# Patient Record
Sex: Male | Born: 1935 | Race: White | Hispanic: No | Marital: Married | Smoking: Former smoker
Health system: Southern US, Community
[De-identification: ages and names within clinical notes are randomized; demographics above are authoritative.]

## PROBLEM LIST (undated history)

## (undated) DIAGNOSIS — Z952 Presence of prosthetic heart valve: Secondary | ICD-10-CM

## (undated) DIAGNOSIS — K219 Gastro-esophageal reflux disease without esophagitis: Secondary | ICD-10-CM

## (undated) DIAGNOSIS — J309 Allergic rhinitis, unspecified: Secondary | ICD-10-CM

## (undated) DIAGNOSIS — E785 Hyperlipidemia, unspecified: Secondary | ICD-10-CM

## (undated) DIAGNOSIS — IMO0001 Reserved for inherently not codable concepts without codable children: Secondary | ICD-10-CM

## (undated) DIAGNOSIS — M949 Disorder of cartilage, unspecified: Secondary | ICD-10-CM

## (undated) DIAGNOSIS — I739 Peripheral vascular disease, unspecified: Secondary | ICD-10-CM

## (undated) DIAGNOSIS — A0472 Enterocolitis due to Clostridium difficile, not specified as recurrent: Secondary | ICD-10-CM

## (undated) DIAGNOSIS — E538 Deficiency of other specified B group vitamins: Secondary | ICD-10-CM

## (undated) DIAGNOSIS — N3091 Cystitis, unspecified with hematuria: Secondary | ICD-10-CM

## (undated) DIAGNOSIS — I6529 Occlusion and stenosis of unspecified carotid artery: Secondary | ICD-10-CM

## (undated) DIAGNOSIS — K449 Diaphragmatic hernia without obstruction or gangrene: Secondary | ICD-10-CM

## (undated) DIAGNOSIS — D649 Anemia, unspecified: Secondary | ICD-10-CM

## (undated) DIAGNOSIS — G459 Transient cerebral ischemic attack, unspecified: Secondary | ICD-10-CM

## (undated) DIAGNOSIS — M899 Disorder of bone, unspecified: Secondary | ICD-10-CM

## (undated) DIAGNOSIS — I10 Essential (primary) hypertension: Secondary | ICD-10-CM

## (undated) DIAGNOSIS — K279 Peptic ulcer, site unspecified, unspecified as acute or chronic, without hemorrhage or perforation: Secondary | ICD-10-CM

## (undated) DIAGNOSIS — B029 Zoster without complications: Secondary | ICD-10-CM

## (undated) DIAGNOSIS — C61 Malignant neoplasm of prostate: Secondary | ICD-10-CM

## (undated) DIAGNOSIS — I639 Cerebral infarction, unspecified: Secondary | ICD-10-CM

## (undated) DIAGNOSIS — F1021 Alcohol dependence, in remission: Secondary | ICD-10-CM

## (undated) DIAGNOSIS — K5730 Diverticulosis of large intestine without perforation or abscess without bleeding: Secondary | ICD-10-CM

## (undated) DIAGNOSIS — R310 Gross hematuria: Secondary | ICD-10-CM

## (undated) DIAGNOSIS — R5084 Febrile nonhemolytic transfusion reaction: Secondary | ICD-10-CM

## (undated) DIAGNOSIS — K649 Unspecified hemorrhoids: Secondary | ICD-10-CM

## (undated) DIAGNOSIS — Z5189 Encounter for other specified aftercare: Secondary | ICD-10-CM

## (undated) DIAGNOSIS — T4145XA Adverse effect of unspecified anesthetic, initial encounter: Secondary | ICD-10-CM

## (undated) DIAGNOSIS — N400 Enlarged prostate without lower urinary tract symptoms: Secondary | ICD-10-CM

## (undated) DIAGNOSIS — I350 Nonrheumatic aortic (valve) stenosis: Secondary | ICD-10-CM

## (undated) DIAGNOSIS — I2510 Atherosclerotic heart disease of native coronary artery without angina pectoris: Secondary | ICD-10-CM

## (undated) HISTORY — DX: Enterocolitis due to Clostridium difficile, not specified as recurrent: A04.72

## (undated) HISTORY — DX: Unspecified hemorrhoids: K64.9

## (undated) HISTORY — DX: Essential (primary) hypertension: I10

## (undated) HISTORY — DX: Occlusion and stenosis of unspecified carotid artery: I65.29

## (undated) HISTORY — DX: Allergic rhinitis, unspecified: J30.9

## (undated) HISTORY — DX: Hyperlipidemia, unspecified: E78.5

## (undated) HISTORY — DX: Presence of prosthetic heart valve: Z95.2

## (undated) HISTORY — DX: Transient cerebral ischemic attack, unspecified: G45.9

## (undated) HISTORY — DX: Peptic ulcer, site unspecified, unspecified as acute or chronic, without hemorrhage or perforation: K27.9

## (undated) HISTORY — DX: Disorder of bone, unspecified: M89.9

## (undated) HISTORY — DX: Diverticulosis of large intestine without perforation or abscess without bleeding: K57.30

## (undated) HISTORY — DX: Peripheral vascular disease, unspecified: I73.9

## (undated) HISTORY — DX: Disorder of cartilage, unspecified: M94.9

## (undated) HISTORY — DX: Alcohol dependence, in remission: F10.21

## (undated) HISTORY — DX: Malignant neoplasm of prostate: C61

## (undated) HISTORY — DX: Gastro-esophageal reflux disease without esophagitis: K21.9

## (undated) HISTORY — DX: Deficiency of other specified B group vitamins: E53.8

## (undated) HISTORY — DX: Anemia, unspecified: D64.9

## (undated) HISTORY — DX: Benign prostatic hyperplasia without lower urinary tract symptoms: N40.0

## (undated) HISTORY — DX: Nonrheumatic aortic (valve) stenosis: I35.0

## (undated) HISTORY — DX: Atherosclerotic heart disease of native coronary artery without angina pectoris: I25.10

## (undated) HISTORY — DX: Zoster without complications: B02.9

## (undated) HISTORY — DX: Diaphragmatic hernia without obstruction or gangrene: K44.9

---

## 1996-01-19 HISTORY — PX: POPLITEAL SYNOVIAL CYST EXCISION: SUR555

## 1997-01-18 HISTORY — PX: CAROTID ENDARTERECTOMY: SUR193

## 1999-01-19 HISTORY — PX: PROSTATE SURGERY: SHX751

## 1999-06-04 ENCOUNTER — Other Ambulatory Visit: Admission: RE | Admit: 1999-06-04 | Discharge: 1999-06-04 | Payer: Self-pay | Admitting: Internal Medicine

## 1999-06-04 ENCOUNTER — Encounter (INDEPENDENT_AMBULATORY_CARE_PROVIDER_SITE_OTHER): Payer: Self-pay

## 1999-07-24 ENCOUNTER — Encounter (INDEPENDENT_AMBULATORY_CARE_PROVIDER_SITE_OTHER): Payer: Self-pay | Admitting: Specialist

## 1999-07-24 ENCOUNTER — Other Ambulatory Visit: Admission: RE | Admit: 1999-07-24 | Discharge: 1999-07-24 | Payer: Self-pay | Admitting: Urology

## 1999-07-31 ENCOUNTER — Encounter: Payer: Self-pay | Admitting: Urology

## 1999-07-31 ENCOUNTER — Encounter: Payer: Self-pay | Admitting: Internal Medicine

## 1999-07-31 ENCOUNTER — Encounter: Admission: RE | Admit: 1999-07-31 | Discharge: 1999-07-31 | Payer: Self-pay | Admitting: Urology

## 1999-08-17 ENCOUNTER — Encounter: Admission: RE | Admit: 1999-08-17 | Discharge: 1999-11-15 | Payer: Self-pay | Admitting: Radiation Oncology

## 1999-09-23 ENCOUNTER — Encounter: Payer: Self-pay | Admitting: Urology

## 1999-09-28 ENCOUNTER — Inpatient Hospital Stay (HOSPITAL_COMMUNITY): Admission: RE | Admit: 1999-09-28 | Discharge: 1999-10-01 | Payer: Self-pay | Admitting: Urology

## 1999-09-28 ENCOUNTER — Encounter (INDEPENDENT_AMBULATORY_CARE_PROVIDER_SITE_OTHER): Payer: Self-pay | Admitting: Specialist

## 2002-01-18 HISTORY — PX: CORONARY ANGIOPLASTY WITH STENT PLACEMENT: SHX49

## 2002-08-21 ENCOUNTER — Encounter: Payer: Self-pay | Admitting: *Deleted

## 2002-08-21 ENCOUNTER — Ambulatory Visit (HOSPITAL_COMMUNITY): Admission: RE | Admit: 2002-08-21 | Discharge: 2002-08-22 | Payer: Self-pay | Admitting: *Deleted

## 2002-11-16 ENCOUNTER — Inpatient Hospital Stay (HOSPITAL_COMMUNITY): Admission: AD | Admit: 2002-11-16 | Discharge: 2002-11-17 | Payer: Self-pay | Admitting: Cardiology

## 2003-03-20 ENCOUNTER — Encounter: Payer: Self-pay | Admitting: Internal Medicine

## 2003-03-27 ENCOUNTER — Encounter: Payer: Self-pay | Admitting: Internal Medicine

## 2003-03-27 ENCOUNTER — Ambulatory Visit (HOSPITAL_COMMUNITY): Admission: RE | Admit: 2003-03-27 | Discharge: 2003-03-27 | Payer: Self-pay | Admitting: Internal Medicine

## 2004-05-27 ENCOUNTER — Ambulatory Visit: Payer: Self-pay | Admitting: Internal Medicine

## 2004-06-08 ENCOUNTER — Ambulatory Visit: Payer: Self-pay

## 2004-11-11 ENCOUNTER — Ambulatory Visit: Admission: RE | Admit: 2004-11-11 | Discharge: 2004-11-23 | Payer: Self-pay | Admitting: Radiation Oncology

## 2004-12-18 ENCOUNTER — Encounter (HOSPITAL_COMMUNITY): Admission: RE | Admit: 2004-12-18 | Discharge: 2005-03-18 | Payer: Self-pay | Admitting: Radiation Oncology

## 2004-12-28 ENCOUNTER — Ambulatory Visit: Admission: RE | Admit: 2004-12-28 | Discharge: 2005-01-12 | Payer: Self-pay | Admitting: Radiation Oncology

## 2005-01-21 ENCOUNTER — Ambulatory Visit: Admission: RE | Admit: 2005-01-21 | Discharge: 2005-03-26 | Payer: Self-pay | Admitting: Radiation Oncology

## 2005-07-06 ENCOUNTER — Ambulatory Visit: Payer: Self-pay | Admitting: Internal Medicine

## 2005-07-20 ENCOUNTER — Ambulatory Visit: Payer: Self-pay

## 2005-07-23 ENCOUNTER — Encounter: Payer: Self-pay | Admitting: Cardiology

## 2005-07-23 ENCOUNTER — Ambulatory Visit: Payer: Self-pay

## 2005-07-24 ENCOUNTER — Ambulatory Visit: Payer: Self-pay | Admitting: Family Medicine

## 2005-07-26 ENCOUNTER — Ambulatory Visit: Payer: Self-pay

## 2005-11-25 ENCOUNTER — Ambulatory Visit: Payer: Self-pay | Admitting: Internal Medicine

## 2005-12-15 ENCOUNTER — Ambulatory Visit: Payer: Self-pay | Admitting: Internal Medicine

## 2006-06-14 ENCOUNTER — Ambulatory Visit: Payer: Self-pay

## 2006-07-08 ENCOUNTER — Ambulatory Visit: Payer: Self-pay | Admitting: Internal Medicine

## 2006-07-08 LAB — CONVERTED CEMR LAB
ALT: 19 units/L (ref 0–40)
AST: 24 units/L (ref 0–37)
Albumin: 3.7 g/dL (ref 3.5–5.2)
Alkaline Phosphatase: 85 units/L (ref 39–117)
BUN: 10 mg/dL (ref 6–23)
Basophils Absolute: 0 10*3/uL (ref 0.0–0.1)
Basophils Relative: 0.1 % (ref 0.0–1.0)
Bilirubin, Direct: 0.1 mg/dL (ref 0.0–0.3)
CO2: 30 meq/L (ref 19–32)
Calcium: 9.4 mg/dL (ref 8.4–10.5)
Chloride: 107 meq/L (ref 96–112)
Cholesterol: 169 mg/dL (ref 0–200)
Creatinine, Ser: 1.1 mg/dL (ref 0.4–1.5)
Eosinophils Absolute: 0.1 10*3/uL (ref 0.0–0.6)
Eosinophils Relative: 2.1 % (ref 0.0–5.0)
GFR calc Af Amer: 83 mL/min
GFR calc non Af Amer: 69 mL/min
Glucose, Bld: 79 mg/dL (ref 70–99)
HCT: 43.4 % (ref 39.0–52.0)
HDL: 50.7 mg/dL (ref >39.0)
Hemoglobin: 14.9 g/dL (ref 13.0–17.0)
LDL Cholesterol: 98 mg/dL (ref 0–99)
Lymphocytes Relative: 23.4 % (ref 12.0–46.0)
MCHC: 34.3 g/dL (ref 30.0–36.0)
MCV: 92.2 fL (ref 78.0–100.0)
Monocytes Absolute: 0.6 10*3/uL (ref 0.2–0.7)
Monocytes Relative: 17.5 % — ABNORMAL HIGH (ref 3.0–11.0)
Neutro Abs: 2 10*3/uL (ref 1.4–7.7)
Neutrophils Relative %: 56.9 % (ref 43.0–77.0)
Platelets: 156 10*3/uL (ref 150–400)
Potassium: 4.3 meq/L (ref 3.5–5.1)
RBC: 4.7 M/uL (ref 4.22–5.81)
RDW: 12.9 % (ref 11.5–14.6)
Sodium: 139 meq/L (ref 135–145)
TSH: 1.45 microintl units/mL (ref 0.35–5.50)
Total Bilirubin: 0.9 mg/dL (ref 0.3–1.2)
Total CHOL/HDL Ratio: 3.3
Total Protein: 7 g/dL (ref 6.0–8.3)
Triglycerides: 101 mg/dL (ref 0–149)
VLDL: 20 mg/dL (ref 0–40)
WBC: 3.5 10*3/uL — ABNORMAL LOW (ref 4.5–10.5)

## 2006-11-09 DIAGNOSIS — E785 Hyperlipidemia, unspecified: Secondary | ICD-10-CM

## 2006-11-09 DIAGNOSIS — I739 Peripheral vascular disease, unspecified: Secondary | ICD-10-CM

## 2006-11-09 DIAGNOSIS — I10 Essential (primary) hypertension: Secondary | ICD-10-CM | POA: Insufficient documentation

## 2006-11-09 DIAGNOSIS — Z8546 Personal history of malignant neoplasm of prostate: Secondary | ICD-10-CM

## 2006-11-09 DIAGNOSIS — I2510 Atherosclerotic heart disease of native coronary artery without angina pectoris: Secondary | ICD-10-CM | POA: Insufficient documentation

## 2007-04-24 ENCOUNTER — Telehealth: Payer: Self-pay | Admitting: Internal Medicine

## 2007-05-31 ENCOUNTER — Encounter: Payer: Self-pay | Admitting: Internal Medicine

## 2007-05-31 ENCOUNTER — Ambulatory Visit: Payer: Self-pay

## 2007-06-02 ENCOUNTER — Ambulatory Visit: Payer: Self-pay | Admitting: Internal Medicine

## 2007-06-02 DIAGNOSIS — R5381 Other malaise: Secondary | ICD-10-CM

## 2007-06-02 DIAGNOSIS — K279 Peptic ulcer, site unspecified, unspecified as acute or chronic, without hemorrhage or perforation: Secondary | ICD-10-CM | POA: Insufficient documentation

## 2007-06-02 DIAGNOSIS — R5383 Other fatigue: Secondary | ICD-10-CM

## 2007-06-02 DIAGNOSIS — M949 Disorder of cartilage, unspecified: Secondary | ICD-10-CM

## 2007-06-02 DIAGNOSIS — Z8679 Personal history of other diseases of the circulatory system: Secondary | ICD-10-CM | POA: Insufficient documentation

## 2007-06-02 DIAGNOSIS — K5730 Diverticulosis of large intestine without perforation or abscess without bleeding: Secondary | ICD-10-CM | POA: Insufficient documentation

## 2007-06-02 DIAGNOSIS — M899 Disorder of bone, unspecified: Secondary | ICD-10-CM | POA: Insufficient documentation

## 2007-06-02 DIAGNOSIS — J309 Allergic rhinitis, unspecified: Secondary | ICD-10-CM | POA: Insufficient documentation

## 2007-06-02 DIAGNOSIS — K219 Gastro-esophageal reflux disease without esophagitis: Secondary | ICD-10-CM

## 2007-06-02 DIAGNOSIS — N400 Enlarged prostate without lower urinary tract symptoms: Secondary | ICD-10-CM

## 2007-06-03 LAB — CONVERTED CEMR LAB
ALT: 16 units/L (ref 0–53)
AST: 22 units/L (ref 0–37)
Albumin: 4.1 g/dL (ref 3.5–5.2)
Alkaline Phosphatase: 89 units/L (ref 39–117)
BUN: 7 mg/dL (ref 6–23)
Basophils Absolute: 0 10*3/uL (ref 0.0–0.1)
Basophils Relative: 1 % (ref 0.0–1.0)
Bilirubin Urine: NEGATIVE
Bilirubin, Direct: 0.2 mg/dL (ref 0.0–0.3)
CO2: 27 meq/L (ref 19–32)
Calcium: 9.5 mg/dL (ref 8.4–10.5)
Chloride: 101 meq/L (ref 96–112)
Cholesterol: 148 mg/dL (ref 0–200)
Creatinine, Ser: 1 mg/dL (ref 0.4–1.5)
Eosinophils Absolute: 0.1 10*3/uL (ref 0.0–0.7)
Eosinophils Relative: 1.9 % (ref 0.0–5.0)
GFR calc Af Amer: 93 mL/min
GFR calc non Af Amer: 77 mL/min
Glucose, Bld: 85 mg/dL (ref 70–99)
HCT: 41.6 % (ref 39.0–52.0)
HDL: 48.6 mg/dL (ref >39.0)
Hemoglobin, Urine: NEGATIVE
Hemoglobin: 14 g/dL (ref 13.0–17.0)
Ketones, ur: NEGATIVE mg/dL
LDL Cholesterol: 78 mg/dL (ref 0–99)
Leukocytes, UA: NEGATIVE
Lymphocytes Relative: 22.2 % (ref 12.0–46.0)
MCHC: 33.7 g/dL (ref 30.0–36.0)
MCV: 93.3 fL (ref 78.0–100.0)
Monocytes Absolute: 0.5 10*3/uL (ref 0.1–1.0)
Monocytes Relative: 14.1 % — ABNORMAL HIGH (ref 3.0–12.0)
Neutro Abs: 1.9 10*3/uL (ref 1.4–7.7)
Neutrophils Relative %: 60.8 % (ref 43.0–77.0)
Nitrite: NEGATIVE
Platelets: 165 10*3/uL (ref 150–400)
Potassium: 4.2 meq/L (ref 3.5–5.1)
RBC: 4.46 M/uL (ref 4.22–5.81)
RDW: 12.8 % (ref 11.5–14.6)
Sodium: 135 meq/L (ref 135–145)
Specific Gravity, Urine: 1.01 (ref 1.000–1.03)
TSH: 0.89 microintl units/mL (ref 0.35–5.50)
Total Bilirubin: 1.1 mg/dL (ref 0.3–1.2)
Total CHOL/HDL Ratio: 3
Total Protein, Urine: NEGATIVE mg/dL
Total Protein: 7.2 g/dL (ref 6.0–8.3)
Triglycerides: 108 mg/dL (ref 0–149)
Urine Glucose: NEGATIVE mg/dL
Urobilinogen, UA: 0.2 (ref 0.0–1.0)
VLDL: 22 mg/dL (ref 0–40)
WBC: 3.2 10*3/uL — ABNORMAL LOW (ref 4.5–10.5)
pH: 7 (ref 5.0–8.0)

## 2007-12-21 ENCOUNTER — Ambulatory Visit: Payer: Self-pay | Admitting: Internal Medicine

## 2008-03-05 ENCOUNTER — Encounter (INDEPENDENT_AMBULATORY_CARE_PROVIDER_SITE_OTHER): Payer: Self-pay | Admitting: *Deleted

## 2008-06-04 ENCOUNTER — Ambulatory Visit: Payer: Self-pay | Admitting: Internal Medicine

## 2008-06-04 DIAGNOSIS — R079 Chest pain, unspecified: Secondary | ICD-10-CM

## 2008-06-05 ENCOUNTER — Ambulatory Visit: Payer: Self-pay

## 2008-06-05 ENCOUNTER — Encounter: Payer: Self-pay | Admitting: Internal Medicine

## 2008-06-05 DIAGNOSIS — E538 Deficiency of other specified B group vitamins: Secondary | ICD-10-CM | POA: Insufficient documentation

## 2008-06-11 ENCOUNTER — Ambulatory Visit: Payer: Self-pay | Admitting: Internal Medicine

## 2008-07-01 ENCOUNTER — Ambulatory Visit: Payer: Self-pay | Admitting: Cardiology

## 2008-07-01 DIAGNOSIS — I359 Nonrheumatic aortic valve disorder, unspecified: Secondary | ICD-10-CM | POA: Insufficient documentation

## 2008-07-09 ENCOUNTER — Ambulatory Visit: Payer: Self-pay

## 2008-07-09 ENCOUNTER — Encounter: Payer: Self-pay | Admitting: Cardiology

## 2008-07-12 ENCOUNTER — Ambulatory Visit: Payer: Self-pay | Admitting: Internal Medicine

## 2008-07-12 ENCOUNTER — Telehealth: Payer: Self-pay | Admitting: Internal Medicine

## 2008-07-19 ENCOUNTER — Telehealth: Payer: Self-pay | Admitting: Cardiology

## 2008-07-31 DIAGNOSIS — K649 Unspecified hemorrhoids: Secondary | ICD-10-CM | POA: Insufficient documentation

## 2008-07-31 DIAGNOSIS — F1021 Alcohol dependence, in remission: Secondary | ICD-10-CM

## 2008-07-31 DIAGNOSIS — Z8711 Personal history of peptic ulcer disease: Secondary | ICD-10-CM

## 2008-07-31 DIAGNOSIS — Z860100 Personal history of colon polyps, unspecified: Secondary | ICD-10-CM | POA: Insufficient documentation

## 2008-07-31 DIAGNOSIS — K449 Diaphragmatic hernia without obstruction or gangrene: Secondary | ICD-10-CM | POA: Insufficient documentation

## 2008-08-06 ENCOUNTER — Ambulatory Visit: Payer: Self-pay | Admitting: Internal Medicine

## 2008-08-06 DIAGNOSIS — C61 Malignant neoplasm of prostate: Secondary | ICD-10-CM

## 2008-08-09 ENCOUNTER — Ambulatory Visit: Payer: Self-pay | Admitting: Internal Medicine

## 2008-09-05 ENCOUNTER — Ambulatory Visit: Payer: Self-pay | Admitting: Cardiology

## 2008-12-31 ENCOUNTER — Telehealth: Payer: Self-pay | Admitting: Internal Medicine

## 2009-01-01 ENCOUNTER — Encounter: Payer: Self-pay | Admitting: Internal Medicine

## 2009-01-20 ENCOUNTER — Ambulatory Visit (HOSPITAL_COMMUNITY): Admission: RE | Admit: 2009-01-20 | Discharge: 2009-01-20 | Payer: Self-pay | Admitting: Cardiology

## 2009-01-20 ENCOUNTER — Encounter: Payer: Self-pay | Admitting: Cardiology

## 2009-01-20 ENCOUNTER — Ambulatory Visit: Payer: Self-pay | Admitting: Cardiology

## 2009-01-20 ENCOUNTER — Ambulatory Visit: Payer: Self-pay

## 2009-01-21 ENCOUNTER — Ambulatory Visit: Payer: Self-pay | Admitting: Cardiology

## 2009-04-16 ENCOUNTER — Ambulatory Visit: Payer: Self-pay | Admitting: Internal Medicine

## 2009-04-16 DIAGNOSIS — G459 Transient cerebral ischemic attack, unspecified: Secondary | ICD-10-CM | POA: Insufficient documentation

## 2009-04-21 ENCOUNTER — Ambulatory Visit (HOSPITAL_COMMUNITY): Admission: RE | Admit: 2009-04-21 | Discharge: 2009-04-21 | Payer: Self-pay | Admitting: Internal Medicine

## 2009-04-29 ENCOUNTER — Encounter: Payer: Self-pay | Admitting: Internal Medicine

## 2009-04-29 DIAGNOSIS — I6529 Occlusion and stenosis of unspecified carotid artery: Secondary | ICD-10-CM

## 2009-04-30 ENCOUNTER — Ambulatory Visit: Payer: Self-pay

## 2009-04-30 ENCOUNTER — Encounter: Payer: Self-pay | Admitting: Internal Medicine

## 2009-05-28 ENCOUNTER — Encounter (INDEPENDENT_AMBULATORY_CARE_PROVIDER_SITE_OTHER): Payer: Self-pay | Admitting: *Deleted

## 2009-06-30 ENCOUNTER — Encounter: Payer: Self-pay | Admitting: Internal Medicine

## 2009-07-22 ENCOUNTER — Encounter: Payer: Self-pay | Admitting: Cardiology

## 2009-07-24 ENCOUNTER — Ambulatory Visit: Payer: Self-pay | Admitting: Cardiology

## 2009-07-25 ENCOUNTER — Ambulatory Visit: Payer: Self-pay | Admitting: Internal Medicine

## 2009-07-25 LAB — CONVERTED CEMR LAB
ALT: 13 units/L (ref 0–53)
AST: 20 units/L (ref 0–37)
Albumin: 4.2 g/dL (ref 3.5–5.2)
Alkaline Phosphatase: 87 units/L (ref 39–117)
BUN: 11 mg/dL (ref 6–23)
Basophils Absolute: 0 10*3/uL (ref 0.0–0.1)
Basophils Relative: 0.5 % (ref 0.0–3.0)
Bilirubin Urine: NEGATIVE
Bilirubin, Direct: 0.2 mg/dL (ref 0.0–0.3)
CO2: 28 meq/L (ref 19–32)
Calcium: 9.6 mg/dL (ref 8.4–10.5)
Chloride: 104 meq/L (ref 96–112)
Cholesterol: 149 mg/dL (ref 0–200)
Creatinine, Ser: 1 mg/dL (ref 0.4–1.5)
Eosinophils Absolute: 0.1 10*3/uL (ref 0.0–0.7)
Eosinophils Relative: 2 % (ref 0.0–5.0)
Folate: 18.8 ng/mL
GFR calc non Af Amer: 75.28 mL/min (ref >60)
Glucose, Bld: 92 mg/dL (ref 70–99)
HCT: 40 % (ref 39.0–52.0)
HDL: 58.8 mg/dL (ref >39.00)
Hemoglobin, Urine: NEGATIVE
Hemoglobin: 13.8 g/dL (ref 13.0–17.0)
Iron: 113 ug/dL (ref 42–165)
Ketones, ur: NEGATIVE mg/dL
LDL Cholesterol: 75 mg/dL (ref 0–99)
Leukocytes, UA: NEGATIVE
Lymphocytes Relative: 16 % (ref 12.0–46.0)
Lymphs Abs: 0.7 10*3/uL (ref 0.7–4.0)
MCHC: 34.4 g/dL (ref 30.0–36.0)
MCV: 93.5 fL (ref 78.0–100.0)
Monocytes Absolute: 0.6 10*3/uL (ref 0.1–1.0)
Monocytes Relative: 14.2 % — ABNORMAL HIGH (ref 3.0–12.0)
Neutro Abs: 2.9 10*3/uL (ref 1.4–7.7)
Neutrophils Relative %: 67.3 % (ref 43.0–77.0)
Nitrite: NEGATIVE
PSA: 0 ng/mL — ABNORMAL LOW (ref 0.10–4.00)
Platelets: 167 10*3/uL (ref 150.0–400.0)
Potassium: 4.8 meq/L (ref 3.5–5.1)
RBC: 4.28 M/uL (ref 4.22–5.81)
RDW: 14.3 % (ref 11.5–14.6)
Saturation Ratios: 31.7 % (ref 20.0–50.0)
Sed Rate: 15 mm/hr (ref 0–22)
Sodium: 139 meq/L (ref 135–145)
Specific Gravity, Urine: 1.02 (ref 1.000–1.030)
TSH: 1.71 microintl units/mL (ref 0.35–5.50)
Total Bilirubin: 1 mg/dL (ref 0.3–1.2)
Total CHOL/HDL Ratio: 3
Total Protein, Urine: NEGATIVE mg/dL
Total Protein: 7.2 g/dL (ref 6.0–8.3)
Transferrin: 254.4 mg/dL (ref 212.0–360.0)
Triglycerides: 74 mg/dL (ref 0.0–149.0)
Urine Glucose: NEGATIVE mg/dL
Urobilinogen, UA: 0.2 (ref 0.0–1.0)
VLDL: 14.8 mg/dL (ref 0.0–40.0)
Vitamin B-12: 572 pg/mL (ref 211–911)
WBC: 4.3 10*3/uL — ABNORMAL LOW (ref 4.5–10.5)
pH: 6 (ref 5.0–8.0)

## 2009-07-26 LAB — CONVERTED CEMR LAB: Vit D, 25-Hydroxy: 35 ng/mL (ref 30–89)

## 2009-07-29 ENCOUNTER — Telehealth (INDEPENDENT_AMBULATORY_CARE_PROVIDER_SITE_OTHER): Payer: Self-pay | Admitting: *Deleted

## 2009-08-01 ENCOUNTER — Encounter: Payer: Self-pay | Admitting: Internal Medicine

## 2009-08-04 ENCOUNTER — Ambulatory Visit: Payer: Self-pay

## 2009-08-04 ENCOUNTER — Encounter: Payer: Self-pay | Admitting: Internal Medicine

## 2010-01-23 ENCOUNTER — Telehealth: Payer: Self-pay | Admitting: Internal Medicine

## 2010-01-23 ENCOUNTER — Ambulatory Visit
Admission: RE | Admit: 2010-01-23 | Discharge: 2010-01-23 | Payer: Self-pay | Source: Home / Self Care | Attending: Internal Medicine | Admitting: Internal Medicine

## 2010-01-27 ENCOUNTER — Ambulatory Visit: Admission: RE | Admit: 2010-01-27 | Discharge: 2010-01-27 | Payer: Self-pay | Source: Home / Self Care

## 2010-01-27 ENCOUNTER — Ambulatory Visit
Admission: RE | Admit: 2010-01-27 | Discharge: 2010-01-27 | Payer: Self-pay | Source: Home / Self Care | Attending: Cardiology | Admitting: Cardiology

## 2010-01-27 ENCOUNTER — Encounter: Payer: Self-pay | Admitting: Cardiology

## 2010-01-27 ENCOUNTER — Ambulatory Visit (HOSPITAL_COMMUNITY)
Admission: RE | Admit: 2010-01-27 | Discharge: 2010-01-27 | Payer: Self-pay | Source: Home / Self Care | Attending: Cardiology | Admitting: Cardiology

## 2010-02-15 LAB — CONVERTED CEMR LAB
ALT: 15 units/L (ref 0–53)
AST: 24 units/L (ref 0–37)
Albumin: 4 g/dL (ref 3.5–5.2)
Alkaline Phosphatase: 97 units/L (ref 39–117)
BUN: 10 mg/dL (ref 6–23)
Basophils Absolute: 0 10*3/uL (ref 0.0–0.1)
Basophils Relative: 0.9 % (ref 0.0–3.0)
Bilirubin, Direct: 0.1 mg/dL (ref 0.0–0.3)
CO2: 30 meq/L (ref 19–32)
Calcium: 9.4 mg/dL (ref 8.4–10.5)
Chloride: 101 meq/L (ref 96–112)
Cholesterol: 153 mg/dL (ref 0–200)
Creatinine, Ser: 0.9 mg/dL (ref 0.4–1.5)
Eosinophils Absolute: 0.1 10*3/uL (ref 0.0–0.7)
Eosinophils Relative: 3.2 % (ref 0.0–5.0)
Folate: >20 ng/mL
GFR calc non Af Amer: 86.24 mL/min (ref >60)
Glucose, Bld: 96 mg/dL (ref 70–99)
HCT: 41.3 % (ref 39.0–52.0)
HDL: 52.2 mg/dL (ref >39.00)
Hemoglobin: 14.5 g/dL (ref 13.0–17.0)
LDL Cholesterol: 79 mg/dL (ref 0–99)
Lymphocytes Relative: 21.6 % (ref 12.0–46.0)
Lymphs Abs: 0.7 10*3/uL (ref 0.7–4.0)
MCHC: 35 g/dL (ref 30.0–36.0)
MCV: 91.9 fL (ref 78.0–100.0)
Monocytes Absolute: 0.5 10*3/uL (ref 0.1–1.0)
Monocytes Relative: 15.8 % — ABNORMAL HIGH (ref 3.0–12.0)
Neutro Abs: 2.1 10*3/uL (ref 1.4–7.7)
Neutrophils Relative %: 58.5 % (ref 43.0–77.0)
Platelets: 153 10*3/uL (ref 150.0–400.0)
Potassium: 4.4 meq/L (ref 3.5–5.1)
RBC: 4.49 M/uL (ref 4.22–5.81)
RDW: 13.1 % (ref 11.5–14.6)
Sed Rate: 17 mm/hr (ref 0–22)
Sodium: 137 meq/L (ref 135–145)
TSH: 1.69 microintl units/mL (ref 0.35–5.50)
Total Bilirubin: 0.9 mg/dL (ref 0.3–1.2)
Total CHOL/HDL Ratio: 3
Total Protein: 7.6 g/dL (ref 6.0–8.3)
Triglycerides: 107 mg/dL (ref 0.0–149.0)
VLDL: 21.4 mg/dL (ref 0.0–40.0)
Vitamin B-12: 167 pg/mL — ABNORMAL LOW (ref 211–911)
WBC: 3.4 10*3/uL — ABNORMAL LOW (ref 4.5–10.5)

## 2010-02-19 NOTE — Miscellaneous (Signed)
  Clinical Lists Changes  Observations: Added new observation of US CAROTID: stable carotid artery disease 0-39% RICA stenosis s/p CEA with DPA Chronic LICA occlusion  f/u 1 year (07/22/2009 14:56) Added new observation of US CAROTID:   stable carotid artery disease 0-39% RICA stenosis s/p CEA with DPA Chronic LICA occlusion  f/u 1 year (04/30/2009 14:58)      Echocardiogram  Procedure date:  01/20/2009  Findings:      - Left ventricle: The cavity size was normal. There was moderate       concentric hypertrophy. Systolic function was normal. The       estimated ejection fraction was in the range of 55% to 60%. Wall       motion was normal; there were no regional wall motion       abnormalities.     - Aortic valve: Valve mobility was restricted. There was moderate       stenosis. Mild regurgitation.     - Mitral valve: Calcified annulus. Mild to moderate regurgitation.     - Left atrium: The atrium was mildly dilated.     - Pulmonary arteries: Systolic pressure was moderately to severely       increased. PA peak pressure: 60mm Hg (S).  Carotid Doppler  Procedure date:  04/30/2009  Findings:        stable carotid artery disease 0-39% RICA stenosis s/p CEA with DPA Chronic LICA occlusion  f/u 1 year

## 2010-02-19 NOTE — Miscellaneous (Signed)
Summary: Orders Update  Clinical Lists Changes  Orders: Added new Test order of Abdominal Aorta Duplex (Abd Aorta Duplex) - Signed 

## 2010-02-19 NOTE — Letter (Signed)
Summary: [facility] Consult Scheduled Letter   [facility]  [address] [place], [place] [address]   Phone: [phone]  Fax: [phone]      05/28/2009 MRN: [id number]  [facility] [address] [place], [place]  [address]    Dear Mr. Kolker,      We have scheduled an appointment for you.  At the recommendation of Dr.John  we have scheduled you a consult with [facility] (Dr Terrace Arabia) on June 1,2011 at 10:15AM.Their phone number is [phone].  If this appointment day and time is not convenient for you, please feel free to call the office of the doctor you are being referred to at the number listed above and reschedule the appointment.  [facility]   [address], [place], [place] [address] phone [phone]   pleas arrive 30 minutes prior your appointment time .  Thank you,  Patient Care Coordinator [facility]

## 2010-02-19 NOTE — Assessment & Plan Note (Signed)
Summary: 6 month rov and 2 d echo  AS/414.01   Visit Type:  Follow-up Primary Provider:  Oliver Barre, MD  CC:  Aortic Stenosis.  History of Present Illness: The patient presents for evaluation of stenosis. He has had moderately severe stenosis with his last echo in 2010. He continues she denies symptoms. In particular he says he can walk his dog quickly without developing any shortness of breath, PND or orthopnea. He has no chest pressure, neck discomfort.  He denies any palpitations, presyncope or syncope. He said no weight gain or edema.  Echodardiogram done today demonstrates significant progression of his valve stenosis. His mean gradient is now 56 mmHg with an area of 0.58.  Current Medications (verified): 1)  Alendronate Sodium 70 Mg Tabs (Alendronate Sodium) .Marland Kitchen.. 1po Q Wkp-- Hold 2)  Diovan 80 Mg  Tabs (Valsartan) .Marland Kitchen.. 1po Once Daily 3)  Lipitor 20 Mg  Tabs (Atorvastatin Calcium) .Marland Kitchen.. 1po Once Daily 4)  Omeprazole 40 Mg Cpdr (Omeprazole) .Marland Kitchen.. 1po Once Daily 5)  Metoprolol Succinate 50 Mg  Tb24 (Metoprolol Succinate) .Marland Kitchen.. 1 By Mouth Once Daily 6)  Folic Acid 400 Mcg Tabs (Folic Acid) .Marland Kitchen.. 1 By Mouth Once Daily 7)  Vitamin B12 Inj .... Every Month 8)  Fish Oil 1000 Mg Caps (Omega-3 Fatty Acids) .Marland Kitchen.. 1 Capsule By Mouth Once Daily 9)  Plavix 75 Mg Tabs (Clopidogrel Bisulfate) .Marland Kitchen.. 1 By Mouth Daily 10)  Cyanocobalamin 1000 Mcg/ml Soln (Cyanocobalamin) .... Inject 1ml Intramuscularly Once A Month 11)  Fexofenadine Hcl 180 Mg Tabs (Fexofenadine Hcl) .Marland Kitchen.. 1 By Mouth Once Daily 12)  Actonel 150 Mg Tabs (Risedronate Sodium) .Marland Kitchen.. 1 By Mouth Once A Month  Allergies (verified): 1)  ! Detrol (Tolterodine Tartrate)  Past History:  Past Medical History: Prostate cancer, hx of - s/p XRT Coronary artery disease (Successful PTCA and stent placement in the first obtuse marginal  branch as described.  A complex bifurcational 95% followed by an 80%  stenosis were both reduced to 0% residual with  TIMI 3 flow.  Patency of the  medial side branch to the obtuse marginal was preserved.  He had 30% stenosis of the proximal circumflex, 40% distal stenosis, 30% proximal LAD stenosis. 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 Baker cyst resected-1998 R Carotid endarterectomy-1999/Left Carotid total chronic occlusion Prostatectomy-2001 Cardiac cath with stent-2004 Colonoscopy-03/27/2003  Review of Systems       As stated in the HPI and negative for all other systems.   Vital Signs:  Patient profile:   77 year old male Height:      71 inches Weight:      190 pounds BMI:  26.60 Pulse rate:   56 / minute Resp:     16 per minute BP sitting:   160 / 98  (right arm)  Vitals Entered By: Marrion Coy, CNA (January 27, 2010 11:54 AM)  Physical Exam  General:  Well developed, well nourished, in no acute distress. Head:  normocephalic and atraumatic Eyes:  PERRLA/EOM intact; conjunctiva and lids normal. Mouth:  Edentulous, othewise unremarkable Neck:  Transmitted systolic murmur Chest Wall:  no deformities or breast masses noted Lungs:  Clear bilaterally to auscultation and percussion. Abdomen:  soft, non-tender, and normal bowel sounds.   Msk:  Back normal, normal gait. Muscle strength and tone normal. Extremities:  no edema, no erythema  Neurologic:  Alert and oriented x 3. Skin:  Intact  without lesions or rashes. Cervical Nodes:  no significant adenopathy Axillary Nodes:  no significant adenopathy Inguinal Nodes:  no significant adenopathy Psych:  Normal affect.   Detailed Cardiovascular Exam  Neck    Carotids: Carotids full and equal bilaterally without bruits.      Neck Veins: Normal, no JVD.    Heart    Inspection: no deformities or lifts noted.      Palpation: normal PMI with no thrills palpable.      Auscultation: S1 and S2 within normal limits, no S3, no S4, 3/6 apical systolic murmur radiating up the aortic outflow tract, no diastolic murmurs.  Vascular    Abdominal Aorta: no palpable masses, pulsations, or audible bruits.      Femoral Pulses: normal femoral pulses bilaterally.      Pedal Pulses: normal pedal pulses bilaterally.      Radial Pulses: normal radial pulses bilaterally.      Peripheral Circulation: no clubbing, cyanosis, or edema noted with normal capillary refill.     Impression & Recommendations:  Problem # 1:  AORTIC VALVE DISORDERS (ICD-424.1) The patient now has critical severe AS.  It is time for him to consider valve surgery.  We talked about this at great lenght. (Greater than 1/2 hour).  He will call me back to discuss.  Problem # 2:  HYPERTENSION (ICD-401.9) His blood pressure is well preserved.  Problem # 3:  CORONARY ARTERY DISEASE (ICD-414.00) He has no ongoing symptoms.  He would have a cardiac cath before valve surgery. Orders: EKG w/ Interpretation (93000)  Patient Instructions: 1)  Your physician recommends that you schedule a follow-up appointment after your heart carth 2)  Your physician recommends that you continue on your current medications as directed. Please refer to the Current Medication list given to you today.

## 2010-02-19 NOTE — Assessment & Plan Note (Signed)
Summary: 6 MTH FU---STC   Vital Signs:  Patient profile:   77 year old male Height:      71 inches Weight:      196 pounds BMI:     27.44 O2 Sat:      98 % on Room air Temp:     97.3 degrees F oral Pulse rate:   61 / minute BP sitting:   132 / 72  (left arm) Cuff size:   regular  Vitals Entered By: Zella Ball Ewing CMA (AAMA) (January 23, 2010 10:20 AM)  O2 Flow:  Room air CC: 6 month followup/RE   Primary Care Provider:  Oliver Barre, MD  CC:  6 month followup/RE.  History of Present Illness: pt here for f/u; here with wife who supplies most of hx, pt states having neck ant soreness/sour brash and uncontrolled reflux symtpoms, without n/v, dysphagia, abd pain, bowel change or blood.  Also with mild nasal allergy symptoms with clearish drainage for several wks, without fever, colored drainage , headache, ear symptoms, but has has slight nonprod cough.  Pt denies CP, worsening sob, doe, wheezing, orthopnea, pnd, worsening LE edema, palps, dizziness or syncope  Pt denies new neuro symptoms such as headache, facial or extremity weakness  Pt denies polydipsia, polyuria, or low sugar symptoms such as shakiness improved with eating.  Overall good compliance with meds, trying to follow low chol, DM diet, wt stable, little excercise however  No overt bleeding or bruising.  No fever, wt loss, night sweats, loss of appetite or other constitutional symptoms Overall good compliance with meds, and good tolerability. Has some general weakness but no falls.  Wants all meds generic if possilble as ins will not pay for brand name.    Problems Prior to Update: 1)  Preventive Health Care  (ICD-V70.0) 2)  Carotid Artery Disease  (ICD-433.10) 3)  Transient Ischemic Attack  (ICD-435.9) 4)  Adenocarcinoma, Prostate  (ICD-185) 5)  Fh of Colon Cancer  (ICD-153.9) 6)  Hx of Hiatal Hernia  (ICD-553.3) 7)  Hemorrhoids  (ICD-455.6) 8)  Pud, Hx of  (ICD-V12.71) 9)  Personal History of Alcoholism  (ICD-V11.3) 10)   Aortic Valve Disorders  (ICD-424.1) 11)  B12 Deficiency  (ICD-266.2) 12)  Chest Pain  (ICD-786.50) 13)  Fatigue  (ICD-780.79) 14)  Osteopenia  (ICD-733.90) 15)  Cerebrovascular Accident, Hx of  (ICD-V12.50) 16)  Peptic Ulcer Disease  (ICD-533.90) 17)  Benign Prostatic Hypertrophy  (ICD-600.00) 18)  Allergic Rhinitis  (ICD-477.9) 19)  Gerd  (ICD-530.81) 20)  Diverticulosis, Colon  (ICD-562.10) 21)  Colonic Polyps, Adenomatous, Hx of  (ICD-V12.72) 22)  Peripheral Vascular Disease  (ICD-443.9) 23)  Hypertension  (ICD-401.9) 24)  Hyperlipidemia  (ICD-272.4) 25)  Coronary Artery Disease  (ICD-414.00) 26)  Prostate Cancer, Hx of  (ICD-V10.46)  Medications Prior to Update: 1)  Actonel 150 Mg Tabs (Risedronate Sodium) .Marland Kitchen.. 1 By Mouth Once Per Month 2)  Diovan 80 Mg  Tabs (Valsartan) .Marland Kitchen.. 1po Once Daily 3)  Lipitor 20 Mg  Tabs (Atorvastatin Calcium) .Marland Kitchen.. 1po Once Daily 4)  Prilosec 20 Mg  Cpdr (Omeprazole) .... Take 1 Tablet By Mouth Once A Day 5)  Metoprolol Succinate 50 Mg  Tb24 (Metoprolol Succinate) .Marland Kitchen.. 1 By Mouth Once Daily 6)  Folic Acid 400 Mcg Tabs (Folic Acid) .Marland Kitchen.. 1 By Mouth Once Daily 7)  Vitamin B12 Inj .... Every Month 8)  Fish Oil 1000 Mg Caps (Omega-3 Fatty Acids) .Marland Kitchen.. 1 Capsule By Mouth Once Daily 9)  Plavix 75  Mg Tabs (Clopidogrel Bisulfate) .Marland Kitchen.. 1 By Mouth Daily 10)  Cyanocobalamin 1000 Mcg/ml Soln (Cyanocobalamin) .... Inject 1ml Intramuscularly Once A Month  Current Medications (verified): 1)  Alendronate Sodium 70 Mg Tabs (Alendronate Sodium) .Marland Kitchen.. 1po Q Wk 2)  Diovan 80 Mg  Tabs (Valsartan) .Marland Kitchen.. 1po Once Daily 3)  Lipitor 20 Mg  Tabs (Atorvastatin Calcium) .Marland Kitchen.. 1po Once Daily 4)  Omeprazole 40 Mg Cpdr (Omeprazole) .Marland Kitchen.. 1po Once Daily 5)  Metoprolol Succinate 50 Mg  Tb24 (Metoprolol Succinate) .Marland Kitchen.. 1 By Mouth Once Daily 6)  Folic Acid 400 Mcg Tabs (Folic Acid) .Marland Kitchen.. 1 By Mouth Once Daily 7)  Vitamin B12 Inj .... Every Month 8)  Fish Oil 1000 Mg Caps (Omega-3 Fatty  Acids) .Marland Kitchen.. 1 Capsule By Mouth Once Daily 9)  Plavix 75 Mg Tabs (Clopidogrel Bisulfate) .Marland Kitchen.. 1 By Mouth Daily 10)  Cyanocobalamin 1000 Mcg/ml Soln (Cyanocobalamin) .... Inject 1ml Intramuscularly Once A Month 11)  Fexofenadine Hcl 180 Mg Tabs (Fexofenadine Hcl) .Marland Kitchen.. 1 By Mouth Once Daily  Allergies (verified): 1)  ! Detrol (Tolterodine Tartrate)  Past History:  Past Surgical History: Last updated: 07/01/2008 Baker cyst resected-1998 R Carotid endarterectomy-1999/Left Carotid total chronic occlusion Prostatectomy-2001 Cardiac cath with stent-2004 Colonoscopy-03/27/2003  Social History: Last updated: 07/25/2009 Married No children Former Smoker Alcohol use-yes Retired Chiropodist Drug use-no  Risk Factors: Smoking Status: quit (06/02/2007)  Past Medical History: Prostate cancer, hx of - s/p XRT Coronary artery disease (Successful PTCA and stent placement in the first obtuse marginal  branch as described.  A complex bifurcational 95% followed by an 80%  stenosis were both reduced to 0% residual with TIMI 3 flow.  Patency of the  medial side branch to the obtuse marginal was preserved.  He had 30% stenosis of the proximal circumflex, 40% distal stenosis, 30% proximal LAD stenosis. 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   Head:   normocephalic and atraumatic.   Eyes:  vision grossly intact, pupils equal, and pupils round.   Ears:  R ear normal and L ear normal.   Nose:  no external deformity and no nasal discharge.   Mouth:  no gingival abnormalities and pharynx pink and moist.   Neck:  supple and no masses.  , nontender Lungs:  normal respiratory effort, R decreased breath sounds, and L decreased breath sounds.   Heart:  normal rate and regular rhythm.   Abdomen:  soft, non-tender, and normal bowel sounds.   Extremities:  no edema, no erythema    Impression & Recommendations:  Problem # 1:  GERD (ICD-530.81)  His updated medication list for this problem includes:    Omeprazole 40 Mg Cpdr (Omeprazole) .Marland Kitchen... 1po once daily uncotnrolled - treat as above, f/u any worsening signs or symptoms   Labs Reviewed: Hgb: 13.8 (07/25/2009)   Hct: 40.0 (07/25/2009)  Problem # 2:  OSTEOPENIA (ICD-733.90)  His updated medication list for this problem includes:    Alendronate Sodium 70 Mg Tabs (Alendronate sodium) .Marland Kitchen... 1po q wk  Orders: T-Bone Densitometry [phone]) dxa also reviewed with pt - to change to generic med from actonel, as well as calcium vit d, excercise  Problem # 3:  ALLERGIC RHINITIS (ICD-477.9)  His updated medication list for this problem includes:    Fexofenadine Hcl 180 Mg Tabs (Fexofenadine hcl) .Marland Kitchen... 1 by mouth once daily to re-start med - treat as above, f/u any worsening signs or symptoms   Problem # 4:  HYPERTENSION (ICD-401.9)  His updated medication list for this problem includes:    Diovan 80 Mg Tabs (Valsartan) .Marland Kitchen... 1po once daily    Metoprolol Succinate 50 Mg Tb24 (Metoprolol succinate) .Marland Kitchen... 1 by mouth once daily  BP today: 132/72 Prior BP: 142/76 (07/25/2009)  Labs Reviewed: K+: 4.8 (07/25/2009) Creat: : 1.0 (07/25/2009)   Chol: 149 (07/25/2009)   HDL: 58.80 (07/25/2009)   LDL: 75 (07/25/2009)   TG: 74.0 (07/25/2009) stable overall by hx and exam, ok to continue meds/tx as is     Complete Medication List: 1)  Alendronate Sodium 70 Mg Tabs (Alendronate sodium) .Marland Kitchen.. 1po q wk 2)  Diovan 80 Mg Tabs (Valsartan) .Marland Kitchen.. 1po once daily 3)  Lipitor 20 Mg Tabs (Atorvastatin calcium) .Marland Kitchen.. 1po once daily 4)  Omeprazole 40 Mg Cpdr (Omeprazole) .Marland Kitchen.. 1po once daily 5)  Metoprolol Succinate 50 Mg Tb24 (Metoprolol succinate) .Marland Kitchen.. 1 by mouth once daily 6)  Folic Acid 400 Mcg Tabs (Folic acid) .Marland Kitchen.. 1 by mouth once daily 7)  Vitamin B12 Inj  .... Every month 8)  Fish Oil 1000 Mg Caps (Omega-3 fatty acids) .Marland Kitchen.. 1 capsule by mouth once daily 9)  Plavix 75 Mg Tabs (Clopidogrel bisulfate) .Marland Kitchen.. 1 by mouth daily 10)  Cyanocobalamin 1000 Mcg/ml Soln (Cyanocobalamin) .... Inject 1ml intramuscularly once a month 11)  Fexofenadine Hcl 180 Mg Tabs (Fexofenadine hcl) .Marland Kitchen.. 1 by mouth once daily  Patient Instructions: 1)  increase the omeprazole to 40 mg 2)  Please take all new medications as prescribed - the generic allegra (fexofenadine) for allergies 3)  stop the actonel 4)  start the fosamax 70 mg per wk 5)  Continue all previous medications as before this visit  6)  Please schedule your bone density before leaving today 7)  Please schedule a follow-up appointment in 6 months - we will need more blood work then 8)  Please keep your appt with Dr Antoine Poche soon as planned Prescriptions: FEXOFENADINE HCL 180 MG TABS (FEXOFENADINE HCL) 1 by mouth once daily  #90 x 3   Entered and Authorized by:   Corwin Levins MD   Signed by:   Corwin Levins MD on 01/23/2010   Method used:   Print then Give to Patient   RxID:   [id number] PLAVIX 75 MG TABS (CLOPIDOGREL BISULFATE) 1 by mouth daily  #90 x 3   Entered and Authorized by:   Corwin Levins MD   Signed by:   Corwin Levins MD on 01/23/2010   Method used:   Print then Give to Patient   RxID:   [id number] FOLIC  ACID 400 MCG TABS (FOLIC ACID) 1 by mouth once daily  #90 x 3   Entered and Authorized by:   Corwin Levins MD   Signed by:   Corwin Levins MD on 01/23/2010   Method used:   Print then Give to Patient   RxID:   [id number] METOPROLOL SUCCINATE 50 MG  TB24 (METOPROLOL SUCCINATE) 1 by mouth once daily  #90 x 3   Entered and Authorized by:   Corwin Levins MD   Signed by:   Corwin Levins MD on 01/23/2010   Method used:   Print then Give to Patient   RxID:   [id number] OMEPRAZOLE 40 MG CPDR (OMEPRAZOLE) 1po once daily  #90 x 3   Entered and Authorized by:   Corwin Levins MD   Signed by:   Corwin Levins MD on 01/23/2010   Method used:   Print then Give to Patient   RxID:   [id number] LIPITOR 20 MG  TABS (ATORVASTATIN CALCIUM) 1po once daily  #90 x 3   Entered and Authorized by:   Corwin Levins MD   Signed by:   Corwin Levins MD on 01/23/2010   Method used:   Print then Give to Patient   RxID:   [id number] DIOVAN 80 MG  TABS (VALSARTAN) 1po once daily  #90 x 3   Entered and Authorized by:   Corwin Levins MD   Signed by:   Corwin Levins MD on 01/23/2010   Method used:   Print then Give to Patient   RxID:   [id number] ALENDRONATE SODIUM 70 MG TABS (ALENDRONATE SODIUM) 1po q wk  #12 x 3   Entered and Authorized by:   Corwin Levins MD   Signed by:   Corwin Levins MD on 01/23/2010   Method used:   Print then Give to Patient   RxID:   [id number]    Orders Added: 1)  T-Bone Densitometry [77080] 2)  Est. Patient Level IV [57322]

## 2010-02-19 NOTE — Miscellaneous (Signed)
Summary: Orders Update  Clinical Lists Changes  Problems: Added new problem of CAROTID ARTERY DISEASE (ICD-433.10) Orders: Added new Test order of Carotid Duplex (Carotid Duplex) - Signed 

## 2010-02-19 NOTE — Assessment & Plan Note (Signed)
Summary: FU / LABS? /NWS   Vital Signs:  Patient profile:   77 year old male Height:      71 inches Weight:      190 pounds BMI:     26.60 O2 Sat:      97 % on Room air Temp:     96.4 degrees F oral Pulse rate:   96 / minute BP sitting:   142 / 76  (left arm) Cuff size:   regular  Vitals Entered By: Bill Salinas CMA (July 25, 2009 10:39 AM)  O2 Flow:  Room air  Preventive Care Screening     declines colonoscopy, but is interested in the dxa  CC: follow-up visit/ ab   Primary Care Provider:  Oliver Barre, MD  CC:  follow-up visit/ ab.  History of Present Illness: here to f/u with wife; now on plavix per Dr Bradd Burner;  did not f/u with the EEG though;  overall doing well, no specific complaints;  Pt denies CP, sob, doe, wheezing, orthopnea, pnd, worsening LE edema, palps, dizziness or syncope  Pt denies new neuro symptoms such as headache, facial or extremity weakness    Here for wellness Diet: Heart Healthy or DM if diabetic Physical Activities: Sedentary Depression/mood screen: Negative Hearing: mod decreased bilat Visual Acuity: Grossly adequate, gets exam yearly ADL's: Capable  Fall Risk: Mild Home Safety: Good Cognitive Impairment:  Gen appearance, affect, speech, memory, attention & motor skills grossly intact End-of-Life Planning: Advance directive - Full code/I agree   Preventive Screening-Counseling & Management      Drug Use:  no.    Problems Prior to Update: 1)  Preventive Health Care  (ICD-V70.0) 2)  Carotid Artery Disease  (ICD-433.10) 3)  Transient Ischemic Attack  (ICD-435.9) 4)  Adenocarcinoma, Prostate  (ICD-185) 5)  Fh of Colon Cancer  (ICD-153.9) 6)  Hx of Hiatal Hernia  (ICD-553.3) 7)  Hemorrhoids  (ICD-455.6) 8)  Pud, Hx of  (ICD-V12.71) 9)  Personal History of Alcoholism  (ICD-V11.3) 10)  Aortic Valve Disorders  (ICD-424.1) 11)  B12 Deficiency  (ICD-266.2) 12)  Chest Pain  (ICD-786.50) 13)  Fatigue  (ICD-780.79) 14)  Osteopenia   (ICD-733.90) 15)  Cerebrovascular Accident, Hx of  (ICD-V12.50) 16)  Peptic Ulcer Disease  (ICD-533.90) 17)  Benign Prostatic Hypertrophy  (ICD-600.00) 18)  Allergic Rhinitis  (ICD-477.9) 19)  Gerd  (ICD-530.81) 20)  Diverticulosis, Colon  (ICD-562.10) 21)  Colonic Polyps, Adenomatous, Hx of  (ICD-V12.72) 22)  Peripheral Vascular Disease  (ICD-443.9) 23)  Hypertension  (ICD-401.9) 24)  Hyperlipidemia  (ICD-272.4) 25)  Coronary Artery Disease  (ICD-414.00) 26)  Prostate Cancer, Hx of  (ICD-V10.46)  Medications Prior to Update: 1)  Actonel 150 Mg Tabs (Risedronate Sodium) .Marland Kitchen.. 1 By Mouth Once Per Month 2)  Diovan 80 Mg  Tabs (Valsartan) .Marland Kitchen.. 1po Once Daily 3)  Lipitor 20 Mg  Tabs (Atorvastatin Calcium) .Marland Kitchen.. 1po Once Daily 4)  Prilosec 20 Mg  Cpdr (Omeprazole) .... Take 1 Tablet By Mouth Once A Day 5)  Metoprolol Succinate 50 Mg  Tb24 (Metoprolol Succinate) .Marland Kitchen.. 1 By Mouth Once Daily 6)  Folic Acid 400 Mcg Tabs (Folic Acid) .Marland Kitchen.. 1 By Mouth Once Daily 7)  Vitamin B12 Inj .... Every Month 8)  Fish Oil 1000 Mg Caps (Omega-3 Fatty Acids) .Marland Kitchen.. 1 Capsule By Mouth Once Daily 9)  Plavix 75 Mg Tabs (Clopidogrel Bisulfate) .Marland Kitchen.. 1 By Mouth Daily  Current Medications (verified): 1)  Actonel 150 Mg Tabs (Risedronate Sodium) .Marland Kitchen.. 1 By  Mouth Once Per Month 2)  Diovan 80 Mg  Tabs (Valsartan) .Marland Kitchen.. 1po Once Daily 3)  Lipitor 20 Mg  Tabs (Atorvastatin Calcium) .Marland Kitchen.. 1po Once Daily 4)  Prilosec 20 Mg  Cpdr (Omeprazole) .... Take 1 Tablet By Mouth Once A Day 5)  Metoprolol Succinate 50 Mg  Tb24 (Metoprolol Succinate) .Marland Kitchen.. 1 By Mouth Once Daily 6)  Folic Acid 400 Mcg Tabs (Folic Acid) .Marland Kitchen.. 1 By Mouth Once Daily 7)  Vitamin B12 Inj .... Every Month 8)  Fish Oil 1000 Mg Caps (Omega-3 Fatty Acids) .Marland Kitchen.. 1 Capsule By Mouth Once Daily 9)  Plavix 75 Mg Tabs (Clopidogrel Bisulfate) .Marland Kitchen.. 1 By Mouth Daily  Allergies (verified): 1)  ! 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  A complex bifurcational 95% followed by an 80%  stenosis were both reduced to 0% residual with TIMI 3 flow.  Patency of the  medial side branch to the obtuse marginal was preserved.  He had 30% stenosis of the proximal circumflex, 40% distal stenosis, 30% proximal LAD stenosis. He had a nondominant right coronary.) Hyperlipidemia Hypertension Peripheral vascular disease - carotid - 100% left occlusion Colonic polyps, hx of ETOH abuse Aortic Stenosis (Moderate - Severe) Diverticulosis, colon GERD Allergic rhinitis Benign prostatic hypertrophy Peptic ulcer disease Cerebrovascular accident, hx of Right eye amourosis fugax Osteopenia Shingles around age 76 yo MD roster:   Dr Bradd Burner                    Dr Hochrein/card                    Dr Davina Poke                    Dr Marlowe Shores                    Dr Tannenbaum/urology  Social History: Reviewed history from 07/01/2008 and no changes required. 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         all otherwise negative per pt -  ecxcept for fatigue s/p recent events, denies worsening depression or OSA symptoms  Physical Exam  General:  alert and well-developed.   Head:  normocephalic and atraumatic.   Eyes:  vision grossly intact, pupils equal, and pupils round.   Ears:  R ear normal and L ear normal.   Nose:  no external deformity and no nasal discharge.   Mouth:  no gingival abnormalities and pharynx pink and moist.   Neck:  supple and no  masses.   Lungs:  normal respiratory effort, R decreased breath sounds, and L decreased breath sounds.   Heart:  normal rate and regular rhythm.   Abdomen:  soft, non-tender, and normal bowel sounds.   Msk:  no joint tenderness and no joint swelling.   Extremities:  no edema, no erythema  Neurologic:  alert & oriented X3 and strength normal in all extremities.   Skin:  color normal and no rashes.   Psych:  not anxious appearing and not depressed appearing.     Impression & Recommendations:  Problem # 1:  Preventive Health Care (ICD-V70.0)  Overall doing well, age appropriate education and counseling updated and referral for appropriate preventive services done unless declined, immunizations up to date or declined, diet counseling done if overweight, urged to quit smoking if smokes , most recent labs reviewed and current ordered if appropriate, ecg reviewed or declined (interpretation per ECG scanned in the EMR if done); information regarding Medicare Prevention requirements given if appropriate; speciality referrals updated as appropriate   Orders: Radiology Referral (Radiology) First annual wellness visit with prevention plan  [phone])  Problem # 2:  FATIGUE (ICD-780.79) exam benign, to check labs  below; follow with expectant management  Orders: T-Vitamin D (25-Hydroxy) (56213-08657) TLB-BMP (Basic Metabolic Panel-BMET) (80048-METABOL) TLB-CBC Platelet - w/Differential (85025-CBCD) TLB-Hepatic/Liver Function Pnl (80076-HEPATIC) TLB-TSH (Thyroid Stimulating Hormone) (84443-TSH) TLB-Sedimentation Rate (ESR) (85652-ESR) TLB-IBC Pnl (Iron/FE;Transferrin) (83550-IBC) TLB-B12 + Folate Pnl (84696_29528-U13/KGM) TLB-Udip ONLY (81003-UDIP)  Problem # 3:  HYPERTENSION (ICD-401.9)  His updated medication list for this problem includes:    Diovan 80 Mg Tabs (Valsartan) .Marland Kitchen... 1po once daily    Metoprolol Succinate 50 Mg Tb24 (Metoprolol succinate) .Marland Kitchen... 1 by mouth once daily  BP today: 142/76 Prior BP: 122/66 (07/24/2009)  Labs Reviewed: K+: 4.4 (06/04/2008) Creat: : 0.9 (06/04/2008)   Chol: 153 (06/04/2008)   HDL: 52.20 (06/04/2008)   LDL: 79 (06/04/2008)   TG: 107.0 (06/04/2008) stable overall by hx and exam, ok to continue meds/tx as is   Problem # 4:  HYPERLIPIDEMIA (ICD-272.4)  His updated medication list for this problem includes:    Lipitor 20 Mg Tabs (Atorvastatin calcium) .Marland Kitchen... 1po once daily  Orders: TLB-Lipid Panel (80061-LIPID)  Labs Reviewed: SGOT: 24 (06/04/2008)   SGPT: 15 (06/04/2008)   HDL:52.20 (06/04/2008), 48.6 (06/02/2007)  LDL:79 (06/04/2008), 78 (06/02/2007)  Chol:153 (06/04/2008), 148 (06/02/2007)  Trig:107.0 (06/04/2008), 108 (06/02/2007) stable overall by hx and exam, ok to continue meds/tx as is , Pt to continue diet efforts, good med tolerance; to check labs - goal LDL less than 70   Problem # 5:  OSTEOPENIA (ICD-733.90)  His updated medication list for this problem includes:    Actonel 150 Mg Tabs (Risedronate sodium) .Marland Kitchen... 1 by mouth once per month  Orders: T-Bone Densitometry [id number]) due for dxa, good med compliance and tolerance;  Continue all previous medications as before this visit   Complete Medication List: 1)  Actonel 150 Mg Tabs  (Risedronate sodium) .Marland Kitchen.. 1 by mouth once per month 2)  Diovan 80 Mg Tabs (Valsartan) .Marland Kitchen.. 1po once daily 3)  Lipitor 20 Mg Tabs (Atorvastatin calcium) .Marland Kitchen.. 1po once daily 4)  Prilosec 20 Mg Cpdr (Omeprazole) .... Take 1 tablet by mouth once a day 5)  Metoprolol Succinate 50 Mg Tb24 (Metoprolol succinate) .Marland Kitchen.. 1 by mouth once daily 6)  Folic Acid 400 Mcg Tabs (Folic acid) .Marland Kitchen.. 1 by mouth once daily 7)  Vitamin B12 Inj  .... Every month 8)  Fish Oil 1000  Mg Caps (Omega-3 fatty acids) .Marland Kitchen.. 1 capsule by mouth once daily 9)  Plavix 75 Mg Tabs (Clopidogrel bisulfate) .Marland Kitchen.. 1 by mouth daily  Other Orders: TLB-PSA (Prostate Specific Antigen) (84153-PSA) Prescription Created Electronically [phone])  Patient Instructions: 1)  Please go to the Lab in the basement for your blood and/or urine tests today 2)  please continue your monthly B12 shots 3)  please schedule the bone density before leaving today 4)  You will be contacted about the referral(s) to: aortic ultrasound to make sure no anuerysm 5)  Continue all previous medications as before this visit  6)  Please schedule a follow-up appointment in 6 months. Prescriptions: PLAVIX 75 MG TABS (CLOPIDOGREL BISULFATE) 1 by mouth daily  #90 x 3   Entered and Authorized by:   Corwin Levins MD   Signed by:   Corwin Levins MD on 07/25/2009   Method used:   Print then Give to Patient   RxID:   [id number] PRILOSEC 20 MG  CPDR (OMEPRAZOLE) Take 1 tablet by mouth once a day  #90 x 3   Entered and Authorized by:   Corwin Levins MD   Signed by:   Corwin Levins MD on 07/25/2009   Method used:   Print then Give to Patient   RxID:   [id number]    Immunization History:  Influenza Immunization History:    Influenza:  historical (10/18/2008)

## 2010-02-19 NOTE — Assessment & Plan Note (Signed)
Summary: MARCH 28 PT COULDN'T SPEAK/ THINGS WERE MIXED UP/BLURRED VISI...   Vital Signs:  Patient profile:   77 year old male Height:      71 inches Weight:      185 pounds BMI:     25.90 O2 Sat:      98 % on Room air Temp:     97.8 degrees F oral Pulse rate:   53 / minute BP sitting:   112 / 62  (left arm) Cuff size:   regular  Vitals Entered ByZella Ball Ewing (April 16, 2009 3:19 PM)  O2 Flow:  Room air CC: left leg weakness, headache, labs/RE   Primary Care Provider:  Oliver Barre, MD  CC:  left leg weakness, headache, and labs/RE.  History of Present Illness: here wih 40 min episode 2 days ago (mon am, wife witnessed)  headache, blurred vision,  hard to find worse (although he could speak) and mild LLE weakness without pain or fall, all of which resolved about the same time.  Did have cataract surgury 3 wks ago and plans further cataract surgury to right eye soon.  Symptoms above somewhat similar to stroke symptoms 1992.  Wife called here and he/she was instructed to go to ER , but as the symptoms resolved, they did not.  Since then has not had any further symptoms and today was the first appt they could get.  No prior hx of TIA.  + hx of CVa 1992, has mult Risk factors relatively well controlled recently, has known hx of left carotid 100% stenosis and planned carotid f/u for may 2011;  has known mod AS but no afib or other arrythmia.  no prior other known CNS issue or siezure, although did have right amurosis fugax.  Had 2d echo done jan 2011.  Pt denies CP, sob, doe, wheezing, orthopnea, pnd, worsening LE edema, palps, dizziness or syncope   Problems Prior to Update: 1)  Transient Ischemic Attack  (ICD-435.9) 2)  Adenocarcinoma, Prostate  (ICD-185) 3)  Fh of Colon Cancer  (ICD-153.9) 4)  Hx of Hiatal Hernia  (ICD-553.3) 5)  Hemorrhoids  (ICD-455.6) 6)  Pud, Hx of  (ICD-V12.71) 7)  Personal History of Alcoholism  (ICD-V11.3) 8)  Aortic Valve Disorders  (ICD-424.1) 9)  B12  Deficiency  (ICD-266.2) 10)  Chest Pain  (ICD-786.50) 11)  Fatigue  (ICD-780.79) 12)  Osteopenia  (ICD-733.90) 13)  Cerebrovascular Accident, Hx of  (ICD-V12.50) 14)  Peptic Ulcer Disease  (ICD-533.90) 15)  Benign Prostatic Hypertrophy  (ICD-600.00) 16)  Allergic Rhinitis  (ICD-477.9) 17)  Gerd  (ICD-530.81) 18)  Diverticulosis, Colon  (ICD-562.10) 19)  Colonic Polyps, Adenomatous, Hx of  (ICD-V12.72) 20)  Peripheral Vascular Disease  (ICD-443.9) 21)  Hypertension  (ICD-401.9) 22)  Hyperlipidemia  (ICD-272.4) 23)  Coronary Artery Disease  (ICD-414.00) 24)  Prostate Cancer, Hx of  (ICD-V10.46)  Medications Prior to Update: 1)  Actonel 150 Mg Tabs (Risedronate Sodium) .Marland Kitchen.. 1 By Mouth Once Per Month 2)  Diovan 80 Mg  Tabs (Valsartan) .Marland Kitchen.. 1po Once Daily 3)  Lipitor 20 Mg  Tabs (Atorvastatin Calcium) .Marland Kitchen.. 1po Once Daily 4)  Prilosec 20 Mg  Cpdr (Omeprazole) .... Take 1 Tablet By Mouth Once A Day 5)  Metoprolol Succinate 50 Mg  Tb24 (Metoprolol Succinate) .Marland Kitchen.. 1 By Mouth Once Daily 6)  Ecotrin 325 Mg  Tbec (Aspirin) .Marland Kitchen.. 1po Qd 7)  Folic Acid 400 Mcg Tabs (Folic Acid) .Marland Kitchen.. 1 By Mouth Once Daily 8)  Vitamin B12 Inj .Marland KitchenMarland KitchenMarland Kitchen  Every Month 9)  Fish Oil 1000 Mg Caps (Omega-3 Fatty Acids) .Marland Kitchen.. 1 Capsule By Mouth Once Daily  Current Medications (verified): 1)  Actonel 150 Mg Tabs (Risedronate Sodium) .Marland Kitchen.. 1 By Mouth Once Per Month 2)  Diovan 80 Mg  Tabs (Valsartan) .Marland Kitchen.. 1po Once Daily 3)  Lipitor 20 Mg  Tabs (Atorvastatin Calcium) .Marland Kitchen.. 1po Once Daily 4)  Prilosec 20 Mg  Cpdr (Omeprazole) .... Take 1 Tablet By Mouth Once A Day 5)  Metoprolol Succinate 50 Mg  Tb24 (Metoprolol Succinate) .Marland Kitchen.. 1 By Mouth Once Daily 6)  Ecotrin 325 Mg  Tbec (Aspirin) .Marland Kitchen.. 1po Qd 7)  Folic Acid 400 Mcg Tabs (Folic Acid) .Marland Kitchen.. 1 By Mouth Once Daily 8)  Vitamin B12 Inj .... Every Month 9)  Fish Oil 1000 Mg Caps (Omega-3 Fatty Acids) .Marland Kitchen.. 1 Capsule By Mouth Once Daily 10)  Aggrenox 25-200 Mg Xr12h-Cap  (Aspirin-Dipyridamole) .Marland Kitchen.. 1 By Mouth Two Times A Day  Allergies (verified): 1)  ! Detrol (Tolterodine Tartrate)  Past History:  Past Medical History: Last updated: 01/21/2009 Prostate cancer, hx of - s/p XRT Coronary artery disease (Successful PTCA and stent placement in the first obtuse marginal  branch as described.  A complex bifurcational 95% followed by an 80%  stenosis were both reduced to 0% residual with TIMI 3 flow.  Patency of the  medial side branch to the obtuse marginal was preserved.  He had 30% stenosis of the proximal circumflex, 40% distal stenosis, 30% proximal LAD stenosis. He had a nondominant right coronary.) Hyperlipidemia Hypertension Peripheral vascular disease - carotid - 100% left occlusion Colonic polyps, hx of ETOH abuse Aortic Stenosis (Moderate - Severe) Diverticulosis, colon GERD Allergic rhinitis Benign prostatic hypertrophy Peptic ulcer disease Cerebrovascular accident, hx of Right eye amourosis fugax Osteopenia Shingles around age 70 yo  Past Surgical History: Last updated: 07/01/2008 Baker cyst resected-1998 R Carotid endarterectomy-1999/Left Carotid total chronic occlusion Prostatectomy-2001 Cardiac cath with stent-2004 Colonoscopy-03/27/2003  Social History: Last updated: 07/01/2008 Married No children Former Smoker 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   Head:  normocephalic and atraumatic.   Eyes:  vision grossly intact, pupils equal, and pupils round.   Ears:  R ear normal and L ear normal.   Nose:  no external deformity and no nasal discharge.   Mouth:  no gingival abnormalities and pharynx pink and moist.   Neck:  supple and no masses.   Lungs:  normal respiratory effort and normal breath sounds.   Heart:  normal rate and regular rhythm.     Abdomen:  soft, non-tender, and normal bowel sounds.   Msk:  no joint tenderness and no joint swelling.   Extremities:  no edema, no erythema  Neurologic:  cranial nerves II-XII intact, strength normal in all extremities, sensation intact to light touch, and DTRs symmetrical and normal.     Impression & Recommendations:  Problem # 1:  TRANSIENT ISCHEMIC ATTACK (ICD-435.9)  His updated medication list for this problem includes:    Ecotrin 325 Mg Tbec (Aspirin) .Marland Kitchen... 1po qd    Aggrenox 25-200 Mg Xr12h-cap (Aspirin-dipyridamole) .Marland Kitchen... 1 by mouth two times a day highly likely given the hx, now resolved and stable for approx 48 hrs;  will not refer to be hosp'd at this point;  to start aggrenox (with tylenol for the  first wk), stop asa, to order MRI head (no CM), carotid dopplers (due may 2011 anyway);  just had echo jan 2011 and reviewed with pt; ecg reviewed today as well ;  also refer to Neurology for evaluation as well  Orders: EKG w/ Interpretation (93000) Radiology Referral (Radiology) Radiology Referral (Radiology) Neurology Referral (Neuro)  Problem # 2:  HYPERTENSION (ICD-401.9)  His updated medication list for this problem includes:    Diovan 80 Mg Tabs (Valsartan) .Marland Kitchen... 1po once daily    Metoprolol Succinate 50 Mg Tb24 (Metoprolol succinate) .Marland Kitchen... 1 by mouth once daily  BP today: 112/62 Prior BP: 160/80 (01/21/2009)  Labs Reviewed: K+: 4.4 (06/04/2008) Creat: : 0.9 (06/04/2008)   Chol: 153 (06/04/2008)   HDL: 52.20 (06/04/2008)   LDL: 79 (06/04/2008)   TG: 107.0 (06/04/2008) stable overall by hx and exam, ok to continue meds/tx as is   Problem # 3:  HYPERLIPIDEMIA (ICD-272.4)  His updated medication list for this problem includes:    Lipitor 20 Mg Tabs (Atorvastatin calcium) .Marland Kitchen... 1po once daily  Labs Reviewed: SGOT: 24 (06/04/2008)   SGPT: 15 (06/04/2008)   HDL:52.20 (06/04/2008), 48.6 (06/02/2007)  LDL:79 (06/04/2008), 78 (06/02/2007)  Chol:153 (06/04/2008), 148  (06/02/2007)  Trig:107.0 (06/04/2008), 108 (06/02/2007) stable overall by hx and exam, ok to continue meds/tx as is   Complete Medication List: 1)  Actonel 150 Mg Tabs (Risedronate sodium) .Marland Kitchen.. 1 by mouth once per month 2)  Diovan 80 Mg Tabs (Valsartan) .Marland Kitchen.. 1po once daily 3)  Lipitor 20 Mg Tabs (Atorvastatin calcium) .Marland Kitchen.. 1po once daily 4)  Prilosec 20 Mg Cpdr (Omeprazole) .... Take 1 tablet by mouth once a day 5)  Metoprolol Succinate 50 Mg Tb24 (Metoprolol succinate) .Marland Kitchen.. 1 by mouth once daily 6)  Ecotrin 325 Mg Tbec (Aspirin) .Marland Kitchen.. 1po qd 7)  Folic Acid 400 Mcg Tabs (Folic acid) .Marland Kitchen.. 1 by mouth once daily 8)  Vitamin B12 Inj  .... Every month 9)  Fish Oil 1000 Mg Caps (Omega-3 fatty acids) .Marland Kitchen.. 1 capsule by mouth once daily 10)  Aggrenox 25-200 Mg Xr12h-cap (Aspirin-dipyridamole) .Marland Kitchen.. 1 by mouth two times a day  Patient Instructions: 1)  stop the aspirin 2)  start the aggrenox as prescribed twice per day 3)  please take tylenol 500 mg (or close to it) twice per day with the aggrenox for the first wk to avoid the headache that sometimes occurs when starting the aggrenox 4)  You will be contacted about the referral(s) to: MRI head, carotid dopplers, and Neurology 5)  Continue all previous medications as before this visit  6)  Please keep also your appt with Dr Antoine Poche as planned 7)  Your EKG was stable today Prescriptions: AGGRENOX 25-200 MG XR12H-CAP (ASPIRIN-DIPYRIDAMOLE) 1 by mouth two times a day  #60 x 11   Entered and Authorized by:   Corwin Levins MD   Signed by:   Corwin Levins MD on 04/16/2009   Method used:   Print then Give to Patient   RxID:   [id number]

## 2010-02-19 NOTE — Progress Notes (Signed)
  Phone Note Call from Patient Call back at [facility] Phone [phone]   Caller: Patient Summary of Call: Pt's spouse called requesting pt start on Actonel once month rather than Fosamax weekly. Spouse is requesting this so she will be on the same schedule as spouse and therefore be sure he gets his medicaine. Spouse states pt is difficult about taking his medicine and this will make it easier for her to help him keep up, please advise. Initial call taken by: Margaret Pyle, CMA,  January 23, 2010 4:18 PM  Follow-up for Phone Call        ok to change - to robin to handle, will need 90 day rx actonel 150 mg q mo Follow-up by: Corwin Levins MD,  January 23, 2010 7:56 PM    New/Updated Medications: ACTONEL 150 MG TABS (RISEDRONATE SODIUM) 1 by mouth once a month Prescriptions: ACTONEL 150 MG TABS (RISEDRONATE SODIUM) 1 by mouth once a month  #12 x 3   Entered by:   Scharlene Gloss CMA (AAMA)   Authorized by:   Corwin Levins MD   Signed by:   Scharlene Gloss CMA (AAMA) on 01/26/2010   Method used:   Faxed to ...       [facility]  [address]  [phone]* (retail)       [address]       [place], [place]  [address]       Ph: [phone] or [phone]       Fax: [phone]   RxID:   [id number]

## 2010-02-19 NOTE — Assessment & Plan Note (Signed)
Summary: 6 month rov AS/PVD  pfh,rn   Visit Type:  Follow-up Primary Provider:  Oliver Barre, MD  CC:  CAD/AS/PVD.  History of Present Illness: The patient presents for followup. Since I last saw him he did have an episode of difficulty talking and was referred to a neurologist. Workup thus far has been negative but is ongoing. He does not report any further neurologic symptoms other than a slowly decreasing memory. He had his echocardiogram last in January with moderately severe land elevated pulmonary artery pressures (severe).  However, he denies any symptoms. He may get dyspneic when walking the dogs in 90 weather. Otherwise he reports no shortness of breath, PND or orthopnea. He denies chest pressure, neck or arm discomfort. He does not report palpitations, presyncope or syncope. He has no weight gain or edema.  Current Medications (verified): 1)  Actonel 150 Mg Tabs (Risedronate Sodium) .Marland Kitchen.. 1 By Mouth Once Per Month 2)  Diovan 80 Mg  Tabs (Valsartan) .Marland Kitchen.. 1po Once Daily 3)  Lipitor 20 Mg  Tabs (Atorvastatin Calcium) .Marland Kitchen.. 1po Once Daily 4)  Prilosec 20 Mg  Cpdr (Omeprazole) .... Take 1 Tablet By Mouth Once A Day 5)  Metoprolol Succinate 50 Mg  Tb24 (Metoprolol Succinate) .Marland Kitchen.. 1 By Mouth Once Daily 6)  Folic Acid 400 Mcg Tabs (Folic Acid) .Marland Kitchen.. 1 By Mouth Once Daily 7)  Vitamin B12 Inj .... Every Month 8)  Fish Oil 1000 Mg Caps (Omega-3 Fatty Acids) .Marland Kitchen.. 1 Capsule By Mouth Once Daily 9)  Plavix 75 Mg Tabs (Clopidogrel Bisulfate) .Marland Kitchen.. 1 By Mouth Daily  Allergies (verified): 1)  ! Detrol (Tolterodine Tartrate)  Past History:  Past Medical History: Reviewed history from 01/21/2009 and no changes required. Prostate cancer, hx of - s/p XRT Coronary artery disease (Successful PTCA and stent placement in the first obtuse marginal  branch as described.  A complex bifurcational 95% followed by an 80%  stenosis were both reduced to 0% residual with TIMI 3 flow.  Patency of the  medial side  branch to the obtuse marginal was preserved.  He had 30% stenosis of the proximal circumflex, 40% distal stenosis, 30% proximal LAD stenosis. He had a nondominant right coronary.) Hyperlipidemia Hypertension Peripheral vascular disease - carotid - 100% left occlusion Colonic polyps, hx of ETOH abuse Aortic Stenosis (Moderate - Severe) Diverticulosis, colon GERD Allergic rhinitis Benign prostatic hypertrophy Peptic ulcer disease Cerebrovascular accident, hx of Right eye amourosis fugax Osteopenia Shingles around age 44 yo  Past Surgical History: Reviewed history from 07/01/2008 and no changes required. Baker cyst resected-1998 R Carotid endarterectomy-1999/Left Carotid total chronic occlusion Prostatectomy-2001 Cardiac cath with stent-2004 Colonoscopy-03/27/2003  Review of Systems       As stated in the HPI and negative for all other systems.   Vital Signs:  Patient profile:   77 year old male Height:      71 inches Weight:      189 pounds BMI:     26.46 Pulse rate:   64 / minute Resp:     16 per minute BP sitting:   122 / 66  (right arm)  Vitals Entered By: Marrion Coy, CNA (July 24, 2009 10:36 AM)  Physical Exam  General:  Well developed, well nourished, in no acute distress. Head:  normocephalic and atraumatic Eyes:  PERRLA/EOM intact; conjunctiva and lids normal. Mouth:  Edentulous. Otherwise unremarkable. Neck:  right carotid endarterectomy scar, bilateral carotid bruits, no thyromegaly Chest Wall:  no deformities or breast masses noted Lungs:  Clear bilaterally to auscultation and percussion.   Detailed Cardiovascular Exam  Neck    Neck Veins: Normal, no JVD.    Heart    Inspection: no deformities or lifts noted.      Palpation: normal PMI with no thrills palpable.      Auscultation: S1 and S2 within normal limits, no S3, no S4, 3/6 apical systolic murmur radiating up the aortic outflow tract, no diastolic murmurs.  Vascular    Abdominal Aorta: no  palpable masses, pulsations, or audible bruits.      Femoral Pulses: normal femoral pulses bilaterally.      Pedal Pulses: normal pedal pulses bilaterally.      Radial Pulses: normal radial pulses bilaterally.      Peripheral Circulation: no clubbing, cyanosis, or edema noted with normal capillary refill.     EKG  Procedure date:  07/24/2009  Findings:      Sinus rhythm, rate 64, axis within normal limits, intervals within normal limits, no acute ST-T wave changes  Impression & Recommendations:  Problem # 1:  AORTIC VALVE DISORDERS (ICD-424.1) Hno new symptoms. I will repeat an echocardiogram in January of next year. We have discussed the symptoms that would occur was this to worsen in the interim.  Orders: Echocardiogram (Echo)  Problem # 2:  CAROTID ARTERY DISEASE (ICD-433.10) . He has stable nonobstructive disease on the right. He will be followed in one year and continue with risk reduction.  Problem # 3:  CORONARY ARTERY DISEASE (ICD-414.00) He has no new symptoms and will continue with meds as listed and risk reduction. Orders: EKG w/ Interpretation (93000)  Problem # 4:  HYPERLIPIDEMIA (ICD-272.4) He is due to have his lipids checked tomorrow with a goal LDL less than 70 and HDL greater than 40.  Patient Instructions: 1)  Your physician recommends that you schedule a follow-up appointment in: 6 months witht Dr Antoine Poche and a 2DEcho 2)  Your physician recommends that you continue on your current medications as directed. Please refer to the Current Medication list given to you today. 3)  Your physician has requested that you have an echocardiogram.  Echocardiography is a painless test that uses sound waves to create images of your heart. It provides your doctor with information about the size and shape of your heart and how well your heart's chambers and valves are working.  This procedure takes approximately one hour. There are no restrictions for this procedure.  Echo due  01/2010

## 2010-02-19 NOTE — Assessment & Plan Note (Signed)
Summary: PER CHECK OUT/NEED ECHO SAME DAY/SAF   Visit Type:  Follow-up Primary Provider:  Oliver Barre, MD  CC:  AS and CAD.  History of Present Illness: The patient presents for followup of aortic stenosis. He has been reluctant to consider valve replacement but is not absolutely against the idea. Yesterday I repeated an echocardiogram to see if he had had progression of disease. His mean gradient was listed at 37 and the left ear described as moderate. He does have fairly high pulmonary pressures. However, this gradient was lower than previously recorded. He continues to report that he has no symptoms. He can walk and actually has a KUB that he hikes to and explores and has done this fairly recently without any dyspnea. He has no resting shortness of breath and denies any PND or orthopnea. He has no palpitations, presyncope or syncope. He has no chest pressure, neck or arm discomfort.  Current Medications (verified): 1)  Actonel 150 Mg Tabs (Risedronate Sodium) .Marland Kitchen.. 1 By Mouth Once Per Month 2)  Diovan 80 Mg  Tabs (Valsartan) .Marland Kitchen.. 1po Once Daily 3)  Lipitor 20 Mg  Tabs (Atorvastatin Calcium) .Marland Kitchen.. 1po Once Daily 4)  Prilosec 20 Mg  Cpdr (Omeprazole) .... Take 1 Tablet By Mouth Once A Day 5)  Metoprolol Succinate 50 Mg  Tb24 (Metoprolol Succinate) .Marland Kitchen.. 1 By Mouth Once Daily 6)  Ecotrin 325 Mg  Tbec (Aspirin) .Marland Kitchen.. 1po Qd 7)  Folic Acid 400 Mcg Tabs (Folic Acid) .Marland Kitchen.. 1 By Mouth Once Daily 8)  Vitamin B12 Inj .... Every Month 9)  Fish Oil 1000 Mg Caps (Omega-3 Fatty Acids) .Marland Kitchen.. 1 Capsule By Mouth Once Daily  Allergies: 1)  ! Detrol (Tolterodine Tartrate)  Past History:  Past Medical History: Prostate cancer, hx of - s/p XRT Coronary artery disease (Successful PTCA and stent placement in the first obtuse marginal  branch as described.  A complex bifurcational 95% followed by an 80%  stenosis were both reduced to 0% residual with TIMI 3 flow.  Patency of the  medial side branch to the obtuse  marginal was preserved.  He had 30% stenosis of the proximal circumflex, 40% distal stenosis, 30% proximal LAD stenosis. He had a nondominant right coronary.) Hyperlipidemia Hypertension Peripheral vascular disease - carotid - 100% left occlusion Colonic polyps, hx of ETOH abuse Aortic Stenosis (Moderate - Severe) Diverticulosis, colon GERD Allergic rhinitis Benign prostatic hypertrophy Peptic ulcer disease Cerebrovascular accident, hx of Right eye amourosis fugax Osteopenia Shingles around age 33 yo  Past Surgical History: Reviewed history from 07/01/2008 and no changes required. Baker cyst resected-1998 R Carotid endarterectomy-1999/Left Carotid total chronic occlusion Prostatectomy-2001 Cardiac cath with stent-2004 Colonoscopy-03/27/2003  Review of Systems       As stated in the HPI and negative for all other systems.   Vital Signs:  Patient profile:   77 year old male Height:      71 inches Weight:      186 pounds BMI:     26.04 Pulse rate:   64 / minute Resp:     14 per minute BP sitting:   160 / 80  (left arm)  Vitals Entered By: Kem Parkinson (January 21, 2009 3:18 PM)  Physical Exam  General:  Well developed, well nourished, in no acute distress. Head:  normocephalic and atraumatic Eyes:  PERRLA/EOM intact; conjunctiva and lids normal. Mouth:  Teeth, gums and palate normal. Oral mucosa normal. Neck:  Neck supple, no JVD. No masses, thyromegaly or abnormal cervical nodes,  transmitted systolic murmur Chest Wall:  no deformities or breast masses noted Lungs:  Clear bilaterally to auscultation and percussion. Abdomen:  Bowel sounds positive; abdomen soft and non-tender without masses, organomegaly, or hernias noted. No hepatosplenomegaly. Msk:  Back normal, normal gait. Muscle strength and tone normal. Skin:  Intact without lesions or rashes. Cervical Nodes:  no significant adenopathy Axillary Nodes:  no significant adenopathy Inguinal Nodes:  no  significant adenopathy Psych:  Normal affect.   Detailed Cardiovascular Exam  Neck    Carotids: Carotids full and equal bilaterally without bruits.      Neck Veins: Normal, no JVD.    Heart    Inspection: no deformities or lifts noted.      Palpation: normal PMI with no thrills palpable.      Auscultation: 3/6 systolic murmur radiating slightly at the aortic outflow tract, no diastolic murmurs.  Vascular    Abdominal Aorta: no palpable masses, pulsations, or audible bruits.      Femoral Pulses: normal femoral pulses bilaterally.      Pedal Pulses: pulses normal in all 4 extremities    Radial Pulses: normal radial pulses bilaterally.      Peripheral Circulation: no clubbing, cyanosis, or edema noted with normal capillary refill.     EKG  Procedure date:  01/21/2009  Findings:      sinus rhythm, rate 64, axis within normal limits, intervals within normal limits, no acute ST-T wave changes.  Impression & Recommendations:  Problem # 1:  AORTIC VALVE DISORDERS (ICD-424.1) He continues to deny any symptoms. His AS is not worse by echo or exam. Therefore, we will continue to follow this.  Problem # 2:  PERIPHERAL VASCULAR DISEASE (ICD-443.9) He is up-to-date with carotid followup and will have another Doppler in May.  Problem # 3:  HYPERTENSION (ICD-401.9) His blood pressure is slightly elevated. However, at home is not elevated. I reviewed previous readings and his primary care office in todays lobation is an aberration. Therefore, I will make no change in his medications.  Problem # 4:  HYPERLIPIDEMIA (ICD-272.4) I reviewed his lipids from May. His HDL was 52.2 with an LDL of 79. He'll continue on the meds as listed.  Problem # 5:  CORONARY ARTERY DISEASE (ICD-414.00) He  will continue with risk reduction. Orders: EKG w/ Interpretation (93000)  Patient Instructions: 1)  Your physician recommends that you schedule a follow-up appointment in: 6 months with Dr Antoine Poche 2)   Your physician recommends that you continue on your current medications as directed. Please refer to the Current Medication list given to you today.

## 2010-02-19 NOTE — Consult Note (Signed)
Summary: [facility]  [facility]   Imported By: Lester [place] 07/03/2009 09:18:55  _____________________________________________________________________  External Attachment:    Type:   Image     Comment:   External Document

## 2010-02-19 NOTE — Progress Notes (Signed)
----   Converted from flag ---- ---- 07/25/2009 2:13 PM, Edman Circle wrote: appt 7/18 @ 11:00  ---- 07/25/2009 1:57 PM, Dagoberto Reef wrote: Thanks  ---- 07/25/2009 11:40 AM, Corwin Levins MD wrote: The following orders have been entered for this patient and placed on Admin Hold:  Type:     Referral       Code:   Radiology Description:   Radiology Referral Order Date:   07/25/2009   Authorized By:   Corwin Levins MD Order #:   [id number] Clinical Notes:   Name of Test or Procedure: aortic u/s - male over 59 with hx of tobacco  Of What:  Special Instructions ------------------------------

## 2010-03-16 ENCOUNTER — Encounter: Payer: Self-pay | Admitting: Internal Medicine

## 2010-03-19 ENCOUNTER — Other Ambulatory Visit: Payer: Self-pay | Admitting: Internal Medicine

## 2010-03-19 ENCOUNTER — Encounter: Payer: Self-pay | Admitting: Internal Medicine

## 2010-03-19 ENCOUNTER — Other Ambulatory Visit: Payer: Self-pay

## 2010-03-19 ENCOUNTER — Ambulatory Visit (INDEPENDENT_AMBULATORY_CARE_PROVIDER_SITE_OTHER)
Admission: RE | Admit: 2010-03-19 | Discharge: 2010-03-19 | Disposition: A | Discharge status: Home / Self Care | Payer: Medicare Other | Source: Ambulatory Visit | Attending: Internal Medicine | Admitting: Internal Medicine

## 2010-03-19 ENCOUNTER — Ambulatory Visit
Admission: RE | Admit: 2010-03-19 | Discharge: 2010-03-19 | Disposition: A | Discharge status: Home / Self Care | Payer: Self-pay | Source: Ambulatory Visit | Attending: Internal Medicine | Admitting: Internal Medicine

## 2010-03-19 DIAGNOSIS — M899 Disorder of bone, unspecified: Secondary | ICD-10-CM

## 2010-03-19 DIAGNOSIS — M949 Disorder of cartilage, unspecified: Secondary | ICD-10-CM

## 2010-03-26 NOTE — Miscellaneous (Signed)
Summary: Orders Update   Clinical Lists Changes  Orders: Added new Test order of T-Lumbar Vertebral Assessment (77082) - Signed 

## 2010-03-31 ENCOUNTER — Telehealth: Payer: Self-pay | Admitting: Internal Medicine

## 2010-04-07 NOTE — Progress Notes (Signed)
Summary: Rx pharmacy change  Phone Note Refill Request Message from:  Patient on March 31, 2010 10:16 AM  Refills Requested: Medication #1:  ACTONEL 150 MG TABS 1 by mouth once a month.   Dosage confirmed as above?Dosage Confirmed   Supply Requested: 1 year Pt called requesting Rx for Actonel be sent to [facility] instead of [facility]   Method Requested: Electronic Initial call taken by: Margaret Pyle, CMA,  March 31, 2010 10:17 AM    Prescriptions: ACTONEL 150 MG TABS (RISEDRONATE SODIUM) 1 by mouth once a month  #12 x 3   Entered by:   Margaret Pyle, CMA   Authorized by:   Corwin Levins MD   Signed by:   Margaret Pyle, CMA on 03/31/2010   Method used:   Faxed to ...       [facility] (mail-order)             , [place]         Ph:        Fax: [phone]   RxID:   [id number]

## 2010-06-05 NOTE — Op Note (Signed)
[facility]  Patient:    [name], [name]                       MRN: [id number] Proc. Date: 09/28/99 Adm. Date:  [id number] Attending:  Laqueta Jean CC:         Corwin Levins, M.D. [facility]  Margaretmary Dys, M.D.   Operative Report  PREOPERATIVE DIAGNOSES:  Adenocarcinoma of the prostate.  POSTOPERATIVE DIAGNOSES:  Adenocarcinoma of the prostate.  OPERATION PERFORMED:  Radical retropubic prostatectomy.  SURGEON:  Dr. Patsi Sears.  ANESTHESIA:  General endotracheal.  FIRST ASSISTANT:  Dr. Barron Alvine.  PREPARATION:  After appropriate preanesthesia, the patient was brought to the operating room placed on the operating table in dorsal supine position where general endotracheal anesthesia was introduced. He was then replaced in the dorsal lithotomy position where the pubis was prepped with Betadine solution and draped in the usual fashion.  HISTORY OF PRESENT ILLNESS:  This 77 year old male has a history of a PSA of 11.2, while on Proscar over the last 10 years. His true PSA was felt to be 22.4, and prostate biopsy showed a Gleason of 3 + 4, adenocarcinoma of the prostate in the left central biopsies, left lateral biopsies, and apical biopsies. CT is negative. Bone scan was negative.  The patient elected radical retropubic prostatectomy as treatment of choice, and was covered Lupron injection in July awaiting second opinion and evaluation for treatment options.  DESCRIPTION OF PROCEDURE:  A 20 cm midline incision was made from the umbilicus to the pubic tubercle and subcutaneous tissue was dissected with the electrosurgical unit.  The right and left pelvic gutter is dissected, following this, the obturator lymph node package is dissected bilaterally from the external iliac vein, lateralward to the pelvic side wall, and inferiorly to the obturator lymph node, and distalwards to the femoral canal. The tissue was sent separately for permanent  evaluation.  The retropubic fascia was then dissected bilaterally, and the puboprostatic ligaments are cut bilaterally. Bleeding was noted from the dorsal vein complex, and this was controlled with 3 separate #1 Vicryl sutures horizontally placed with mattress sutures. Following this, the Hoenfelner was used to dissect across the top of the urethra and with Foley catheter in place, and #0 Vicryl ties were placed. The urethra was then incised, and following this, the posterior portion of the urethra was incised. The Foley catheter was then clamped and cut and brought into the abdomen, and used as a traction device. The posterior portion of the prostate was dissected, the vas was identified bilaterally and clipped with wet clips. The dissection was then accomplished of both right and left seminal vesicles and wet clips were used to place across the arteries of the seminal vesicles. The bladder neck was then dissected with the electrosurgical unit, as well as sharp and blunt dissection, with great care taken to avoid any injury to the bladder neck itself. The prostate was then dissected and the vascular pedicle was ligated with #0 silk suture. The prostate was removed, and wound irrigation was accomplished. Bleeding was well controlled.  The rectum appeared normal, and therefore, the bladder neck was opened with 4-0 Vicryl in the bladder neck, and the 5 suture anastomosis was then accomplished bringing the bladder neck down to the urethra. The anastomosis was water tight. A Blake drain was placed to the left pelvic fossa, with the suture in place with 3-0 nylon suture.  The abdominal fascia was then closed with  running #1 PDS suture. Following this, placement of 2 separate Marcaine alpha 200 infusion pumps were then placed at 4 cc per hour. The edges of the wound were injected with 0.5 plain Marcaine, and following this, the skin was closed with a skin stapler. A sterile dressing was  applied and the patient was awakened after given IV Toradol and taken to the recovery room in good condition. The patient had given himself 1 unit of blood, and this was given back to the patient. He was taken to the recovery room in good condition. DD:  09/28/99 TD:  09/28/99 Job: [id number]

## 2010-06-05 NOTE — Consult Note (Signed)
NAME:  [name], HEINKEL                          ACCOUNT NO.:  [id number]   MEDICAL RECORD NO.:  [id number]                   PATIENT TYPE:  AMB   LOCATION:  ENDO                                 FACILITY:  [facility]   PHYSICIAN:  [name], M.D. [facility]               DATE OF BIRTH:  Nov 27, 1926   DATE OF CONSULTATION:  DATE OF DISCHARGE:                                   CONSULTATION   CONSULTING PHYSICIAN:  [name], M.D. [facility]   PROCEDURE:  Colonoscopy.   INDICATIONS:  This 77 year old gentleman with a history of adenomatous  polyps of the colon which were found on colonoscopy in May 2001.  One was a  hyperplastic polyp and adenomatous.  He is asymptomatic.  He has also a  positive family history of colon cancer.  His Plavix and aspirin were  discontinued last month, after having a coronary stent placed by Dr.  Antoine Poche six months previously.  He is undergoing upper colonoscopy for  followup of colon polyps.   ENDOSCOPE:  Fujinon single-channel videoendoscope.   SEDATION:  Versed 3 mg IV, Fentanyl 25 mcg IV.   FINDINGS:  The Fujinon single-channel videoendoscope was passed __________  rectum to the sigmoid colon.  The patient was monitored by pulse oximetry.  His oxygen saturation was as low as 93% and as high as 98%.  His prep was  excellent.  Anal canal was normal.  Rectal ampulla showed prominent  hemorrhoidal veins but no definite hemorrhoids.  The sigmoid colon was well  cleaned.  There is normal-appearing mucosa.  Mucosa throughout the colon  appeared somewhat hyperemic but there were no focal lesions.  There was no  diverticula.  Colonoscope passed easily through the descending colon,  splenic flexure to the transverse colon, hepatic flexure, ascending colon,  and to the cecum.  Cecal pouch __________  were identified and appeared  normal.  Colonoscope was then slowly retracted from the right to the left  colon.  There were no polyps noted throughout the colon.  The patient  tolerated the procedure well.   IMPRESSION:  1. Normal colonoscopy of the cecum.  2. No evidence for recurrent polyps.   PLAN:  1. Yearly hemoccult screening.  2. High fiber diet.  3. Repeat colonoscopy in five years.                                               [name], M.D. [facility]    DB/MEDQ  D:  03/27/2003  T:  03/27/2003  Job:  [id number]

## 2010-06-05 NOTE — Cardiovascular Report (Signed)
NAME:  [name], [name]                          ACCOUNT NO.:  [id number]   MEDICAL RECORD NO.:  [id number]                   PATIENT TYPE:  OIB   LOCATION:  2860                                 FACILITY:  [facility]   PHYSICIAN:  Carole Binning, M.D. [facility]         DATE OF BIRTH:  07-Mar-1926   DATE OF PROCEDURE:  08/21/2002  DATE OF DISCHARGE:                              CARDIAC CATHETERIZATION   PROCEDURE:  1. Left heart catheterization with coronary angiography and left     ventriculography.  2. PTCA with stent placement utilizing a drug eluting stent in the first     obtuse marginal branch.   INDICATIONS:  [name] is a 77 year old male who presented with an  episode of chest pain with exertion.  His stress Cardiolite scan showed  inferior ischemia.   CATHETERIZATION PROCEDURAL NOTE:  A 6-French sheath was placed in the right  femoral artery.  Coronary angiography was performed with 6-French JL4 and  JR4 catheters.  Left ventriculography was performed with an angled pigtail  catheter.  Contrast was Omnipaque.  There were no complications.   RESULTS:   HEMODYNAMICS:  Left ventricular pressure 162/18.  Aortic pressure 164/90.  On pullback across the aortic valve there was an aortic valve gradient which  measured 29 mm peak to peak and 25 mmHg mean gradient.   LEFT VENTRICULOGRAM:  Wall motion is normal.  Ejection fraction calculated  at 69%.  There is no mitral regurgitation.   CORONARY ARTERIOGRAPHY:  (Left dominant)   Left main has less than 20% stenosis.   Left anterior descending has a 30% stenosis at its origin and a diffuse 30%  stenosis in the mid to distal vessel.  The LAD gives rise to a first  diagonal normal sized branch, second diagonal, and a small third diagonal.   Left circumflex is a large, dominant vessel.  There is a 30% stenosis in the  proximal to mid circumflex at the origin of the first obtuse marginal  branch.  In the distal circumflex just before  the posterior descending  artery there is a 40% stenosis.  The circumflex gives rise to a large  branching OM 1 which has a 40% stenosis at its ostium followed by a 95%  stenosis just proximal to its bifurcation.  In the more lateral larger of  the two branches of the obtuse marginal there is an 80% stenosis.  There is  a small second and third obtuse marginal branches arising from the mid  circumflex and the distal circumflex gives rise to small first and second  posterolateral branches and a small to normal sized posterior descending  artery.   Right coronary artery is a small, nondominant vessel.   IMPRESSION:  1. Normal left ventricular systolic function.  2. Mild aortic stenosis.  3. One vessel coronary artery disease characterized by complex bifurcational     disease in the first obtuse marginal branch.  PLAN:  Percutaneous intervention of the obtuse marginal.  See below.   PTA PROCEDURAL NOTE:  Following completion of diagnostic catheterization, we  proceeded with percutaneous coronary intervention.  We utilized the  preexisting 6-French sheath in the right femoral artery.  Heparin and  Integrilin were administered per protocol.  We used a 6-French CLS 3.5  guiding catheter.  A BMW wire was manipulated beyond the stenosis and  advanced into the more lateral branch of the first obtuse marginal which was  the larger of the two branches.  We then advanced a high torque floppy wire  beyond the stenosis in the mid marginal and steering this into the smaller  more medial branch of the obtuse marginal.  PTCA of the main branch of the  obtuse marginal was then performed with a 2.25 x 12 mm Quantum balloon  inflated to 10 atmospheres.  We then performed PTCA of the medial side  branch with the same 2.25 x 12 mm Quantum balloon inflating it to 5  atmospheres.  There was evidence of a linear dissection in the main branch  of the obtuse marginal extending into the larger more lateral  branch.  We  therefore carefully positioned a 2.5 x 16 mm Taxus drug eluting stent such  that the stent covered both the 95% stenosis and the main branch obtuse  marginal proximal to the bifurcation as well as the 80% stenosis in the  larger lateral branch of the obtuse marginal.  The stent did extend across  the origin of the side branch.  The floppy wire was removed from the side  branch and a stent was deployed at a deployment pressure of 11 atmospheres.  Following this we readvanced our floppy wire through the struts of the stent  into the medial side branch and performed PTCA to the side branch through  the struts with a 2.25 x 12 mm Quantum balloon inflating it to 6 atmospheres  and we then performed PTCA of the stent itself in the obtuse marginal  extending into a more lateral branch with a 2.5 x 12 mm Quantum balloon  inflating it to 14 atmospheres in the distal aspect of the stent, 20  atmospheres in the mid stent, and 18 atmospheres in the proximal aspect of  the stent.  Final angiographic images are obtained.  Three obtuse marginals  with 0% residual stenosis and TIMI 3 flow.  The patency of the side branch  was maintained.  At the conclusion of procedure a Perclose vascular closure  device was placed in the right femoral artery with good hemostasis.   COMPLICATIONS:  None.   RESULTS:  Successful PTCA and stent placement in the first obtuse marginal  branch as described.  A complex bifurcational 95% followed by an 80%  stenosis were both reduced to 0% residual with TIMI 3 flow.  Patency of the  medial side branch to the obtuse marginal was preserved.   PLAN:  Integrilin will be continued overnight.  It is recommended Plavix be  administered for six months along with lifelong aspirin therapy.                                               Carole Binning, M.D. [facility]    MWP/MEDQ  D:  08/21/2002  T:  08/22/2002  Job:  [id number]   cc:   Fayrene Fearing  Ellin Mayhew, M.D. [facility]  Rollene Rotunda, M.D.   [facility]

## 2010-06-05 NOTE — H&P (Signed)
NAME:  [name], [name]                          ACCOUNT NO.:  [id number]   MEDICAL RECORD NO.:  [id number]                   PATIENT TYPE:  INP   LOCATION:  3735                                 FACILITY:  [facility]   PHYSICIAN:  [name], M.D.                DATE OF BIRTH:  03-13-1926   DATE OF ADMISSION:  11/16/2002  DATE OF DISCHARGE:                                HISTORY & PHYSICAL   PRIMARY CARDIOLOGIST:  [name], M.D.   PRIMARY CARE PHYSICIAN:  [name], M.D.   CHIEF COMPLAINT:  Elevated blood pressure, palpitations, throat tightness,  and shortness of breath.   HISTORY OF PRESENT ILLNESS:  [name] is a very pleasant 77 year old  married white male patient of Dr. Rollene [name] with a history of coronary  artery disease status post stent placement to the first obtuse marginal on  August 21, 2002.  He was seen in our office today as an add-on.  He was  awakened this morning with throat tightness, shortness of breath, and  pounding heart beat.  He took his blood pressure, and it was elevated at  180/100.  This lasted for about an hour.  He took his Diovan.  His blood  pressure came down, and his symptoms soon resolved.  He comes into the  office as noted above as an add-on.  His symptoms in the office are totally  resolved.  He denies any chest discomfort.  He notes that he had some throat  tightness prior to his percutaneous coronary intervention.  However, he has  continued to have these symptoms.  He usually notices discomfort in his neck  when he moves his head.  He says it often feels like it is cutting off his  airway.  He still gets short of breath with minimal exertion.  This, too,  was present prior to his intervention and has not changed.   PAST MEDICAL HISTORY:  1. As noted above, he has coronary artery disease.  2. Hypertension.  3. Peripheral vascular disease.  4. Status post right carotid endarterectomy.  He also has an occluded     carotid  artery on the left.  5. History of treated hyperlipidemia.  6. Prostate cancer and status post prostatectomy.  7. He had a baker's cyst removed in 1998.   CURRENT MEDICATIONS:  1. Aspirin 325 mg daily.  2. Vitamin E.  3. Folic acid.  4. Prilosec 20 mg daily.  5. Diovan 80 mg daily.  6. Lipitor 10 mg q.h.s.  7. L-Lysine 500 mg a day.  8. Toprol XL 25 mg a day.  9. Plavix 75 mg daily.  10.      Nitroglycerin p.r.n. chest pain.   ALLERGIES:  No known drug allergies.  He does note having hallucinations  when he took some KIDNEY MEDICINES in the past, but he does not remember the  names.  SOCIAL HISTORY:  He lives in [place] with his wife.  He quit smoking 40  years ago.   FAMILY HISTORY:  Noncontributory.   REVIEW OF SYSTEMS:  Please see the HPI.  He denies melena, hematochezia,  hematuria, dysuria, fevers, chills, cough, sore throat, numbness, or  tingling.   PHYSICAL EXAMINATION:  GENERAL:  Well-nourished, well-developed male in no  acute distress.  VITAL SIGNS:  Blood pressure 127/87, pulse 68, respirations 12. Weight 192  pounds.  HEENT: Normocephalic and atraumatic.  NECK:  Supple without JVD.  LYMPHADENOPATHY:  None.  CARDIAC:  Normal S1 and S2.  Regular rate and rhythm.  A 2/6 systolic  ejection murmur.  LUNGS:  Clear to auscultation bilaterally.  SKIN:  Without rashes.  ABDOMEN:  Soft, nontender.  Normoactive bowel sounds.  EXTREMITIES:  Without clubbing, cyanosis, or edema.  MUSCULOSKELETAL:  Without joint deformity.  NEUROLOGIC:  Nonfocal.   LABORATORY DATA:  Electrocardiogram reveals normal sinus rhythm with heart  rate 68, nonspecific ST-T wave changes.   IMPRESSION:  1. Palpitations, throat tightness, shortness of breath.  Question anginal     equivalent.  2. Coronary artery disease.     a. Status post Taxus stent to the first obtuse marginal August 2004.     b. Mild nonobstructive residual coronary artery disease.     c. Normal left ventricular  ejection fraction.  3. Hypertension.  4. Peripheral vascular disease.     a. Status post right carotid endarterectomy     b. History of totally occluded left carotid artery.  5. Treated dyslipidemia.  6. Prostate cancer with history of prostatectomy.    PLAN:  The patient was also seen by Dr. Jens [name] today.  We plan to admit  him directly from the office to [facility].  We will continue all  of his current medications and check serial enzymes.  If his enzymes are  negative, then will set him up for a stress Cardiolite tomorrow morning.      [name], P.A.                        [name], M.D.    SW/MEDQ  D:  11/16/2002  T:  11/16/2002  Job:  [id number]   cc:   [name], M.D.   [name], M.D. [facility]

## 2010-06-05 NOTE — Discharge Summary (Signed)
NAME:  [name], [name]                          ACCOUNT NO.:  [id number]   MEDICAL RECORD NO.:  [id number]                  PATIENT TYPE:  INP   LOCATION:                                       FACILITY:  [facility]   PHYSICIAN:  Olga Millers, M.D.                DATE OF BIRTH:  12-17-1926   DATE OF ADMISSION:  11/16/2002  DATE OF DISCHARGE:  11/17/2002                                 DISCHARGE SUMMARY   DISCHARGE DIAGNOSES:  1. Palpitations, throat tightness, and shortness of breath--etiology     unclear.  2. Coronary artery disease.     a. Status post Taxus stent to the first obtuse marginal, August 2004.     b. Mild nonobstructive residual coronary artery disease.     c. Normal left ventricular function.  3. Hypertension.  4. Peripheral vascular disease status post right carotid endarterectomy.  5. Treated dyslipidemia.  6. History of prostate cancer status post prostatectomy.   HOSPITAL COURSE:  Please see the dictated admission history and physical by  Dr. Olga Millers for complete details.  Briefly, this 77 year old male  patient of Dr. Antoine Poche presented to our office with complaints of being  awoken by throat tightness, shortness of breath, and a pounding heartbeat.  It was decided to go ahead and admit the patient for further evaluation.  Cardiac enzymes were negative x2.  He went for adenosine Cardiolite on  November 17, 2002.  This was negative for ischemia or infarct and his  ejection fraction was 73%.  Therefore it was decided to go ahead and  discharge the patient home.  It was felt he was in stable condition.   LABORATORY DATA:  CK-MB and troponin I were negative x2.   DISCHARGE MEDICATIONS:  1. Aspirin 325 mg daily.  2. Vitamin E.  3. Folic acid.  4. Prilosec 20 mg daily.  5. Diovan 80 mg daily.  6. Lipitor 10 mg q.h.s.  7. L-lysine 500 mg daily.  8. Toprol-XL 25 mg daily.  9. Plavix 75 mg daily.  10.      Nitroglycerin as needed for chest pain.   PAIN  MANAGEMENT:  Nitroglycerin as needed for chest pain.  He is to call our  office or 911 with recurrent chest pain.   ACTIVITY:  As tolerated.   DIET:  Low fat, low sodium.   FOLLOWUP:  The patient should follow up with Dr. [name] in one to two weeks  and he should call for an appointment.  The patient will follow up with Dr.  Antoine Poche in three to four weeks and the office will contact him with an  appointment.      Tereso Newcomer, P.A.                        Olga Millers, M.D.    SW/MEDQ  D:  11/17/2002  T:  11/17/2002  Job:  [id number]   cc:   Corwin Levins, M.D. [facility]   Rollene Rotunda, M.D.

## 2010-06-05 NOTE — Discharge Summary (Signed)
[facility]  Patient:    [name], [name]                       MRN: [id number] Adm. Date:  [id number] Disc. Date: [id number] Attending:  Laqueta Jean                           Discharge Summary  FINAL DIAGNOSIS:   Adenocarcinoma of the prostate, stage T3.  ADDITIONAL DIAGNOSES: 1. Hypertension. 2. History ofalcohol abuse. 3. History of gastroesophageal reflux disease. 4. History of amaurosis fugax of the right eye with internal    carotid stenosiswith cerebrovascular accident in 1992.  OPERATIONS THIS ADMISSION:  September 28, 1999, radical retropubic prostatectomy.  HISTORY:  [name] is a 77 year old white married male with a history of PSA of 11.2 while on Proscar since the early 1990s.  His true PSA was 22.4. The patient had an 18 cc prostate by ultrasound, but biopsy showed Gleason 3+4 adenocarcinoma in the left central biopsies, left lateral biopsies, and apical biopsies.  His CT scan was negative and blood scan was negative.  The patient was evaluated for conservative external beam radiation therapy, but decided to have radical retropubic prostatectomy for extirpation ofhis tumor.  PAST MEDICAL HISTORY: 1. Hypertension. 2.History of tobacco use. 3. History of alcohol abuse. 4. GERD. 5. Amaurosis fugax of the right eye with internal carotid artery stenosis and stroke in 1992.  SOCIAL HISTORY:  Tobacco: One-half pack a day for 27 years.  No cigarettes since 1965.  Alcohol: Three to four beers per day.  FAMILY HISTORY:  Noncontributory.  MEDICATIONS: 1. Procardia XL 30 mg a day. 2. Proscar 5 mg a day.  PHYSICAL EXAMINATION:  GENERAL:  A well-developed, thin, white male in no acute distress.  VITAL SIGNS:  Blood pressure 170/90, pulse 60, respiratory rate 18, temperature 97.2.  Remaining physical examination is as noted on physical exam of September 28, 1999.  HOSPITAL LABORATORY DATA:  Admission hemoglobin 15.4,  hematocrit 42.9. Discharge hemoglobin 8.6, hematocrit 25.0 (stable).  The patient received a one-unit autologous transfusion in the hospital.  HOSPITAL COURSE:  On the day of admission, the patient underwent radical retropubic prostatectomy.  He was given 1 units of autologous transfusion, but because he remained stable, he received no more transfusions.  The patient had check of his blood pressure postoperatively with orthostatic pressures showing no change, and he was allowed to mobilize, bathe, and diet was progressed. The Jackson-Prattwas removed on the second postoperative day, and the patient was allowedto be discharged on October 01, 1999, in stable condition.  I have reviewed his pathology extensively with the patient and his wife and have advised him to reconsider having additional radiation therapy.  We will consider this as his postoperative course.  He will return in two weeks to have his hemoglobin checked and also to have Foley catheter removed.  CONDITION UPON DISCHARGE:  Stable. DD:  10/01/18 TD:  10/02/99 Job: [id number]

## 2010-06-05 NOTE — Discharge Summary (Signed)
NAME:  [name], [name]                          ACCOUNT NO.:  [id number]   MEDICAL RECORD NO.:  [id number]                   PATIENT TYPE:  OIB   LOCATION:  6532                                 FACILITY:  [facility]   PHYSICIAN:  Rollene Rotunda, M.D.                DATE OF BIRTH:  04-09-1926   DATE OF ADMISSION:  08/21/2002  DATE OF DISCHARGE:  08/22/2002                           DISCHARGE SUMMARY - REFERRING   PROCEDURE:  Coronary artery stenting, August 21, 2002.   REASON FOR ADMISSION:  Please refer to dictated admission note.   LABORATORY DATA:  Cardiac enzymes:  CPK, MB normal (x 2).  Troponin I 0.06  (post PCI).  CBC normal at discharge.  Sodium 131, potassium 4.5, glucose  117, BUN 8, creatinine 1.0 at discharge.   HOSPITAL COURSE:  The patient presented for elective coronary angiography,  performed by Dr. Daisey Must (see report for full details), revealing a  severe single vessel coronary artery disease with normal left ventricular  function and mild aortic stenosis.  Specifically, there was 95% first obtuse  marginal lesion which was successfully treated with placement of a TAXUS  stent with reduction of the lesion to 0% residual stenosis.  Residual  coronary anatomy notable for nonobstructive LAD, circumflex and right  coronary artery.  As noted LV function was normal (69%) with no evident wall  motion abnormality.  There was mild aortic stenosis (mean gradient 25 mmHg;  peak 29 mmHg).   The patient was treated with heparin and Integrelin and started on Plavix  and kept for overnight observation.   The following morning, the patient did develop a 20 beat run of accelerated  idioventricular rhythm, which was asymptomatic.  No further recurrences were  noted.  The patient was started on low dose beta-blocker which he was  tolerating at the time of discharge.  The patient also had a complaint of  left sided chest discomfort which was felt to be typical and which was  relieved with burping.  Repeat enzymes showed normal MB with marginal  elevation of the troponin I marker.  Patient continued to ambulate without  conflicting chest pain at the time of discharge.   Further recommendations are to place the patient on a 24-hour Holter  monitor, following discharge, for documentation of any recurrent  dysrhythmias.   MEDICATION ADJUSTMENTS:  This admission:  Addition of Toprol 25 every day,  Plavix 75 mg every day (x 6 months).   DISCHARGE MEDICATIONS:  1. Plavix 75 mg every day (x 6 months).  2. Toprol XL 25 mg every day.  3. Coated aspirin 325 mg every day.  4. Diovan 80 mg every day.  5. Lipitor 10 mg every day.  6. Prilosec 20 mg every day.  7. Nitroglycerin 0.4 mg p.r.n.   INSTRUCTIONS:  1. No strenuous activity, driving x 2 days.  2. Maintain low-fat, low-cholesterol diet.  3. Call the  office if there is any swelling, bleeding of the groin.   The patient will be placed on a 24-hour Holter monitor with arrangements  made through our office.  The patient is scheduled to followup with Dr.  Antoine Poche on Friday, September 07, 2002 at 9:45 a.m.   DISCHARGE DIAGNOSES:  1. Severe single vessel coronary artery disease.     a. Status post stent (TAXUS) 95% - 80% first obtuse marginal.     b. Normal left ventricular function.  2. Mild aortic stenosis.  3. Accelerated idioventricular rhythm.  4. Dyslipidemia.  5. Cerebrovascular disease.     a. Status post right carotid endarterectomy, known total occlusion left        internal carotid artery.  6. History of hypertension.      Gene Serpe, P.A. [facility]                      Rollene Rotunda, M.D.    GS/MEDQ  D:  08/22/2002  T:  08/22/2002  Job:  [id number]   cc:   Corwin Levins, M.D. [facility]

## 2010-06-05 NOTE — H&P (Signed)
[facility]  Patient:    [name], [name]                       MRN: [id number] Adm. Date:  [id number] Attending:  Laqueta Jean CC:         Corwin Levins, M.D. [facility]   History and Physical  HISTORY OF PRESENT ILLNESS:  Mr. Clagg is a 77 year old, married, white male with a history of a PSA of 11.2 while on Proscar since the early 1990s.  His ______ PSA was 22.4.  The patient has a prostate that measures only 18 cc by ultrasound.  A prostate biopsy showed that the patient had Gleason 3 + 4 adenocarcinoma in the left central biopsies, left lateral biopsies, and apical biopsies.  He had negative CT scan and negative bone scan.  The patient had extensive evaluation and consultation with regard to alternative therapies, including radiation therapy and seed therapy, but decided, along with his family, to have radical retropubic prostatectomy.  He is now admitted for that.  PAST MEDICAL HISTORY:  Significant for:  1. Hypertension.  2. History of tobacco use.  3. History of alcohol abuse.  4. History of GERD.  5. History of amaurosis fugax of the right eye with internal carotid artery stenosis and status post CVA in 1992.  SOCIAL HISTORY:  Tobacco:  One half pack a day for 27 years.  The patient has had no cigarettes since 1965.  The patient does drink three to four beers per day.  FAMILY HISTORY:  Noncontributory.  MEDICATIONS: 1. Procardia XL 30 mg a day. 2. Proscar 5 mg a day.  PHYSICAL EXAMINATION:  The admission physical examination shows a well-developed, well-nourished, thin, white male in no acute distress.  VITAL SIGNS:  His blood pressure is 170/90, the pulse is 60, respiratory rate 18, and temperature 97.2 degrees.  HEENT:  PERRLA.  EOM full.  NECK:  Supple and nontender.  CHEST:  Clear.  ABDOMEN:  Soft.  Positive bowel sounds.  No organomegaly or masses.  GENITALIA:  Normal male external genitalia.  RECTAL:  Small, 2+,  smooth prostate with no masses and no blood.  EXTREMITIES:  Without cyanosis or edema.  NEUROLOGIC:  Physiologic.  ADMITTING IMPRESSION: 1. Adenocarcinoma of the prostate. 2. History of hypertension. 3. History of stroke.  PLAN:  Admit for radical retropubic prostatectomy. DD:  09/28/99 TD:  09/28/99 Job: [id number]

## 2010-07-23 ENCOUNTER — Encounter: Payer: Medicare Other | Admitting: *Deleted

## 2010-07-23 ENCOUNTER — Encounter: Payer: Self-pay | Admitting: Internal Medicine

## 2010-07-23 ENCOUNTER — Ambulatory Visit: Payer: Medicare Other | Admitting: Cardiology

## 2010-07-26 ENCOUNTER — Encounter: Payer: Self-pay | Admitting: Internal Medicine

## 2010-07-29 ENCOUNTER — Other Ambulatory Visit: Payer: Self-pay | Admitting: Internal Medicine

## 2010-07-29 DIAGNOSIS — I6529 Occlusion and stenosis of unspecified carotid artery: Secondary | ICD-10-CM

## 2010-07-30 ENCOUNTER — Encounter: Payer: Self-pay | Admitting: Internal Medicine

## 2010-07-30 ENCOUNTER — Encounter: Payer: Self-pay | Admitting: Cardiology

## 2010-07-30 ENCOUNTER — Ambulatory Visit (INDEPENDENT_AMBULATORY_CARE_PROVIDER_SITE_OTHER): Payer: Medicare Other | Admitting: Internal Medicine

## 2010-07-30 ENCOUNTER — Other Ambulatory Visit (INDEPENDENT_AMBULATORY_CARE_PROVIDER_SITE_OTHER): Payer: Medicare Other

## 2010-07-30 VITALS — BP 112/68 | HR 46 | Temp 97.3°F | Ht 71.0 in | Wt 188.8 lb

## 2010-07-30 DIAGNOSIS — Z0000 Encounter for general adult medical examination without abnormal findings: Secondary | ICD-10-CM

## 2010-07-30 DIAGNOSIS — E785 Hyperlipidemia, unspecified: Secondary | ICD-10-CM

## 2010-07-30 DIAGNOSIS — I10 Essential (primary) hypertension: Secondary | ICD-10-CM

## 2010-07-30 DIAGNOSIS — Z125 Encounter for screening for malignant neoplasm of prostate: Secondary | ICD-10-CM

## 2010-07-30 DIAGNOSIS — Z79899 Other long term (current) drug therapy: Secondary | ICD-10-CM

## 2010-07-30 LAB — CBC WITH DIFFERENTIAL/PLATELET
Basophils Relative: 0.8 % (ref 0.0–3.0)
Eosinophils Relative: 6.3 % — ABNORMAL HIGH (ref 0.0–5.0)
HCT: 40.1 % (ref 39.0–52.0)
Hemoglobin: 13.8 g/dL (ref 13.0–17.0)
Lymphs Abs: 0.6 10*3/uL — ABNORMAL LOW (ref 0.7–4.0)
MCV: 94.1 fl (ref 78.0–100.0)
Monocytes Absolute: 0.7 10*3/uL (ref 0.1–1.0)
Monocytes Relative: 13.8 % — ABNORMAL HIGH (ref 3.0–12.0)
Neutro Abs: 3.4 10*3/uL (ref 1.4–7.7)
RBC: 4.26 Mil/uL (ref 4.22–5.81)
WBC: 5.1 10*3/uL (ref 4.5–10.5)

## 2010-07-30 LAB — URINALYSIS, ROUTINE W REFLEX MICROSCOPIC
Ketones, ur: NEGATIVE
Specific Gravity, Urine: 1.01 (ref 1.000–1.030)
Urine Glucose: NEGATIVE
pH: 7 (ref 5.0–8.0)

## 2010-07-30 LAB — LIPID PANEL
Cholesterol: 132 mg/dL (ref 0–200)
HDL: 52.3 mg/dL (ref >39.00)
VLDL: 10.4 mg/dL (ref 0.0–40.0)

## 2010-07-30 LAB — BASIC METABOLIC PANEL
Calcium: 9.1 mg/dL (ref 8.4–10.5)
GFR: 82.59 mL/min (ref >60.00)
Potassium: 4.8 mEq/L (ref 3.5–5.1)
Sodium: 130 mEq/L — ABNORMAL LOW (ref 135–145)

## 2010-07-30 LAB — HEPATIC FUNCTION PANEL
ALT: 12 U/L (ref 0–53)
AST: 20 U/L (ref 0–37)
Total Protein: 7.1 g/dL (ref 6.0–8.3)

## 2010-07-30 MED ORDER — PNEUMOCOCCAL VAC POLYVALENT 25 MCG/0.5ML IJ INJ: 0.5000 mL | INJECTION | Freq: Once | INTRAMUSCULAR | Status: DC

## 2010-07-30 MED ORDER — DESLORATADINE 5 MG PO TABS: 5.0000 mg | ORAL_TABLET | Freq: Every day | ORAL | Status: DC

## 2010-07-30 NOTE — Patient Instructions (Signed)
Continue all other medications as before Please go to LAB in the Basement for the blood and/or urine tests to be done today Please call the phone number [phone] (the PhoneTree System) for results of testing in 2-3 days;  When calling, simply dial the number, and when prompted enter the MRN number above (the Medical Record Number) and the # key, then the message should start. Please return in 1 year for your yearly visit, or sooner if needed, with Lab testing done 3-5 days before  

## 2010-07-30 NOTE — Progress Notes (Signed)
Subjective:    Patient ID: [name], male    DOB: [birth year], 77 y.o.   MRN: [id number]  HPI Here for wellness and f/u;  Overall doing ok;  Pt denies CP, worsening SOB, DOE, wheezing, orthopnea, PND, worsening LE edema, palpitations, dizziness or syncope.  Pt denies neurological change such as new Headache, facial or extremity weakness.  Pt denies polydipsia, polyuria, or low sugar symptoms. Pt states overall good compliance with treatment and medications, good tolerability, and trying to follow lower cholesterol diet.  Pt denies worsening depressive symptoms, suicidal ideation or panic. No fever, wt loss, night sweats, loss of appetite, or other constitutional symptoms.  Pt states fair ability only with ADL's, has mod fall risk, home safety reviewed and adequate, no significant changes in hearing or vision, and occasionally active with exercise, as he can only ambulate approx 50 ft before stopping due to fatigue.  Not smoking Past Medical History  Diagnosis Date  . ADENOCARCINOMA, PROSTATE 08/06/2008  . ALLERGIC RHINITIS 06/02/2007  . Aortic valve disorders 07/01/2008  . B12 DEFICIENCY 06/05/2008  . BENIGN PROSTATIC HYPERTROPHY 06/02/2007  . CAROTID ARTERY DISEASE 04/29/2009  . CEREBROVASCULAR ACCIDENT, HX OF 06/02/2007  . CHEST PAIN 06/04/2008  . CORONARY ARTERY DISEASE 11/09/2006  . DIVERTICULOSIS, COLON 06/02/2007  . FATIGUE 06/02/2007  . GERD 06/02/2007  . HEMORRHOIDS 07/31/2008  . HIATAL HERNIA 07/31/2008  . HYPERLIPIDEMIA 11/09/2006  . HYPERTENSION 11/09/2006  . OSTEOPENIA 06/02/2007  . PEPTIC ULCER DISEASE 06/02/2007  . PERIPHERAL VASCULAR DISEASE 11/09/2006  . Personal history of alcoholism 07/31/2008  . PROSTATE CANCER, HX OF 11/09/2006  . PUD, HX OF 07/31/2008  . TRANSIENT ISCHEMIC ATTACK 04/16/2009  . Shingles    Past Surgical History  Procedure Date  . Popliteal synovial cyst excision   . Right carotid endarterectomy 1999    Left carotid total chronic occlusion  . Prostate  surgery   . Cardic stent 2004    reports that he has quit smoking. He does not have any smokeless tobacco history on file. He reports that he drinks alcohol. He reports that he does not use illicit drugs. family history includes Colon cancer (age of onset:62) in his mother; Coronary artery disease in his sister; Diabetes in his sister; Hypertension in his sister; Multiple sclerosis in his sister; and Stroke in his brother. Allergies  Allergen Reactions  . Tolterodine Tartrate    Current Outpatient Prescriptions on File Prior to Visit  Medication Sig Dispense Refill  . atorvastatin (LIPITOR) 20 MG tablet Take 20 mg by mouth daily.        . clopidogrel (PLAVIX) 75 MG tablet Take 75 mg by mouth daily.        . cyanocobalamin (,VITAMIN B-12,) 1000 MCG/ML injection Inject 1,000 mcg into the muscle every 30 (thirty) days.        . fexofenadine (ALLEGRA) 180 MG tablet Take 180 mg by mouth daily.        . metoprolol (TOPROL-XL) 50 MG 24 hr tablet Take 50 mg by mouth daily.        Marland Kitchen omeprazole (PRILOSEC) 40 MG capsule Take 40 mg by mouth daily.        . valsartan (DIOVAN) 80 MG tablet Take 80 mg by mouth daily.         No current facility-administered medications on file prior to visit.   Review of Systems Review of Systems  Constitutional: Negative for diaphoresis, activity change, appetite change and unexpected weight change.  HENT: Negative for hearing  loss, ear pain, facial swelling, mouth sores and neck stiffness.   Eyes: Negative for pain, redness and visual disturbance.  Respiratory: Negative for shortness of breath and wheezing.   Cardiovascular: Negative for chest pain and palpitations.  Gastrointestinal: Negative for diarrhea, blood in stool, abdominal distention and rectal pain.  Genitourinary: Negative for hematuria, flank pain and decreased urine volume.  Musculoskeletal: Negative for myalgias and joint swelling.  Skin: Negative for color change and wound.  Neurological: Negative  for syncope and numbness.  Hematological: Negative for adenopathy.  Psychiatric/Behavioral: Negative for hallucinations, self-injury, decreased concentration and agitation.      Objective:   Physical Exam BP 112/68  Pulse 46  Temp(Src) 97.3 F (36.3 C) (Oral)  Ht 5\' 11"  (1.803 m)  Wt 188 lb 12.8 oz (85.639 kg)  BMI 26.33 kg/m2  SpO2 99% Physical Exam  VS noted, elderly, frail Constitutional: Pt is oriented to person, place, and time. Appears well-developed and well-nourished.  Head: Normocephalic and atraumatic.  Right Ear: External ear normal.  Left Ear: External ear normal.  Nose: Nose normal.  Mouth/Throat: Oropharynx is clear and moist.  Eyes: Conjunctivae and EOM are normal. Pupils are equal, round, and reactive to light.  Neck: Normal range of motion. Neck supple. No JVD present. No tracheal deviation present.  Cardiovascular: Normal rate, regular rhythm, normal heart sounds and intact distal pulses.   Pulmonary/Chest: Effort normal and breath sounds normal.  Abdominal: Soft. Bowel sounds are normal. There is no tenderness.  Musculoskeletal: Normal range of motion. Exhibits no edema.  Lymphadenopathy:  Has no cervical adenopathy.  Neurological: Pt is alert and oriented to person, place, and time. Pt has normal reflexes. No cranial nerve deficit.  Skin: Skin is warm and dry. No rash noted.  Psychiatric:  Has  deoressed mood and affect. Behavior is normal.         Assessment & Plan:

## 2010-07-31 ENCOUNTER — Encounter (INDEPENDENT_AMBULATORY_CARE_PROVIDER_SITE_OTHER): Payer: Medicare Other | Admitting: *Deleted

## 2010-07-31 ENCOUNTER — Other Ambulatory Visit: Payer: Self-pay | Admitting: Internal Medicine

## 2010-07-31 DIAGNOSIS — I6529 Occlusion and stenosis of unspecified carotid artery: Secondary | ICD-10-CM

## 2010-08-03 ENCOUNTER — Encounter: Payer: Self-pay | Admitting: Internal Medicine

## 2010-08-05 ENCOUNTER — Ambulatory Visit (INDEPENDENT_AMBULATORY_CARE_PROVIDER_SITE_OTHER): Payer: Medicare Other | Admitting: Cardiology

## 2010-08-05 ENCOUNTER — Encounter: Payer: Self-pay | Admitting: *Deleted

## 2010-08-05 ENCOUNTER — Encounter: Payer: Self-pay | Admitting: Cardiology

## 2010-08-05 DIAGNOSIS — E785 Hyperlipidemia, unspecified: Secondary | ICD-10-CM

## 2010-08-05 DIAGNOSIS — I2510 Atherosclerotic heart disease of native coronary artery without angina pectoris: Secondary | ICD-10-CM

## 2010-08-05 DIAGNOSIS — I10 Essential (primary) hypertension: Secondary | ICD-10-CM

## 2010-08-05 DIAGNOSIS — I359 Nonrheumatic aortic valve disorder, unspecified: Secondary | ICD-10-CM

## 2010-08-05 DIAGNOSIS — Z01810 Encounter for preprocedural cardiovascular examination: Secondary | ICD-10-CM

## 2010-08-05 DIAGNOSIS — R079 Chest pain, unspecified: Secondary | ICD-10-CM

## 2010-08-05 LAB — BASIC METABOLIC PANEL
BUN: 11 mg/dL (ref 6–23)
CO2: 26 mEq/L (ref 19–32)
Calcium: 9.7 mg/dL (ref 8.4–10.5)
GFR: 70.25 mL/min (ref >60.00)
Glucose, Bld: 95 mg/dL (ref 70–99)
Potassium: 4.8 mEq/L (ref 3.5–5.1)
Sodium: 136 mEq/L (ref 135–145)

## 2010-08-05 LAB — PROTIME-INR
INR: 1 ratio (ref 0.8–1.0)
Prothrombin Time: 11.2 s (ref 10.2–12.4)

## 2010-08-05 NOTE — Assessment & Plan Note (Signed)
His LDL and HDL done a couple of days ago were excellent. No change in therapy is indicated. I note that his sodium was slightly low and I will repeat a basic metabolic profile.

## 2010-08-05 NOTE — Progress Notes (Signed)
HPI The patient presents for followup of aortic stenosis. He has critical AS by repeated echocardiograms. Remarkably he remains pretty asymptomatic. He denies any shortness of breath, PND or orthopnea. He said of chest pressure, neck or arm discomfort. He denies any palpitations, presyncope or syncope. I'm not sure, however he is completely asymptomatic or whether he has reduced his lifestyle. I have discussed with him the indications for aortic valve replacement. He finally agrees to have this done.  Allergies  Allergen Reactions  . Tolterodine Tartrate     Current Outpatient Prescriptions  Medication Sig Dispense Refill  . alendronate (FOSAMAX) 70 MG tablet Take 70 mg by mouth every 7 (seven) days. Take with a full glass of water on an empty stomach.       Marland Kitchen atorvastatin (LIPITOR) 20 MG tablet Take 20 mg by mouth daily.        . clopidogrel (PLAVIX) 75 MG tablet Take 75 mg by mouth daily.        . cyanocobalamin (,VITAMIN B-12,) 1000 MCG/ML injection Inject 1,000 mcg into the muscle every 30 (thirty) days.        . fexofenadine (ALLEGRA) 180 MG tablet Take 180 mg by mouth daily.        . metoprolol (TOPROL-XL) 50 MG 24 hr tablet Take 50 mg by mouth daily.        Marland Kitchen omeprazole (PRILOSEC) 40 MG capsule Take 40 mg by mouth daily.        . valsartan (DIOVAN) 80 MG tablet Take 80 mg by mouth daily.         Current Facility-Administered Medications  Medication Dose Route Frequency Provider Last Rate Last Dose  . pneumococcal 23 valent vaccine (PNU-IMMUNE) injection 0.5 mL  0.5 mL Intramuscular Once Oliver Barre, MD        Past Medical History  Diagnosis Date  . ADENOCARCINOMA, PROSTATE 08/06/2008  . ALLERGIC RHINITIS 06/02/2007  . Aortic valve disorders 07/01/2008  . B12 DEFICIENCY 06/05/2008  . BENIGN PROSTATIC HYPERTROPHY 06/02/2007  . CAROTID ARTERY DISEASE 04/29/2009  . CEREBROVASCULAR ACCIDENT, HX OF 06/02/2007  . CHEST PAIN 06/04/2008  . CORONARY ARTERY DISEASE 11/09/2006  . DIVERTICULOSIS,  COLON 06/02/2007  . FATIGUE 06/02/2007  . GERD 06/02/2007  . HEMORRHOIDS 07/31/2008  . HIATAL HERNIA 07/31/2008  . HYPERLIPIDEMIA 11/09/2006  . HYPERTENSION 11/09/2006  . OSTEOPENIA 06/02/2007  . PEPTIC ULCER DISEASE 06/02/2007  . PERIPHERAL VASCULAR DISEASE 11/09/2006  . Personal history of alcoholism 07/31/2008  . PROSTATE CANCER, HX OF 11/09/2006  . PUD, HX OF 07/31/2008  . TRANSIENT ISCHEMIC ATTACK 04/16/2009  . Shingles     Past Surgical History  Procedure Date  . Popliteal synovial cyst excision   . Right carotid endarterectomy 1999    Left carotid total chronic occlusion  . Prostate surgery   . Cardic stent 2004    ROS:  As stated in the HPI and negative for all other systems.  PHYSICAL EXAM BP 118/72  Pulse 64  Resp 16  Ht 5\' 11"  (1.803 m)  Wt 184 lb (83.462 kg)  BMI 25.66 kg/m2 GENERAL:  Well appearing HEENT:  Pupils equal round and reactive, fundi not visualized, oral mucosa unremarkable NECK:  No jugular venous distention, waveform within normal limits, carotid upstroke brisk and symmetric, no bruits (transmitted murmur), no thyromegaly, right CEA scar LYMPHATICS:  No cervical, inguinal adenopathy LUNGS:  Clear to auscultation bilaterally BACK:  No CVA tenderness CHEST:  Unremarkable HEART:  PMI not displaced or sustained,S1 and S2 within  normal limits, no S3, no S4, no clicks, no rubs, late peaking apical systolic murmur, 4/6. ABD:  Flat, positive bowel sounds normal in frequency in pitch, no bruits, no rebound, no guarding, no midline pulsatile mass, no hepatomegaly, no splenomegaly EXT:  2 plus pulses throughout, no edema, no cyanosis no clubbing SKIN:  No rashes no nodules NEURO:  Cranial nerves II through XII grossly intact, motor grossly intact throughout PSYCH:  Cognitively intact, oriented to person place and time  EKG:  Sinus bradycardia, rate 53, leftward axis, minimal voltage criteria for LVH  ASSESSMENT AND PLAN

## 2010-08-05 NOTE — Patient Instructions (Signed)
Please continue your current medications as listed Your physician has requested that you have a cardiac catheterization. Cardiac catheterization is used to diagnose and/or treat various heart conditions. Doctors may recommend this procedure for a number of different reasons. The most common reason is to evaluate chest pain. Chest pain can be a symptom of coronary artery disease (CAD), and cardiac catheterization can show whether plaque is narrowing or blocking your heart's arteries. This procedure is also used to evaluate the valves, as well as measure the blood flow and oxygen levels in different parts of your heart. For further information please visit [email or web address] Please follow instruction sheet, as given.

## 2010-08-05 NOTE — Assessment & Plan Note (Signed)
This will be assessed with his catheterization.

## 2010-08-05 NOTE — Assessment & Plan Note (Signed)
He has critical aortic stenosis by echo measurements. This has progressed rapidly. He needs aortic valve replacement and he consents to this. He will need right and left heart catheterization first. He understands this procedure and all risks and benefits and agrees to proceed.

## 2010-08-05 NOTE — Assessment & Plan Note (Signed)
The blood pressure is at target. No change in medications is indicated. We will continue with therapeutic lifestyle changes (TLC).  

## 2010-08-07 ENCOUNTER — Encounter: Payer: Self-pay | Admitting: *Deleted

## 2010-08-07 ENCOUNTER — Inpatient Hospital Stay (HOSPITAL_BASED_OUTPATIENT_CLINIC_OR_DEPARTMENT_OTHER)
Admission: RE | Admit: 2010-08-07 | Discharge: 2010-08-07 | Disposition: A | Discharge status: Home / Self Care | Payer: Medicare Other | Source: Ambulatory Visit | Attending: Cardiology | Admitting: Cardiology

## 2010-08-07 DIAGNOSIS — I359 Nonrheumatic aortic valve disorder, unspecified: Secondary | ICD-10-CM

## 2010-08-07 DIAGNOSIS — I2510 Atherosclerotic heart disease of native coronary artery without angina pectoris: Secondary | ICD-10-CM | POA: Insufficient documentation

## 2010-08-07 LAB — POCT I-STAT 3, ART BLOOD GAS (G3+)
Bicarbonate: 22 mEq/L (ref 20.0–24.0)
O2 Saturation: 69 %
pCO2 arterial: 39.6 mmHg (ref 35.0–45.0)
pH, Arterial: 7.37 (ref 7.350–7.450)
pH, Arterial: 7.426 (ref 7.350–7.450)
pO2, Arterial: 78 mmHg — ABNORMAL LOW (ref 80.0–100.0)

## 2010-08-09 ENCOUNTER — Encounter: Payer: Self-pay | Admitting: Internal Medicine

## 2010-08-09 NOTE — Assessment & Plan Note (Signed)

## 2010-08-10 ENCOUNTER — Institutional Professional Consult (permissible substitution) (INDEPENDENT_AMBULATORY_CARE_PROVIDER_SITE_OTHER): Payer: Medicare Other | Admitting: Thoracic Surgery (Cardiothoracic Vascular Surgery)

## 2010-08-10 ENCOUNTER — Encounter: Payer: Self-pay | Admitting: Thoracic Surgery (Cardiothoracic Vascular Surgery)

## 2010-08-10 ENCOUNTER — Ambulatory Visit: Payer: Medicare Other | Admitting: Cardiology

## 2010-08-10 VITALS — BP 135/75 | HR 72 | Resp 16

## 2010-08-10 DIAGNOSIS — I350 Nonrheumatic aortic (valve) stenosis: Secondary | ICD-10-CM

## 2010-08-10 DIAGNOSIS — I359 Nonrheumatic aortic valve disorder, unspecified: Secondary | ICD-10-CM

## 2010-08-10 NOTE — Patient Instructions (Signed)
I have instructed the patient to avoid any strenuous physical activity and in particular to avoid getting overheated between now and the time of surgery. I do not think that he should be riding his motorcycle until after he has recovered from his surgery.

## 2010-08-10 NOTE — H&P (Signed)
Patient presents for evaluation of critical aortic stenosis. He was originally noted to have a heart murmur on routine physical exam. He has been followed by [name] with serial echocardiograms. The patient does admit to mild symptoms of exertional shortness of breath. This has gradually developed one to 2 years. He denies any history of chest discomfort either with activity or at rest. He denies any dizzy spells or syncope. He denies any palpitations. His exercise tolerance is still fairly good, although he does admit to exertional fatigue.  Most recent 2-D echocardiogram was performed 01/27/2010. By report there was critical aortic stenosis. The PK mean transvalvular gradients were 88 and 56 mm mercury respectively the peak velocity was 469 cm/s. The valve area was estimated 0.58 cm. There was normal left ventricular systolic function with ejection fraction estimated at 55-60%. There was mild left ventricular hypertrophy and mild mitral regurgitation and mild aortic regurgitation. There was calcification of the mitral annulus.  Left and right heart catheterization was performed by Dr. Antoine Poche this past week. The gradient across the aortic valve was not assessed. There was moderate coronary artery disease with 50-60% stenosis in the left anterior descending coronary artery, and osteoarthritic percent stenosis of the left circumflex coronary artery with a left dominant coronary circulation. Pulmonary artery pressures measured 58/22 with primary A wedge pressure of 19. Cardiac output measured 4.7 L per minute corresponding to a cardiac index of 2.3. Central venous pressure was 6.  Physical exam is notable for a well-appearing gentleman who appears somewhat younger than stated age. HEENT exam is unrevealing. He is edentulous there is a well-healed scar on the right neck from carotid endarterectomy. There are carotid bruits. Breath sounds are clear and symmetrical bilaterally. Cardiovascular exam is  notable for regular rate and rhythm with grade 3/6 crescendo decrescendo systolic murmur heard across the precordium, best at the left lower sternal border. The abdomen is soft non-distended and non-tender. There are no palpable masses and bowel sounds are present. The extremities are warm and well perfused. Femoral pulses are palpable bilaterally. Lower extremity pulses are palpable in the posterior tibial position. There is no sign of any significant venous insufficiency. The skin is clean and dry and healthy throughout. Neurologic examination is grossly nonfocal. The remainder of his physical exam is unrevealing.  Impression: Critical aortic stenosis with mild exertional shortness of breath and normal left ventricular function. I agree that the patient needs aortic valve replacement. He has moderate coronary artery disease and we will need to review his catheter films to discuss whether or not coronary artery bypass grafting would be beneficial. In some views the blockage at the ostium of the left circumflex appeared significant. The history with regard to her previous coronary artery stent is unclear. The patient will be at somewhat increased risk for stroke 2 to his history of significant cerebrovascular disease and the presence of significant calcification in the aortic root noted on catheterization. Overall I expect that he will likely do well with the surgery.  Plan: I discussed matters at length with the patient and his wife in the office today. We discussed matters for more than 45 minutes including the indications, risks, and potential benefits of surgery. Alternative treatment strategies have been discussed. He understands and accepts all potential associated with the surgery and desires to proceed in the near future. He understands that we will plan to replace his valve using a bioprosthetic tissue valve. He understands that there is a small risk of late structural valve  deterioration and failure,  although this would be unlikely given his age. All of his questions been addressed. We tentatively plan to proceed with surgery on September 5.

## 2010-08-14 NOTE — Cardiovascular Report (Signed)
  [name], [name] NO.:  [id number]  MEDICAL RECORD NO.:  [id number]  LOCATION:                                 FACILITY:  PHYSICIAN:  Rollene Rotunda, MD, [facility]    DATE OF BIRTH:  DATE OF PROCEDURE:  08/12/2010 DATE OF DISCHARGE:                           CARDIAC CATHETERIZATION   PRIMARY PHYSICIAN:  Dr. [name].  CARDIOLOGIST:  Rollene Rotunda, MD, [facility]  PROCEDURE:  Left and right heart catheterization with coronary arteriography.  PROCEDURE NOTE:  Left heart catheterization was performed via the left femoral artery, right heart catheterization was performed via the left femoral vein.  Both vessels were cannulated using the anterior wall puncture.  A #4 French arterial sheath was inserted via the modified Seldinger technique, #7 French venous sheath was inserted via the modified Seldinger technique.  Eventually, I did upgrade to a 5-French arterial system to try to adequately fill the coronaries.  The patienttolerated procedure well and left the lab in stable condition.  RESULTS:  Hemodynamics RA mean 6, RV 69/90, pulmonary capillary pressure mean 19, LV not injected, AO 172/76, cardiac output/cardiac index 4.7/2.3 (Fick cath) .  Coronaries heavily calcified, left main had diffuse luminal irregularities, the LAD had proximal mid calcification. There was long proximal 30-40% stenosis.  There was mid 50-60% stenosis. There were diffuse mid and distal luminal irregularities.  The mid diagonal was large with proximal 25% stenosis.  The circumflex was a dominant vessel.  There appeared to be ostial 50% stenosis.  The mid obtuse marginal was large with a previous stent.  There was ostial 30% stenosis.  The stent was patent.  However, there appeared to be about an 80% filling defect after the stent.  The right coronary artery was nondominant. Left ventricle.  The left ventricle was not injected.  Minimal attempts to pass a straight wire one successful.  Aortic  root and aortic root shot was obtained.  There was no significant dilatation.  There was 2 to 3+ aortic insufficiency.  There was a heavy valve calcification.  CONCLUSION:  Two-vessel coronary artery disease.  Severe aortic stenosis documented by echo with some aortic insufficiency.  Heavy calcification.  PLAN:  The patient will be referred to Cardiovascular Surgery for aortic valve replacement and CABG.     Rollene Rotunda, MD, [facility]     JH/MEDQ  D:  08/07/2010  T:  08/08/2010  Job:  [id number]  cc:   Dr. [name] Dr. Cornelius Moras  Electronically Signed by Rollene Rotunda MD [facility] on 08/14/2010 07:50:52 PM

## 2010-08-27 ENCOUNTER — Ambulatory Visit (INDEPENDENT_AMBULATORY_CARE_PROVIDER_SITE_OTHER): Payer: Medicare Other | Admitting: Thoracic Surgery (Cardiothoracic Vascular Surgery)

## 2010-08-27 ENCOUNTER — Encounter: Payer: Self-pay | Admitting: Thoracic Surgery (Cardiothoracic Vascular Surgery)

## 2010-08-27 VITALS — BP 134/70 | HR 58

## 2010-08-27 DIAGNOSIS — I350 Nonrheumatic aortic (valve) stenosis: Secondary | ICD-10-CM

## 2010-08-27 DIAGNOSIS — I2510 Atherosclerotic heart disease of native coronary artery without angina pectoris: Secondary | ICD-10-CM

## 2010-08-27 DIAGNOSIS — I359 Nonrheumatic aortic valve disorder, unspecified: Secondary | ICD-10-CM

## 2010-08-27 NOTE — Patient Instructions (Signed)
Discontinue taking Plavix 2 weeks prior to surgery.

## 2010-08-27 NOTE — Progress Notes (Signed)
Patient returns for followup of critical aortic stenosis and coronary artery disease. He tentatively plans to proceed with aortic valve replacement and coronary artery bypass grafting on Wednesday, September 5. He was originally seen in consultation on July 23. Since then he has remained completely stable. He reports no new problems or complaints. His physical examination is unchanged from previous. He does have some concerns regarding whether or not he may need to receive any type of blood products during or following his surgery. We reviewed the indications, risks, and potential benefits of surgery again at length. He understands that we will make every effort to avoid the need to transfuse him with any blood products. However, he does understand that there is a chance that he may require blood transfusion. He does not have any problem with this if it is absolutely necessary. We discussed the risk of transfusion related transmission of blood-borne pathogens at length. All of his questions have been addressed. We spent in excess of 30 minutes discussing matters. He will discontinue taking Plavix 2 weeks prior to surgery.

## 2010-09-18 ENCOUNTER — Encounter (HOSPITAL_COMMUNITY)
Admission: RE | Admit: 2010-09-18 | Discharge: 2010-09-18 | Disposition: A | Discharge status: Home / Self Care | Payer: Medicare Other | Source: Ambulatory Visit | Attending: Thoracic Surgery (Cardiothoracic Vascular Surgery) | Admitting: Thoracic Surgery (Cardiothoracic Vascular Surgery)

## 2010-09-18 ENCOUNTER — Ambulatory Visit (HOSPITAL_COMMUNITY)
Admission: RE | Admit: 2010-09-18 | Discharge: 2010-09-18 | Disposition: A | Discharge status: Home / Self Care | Payer: Medicare Other | Source: Ambulatory Visit | Attending: Thoracic Surgery (Cardiothoracic Vascular Surgery) | Admitting: Thoracic Surgery (Cardiothoracic Vascular Surgery)

## 2010-09-18 ENCOUNTER — Ambulatory Visit (HOSPITAL_COMMUNITY): Payer: Medicare Other

## 2010-09-18 ENCOUNTER — Ambulatory Visit: Payer: Medicare Other | Admitting: Thoracic Surgery (Cardiothoracic Vascular Surgery)

## 2010-09-18 ENCOUNTER — Ambulatory Visit (INDEPENDENT_AMBULATORY_CARE_PROVIDER_SITE_OTHER): Payer: Medicare Other | Admitting: Surgical

## 2010-09-18 ENCOUNTER — Other Ambulatory Visit: Payer: Self-pay | Admitting: Thoracic Surgery (Cardiothoracic Vascular Surgery)

## 2010-09-18 VITALS — BP 129/80 | HR 48 | Temp 96.9°F | Resp 14

## 2010-09-18 DIAGNOSIS — I2510 Atherosclerotic heart disease of native coronary artery without angina pectoris: Secondary | ICD-10-CM | POA: Insufficient documentation

## 2010-09-18 DIAGNOSIS — Z01818 Encounter for other preprocedural examination: Secondary | ICD-10-CM | POA: Insufficient documentation

## 2010-09-18 DIAGNOSIS — I359 Nonrheumatic aortic valve disorder, unspecified: Secondary | ICD-10-CM

## 2010-09-18 DIAGNOSIS — Z01812 Encounter for preprocedural laboratory examination: Secondary | ICD-10-CM | POA: Insufficient documentation

## 2010-09-18 DIAGNOSIS — Z01810 Encounter for preprocedural cardiovascular examination: Secondary | ICD-10-CM | POA: Insufficient documentation

## 2010-09-18 DIAGNOSIS — I350 Nonrheumatic aortic (valve) stenosis: Secondary | ICD-10-CM

## 2010-09-18 LAB — COMPREHENSIVE METABOLIC PANEL
ALT: 10 U/L (ref 0–53)
Alkaline Phosphatase: 111 U/L (ref 39–117)
BUN: 14 mg/dL (ref 6–23)
CO2: 29 mEq/L (ref 19–32)
Chloride: 104 mEq/L (ref 96–112)
GFR calc Af Amer: >60 mL/min (ref >60)
GFR calc non Af Amer: >60 mL/min (ref >60)
Glucose, Bld: 98 mg/dL (ref 70–99)
Potassium: 4.8 mEq/L (ref 3.5–5.1)
Total Bilirubin: 0.6 mg/dL (ref 0.3–1.2)
Total Protein: 7.7 g/dL (ref 6.0–8.3)

## 2010-09-18 LAB — URINALYSIS, ROUTINE W REFLEX MICROSCOPIC
Hgb urine dipstick: NEGATIVE
Ketones, ur: NEGATIVE mg/dL
Protein, ur: NEGATIVE mg/dL
Urobilinogen, UA: 1 mg/dL (ref 0.0–1.0)

## 2010-09-18 LAB — CBC
HCT: 37.2 % — ABNORMAL LOW (ref 39.0–52.0)
MCH: 30.8 pg (ref 26.0–34.0)
MCHC: 34.1 g/dL (ref 30.0–36.0)
RDW: 13.3 % (ref 11.5–15.5)

## 2010-09-18 LAB — BLOOD GAS, ARTERIAL
FIO2: 0.21 %
Patient temperature: 98.6
pH, Arterial: 7.401 (ref 7.350–7.450)

## 2010-09-18 LAB — PROTIME-INR: Prothrombin Time: 13.3 seconds (ref 11.6–15.2)

## 2010-09-18 NOTE — Progress Notes (Signed)
PCP is Oliver Barre, MD, MD Referring Provider is Oliver Barre, MD  Chief Complaint  Patient presents with  . Aortic Stenosis  . Coronary Artery Disease    HPI the patient is a 77 year old gentleman who has recently been seen and evaluated by Dr. Cornelius Moras and. Dr. Antoine Poche for aortic stenosis and severe coronary artery disease. Over the past approximately 2 years the patient has slowly developed worsening symptoms of shortness of breath. Most recently he has become worse and he has been seen in cardiology consultation by Dr.Hochrein.  He has undergone a series of testing to include cardiac catheterization. On catheterization he was found to have an aortic valve area of 0.58 m. There is a peak gradient across the valve of 80 mm of mercury. Ejection fraction is normal at 55-60%. Coronary angiography reveals significant lesions in the LAD of approximately 50-60% as well as an ostial left circumflex lesion of 50%. Capillary wedge pressure was measured at 19 pulmonary artery pressure was 58/22. Cardiac output was measured at 4.7 and central venous pressure is 6. He was seen in surgical consultation by Dr. ON and he feels that the patient is a candidate for bioprosthetic aortic valve replacement. Additionally he is being considered for coronary artery bypass grafting. The patient denies any recent chest pain symptoms. He also denies syncope or presyncope  symptoms. He does admit to exertional fatigue which has worsened over time. He denies orthopnea or paroxysmal nocturnal dyspnea. He does have an occasional cough which is nonproductive. He denies peripheral edema. He denies palpitations.    Past Medical History  Diagnosis Date  . ADENOCARCINOMA, PROSTATE 08/06/2008  . ALLERGIC RHINITIS 06/02/2007  . Aortic valve disorders 07/01/2008  . B12 DEFICIENCY 06/05/2008  . BENIGN PROSTATIC HYPERTROPHY 06/02/2007  . CAROTID ARTERY DISEASE 04/29/2009  . CEREBROVASCULAR ACCIDENT, HX OF 06/02/2007  . CHEST PAIN 06/04/2008  .  CORONARY ARTERY DISEASE 11/09/2006  . DIVERTICULOSIS, COLON 06/02/2007  . FATIGUE 06/02/2007  . GERD 06/02/2007  . HEMORRHOIDS 07/31/2008  . HIATAL HERNIA 07/31/2008  . HYPERLIPIDEMIA 11/09/2006  . HYPERTENSION 11/09/2006  . OSTEOPENIA 06/02/2007  . PEPTIC ULCER DISEASE 06/02/2007  . PERIPHERAL VASCULAR DISEASE 11/09/2006  . Personal history of alcoholism 07/31/2008  . PROSTATE CANCER, HX OF 11/09/2006  . PUD, HX OF 07/31/2008  . TRANSIENT ISCHEMIC ATTACK 04/16/2009  . Shingles     Past Surgical History  Procedure Date  . Popliteal synovial cyst excision   . Right carotid endarterectomy 1999    Left carotid total chronic occlusion  . Prostate surgery   . Cardic stent 2004    Family History  Problem Relation Age of Onset  . Colon cancer Mother 58  . Diabetes Sister   . Hypertension Sister   . Coronary artery disease Sister   . Multiple sclerosis Sister   . Stroke Brother     Social History History  Substance Use Topics  . Smoking status: Former Games developer  . Smokeless tobacco: Not on file  . Alcohol Use: 21.0 oz/week    7 Glasses of wine, 28 Cans of beer per week    Current Outpatient Prescriptions  Medication Sig Dispense Refill  . alendronate (FOSAMAX) 70 MG tablet Take 70 mg by mouth every 7 (seven) days. Take with a full glass of water on an empty stomach.       Marland Kitchen atorvastatin (LIPITOR) 20 MG tablet Take 20 mg by mouth daily.        . clopidogrel (PLAVIX) 75 MG tablet Take 75  mg by mouth daily. plavix stopped august 24      . cyanocobalamin (,VITAMIN B-12,) 1000 MCG/ML injection Inject 1,000 mcg into the muscle every 30 (thirty) days.        . fexofenadine (ALLEGRA) 180 MG tablet Take 180 mg by mouth daily.        . metoprolol (TOPROL-XL) 50 MG 24 hr tablet Take 50 mg by mouth daily.        Marland Kitchen omeprazole (PRILOSEC) 40 MG capsule Take 40 mg by mouth daily.        . valsartan (DIOVAN) 80 MG tablet Take 80 mg by mouth daily.         Current Facility-Administered Medications    Medication Dose Route Frequency Provider Last Rate Last Dose  . pneumococcal 23 valent vaccine (PNU-IMMUNE) injection 0.5 mL  0.5 mL Intramuscular Once Oliver Barre, MD        Allergies  Allergen Reactions  . Tolterodine Tartrate      Review of Systems  with the exception of the above-mentioned symptoms his review of symptoms is unremarkable with the exception of frequent urination and nocturia.  BP 129/80  Pulse 48  Temp 96.9 F (36.1 C)  Resp 14  SpO2 98% Physical Exam  general appearance: Well-developed elderly male in no acute distress. HEENT examination: Normocephalic atraumatic pupils equal and round reactive. He has full dentures. Pharynx is unremarkable. Neck examination: No carotid bruits. No lymphadenopathy. No jugular venous distention. Trachea is midline. Neck is supple. Pulmonary examination: Lungs are clear to auscultation. Cardiac examination: 3/6 systolic murmur regular rate and rhythm no gallops. Abdominal examination: soft nontender normal active bowel sounds no organomegaly. Extremities: No clubbing cyanosis or edema. Skin: No rashes. Neurological examination: Grossly nonfocal. Vascular examination: Palpable peripheral pulses. Genitourinary examination: Not examined. Rectal examination: deferred.   Diagnostic Tests there are no new diagnostic tests.  Impression severe aortic stenosis and two-vessel coronary artery disease.  Plan aortic valve replacement and coronary artery bypass grafting per Dr. Tressie Stalker.

## 2010-09-18 NOTE — Patient Instructions (Signed)
AS instructed regarding arrival time for surgery.

## 2010-09-18 NOTE — Progress Notes (Signed)
PCP is Oliver Barre, MD, MD Referring Provider is Oliver Barre, MD  No chief complaint on file.   HPI the patient is a 77 year old gentleman who has recently been seen and evaluated by Dr. Cornelius Moras and. Dr. Antoine Poche for aortic stenosis and severe coronary artery disease. Over the past approximately 2 years the patient has slowly developed worsening symptoms of shortness of breath. Most recently he has become worse and he has been seen in cardiology consultation by Dr.Hochrein.  He has undergone a series of testing to include cardiac catheterization. On catheterization he was found to have an aortic valve area of 0.58 m. There is a peak gradient across the valve of 80 mm of mercury. Ejection fraction is normal at 55-60%. Coronary angiography reveals significant lesions in the LAD of approximately 50-60% as well as an ostial left circumflex lesion of 50%. Capillary wedge pressure was measured at 19 pulmonary artery pressure was 58/22. Cardiac output was measured at 4.7 and central venous pressure is 6. He was seen in surgical consultation by Dr. ON and he feels that the patient is a candidate for bioprosthetic aortic valve replacement. Additionally he is being considered for coronary artery bypass grafting. The patient denies any recent chest pain symptoms. He also denies syncope or presyncope  symptoms. He does admit to exertional fatigue which has worsened over time. He denies orthopnea or paroxysmal nocturnal dyspnea. He does have an occasional cough which is nonproductive. He denies peripheral edema. He denies palpitations.    Past Medical History  Diagnosis Date  . ADENOCARCINOMA, PROSTATE 08/06/2008  . ALLERGIC RHINITIS 06/02/2007  . Aortic valve disorders 07/01/2008  . B12 DEFICIENCY 06/05/2008  . BENIGN PROSTATIC HYPERTROPHY 06/02/2007  . CAROTID ARTERY DISEASE 04/29/2009  . CEREBROVASCULAR ACCIDENT, HX OF 06/02/2007  . CHEST PAIN 06/04/2008  . CORONARY ARTERY DISEASE 11/09/2006  . DIVERTICULOSIS, COLON  06/02/2007  . FATIGUE 06/02/2007  . GERD 06/02/2007  . HEMORRHOIDS 07/31/2008  . HIATAL HERNIA 07/31/2008  . HYPERLIPIDEMIA 11/09/2006  . HYPERTENSION 11/09/2006  . OSTEOPENIA 06/02/2007  . PEPTIC ULCER DISEASE 06/02/2007  . PERIPHERAL VASCULAR DISEASE 11/09/2006  . Personal history of alcoholism 07/31/2008  . PROSTATE CANCER, HX OF 11/09/2006  . PUD, HX OF 07/31/2008  . TRANSIENT ISCHEMIC ATTACK 04/16/2009  . Shingles     Past Surgical History  Procedure Date  . Popliteal synovial cyst excision   . Right carotid endarterectomy 1999    Left carotid total chronic occlusion  . Prostate surgery   . Cardic stent 2004    Family History  Problem Relation Age of Onset  . Colon cancer Mother 79  . Diabetes Sister   . Hypertension Sister   . Coronary artery disease Sister   . Multiple sclerosis Sister   . Stroke Brother     Social History History  Substance Use Topics  . Smoking status: Former Games developer  . Smokeless tobacco: Not on file  . Alcohol Use: 21.0 oz/week    7 Glasses of wine, 28 Cans of beer per week    Current Outpatient Prescriptions  Medication Sig Dispense Refill  . alendronate (FOSAMAX) 70 MG tablet Take 70 mg by mouth every 7 (seven) days. Take with a full glass of water on an empty stomach.       Marland Kitchen atorvastatin (LIPITOR) 20 MG tablet Take 20 mg by mouth daily.        . clopidogrel (PLAVIX) 75 MG tablet Take 75 mg by mouth daily.        Marland Kitchen  cyanocobalamin (,VITAMIN B-12,) 1000 MCG/ML injection Inject 1,000 mcg into the muscle every 30 (thirty) days.        . fexofenadine (ALLEGRA) 180 MG tablet Take 180 mg by mouth daily.        . metoprolol (TOPROL-XL) 50 MG 24 hr tablet Take 50 mg by mouth daily.        Marland Kitchen omeprazole (PRILOSEC) 40 MG capsule Take 40 mg by mouth daily.        . valsartan (DIOVAN) 80 MG tablet Take 80 mg by mouth daily.         Current Facility-Administered Medications  Medication Dose Route Frequency Provider Last Rate Last Dose  . pneumococcal 23  valent vaccine (PNU-IMMUNE) injection 0.5 mL  0.5 mL Intramuscular Once Oliver Barre, MD        Allergies  Allergen Reactions  . Tolterodine Tartrate      Review of Systems with the exception of the above-mentioned symptoms his review of symptoms is unremarkable with the exception of frequent urination and nocturia.  There were no vitals taken for this visit. Physical Exam general appearance: Well-developed elderly male in no acute distress. HEENT examination: Normocephalic atraumatic pupils equal and round reactive. He has full dentures. Pharynx is unremarkable. Neck examination: No carotid bruits. No lymphadenopathy. No jugular venous distention. Trachea is midline. Neck is supple. Pulmonary examination: Lungs are clear to auscultation. Cardiac examination: 3/6 systolic murmur regular rate and rhythm no gallops. Abdominal examination: soft nontender normal active bowel sounds no organomegaly. Extremities: No clubbing cyanosis or edema. Skin: No rashes. Neurological examination: Grossly nonfocal. Vascular examination: Palpable peripheral pulses. Genitourinary examination: Not examined. Rectal examination: deferred.   Diagnostic Tests there are no new diagnostic tests.  Impression severe aortic stenosis and two-vessel coronary artery disease.   Plan aortic valve replacement and coronary artery bypass grafting per Dr. Tressie Stalker.

## 2010-09-19 LAB — HEMOGLOBIN A1C
Hgb A1c MFr Bld: 5.6 % (ref <5.7)
Mean Plasma Glucose: 114 mg/dL (ref <117)

## 2010-09-23 ENCOUNTER — Inpatient Hospital Stay (HOSPITAL_COMMUNITY): Payer: Medicare Other

## 2010-09-23 ENCOUNTER — Other Ambulatory Visit: Payer: Self-pay | Admitting: Thoracic Surgery (Cardiothoracic Vascular Surgery)

## 2010-09-23 ENCOUNTER — Inpatient Hospital Stay (HOSPITAL_COMMUNITY)
Admission: RE | Admit: 2010-09-23 | Discharge: 2010-10-09 | DRG: 219 | Disposition: A | Discharge status: Home Health | Payer: Medicare Other | Source: Ambulatory Visit | Attending: Thoracic Surgery (Cardiothoracic Vascular Surgery) | Admitting: Thoracic Surgery (Cardiothoracic Vascular Surgery)

## 2010-09-23 DIAGNOSIS — N400 Enlarged prostate without lower urinary tract symptoms: Secondary | ICD-10-CM | POA: Diagnosis present

## 2010-09-23 DIAGNOSIS — R404 Transient alteration of awareness: Secondary | ICD-10-CM | POA: Diagnosis present

## 2010-09-23 DIAGNOSIS — E876 Hypokalemia: Secondary | ICD-10-CM | POA: Diagnosis not present

## 2010-09-23 DIAGNOSIS — E785 Hyperlipidemia, unspecified: Secondary | ICD-10-CM | POA: Diagnosis present

## 2010-09-23 DIAGNOSIS — I2510 Atherosclerotic heart disease of native coronary artery without angina pectoris: Secondary | ICD-10-CM | POA: Diagnosis present

## 2010-09-23 DIAGNOSIS — I4891 Unspecified atrial fibrillation: Secondary | ICD-10-CM | POA: Diagnosis not present

## 2010-09-23 DIAGNOSIS — Z952 Presence of prosthetic heart valve: Secondary | ICD-10-CM

## 2010-09-23 DIAGNOSIS — Z951 Presence of aortocoronary bypass graft: Secondary | ICD-10-CM | POA: Insufficient documentation

## 2010-09-23 DIAGNOSIS — R5381 Other malaise: Secondary | ICD-10-CM | POA: Diagnosis present

## 2010-09-23 DIAGNOSIS — Z01812 Encounter for preprocedural laboratory examination: Secondary | ICD-10-CM

## 2010-09-23 DIAGNOSIS — F17200 Nicotine dependence, unspecified, uncomplicated: Secondary | ICD-10-CM | POA: Diagnosis present

## 2010-09-23 DIAGNOSIS — I359 Nonrheumatic aortic valve disorder, unspecified: Principal | ICD-10-CM | POA: Diagnosis present

## 2010-09-23 DIAGNOSIS — D62 Acute posthemorrhagic anemia: Secondary | ICD-10-CM | POA: Diagnosis not present

## 2010-09-23 DIAGNOSIS — Z7982 Long term (current) use of aspirin: Secondary | ICD-10-CM

## 2010-09-23 DIAGNOSIS — T8859XA Other complications of anesthesia, initial encounter: Secondary | ICD-10-CM

## 2010-09-23 DIAGNOSIS — J9819 Other pulmonary collapse: Secondary | ICD-10-CM | POA: Diagnosis not present

## 2010-09-23 DIAGNOSIS — G9341 Metabolic encephalopathy: Secondary | ICD-10-CM | POA: Diagnosis not present

## 2010-09-23 DIAGNOSIS — I10 Essential (primary) hypertension: Secondary | ICD-10-CM | POA: Diagnosis present

## 2010-09-23 DIAGNOSIS — F1020 Alcohol dependence, uncomplicated: Secondary | ICD-10-CM | POA: Diagnosis present

## 2010-09-23 HISTORY — PX: AORTIC VALVE REPLACEMENT: SHX41

## 2010-09-23 HISTORY — PX: CORONARY ARTERY BYPASS GRAFT: SHX141

## 2010-09-23 HISTORY — DX: Presence of prosthetic heart valve: Z95.2

## 2010-09-23 HISTORY — DX: Other complications of anesthesia, initial encounter: T88.59XA

## 2010-09-23 LAB — POCT I-STAT 3, ART BLOOD GAS (G3+)
Acid-base deficit: 2 mmol/L (ref 0.0–2.0)
Bicarbonate: 19.2 mEq/L — ABNORMAL LOW (ref 20.0–24.0)
O2 Saturation: 100 %
O2 Saturation: 98 %
Patient temperature: 34.9
Patient temperature: 37
TCO2: 23 mmol/L (ref 0–100)
pCO2 arterial: 42.1 mmHg (ref 35.0–45.0)
pH, Arterial: 7.41 (ref 7.350–7.450)

## 2010-09-23 LAB — POCT I-STAT 4, (NA,K, GLUC, HGB,HCT)
Glucose, Bld: 96 mg/dL (ref 70–99)
Glucose, Bld: 134 mg/dL — ABNORMAL HIGH (ref 70–99)
HCT: 28 % — ABNORMAL LOW (ref 39.0–52.0)
HCT: 25 % — ABNORMAL LOW (ref 39.0–52.0)
HCT: 29 % — ABNORMAL LOW (ref 39.0–52.0)
Hemoglobin: 10.2 g/dL — ABNORMAL LOW (ref 13.0–17.0)
Hemoglobin: 8.5 g/dL — ABNORMAL LOW (ref 13.0–17.0)
Hemoglobin: 9.9 g/dL — ABNORMAL LOW (ref 13.0–17.0)
Potassium: 3.6 mEq/L (ref 3.5–5.1)
Potassium: 4.3 mEq/L (ref 3.5–5.1)
Sodium: 139 mEq/L (ref 135–145)
Sodium: 136 mEq/L (ref 135–145)
Sodium: 137 mEq/L (ref 135–145)

## 2010-09-23 LAB — POCT I-STAT, CHEM 8
Creatinine, Ser: 0.9 mg/dL (ref 0.50–1.35)
HCT: 24 % — ABNORMAL LOW (ref 39.0–52.0)
Hemoglobin: 8.2 g/dL — ABNORMAL LOW (ref 13.0–17.0)
Potassium: 4.3 mEq/L (ref 3.5–5.1)
Sodium: 139 mEq/L (ref 135–145)

## 2010-09-23 LAB — HEMOGLOBIN AND HEMATOCRIT, BLOOD: Hemoglobin: 8.3 g/dL — ABNORMAL LOW (ref 13.0–17.0)

## 2010-09-23 LAB — APTT: aPTT: 42 seconds — ABNORMAL HIGH (ref 24–37)

## 2010-09-23 LAB — CBC
HCT: 27.3 % — ABNORMAL LOW (ref 39.0–52.0)
Hemoglobin: 9.4 g/dL — ABNORMAL LOW (ref 13.0–17.0)
MCH: 30.9 pg (ref 26.0–34.0)
MCHC: 35 g/dL (ref 30.0–36.0)
Platelets: 121 10*3/uL — ABNORMAL LOW (ref 150–400)
RDW: 13.1 % (ref 11.5–15.5)
RDW: 13.2 % (ref 11.5–15.5)
WBC: 9 10*3/uL (ref 4.0–10.5)

## 2010-09-23 LAB — CREATININE, SERUM: Creatinine, Ser: 0.85 mg/dL (ref 0.50–1.35)

## 2010-09-23 LAB — GLUCOSE, CAPILLARY
Glucose-Capillary: 123 mg/dL — ABNORMAL HIGH (ref 70–99)
Glucose-Capillary: 121 mg/dL — ABNORMAL HIGH (ref 70–99)
Glucose-Capillary: 116 mg/dL — ABNORMAL HIGH (ref 70–99)

## 2010-09-23 LAB — TYPE AND SCREEN: ABO/RH(D): O NEG

## 2010-09-24 ENCOUNTER — Inpatient Hospital Stay (HOSPITAL_COMMUNITY): Payer: Medicare Other

## 2010-09-24 LAB — BASIC METABOLIC PANEL
Chloride: 107 mEq/L (ref 96–112)
Creatinine, Ser: 0.88 mg/dL (ref 0.50–1.35)
GFR calc Af Amer: >60 mL/min (ref >60)
GFR calc non Af Amer: >60 mL/min (ref >60)
Potassium: 4.5 mEq/L (ref 3.5–5.1)

## 2010-09-24 LAB — CBC
Hemoglobin: 9.1 g/dL — ABNORMAL LOW (ref 13.0–17.0)
MCH: 30.8 pg (ref 26.0–34.0)
MCHC: 35.5 g/dL (ref 30.0–36.0)
Platelets: 108 10*3/uL — ABNORMAL LOW (ref 150–400)
Platelets: 110 10*3/uL — ABNORMAL LOW (ref 150–400)
RBC: 2.95 MIL/uL — ABNORMAL LOW (ref 4.22–5.81)
RDW: 13.3 % (ref 11.5–15.5)
WBC: 9.3 10*3/uL (ref 4.0–10.5)
WBC: 10.3 10*3/uL (ref 4.0–10.5)

## 2010-09-24 LAB — POCT I-STAT, CHEM 8
Creatinine, Ser: 1 mg/dL (ref 0.50–1.35)
Hemoglobin: 9.5 g/dL — ABNORMAL LOW (ref 13.0–17.0)
Potassium: 3.8 mEq/L (ref 3.5–5.1)
Sodium: 138 mEq/L (ref 135–145)

## 2010-09-24 LAB — GLUCOSE, CAPILLARY
Glucose-Capillary: 136 mg/dL — ABNORMAL HIGH (ref 70–99)
Glucose-Capillary: 149 mg/dL — ABNORMAL HIGH (ref 70–99)
Glucose-Capillary: 119 mg/dL — ABNORMAL HIGH (ref 70–99)
Glucose-Capillary: 118 mg/dL — ABNORMAL HIGH (ref 70–99)

## 2010-09-24 LAB — CREATININE, SERUM: Creatinine, Ser: 0.96 mg/dL (ref 0.50–1.35)

## 2010-09-24 LAB — MAGNESIUM: Magnesium: 2.6 mg/dL — ABNORMAL HIGH (ref 1.5–2.5)

## 2010-09-25 ENCOUNTER — Inpatient Hospital Stay (HOSPITAL_COMMUNITY): Payer: Medicare Other

## 2010-09-25 LAB — GLUCOSE, CAPILLARY
Glucose-Capillary: 144 mg/dL — ABNORMAL HIGH (ref 70–99)
Glucose-Capillary: 110 mg/dL — ABNORMAL HIGH (ref 70–99)

## 2010-09-25 LAB — BASIC METABOLIC PANEL
BUN: 13 mg/dL (ref 6–23)
Chloride: 101 mEq/L (ref 96–112)
GFR calc non Af Amer: >60 mL/min (ref >60)
Glucose, Bld: 143 mg/dL — ABNORMAL HIGH (ref 70–99)
Potassium: 3.6 mEq/L (ref 3.5–5.1)
Sodium: 135 mEq/L (ref 135–145)

## 2010-09-25 LAB — CBC
HCT: 25.3 % — ABNORMAL LOW (ref 39.0–52.0)
Hemoglobin: 8.8 g/dL — ABNORMAL LOW (ref 13.0–17.0)
MCHC: 34.8 g/dL (ref 30.0–36.0)
RBC: 2.85 MIL/uL — ABNORMAL LOW (ref 4.22–5.81)
WBC: 10.9 10*3/uL — ABNORMAL HIGH (ref 4.0–10.5)

## 2010-09-25 NOTE — Op Note (Signed)
[name], HEMANN NO.:  [id number]  MEDICAL RECORD NO.:  [id number]  LOCATION:  2301                         FACILITY:  [facility]  PHYSICIAN:  [name]. [name], M.D. DATE OF BIRTH:  [birth year]  DATE OF PROCEDURE:  09/23/2010 DATE OF DISCHARGE:                              OPERATIVE REPORT   PREOPERATIVE DIAGNOSES: 1. Severe aortic stenosis. 2. Coronary artery disease.  POSTOPERATIVE DIAGNOSES: 1. Severe aortic stenosis. 2. Coronary artery disease.  PROCEDURE:  Median sternotomy for aortic valve replacement (23-mm [facility] Ease pericardial tissue valve) and coronary artery bypass grafting x1 (left internal mammary artery to distal left anterior descending coronary artery)  SURGEON:  [name]. [name] Moras, MD  ASSISTANT:  Rowe Clack, PA-C  ANESTHESIA:  [name] Petit, MD.  BRIEF CLINICAL NOTE:  The patient is an 77 year old male who has been followed by [name] with known history of aortic stenosis. The patient has developed worsening symptoms of exertional shortness of breath and fatigue over the past 2 years.  Followup echocardiogram demonstrates progression of aortic stenosis now with severe aortic stenosis with peak and mean transvalvular gradients estimated 88 and 56 mmHg respectively.  The peak velocity across the valve was 469 cm/sec and the calculated aortic valve area was estimated 0.58 cm2.  There is normal left ventricular systolic function with ejection fraction estimated 55-60%.  Left and right heart catheterization was performed demonstrating moderate coronary artery disease with tubular 50-60% stenosis of the left anterior descending coronary artery.  There is moderate pulmonary hypertension.  A full consultation has been dictated previously.  The patient has been counseled regarding the indications, risks, and potential benefits of surgery.  Alternatives of treatment strategies have been discussed.  The patient provides  full informed consent for the procedure as described.  OPERATIVE FINDINGS: 1. Severe calcific aortic stenosis. 2. Normal left ventricular systolic function with moderate left     ventricular hypertrophy. 3. Good-quality left internal mammary artery conduit for grafting. 4. Good-quality target vessel for coronary artery bypass grafting with     visible and palpable plaque throughout the proximal left anterior     descending coronary artery.  OPERATIVE PROCEDURE IN DETAIL:  The patient was brought to the operating room on the above-mentioned date and central monitoring was established by the Anesthesia Team under the care and direction of Dr. Judie [name].  Specifically, a Swan-Ganz catheter is placed through the right internal jugular approach.  A radial arterial line was placed. Intravenous antibiotics were administered.  The patient was placed in the supine position on the operating table.  General endotracheal anesthesia was induced uneventfully.  A Foley catheter was placed.  The patient's chest, abdomen, both groins, and both lower extremities were prepared and draped in a sterile manner.  Baseline transesophageal echocardiogram was performed by Dr. Randa [name]. This confirmed the presence of severe calcific aortic stenosis.  There was moderate aortic insufficiency.  There was mild-to-moderate mitral regurgitation.  There was normal left ventricular systolic function with moderate left ventricular hypertrophy.  A median sternotomy incision was performed and the left internal mammary artery was dissected from the chest wall and prepared for bypass grafting.  The left  internal mammary artery was good-quality conduit. Following systemic heparinization, left internal mammary artery was transected distally and noted to have excellent flow.  The pericardium was opened.  The ascending aorta was moderately diseased with atherosclerosis.  The ascending aorta was cannulated  for cardiopulmonary bypass.  A retrograde cardioplegic cannula was placed through the right atrium into the coronary sinus.  A conventional two- stage venous cannula was placed through the right atrial appendage. Cardiopulmonary bypass was begun and left ventricular vent was placed through the right superior pulmonary vein.  A temperature probe was placed in the anterior interventricular septum.  A cardioplegic cannula was placed directly in the ascending aorta.  The patient was allowed to cool passively to 32 degrees systemic temperature.  The aortic crossclamp was applied and cold blood cardioplegia was delivered in an antegrade fashion through the aortic root.  Iced saline slush was applied for topical hypothermia.  The initial cardioplegic arrest was rapid with early diastolic arrest. Repeat doses of cardioplegia were administered intermittently throughout the crossclamp portion of the operation through the aortic root or retrograde through the coronary sinus catheter to maintain completely flat electrocardiogram and left ventricular septal myocardial temperature below 15 degrees centigrade.  Myocardial protection was felt to be excellent.  Left internal mammary artery was grafted to the distal left anterior descending coronary artery in end-to-side fashion.  The left anterior descending coronary artery measured 1.5 mm in diameter and was a good- quality target vessel at the site of distal grafting.  A 1.5-mm probe will not pass retrograde up the proximal vessel but easily passed into the apex.  An oblique aortotomy incision was performed.  The aortic valve was inspected.  The aortic valve was tricuspid and severely calcified and stenotic.  There was severe calcification within the wall of the aortic root.  The aortic valve was excised sharply.  The aortic annulus was decalcified.  This was technically straightforward.  After sufficient decalcification, the aortic annulus was  sized to accept a 23-mm stented bioprosthetic tissue valve.  Aortic valve replacement was performed using interrupted 2-0 Ethibond horizontal mattress pledgeted sutures with pledgets in the subannular position.  The aortic valve was replaced using an [facility] Ease pericardial tissue valve (size 23 mm, model number 3300 TFX, serial number [id number]).  The valve seated easily and without difficulty. Rewarming was begun.  The aortotomy was closed using a two-layer closure of running 4-0 Prolene suture with Teflon felt strips to buttress the suture line.  The lungs were ventilated and the heart allowed to fill.  The left ventricular septal temperature rose rapidly with reperfusion of left internal mammary artery graft.  One final dose of warm retrograde hot shot cardioplegia was administered.  The aortic crossclamp was removed after a total crossclamp time of 84 minutes.  The heart began to beat spontaneously without need for cardioversion. The retrograde cardioplegic cannula was removed.  Epicardial pacing wires were fixed to the right ventricular free wall into the right atrial appendage.  The patient was rewarmed to 37 degrees centigrade temperature.  The left ventricular vent and the aortic root vent were both removed uneventfully.  The patient was weaned from cardiopulmonary bypass without difficulty.  The patient's rhythm at separation from bypass was AV paced.  No inotropic support was required.  Total cardiopulmonary bypass time for the operation was 110 minutes.  Follow-up transesophageal echocardiogram performed by Dr. Randa [name] after separation from bypass demonstrated normal left ventricular function. There was a well seated bioprosthetic tissue valve in  the aortic position that was functioning normally.  There was no sign of any aortic insufficiency.  There remained mild mitral regurgitation.  The aortic and venous cannulae were both removed uneventfully. Protamine was  administered to reverse anticoagulation.  The mediastinum and the left pleural space were irrigated with saline solution. Meticulous surgical hemostasis was ascertained.  The mediastinum and the left pleural space were drained with three chest tubes placed through separate stab incisions inferiorly.  The pericardium and the soft tissues anterior to the aorta were reapproximated loosely.  The sternum was closed using double-strength sternal wire.  The soft tissues anterior to the sternum were closed in multiple layers and the skin was closed with a running subcuticular skin closure.  The patient tolerated the procedure well and was transported directly to surgical intensive care unit in stable condition.  There were no intraoperative complications.  All sponge, instrument and needle counts were verified correct at the completion of the operation.  No blood products were administered.     [name]. [name], M.D.     CHO/MEDQ  D:  09/23/2010  T:  09/23/2010  Job:  [id number]  cc:   Rollene Rotunda, MD, [facility]  Electronically Signed by Tressie Stalker M.D. on 09/25/2010 05:33:08 AM

## 2010-09-26 ENCOUNTER — Inpatient Hospital Stay (HOSPITAL_COMMUNITY): Payer: Medicare Other

## 2010-09-26 LAB — GLUCOSE, CAPILLARY
Glucose-Capillary: 124 mg/dL — ABNORMAL HIGH (ref 70–99)
Glucose-Capillary: 102 mg/dL — ABNORMAL HIGH (ref 70–99)

## 2010-09-26 LAB — BASIC METABOLIC PANEL
Chloride: 99 mEq/L (ref 96–112)
GFR calc Af Amer: >60 mL/min (ref >60)
GFR calc non Af Amer: >60 mL/min (ref >60)
Potassium: 3.6 mEq/L (ref 3.5–5.1)
Sodium: 135 mEq/L (ref 135–145)

## 2010-09-26 LAB — CBC
Hemoglobin: 8.2 g/dL — ABNORMAL LOW (ref 13.0–17.0)
MCHC: 35.2 g/dL (ref 30.0–36.0)
RDW: 12.9 % (ref 11.5–15.5)
WBC: 8.5 10*3/uL (ref 4.0–10.5)

## 2010-09-27 LAB — TYPE AND SCREEN
ABO/RH(D): O NEG
Antibody Screen: NEGATIVE
Unit division: 0
Unit division: 0
Unit division: 0

## 2010-09-27 LAB — BASIC METABOLIC PANEL
Calcium: 8.8 mg/dL (ref 8.4–10.5)
GFR calc Af Amer: >60 mL/min (ref >60)
GFR calc non Af Amer: >60 mL/min (ref >60)
Sodium: 134 mEq/L — ABNORMAL LOW (ref 135–145)

## 2010-09-27 LAB — CBC
MCH: 30.5 pg (ref 26.0–34.0)
MCHC: 35.3 g/dL (ref 30.0–36.0)
Platelets: 132 10*3/uL — ABNORMAL LOW (ref 150–400)
RDW: 12.5 % (ref 11.5–15.5)

## 2010-09-28 ENCOUNTER — Inpatient Hospital Stay (HOSPITAL_COMMUNITY): Payer: Medicare Other

## 2010-09-28 LAB — POCT I-STAT, CHEM 8
Creatinine, Ser: 0.8 mg/dL (ref 0.50–1.35)
HCT: 30 % — ABNORMAL LOW (ref 39.0–52.0)
Hemoglobin: 10.2 g/dL — ABNORMAL LOW (ref 13.0–17.0)
Potassium: 4.3 mEq/L (ref 3.5–5.1)
Sodium: 133 mEq/L — ABNORMAL LOW (ref 135–145)

## 2010-09-28 LAB — CBC
MCH: 30.8 pg (ref 26.0–34.0)
Platelets: 197 10*3/uL (ref 150–400)
RBC: 3.31 MIL/uL — ABNORMAL LOW (ref 4.22–5.81)
WBC: 7.7 10*3/uL (ref 4.0–10.5)

## 2010-09-28 LAB — BASIC METABOLIC PANEL
CO2: 27 mEq/L (ref 19–32)
Calcium: 8.8 mg/dL (ref 8.4–10.5)
Chloride: 97 mEq/L (ref 96–112)
Sodium: 132 mEq/L — ABNORMAL LOW (ref 135–145)

## 2010-09-29 ENCOUNTER — Inpatient Hospital Stay (HOSPITAL_COMMUNITY): Payer: Medicare Other

## 2010-09-29 LAB — CBC
HCT: 26.9 % — ABNORMAL LOW (ref 39.0–52.0)
Hemoglobin: 9.5 g/dL — ABNORMAL LOW (ref 13.0–17.0)
MCH: 30.4 pg (ref 26.0–34.0)
MCHC: 35.3 g/dL (ref 30.0–36.0)
MCV: 86.2 fL (ref 78.0–100.0)
RBC: 3.12 MIL/uL — ABNORMAL LOW (ref 4.22–5.81)

## 2010-09-29 LAB — COMPREHENSIVE METABOLIC PANEL
ALT: 11 U/L (ref 0–53)
BUN: 15 mg/dL (ref 6–23)
CO2: 25 mEq/L (ref 19–32)
Calcium: 8.9 mg/dL (ref 8.4–10.5)
GFR calc Af Amer: >60 mL/min (ref >60)
GFR calc non Af Amer: >60 mL/min (ref >60)
Glucose, Bld: 135 mg/dL — ABNORMAL HIGH (ref 70–99)
Sodium: 131 mEq/L — ABNORMAL LOW (ref 135–145)
Total Protein: 6.8 g/dL (ref 6.0–8.3)

## 2010-09-29 LAB — PREALBUMIN: Prealbumin: 6 mg/dL — ABNORMAL LOW (ref 17.0–34.0)

## 2010-09-30 ENCOUNTER — Inpatient Hospital Stay (HOSPITAL_COMMUNITY): Payer: Medicare Other

## 2010-09-30 LAB — GLUCOSE, CAPILLARY: Glucose-Capillary: 104 mg/dL — ABNORMAL HIGH (ref 70–99)

## 2010-09-30 NOTE — Procedures (Signed)
EEG NUMBER:  REFERRING PHYSICIAN:  Darnelle Maffucci, MD  HISTORY:  An 77 year old male with altered mental status.  MEDICATIONS:  Lovenox, vitamins, Seroquel, Lopressor, clonidine, NovoLog, Pepcid, Haldol.  CONDITIONS OF RECORDING:  This is a 16-channel EEG carried out with the patient in the awake state.  DESCRIPTION:  The background activity is marred by muscle and movement artifact.  Despite this muscle and movement artifact, the majority of the background rhythm consists of a polymorphic delta rhythm.  There are occasions during the tracing where this polymorphic delta rhythm has a superimposed theta activity.  This theta activity reaches a maximum of 7 Hz and is poorly organized.  No epileptiform activity is noted. Hypoventilation was not performed.  Intermittent photic stimulation was not performed.  The patient was not noted to drowse asleep.  IMPRESSION:  This is an abnormal EEG secondary to posterior background slowing.  This finding is consistent with a diffuse disturbance that is etiologically nonspecific, but may include metabolic encephalopathy among other possibilities.  COMMENTS:  Muscle and movement artifact were prominent during the tracing and make subtle abnormalities difficult to discern.          ______________________________ Thana Farr, MD    ZO:[id number] D:  09/30/2010 17:10:50  T:  09/30/2010 22:03:20  Job #:  [id number]

## 2010-10-01 LAB — GLUCOSE, CAPILLARY
Glucose-Capillary: 118 mg/dL — ABNORMAL HIGH (ref 70–99)
Glucose-Capillary: 111 mg/dL — ABNORMAL HIGH (ref 70–99)
Glucose-Capillary: 127 mg/dL — ABNORMAL HIGH (ref 70–99)
Glucose-Capillary: 113 mg/dL — ABNORMAL HIGH (ref 70–99)

## 2010-10-01 LAB — BASIC METABOLIC PANEL
Calcium: 8.7 mg/dL (ref 8.4–10.5)
Creatinine, Ser: 0.75 mg/dL (ref 0.50–1.35)
GFR calc Af Amer: >60 mL/min (ref >60)
Sodium: 136 mEq/L (ref 135–145)

## 2010-10-01 LAB — CBC
MCH: 30.2 pg (ref 26.0–34.0)
MCV: 85.6 fL (ref 78.0–100.0)
Platelets: 229 10*3/uL (ref 150–400)
RDW: 12.7 % (ref 11.5–15.5)
WBC: 6.1 10*3/uL (ref 4.0–10.5)

## 2010-10-02 ENCOUNTER — Inpatient Hospital Stay (HOSPITAL_COMMUNITY): Payer: Medicare Other

## 2010-10-02 LAB — BASIC METABOLIC PANEL
BUN: 17 mg/dL (ref 6–23)
CO2: 24 mEq/L (ref 19–32)
Chloride: 102 mEq/L (ref 96–112)
Creatinine, Ser: 0.82 mg/dL (ref 0.50–1.35)
Glucose, Bld: 112 mg/dL — ABNORMAL HIGH (ref 70–99)

## 2010-10-02 LAB — GLUCOSE, CAPILLARY
Glucose-Capillary: 114 mg/dL — ABNORMAL HIGH (ref 70–99)
Glucose-Capillary: 113 mg/dL — ABNORMAL HIGH (ref 70–99)
Glucose-Capillary: 112 mg/dL — ABNORMAL HIGH (ref 70–99)

## 2010-10-02 LAB — CBC
HCT: 28.1 % — ABNORMAL LOW (ref 39.0–52.0)
Hemoglobin: 9.7 g/dL — ABNORMAL LOW (ref 13.0–17.0)
MCV: 86.7 fL (ref 78.0–100.0)
RBC: 3.24 MIL/uL — ABNORMAL LOW (ref 4.22–5.81)
WBC: 5.9 10*3/uL (ref 4.0–10.5)

## 2010-10-03 ENCOUNTER — Inpatient Hospital Stay (HOSPITAL_COMMUNITY): Payer: Medicare Other

## 2010-10-03 LAB — CBC
HCT: 27.5 % — ABNORMAL LOW (ref 39.0–52.0)
Hemoglobin: 9.3 g/dL — ABNORMAL LOW (ref 13.0–17.0)
MCH: 29.9 pg (ref 26.0–34.0)
MCHC: 33.8 g/dL (ref 30.0–36.0)
MCV: 88.4 fL (ref 78.0–100.0)
Platelets: 314 10*3/uL (ref 150–400)
RBC: 3.11 MIL/uL — ABNORMAL LOW (ref 4.22–5.81)
RDW: 13.3 % (ref 11.5–15.5)
WBC: 6.4 10*3/uL (ref 4.0–10.5)

## 2010-10-03 LAB — GLUCOSE, CAPILLARY
Glucose-Capillary: 115 mg/dL — ABNORMAL HIGH (ref 70–99)
Glucose-Capillary: 127 mg/dL — ABNORMAL HIGH (ref 70–99)
Glucose-Capillary: 127 mg/dL — ABNORMAL HIGH (ref 70–99)
Glucose-Capillary: 154 mg/dL — ABNORMAL HIGH (ref 70–99)
Glucose-Capillary: 128 mg/dL — ABNORMAL HIGH (ref 70–99)

## 2010-10-03 LAB — BASIC METABOLIC PANEL
BUN: 21 mg/dL (ref 6–23)
CO2: 23 mEq/L (ref 19–32)
Calcium: 9.2 mg/dL (ref 8.4–10.5)
Chloride: 104 mEq/L (ref 96–112)
Creatinine, Ser: 0.87 mg/dL (ref 0.50–1.35)
GFR calc Af Amer: >60 mL/min (ref >60)
GFR calc non Af Amer: >60 mL/min (ref >60)
Glucose, Bld: 123 mg/dL — ABNORMAL HIGH (ref 70–99)
Potassium: 3.8 mEq/L (ref 3.5–5.1)
Sodium: 137 mEq/L (ref 135–145)

## 2010-10-04 LAB — CBC
HCT: 28.2 % — ABNORMAL LOW (ref 39.0–52.0)
Hemoglobin: 10.2 g/dL — ABNORMAL LOW (ref 13.0–17.0)
MCH: 31.5 pg (ref 26.0–34.0)
MCHC: 36.2 g/dL — ABNORMAL HIGH (ref 30.0–36.0)
MCV: 87 fL (ref 78.0–100.0)
Platelets: 303 10*3/uL (ref 150–400)
RBC: 3.24 MIL/uL — ABNORMAL LOW (ref 4.22–5.81)
RDW: 13 % (ref 11.5–15.5)
WBC: 6.9 10*3/uL (ref 4.0–10.5)

## 2010-10-04 LAB — GLUCOSE, CAPILLARY: Glucose-Capillary: 110 mg/dL — ABNORMAL HIGH (ref 70–99)

## 2010-10-04 LAB — BASIC METABOLIC PANEL
BUN: 18 mg/dL (ref 6–23)
CO2: 24 mEq/L (ref 19–32)
Calcium: 9.1 mg/dL (ref 8.4–10.5)
Chloride: 101 mEq/L (ref 96–112)
Creatinine, Ser: 0.75 mg/dL (ref 0.50–1.35)
GFR calc Af Amer: >60 mL/min (ref >60)
GFR calc non Af Amer: >60 mL/min (ref >60)
Glucose, Bld: 109 mg/dL — ABNORMAL HIGH (ref 70–99)
Potassium: 3.5 mEq/L (ref 3.5–5.1)
Sodium: 135 mEq/L (ref 135–145)

## 2010-10-04 NOTE — Consult Note (Signed)
[name], [name] NO.:  [id number]  MEDICAL RECORD NO.:  [id number]  LOCATION:  2301                         FACILITY:  [facility]  PHYSICIAN:  Thana Farr, MD    DATE OF BIRTH:  02-Jul-1926  DATE OF CONSULTATION:  09/29/2010 DATE OF DISCHARGE:  09/18/2010                                CONSULTATION   REASON FOR CONSULTATION:  Altered mental status.  CONSULTING PHYSICIAN:  [name] Decent. Cornelius Moras, MD  HISTORY OF PRESENT ILLNESS:  The patient is an 77 year old male with the past medical history of coronary artery disease, hypertension, hyperlipidemia, and aortic valve insufficiency who was experiencing worsening symptoms.  Therefore, he was admitted to [facility] on September 23, 2010, for planned aortic valve replacement.  Surgery was successful, however, postoperatively, the patient was confused and was experiencing altered mental status.  This has persisted now since September 23, 2010, and the patient is not improved.  The patient was tried on Haldol without any response and he was also tried on Ativan and had paradoxical reaction of agitation.  We will consult for further management.  The rest of the history was obtainable given the patient's altered mental status.  PAST MEDICAL HISTORY: 1. Hypertension. 2. Hyperlipidemia. 3. Coronary artery disease. 4. Aortic insufficiency, status post aortic valve replacement.  MEDICATIONS: 1. Clonidine. 2. Metoprolol. 3. Seroquel. 4. Lovenox. 5. Sliding scale insulin. 6. Pepcid p.r.n. 7. Haloperidol. 8. Ativan. 9. Morphine. 10.D5 half normal saline at 50 mL an hour.  ALLERGIES:  TOLTERODINE.  FAMILY HISTORY:  Hypertension, otherwise noncontributory.  SOCIAL HISTORY:  Smokes half pack of cigarettes a day.  Drinks alcohol several beers a day, however, has not drink for the past month.  No illicit drug use.  REVIEW OF HISTORY:  Negative except for HPI.  PHYSICAL EXAMINATION:  VITAL SIGNS:  Blood  pressure 164/83, pulse 60, respiratory rate 22, temperature 97.8. GENERAL:  The patient is agitated, confused, alert and oriented x0. NEURO:  Cranial nerves II through XII grossly intact. GAIT:  Not assessed. MOTOR:  A 5/5 strength in all extremities.  Reflexes 2+ in all extremities.  The patient was noncompliant with the rest of his neurological exam. PULMONARY:  Clear to auscultation. CARDIOVASCULAR:  Regular rate and rhythm with 2/6 systolic ejection murmur.  No bruits noted.  LABORATORY DATA:  Sodium 131, potassium 4, chloride 98, bicarb 25, BUN 15, creatinine 0.8, glucose 135.  White count 7.2, hemoglobin 9.5, hematocrit 26.9, platelet count 203.  IMAGING: 1. Head CT was negative for any acute intracranial abnormalities. 2. Chest x-ray was negative for any infiltrates or infection.  ASSESSMENT:  The patient is an 76 year old male with past medical history of coronary artery disease, aortic stenosis, status post aortic valve replacement with coronary artery bypass graft x1, has been altered postoperatively.  The patient's altered mental status at this point appears to be multifactorial, with contributing factors of recent surgery, alcohol withdrawal and underlying baseline dementia, which was likely undetected at the time of admission(premorbidly patient often awakened confused and disoriented and would take some time to orient again).    RECOMMENADTIONS: 1. May try Risperdal IM 12.5 mg for agitation if fails may repeat  an additional dose of 12.5 mg.   2. EEG.   3. Address hyponatremia.  Thank you very much for consult.          ______________________________ Thana Farr, MD     LR/MEDQ  D:  09/29/2010  T:  09/29/2010  Job:  [id number]  Electronically Signed by Thana Farr MD on 10/04/2010 09:28:52 AM

## 2010-10-05 LAB — BASIC METABOLIC PANEL
BUN: 15 mg/dL (ref 6–23)
CO2: 26 mEq/L (ref 19–32)
Calcium: 9.2 mg/dL (ref 8.4–10.5)
Chloride: 102 mEq/L (ref 96–112)
Creatinine, Ser: 0.82 mg/dL (ref 0.50–1.35)
GFR calc Af Amer: >60 mL/min (ref >60)
GFR calc non Af Amer: >60 mL/min (ref >60)
Glucose, Bld: 107 mg/dL — ABNORMAL HIGH (ref 70–99)
Potassium: 4 mEq/L (ref 3.5–5.1)
Sodium: 137 mEq/L (ref 135–145)

## 2010-10-05 LAB — CBC
HCT: 26.9 % — ABNORMAL LOW (ref 39.0–52.0)
Hemoglobin: 9.1 g/dL — ABNORMAL LOW (ref 13.0–17.0)
MCH: 29.6 pg (ref 26.0–34.0)
MCHC: 33.8 g/dL (ref 30.0–36.0)
MCV: 87.6 fL (ref 78.0–100.0)
Platelets: 318 10*3/uL (ref 150–400)
RBC: 3.07 MIL/uL — ABNORMAL LOW (ref 4.22–5.81)
RDW: 13.1 % (ref 11.5–15.5)
WBC: 6.9 10*3/uL (ref 4.0–10.5)

## 2010-10-05 LAB — GLUCOSE, CAPILLARY: Glucose-Capillary: 128 mg/dL — ABNORMAL HIGH (ref 70–99)

## 2010-10-06 ENCOUNTER — Inpatient Hospital Stay (HOSPITAL_COMMUNITY): Payer: Medicare Other

## 2010-10-09 LAB — GLUCOSE, CAPILLARY: Glucose-Capillary: 92 mg/dL (ref 70–99)

## 2010-10-12 NOTE — Discharge Summary (Signed)
[name], [name] NO.:  [id number]  MEDICAL RECORD NO.:  [id number]  LOCATION:  2017                         FACILITY:  [facility]  PHYSICIAN:  Salvatore Decent. [name], M.D. DATE OF BIRTH:  1926-06-24  DATE OF ADMISSION:  09/23/2010 DATE OF DISCHARGE:                              DISCHARGE SUMMARY   FINAL DIAGNOSES: 1. Severe aortic stenosis. 2. Coronary artery disease.  SECONDARY DIAGNOSES: 1. Adenocarcinoma of the prostate. 2. Allergic rhinitis. 3. B12 deficiency. 4. Benign prostatic hypertrophy. 5. Carotid artery disease. 6. History of cerebrovascular accident. 7. History of diverticulosis. 8. History of fatigue. 9. History of gastroesophageal reflux disease. 10.History of hemorrhoids. 11.History of hiatal hernia. 12.History of hyperlipidemia. 13.History of hypertension. 14.History of osteopenia. 15.History of peptic ulcer disease. 16.History of peripheral vascular disease. 17.History of alcoholism. 18.History of shingles. 19.Status post popliteal synovial cystic lesions. 20.Status post right carotid endarterectomy. 21.Status post prostate surgery. 22.History of coronary artery disease status post cardiac stents.  IN-HOSPITAL DIAGNOSES: 1. Postoperative delirium/encephalopathy. 2. Volume overload. 3. Acute blood loss anemia. 4. Decondition. 5. Postoperative atrial fibrillation. 6. Postoperative minimal oropharyngeal dysphagia.  IN-HOSPITAL OPERATIONS AND PROCEDURES:  Aortic valve replacement with 23- mm [facility] Ease pericardial tissue valve, coronary artery bypass grafting x1 using the left internal mammary artery to distal left anterior descending coronary artery.  HISTORY AND PHYSICAL AND HOSPITAL COURSE:  The patient is an 77 year old male who has been followed by Dr. Antoine [name] with known history of aortic stenosis.  The patient had developed worsening symptoms with exertional shortness of breath and fatigue over the past 2 years.   Followup echocardiogram demonstrated progression of aortic stenosis, now with severe aortic stenosis with peak and mean transvalvular gradients estimated at 80 and 56 mmHg respectively.  The peak velocities crosses almost 469 cm/sec.  The calculated aortic valve area was estimated at 0.58 cm2.  There is normal left ventricular systolic function with ejection fraction estimated at 55-60%.  Left and right heart catheterization performed demonstrates moderate coronary artery disease with tubular 50-60% stenosis of the left anterior descending artery. There is moderate pulmonary hypertension.  Following these studies, Dr. Cornelius [name] was consulted.  Dr. Cornelius [name] saw and evaluated the patient.  He discussed with the patient undergoing coronary artery bypass grafting with aortic valve replacement.  He discussed the risks and benefits with the patient.  The patient acknowledged to understanding agreed to proceed.  Surgery was scheduled for September 23, 2010.  For further details of the patient's past medical history and physical exam, please see dictated H and P.  The patient was taken to the operating room on September 23, 2010 where he underwent coronary artery bypass grafting x1 using a left internal mammary artery to the left anterior descending coronary artery, aortic valve replacement using a 23-mm [employer] Ease pericardial tissue valve.  This was done by Dr. Cornelius [name] on September 23, 2010.  The patient tolerated this procedure well and was transferred to the Surgical Intensive Care Unit.  The patient was noted to be hemodynamically stable postoperatively.  The patient was able to be extubated on the evening of surgery.  Postextubation, the patient was responsive but agitated.  He was started  on IV Ativan and morphine.  It was felt that the patient was developing postoperative delirium with preoperative heavy alcohol use. The patient continued to remain agitated and confused.  Due to his persistent  delirium postoperatively, he was continued on medications. Ultimately, a CT scan of the head was obtained on September 28, 2010 which showed no intracranial hemorrhage with no CT evidence of large acute infarct.  Neuro was consulted on September 30, 2010 and felt that the patient's altered mental status was multifactorial with contributing factors of recent surgery, alcohol withdrawal, and underlying baseline dementia.  They recommended taking Risperdal and checking an EEG.  EEG was obtained showing an abnormal EEG secondary to posterior background slowing.  Findings consistent with diffuse disturbance that is etiologically nonspecific but may include metabolic encephalopathy.  The patient's mental status started to improve slowly.  By October 02, 2010, it was felt that the patient was making significant progress in his mental status.  Prior to discharge home, the patient's mental status was back near baseline.  During this time, the patient's vital signs were followed closely.  He was initially noted to be in normal sinus rhythm postoperatively.  Blood pressure was stable.  All drips were able to be weaned and discontinued.  He did have a brief run of atrial fibrillation and he was started on IV amiodarone.  The patient converted to normal sinus rhythm.  He was continued on low-dose beta-blocker.  He remained in normal sinus rhythm and amiodarone was ultimately discontinued.  During this time, the patient's blood pressures were followed closely.  He was noted to be hypertensive postoperatively and he was continued on clonidine patch as well as the Lopressor and Diovan. Currently, the patient is in normal sinus rhythm and blood pressure is better controlled.  Postoperatively, chest x-ray obtained on day #1 noted to be stable with small left pleural effusion and atelectasis. The patient had minimal drainage from chest tubes and chest tubes were discontinued in routine fashion.  Followup  chest x-rays remained stable. He was mentally stable.  He was using his incentive spirometer.  He was eventually able to be weaned off oxygen with O2 saturations maintaining greater than 90% on room air.  Most recent chest x-ray on October 06, 2010 showed resolution of right pleural effusion with trace left pleural effusion noted with minimal atelectasis.  During the patient's postoperative course, he required starting on TNA secondary to delirium and ultimately Panda tube placed and tube feedings were started.  Once delirium resolved, Speech Pathology performed a bedside swallow and noted minimal oropharyngeal dysphagia and recommended heart-healthy diet with thin liquids.  The patient was tolerating this well.  Also recommended 1 can of Ensure 4 times daily.  Postoperatively, the patient did have some mild acute blood loss anemia.  Hemoglobin/hematocrit were followed closely and remained stable.  He did not require any blood transfusions postoperatively.  The patient also had mild volume overload postoperatively and was started on diuretics.  Daily weights were followed closely.  This was improving prior to discharge. Postoperatively, the patient was noted to be deconditioned.  Physical Therapy and Occupational Therapy were consulted.  They are working well with the patient.  The patient is progressing well.  They felt that the patient may benefit from placement in skilled nursing facility for a short-term period.  Social worker was consulted and currently making arrangements for placement at skilled nursing facility.  Wife is deciding whether to proceed with skilled nursing facility versus taking the patient home  with Home Health.  Currently awaiting decision.  The patient had a prolonged surgical intensive care unit stay secondary to delirium postoperatively.  He was ultimately transferred out of the SICU to 2000 on October 07, 2010.  The patient is ultimately progressing well.   He is tolerating diet well.  His incisions were all clean, dry, and intact and healing well.  On October 08, 2010, the patient was noted to be afebrile and in normal sinus rhythm.  Blood pressure was stable.  O2 sats greater than 90% on room air.  Most recent lab work shows sodium of 137, potassium 4.0, chloride of 102, bicarbonate 26, BUN of 15, creatinine of 0.82, and glucose 107.  White blood cell count 6.9, hemoglobin 9.1, hematocrit 26.9, and platelet count 318.  The patient is tentatively ready for discharge to a skilled nursing facility versus home in the next 24 hours pending decision made.  FOLLOWUP APPOINTMENTS:  The patient has a followup appointment to see Dr. Cornelius [name] on November 02, 2010 at 1:30 p.m.  The patient need to obtain PMI chest x-ray 1 hour prior to this appointment.  The patient needs to follow up with Dr. Antoine [name] in 2 weeks.  He will need to contact this office to make these arrangements.  ACTIVITY:  The patient was instructed of no driving until released to do so, no lifting over 10 pounds.  He is told to ambulate, also he is to continue working with physical therapy and occupational therapy daily. He is to continue using his incentive spirometer.  INCISIONAL CARE:  It is okay for the patient to start showering and washing his incisions using soap and water.  Please contact us if he develops any drainage or opening from any of his incision sites.  DIET:  The patient educated on diet to be low-fat, low-salt.  DISCHARGE MEDICATIONS: 1. Enteric-coated aspirin 325 mg daily. 2. To have one can of beer 12 ounces daily with supper. 3. Clonidine 0.3 mg/24 hours transdermally, change weekly. 4. One can of Ensure 4 times daily. 5. Multivitamin daily. 6. Seroquel 100 mg daily at night. 7. Thiamine 100 mg daily. 8. Alendronate 70 mg on Mondays. 9. Atorvastatin 20 mg daily. 10.Diovan 80 mg daily. 11.Metoprolol XL 50 mg daily. 12.Omeprazole 40 mg  daily.     Sol Blazing, PA   ______________________________ Salvatore Decent. [name], M.D.    KMD/MEDQ  D:  10/08/2010  T:  10/08/2010  Job:  [id number]  cc:   Rollene Rotunda, MD, [facility]  Electronically Signed by Cameron Proud PA on 10/09/2010 01:18:54 PM Electronically Signed by Tressie Stalker M.D. on 10/12/2010 08:19:30 AM

## 2010-10-14 ENCOUNTER — Telehealth: Payer: Self-pay

## 2010-10-14 NOTE — Telephone Encounter (Signed)
HHRN called stating pt has been experiencing urinary frequency with odor. RN is requesting verbal order form MD to collect urine sample so results can be available for pt's appt tomorrow. Per Dr [name] urinalysis okay, HHRN advised and stat order will be placed and results faxed to office.

## 2010-10-15 ENCOUNTER — Encounter: Payer: Self-pay | Admitting: Internal Medicine

## 2010-10-15 ENCOUNTER — Ambulatory Visit (INDEPENDENT_AMBULATORY_CARE_PROVIDER_SITE_OTHER): Payer: Medicare Other | Admitting: Internal Medicine

## 2010-10-15 VITALS — BP 98/58 | HR 52 | Temp 97.5°F | Ht 70.0 in | Wt 169.0 lb

## 2010-10-15 DIAGNOSIS — N476 Balanoposthitis: Secondary | ICD-10-CM

## 2010-10-15 DIAGNOSIS — R630 Anorexia: Secondary | ICD-10-CM | POA: Insufficient documentation

## 2010-10-15 DIAGNOSIS — R1310 Dysphagia, unspecified: Secondary | ICD-10-CM | POA: Insufficient documentation

## 2010-10-15 DIAGNOSIS — D649 Anemia, unspecified: Secondary | ICD-10-CM | POA: Insufficient documentation

## 2010-10-15 DIAGNOSIS — F329 Major depressive disorder, single episode, unspecified: Secondary | ICD-10-CM | POA: Insufficient documentation

## 2010-10-15 DIAGNOSIS — R634 Abnormal weight loss: Secondary | ICD-10-CM

## 2010-10-15 DIAGNOSIS — N481 Balanitis: Secondary | ICD-10-CM

## 2010-10-15 DIAGNOSIS — Z23 Encounter for immunization: Secondary | ICD-10-CM

## 2010-10-15 MED ORDER — CITALOPRAM HYDROBROMIDE 10 MG PO TABS: 10.0000 mg | ORAL_TABLET | Freq: Every day | ORAL | Status: DC

## 2010-10-15 MED ORDER — CLOTRIMAZOLE-BETAMETHASONE 1-0.05 % EX CREA: TOPICAL_CREAM | CUTANEOUS | Status: DC

## 2010-10-15 MED ORDER — MEGESTROL ACETATE 40 MG PO TABS: 40.0000 mg | ORAL_TABLET | Freq: Every day | ORAL | Status: AC

## 2010-10-15 NOTE — Assessment & Plan Note (Signed)
Mild to mod, for antibx course,  to f/u any worsening symptoms or concerns - for lotrisone

## 2010-10-15 NOTE — Progress Notes (Signed)
Subjective:    Patient ID: [name], male    DOB: 24-Jul-1926, 77 y.o.   MRN: [id number]  HPI  Here to f/u recent hospn; last hgb just over 9,  Pt denies chest pain, increased sob or doe, wheezing, orthopnea, PND, increased LE swelling, palpitations, dizziness or syncope.  Pt denies new neurological symptoms such as new headache, or facial or extremity weakness or numbness   Pt denies polydipsia, polyuria.  Has effectively been quite fatigued, low appetitie, FTT .  Seen per home health nurse who sends a note to suggest megace. Pt could not tolerate ensure while hospd - too sweet, did not try glucerna. Wife estimates wt loss at 20 lbs since sept 5.   Has had some difficutly eating with some diffictuly with manipulation of food in the mouth seeming to be the main issue; has had some denture issues and plans to get this looked at.  Has had worsening depressive symptoms, but no suicidal ideation, or panic, though has ongoing anxiety.  Also with tender, red, sore area near glans penis that is new since post d/c. Past Medical History  Diagnosis Date  . ADENOCARCINOMA, PROSTATE 08/06/2008  . ALLERGIC RHINITIS 06/02/2007  . Aortic valve disorders 07/01/2008  . B12 DEFICIENCY 06/05/2008  . BENIGN PROSTATIC HYPERTROPHY 06/02/2007  . CAROTID ARTERY DISEASE 04/29/2009  . CEREBROVASCULAR ACCIDENT, HX OF 06/02/2007  . CHEST PAIN 06/04/2008  . CORONARY ARTERY DISEASE 11/09/2006  . DIVERTICULOSIS, COLON 06/02/2007  . FATIGUE 06/02/2007  . GERD 06/02/2007  . HEMORRHOIDS 07/31/2008  . HIATAL HERNIA 07/31/2008  . HYPERLIPIDEMIA 11/09/2006  . HYPERTENSION 11/09/2006  . OSTEOPENIA 06/02/2007  . PEPTIC ULCER DISEASE 06/02/2007  . PERIPHERAL VASCULAR DISEASE 11/09/2006  . Personal history of alcoholism 07/31/2008  . PROSTATE CANCER, HX OF 11/09/2006  . PUD, HX OF 07/31/2008  . TRANSIENT ISCHEMIC ATTACK 04/16/2009  . Shingles   . Anemia 10/15/2010   Past Surgical History  Procedure Date  . Popliteal synovial cyst  excision   . Right carotid endarterectomy 1999    Left carotid total chronic occlusion  . Prostate surgery   . Cardic stent 2004    reports that he has quit smoking. He does not have any smokeless tobacco history on file. He reports that he drinks about 21 ounces of alcohol per week. He reports that he does not use illicit drugs. family history includes Colon cancer (age of onset:62) in his mother; Coronary artery disease in his sister; Diabetes in his sister; Hypertension in his sister; Multiple sclerosis in his sister; and Stroke in his brother. Allergies  Allergen Reactions  . Tolterodine Tartrate    Current Outpatient Prescriptions on File Prior to Visit  Medication Sig Dispense Refill  . alendronate (FOSAMAX) 70 MG tablet Take 70 mg by mouth every 7 (seven) days. Take with a full glass of water on an empty stomach.       Marland Kitchen atorvastatin (LIPITOR) 20 MG tablet Take 20 mg by mouth daily.        . clopidogrel (PLAVIX) 75 MG tablet Take 75 mg by mouth daily. plavix stopped august 24      . cyanocobalamin (,VITAMIN B-12,) 1000 MCG/ML injection Inject 1,000 mcg into the muscle every 30 (thirty) days.        . fexofenadine (ALLEGRA) 180 MG tablet Take 180 mg by mouth daily.        . metoprolol (TOPROL-XL) 50 MG 24 hr tablet Take 50 mg by mouth daily.        Marland Kitchen  omeprazole (PRILOSEC) 40 MG capsule Take 40 mg by mouth daily.        . valsartan (DIOVAN) 80 MG tablet Take 80 mg by mouth daily.         Current Facility-Administered Medications on File Prior to Visit  Medication Dose Route Frequency Provider Last Rate Last Dose  . pneumococcal 23 valent vaccine (PNU-IMMUNE) injection 0.5 mL  0.5 mL Intramuscular Once Oliver Barre, MD       Review of Systems Review of Systems  Constitutional: Negative for diaphoresis and unexpected weight change.  HENT: Negative for drooling and tinnitus.   Eyes: Negative for photophobia and visual disturbance.  Respiratory: Negative for choking and stridor.     Gastrointestinal: Negative for vomiting and blood in stool.  Genitourinary: Negative for hematuria and decreased urine volume.     Objective:   Physical Exam BP 98/58  Pulse 52  Temp(Src) 97.5 F (36.4 C) (Oral)  Ht 5\' 10"  (1.778 m)  Wt 169 lb (76.658 kg)  BMI 24.25 kg/m2  SpO2 99% Physical Exam  VS noted, pale appearing Constitutional: Pt appears well-developed and well-nourished.  HENT: Head: Normocephalic.  Right Ear: External ear normal.  Left Ear: External ear normal.  Eyes: Conjunctivae and EOM are normal. Pupils are equal, round, and reactive to light.  Neck: Normal range of motion. Neck supple.  Cardiovascular: Normal rate and regular rhythm.   Pulmonary/Chest: Effort normal and breath sounds normal.  Abd:  Soft, NT, non-distended, + BS Neurological: Pt is alert. No cranial nerve deficit.  Skin: Skin is warm. No erythema.  Psychiatric: Pt behavior is normal. Thought content normal. depressed affect, 1+ nervous Penis with < .5 cm area tender/red/swelling to area just before the glans penis at the right post aspect    Assessment & Plan:

## 2010-10-15 NOTE — Assessment & Plan Note (Signed)
For megace trial x 4 wks,  to f/u any worsening symptoms or concerns

## 2010-10-15 NOTE — Assessment & Plan Note (Signed)
For citalopram 10 qd,  to f/u any worsening symptoms or concerns, verified nonsuicidal

## 2010-10-15 NOTE — Assessment & Plan Note (Signed)
C/w aspect of FTT picture - for glucerna asd, may be able to tolerate better

## 2010-10-15 NOTE — Patient Instructions (Signed)
Take all new medications as prescribed (all generic) - the megace for the appetite, lotrisone for the rash, and citalopram for low mood Continue all other medications as before You should try to take in at least 2 glucerna cans per day between meals each day for the next month You may need to have your denture situation evaluated by your dentist You had the flu shot today Please return in 1 month, or sooner if needed

## 2010-10-15 NOTE — Assessment & Plan Note (Signed)
Pt and wife will look in to denture fitting better, decline MBS, or GI referral at this time

## 2010-10-29 ENCOUNTER — Other Ambulatory Visit: Payer: Self-pay | Admitting: Thoracic Surgery (Cardiothoracic Vascular Surgery)

## 2010-10-29 DIAGNOSIS — I2510 Atherosclerotic heart disease of native coronary artery without angina pectoris: Secondary | ICD-10-CM

## 2010-10-30 ENCOUNTER — Other Ambulatory Visit: Payer: Self-pay | Admitting: Thoracic Surgery (Cardiothoracic Vascular Surgery)

## 2010-10-30 DIAGNOSIS — I2510 Atherosclerotic heart disease of native coronary artery without angina pectoris: Secondary | ICD-10-CM

## 2010-10-30 DIAGNOSIS — I350 Nonrheumatic aortic (valve) stenosis: Secondary | ICD-10-CM | POA: Insufficient documentation

## 2010-11-02 ENCOUNTER — Ambulatory Visit (INDEPENDENT_AMBULATORY_CARE_PROVIDER_SITE_OTHER): Payer: Self-pay | Admitting: Thoracic Surgery (Cardiothoracic Vascular Surgery)

## 2010-11-02 ENCOUNTER — Encounter: Payer: Self-pay | Admitting: Thoracic Surgery (Cardiothoracic Vascular Surgery)

## 2010-11-02 ENCOUNTER — Ambulatory Visit
Admission: RE | Admit: 2010-11-02 | Discharge: 2010-11-02 | Disposition: A | Discharge status: Home / Self Care | Payer: Medicare Other | Source: Ambulatory Visit | Attending: Thoracic Surgery (Cardiothoracic Vascular Surgery) | Admitting: Thoracic Surgery (Cardiothoracic Vascular Surgery)

## 2010-11-02 VITALS — BP 116/64 | HR 54 | Resp 18 | Ht 71.0 in | Wt 170.0 lb

## 2010-11-02 DIAGNOSIS — I359 Nonrheumatic aortic valve disorder, unspecified: Secondary | ICD-10-CM

## 2010-11-02 DIAGNOSIS — Z951 Presence of aortocoronary bypass graft: Secondary | ICD-10-CM

## 2010-11-02 DIAGNOSIS — I2510 Atherosclerotic heart disease of native coronary artery without angina pectoris: Secondary | ICD-10-CM

## 2010-11-02 DIAGNOSIS — Z952 Presence of prosthetic heart valve: Secondary | ICD-10-CM

## 2010-11-02 DIAGNOSIS — Z954 Presence of other heart-valve replacement: Secondary | ICD-10-CM

## 2010-11-02 NOTE — Patient Instructions (Signed)
The patient has been reminded to continue to avoid any heavy lifting or strenuous use of arms or shoulders for at least a total of three months from the time of surgery. The patient has been strongly encouraged to enroll in the cardiac rehabilitation program. The patient has been reminded to call and schedule an appointment with his primary cardiologist for followup.

## 2010-11-02 NOTE — Progress Notes (Signed)
  HPI: Patient returns for routine postoperative follow-up having undergone aortic valve replacement and coronary artery bypass grafting x1 on September 23, 2010. The patient's early postoperative recovery while in the hospital was notable for severe postoperative delirium which ultimately resolved with conservative measures. Since hospital discharge the patient reports doing remarkably well. The patient has had essentially no pain at all and he has not taken any sort of pain relievers. He denies any shortness of breath. His activity level is quite good and he wanted to get back doing more strenuous physical activity. He reports to be sleeping well at night. He has no other complaints.   Current Outpatient Prescriptions  Medication Sig Dispense Refill  . alendronate (FOSAMAX) 70 MG tablet Take 70 mg by mouth every 7 (seven) days. Take with a full glass of water on an empty stomach.       Marland Kitchen aspirin 81 MG tablet Take 81 mg by mouth daily.        Marland Kitchen atorvastatin (LIPITOR) 20 MG tablet Take 20 mg by mouth daily.        . citalopram (CELEXA) 10 MG tablet Take 1 tablet (10 mg total) by mouth daily.  90 tablet  3  . cloNIDine (CATAPRES - DOSED IN MG/24 HR) 0.3 mg/24hr Place 1 patch onto the skin once a week.        . clopidogrel (PLAVIX) 75 MG tablet Take 75 mg by mouth daily. plavix stopped august 24      . clotrimazole-betamethasone (LOTRISONE) cream Use as directed twice per day as needed  45 g  1  . cyanocobalamin (,VITAMIN B-12,) 1000 MCG/ML injection Inject 1,000 mcg into the muscle every 30 (thirty) days.        . megestrol (MEGACE) 40 MG tablet Take 1 tablet (40 mg total) by mouth daily.  30 tablet  0  . metoprolol (TOPROL-XL) 50 MG 24 hr tablet Take 50 mg by mouth daily.        Marland Kitchen omeprazole (PRILOSEC) 40 MG capsule Take 40 mg by mouth daily.        . valsartan (DIOVAN) 80 MG tablet Take 80 mg by mouth daily.         Current Facility-Administered Medications  Medication Dose Route Frequency  Provider Last Rate Last Dose  . pneumococcal 23 valent vaccine (PNU-IMMUNE) injection 0.5 mL  0.5 mL Intramuscular Once Oliver Barre, MD        Physical Exam: On physical exam the patient looks very good. His median sternotomy incision is healing nicely and the sternum is stable on palpation. Breath sounds are clear to auscultation and symmetrical bilaterally. Cardiovascular exam is notable for regular rate and rhythm. The abdomen is soft and nontender. Extremities are warm and well-perfused. There is no lower extremity edema.  Diagnostic Tests: Chest x-ray performed today demonstrates clear lung fields bilaterally. All of the sternal wires appear intact. No other abnormalities are noted.  Impression: The patient is doing very well one month following aortic valve replacement and coronary artery bypass grafting.  Plan: I've encouraged patient to continue to gradually increase his physical activity but I have reminded him to refrain from any sort of heavy lifting or strenuous use of his arms or shoulders for at least another 2 months. I have reminded him to schedule a followup appointment with his primary cardiologist for long-term attention to his blood pressure medications and long-term medical followup. I've encouraged him to get and rolled in the cardiac rehabilitation program

## 2010-11-04 ENCOUNTER — Ambulatory Visit (INDEPENDENT_AMBULATORY_CARE_PROVIDER_SITE_OTHER): Payer: Medicare Other | Admitting: Cardiology

## 2010-11-04 ENCOUNTER — Encounter: Payer: Self-pay | Admitting: Cardiology

## 2010-11-04 VITALS — BP 108/54 | HR 64 | Ht 70.0 in | Wt 176.0 lb

## 2010-11-04 DIAGNOSIS — I2510 Atherosclerotic heart disease of native coronary artery without angina pectoris: Secondary | ICD-10-CM

## 2010-11-04 NOTE — Assessment & Plan Note (Signed)
I have told him that I want him to be in cardiac rehab and he agrees to do this.  We went over nutrition and I gave him specific suggestions for increased protein and healthy calories.

## 2010-11-04 NOTE — Assessment & Plan Note (Signed)
The patient has no new sypmtoms.  No further cardiovascular testing is indicated.  We will continue with aggressive risk reduction and meds as listed.  

## 2010-11-04 NOTE — Patient Instructions (Signed)
Please stop your Clonidine patch. Continue all other medications as listed  You are strongly urged to participate in Cardiac Rehab.  Follow up with Dr Antoine Poche in 2 months

## 2010-11-04 NOTE — Progress Notes (Signed)
HPI The patient presents for followup of after AVR.  He did relatively well with surgery. However, postoperatively did not been as active as I would like. He declined cardiac rehabilitation. He has not had any chest pressure, neck or arm discomfort. He has had no fevers or chills. He has lost weight as he didn't eat well in the hospital. He said no shortness of breath, PND or orthopnea. He has had no edema.  He has had fatigue.  Allergies  Allergen Reactions  . Tolterodine Tartrate     Current Outpatient Prescriptions  Medication Sig Dispense Refill  . alendronate (FOSAMAX) 70 MG tablet Take 70 mg by mouth every 7 (seven) days. Take with a full glass of water on an empty stomach.       Marland Kitchen aspirin 81 MG tablet Take 81 mg by mouth daily.        Marland Kitchen atorvastatin (LIPITOR) 20 MG tablet Take 20 mg by mouth daily.        . citalopram (CELEXA) 10 MG tablet Take 1 tablet (10 mg total) by mouth daily.  90 tablet  3  . cloNIDine (CATAPRES - DOSED IN MG/24 HR) 0.3 mg/24hr Place 1 patch onto the skin once a week.        . clopidogrel (PLAVIX) 75 MG tablet Take 75 mg by mouth daily. plavix stopped august 24      . clotrimazole-betamethasone (LOTRISONE) cream Use as directed twice per day as needed  45 g  1  . cyanocobalamin (,VITAMIN B-12,) 1000 MCG/ML injection Inject 1,000 mcg into the muscle every 30 (thirty) days.        . megestrol (MEGACE) 40 MG tablet Take 1 tablet (40 mg total) by mouth daily.  30 tablet  0  . metoprolol (TOPROL-XL) 50 MG 24 hr tablet Take 50 mg by mouth daily.        Marland Kitchen omeprazole (PRILOSEC) 40 MG capsule Take 40 mg by mouth daily.        . valsartan (DIOVAN) 80 MG tablet Take 80 mg by mouth daily.         Current Facility-Administered Medications  Medication Dose Route Frequency Provider Last Rate Last Dose  . pneumococcal 23 valent vaccine (PNU-IMMUNE) injection 0.5 mL  0.5 mL Intramuscular Once Oliver Barre, MD        Past Medical History  Diagnosis Date  . ADENOCARCINOMA,  PROSTATE 08/06/2008  . ALLERGIC RHINITIS 06/02/2007  . Aortic valve disorders 07/01/2008  . B12 DEFICIENCY 06/05/2008  . BENIGN PROSTATIC HYPERTROPHY 06/02/2007  . CAROTID ARTERY DISEASE 04/29/2009  . CEREBROVASCULAR ACCIDENT, HX OF 06/02/2007  . CHEST PAIN 06/04/2008  . CORONARY ARTERY DISEASE 11/09/2006  . DIVERTICULOSIS, COLON 06/02/2007  . FATIGUE 06/02/2007  . GERD 06/02/2007  . HEMORRHOIDS 07/31/2008  . HIATAL HERNIA 07/31/2008  . HYPERLIPIDEMIA 11/09/2006  . HYPERTENSION 11/09/2006  . OSTEOPENIA 06/02/2007  . PEPTIC ULCER DISEASE 06/02/2007  . PERIPHERAL VASCULAR DISEASE 11/09/2006  . Personal history of alcoholism 07/31/2008  . PROSTATE CANCER, HX OF 11/09/2006  . PUD, HX OF 07/31/2008  . TRANSIENT ISCHEMIC ATTACK 04/16/2009  . Shingles   . Anemia 10/15/2010  . AS (aortic stenosis)     Past Surgical History  Procedure Date  . Popliteal synovial cyst excision   . Right carotid endarterectomy 1999    Left carotid total chronic occlusion  . Prostate surgery   . Cardic stent 2004  . Aortic valve replacement 09/23/2010    #66mm [facility] Ease pericardial tissue valve  .  Coronary artery bypass graft 09/23/2010    CABG x1 using LIMA to LAD    ROS:  As stated in the HPI and negative for all other systems.  PHYSICAL EXAM BP 108/54  Pulse 64  Ht 5\' 10"  (1.778 m)  Wt 176 lb (79.833 kg)  BMI 25.25 kg/m2 GENERAL:  Well appearing HEENT:  Pupils equal round and reactive, fundi not visualized, oral mucosa unremarkable NECK:  No jugular venous distention, waveform within normal limits, carotid upstroke brisk and symmetric, soft right bruits, no thyromegaly, right CEA scar LYMPHATICS:  No cervical, inguinal adenopathy LUNGS:  Clear to auscultation bilaterally BACK:  No CVA tenderness CHEST:  Well healed sternotomy scar. HEART:  PMI not displaced or sustained,S1 and S2 within normal limits, no S3, no S4, no clicks, no rubs, early peaking apical systolic murmur only at the apex ABD:   Flat, positive bowel sounds normal in frequency in pitch, no bruits, no rebound, no guarding, no midline pulsatile mass, no hepatomegaly, no splenomegaly EXT:  2 plus pulses throughout, no edema, no cyanosis no clubbing SKIN:  No rashes no nodules NEURO:  Cranial nerves II through XII grossly intact, motor grossly intact throughout PSYCH:  Cognitively intact, oriented to person place and time  EKG:  Sinus bradycardia, rate 64, leftward axis, minimal voltage criteria for LVH, premature ectopic complexes in a bigeminal pattern  ASSESSMENT AND PLAN

## 2010-11-04 NOTE — Assessment & Plan Note (Signed)
His blood pressure is low and he is fatigued.  I will stop the clonidine patch.  They will keep a BP diary at home.

## 2010-11-06 ENCOUNTER — Telehealth: Payer: Self-pay | Admitting: *Deleted

## 2010-11-06 MED ORDER — CLOTRIMAZOLE-BETAMETHASONE 1-0.05 % EX CREA: TOPICAL_CREAM | CUTANEOUS | Status: DC

## 2010-11-06 NOTE — Telephone Encounter (Signed)
Saw md on 10/15/10 md sent cream into local pharmacy, but is want rx sent to his mail order service [facility]. Inform wife will send to [facility]...11/06/10@12 :14pm/LMB

## 2010-11-11 ENCOUNTER — Ambulatory Visit (INDEPENDENT_AMBULATORY_CARE_PROVIDER_SITE_OTHER): Payer: Medicare Other | Admitting: Internal Medicine

## 2010-11-11 ENCOUNTER — Encounter: Payer: Self-pay | Admitting: Internal Medicine

## 2010-11-11 VITALS — BP 130/62 | HR 64 | Temp 97.5°F | Ht 70.0 in | Wt 169.5 lb

## 2010-11-11 DIAGNOSIS — Z951 Presence of aortocoronary bypass graft: Secondary | ICD-10-CM

## 2010-11-11 DIAGNOSIS — R5381 Other malaise: Secondary | ICD-10-CM

## 2010-11-11 DIAGNOSIS — I10 Essential (primary) hypertension: Secondary | ICD-10-CM

## 2010-11-11 DIAGNOSIS — E538 Deficiency of other specified B group vitamins: Secondary | ICD-10-CM

## 2010-11-11 NOTE — Assessment & Plan Note (Signed)
Good improvement off the clonidine;  to f/u any worsening symptoms or concerns

## 2010-11-11 NOTE — Assessment & Plan Note (Signed)
Has been on IM replacement for many months; ok b12 IM holiday, plan re-check 6-12 mo

## 2010-11-11 NOTE — Assessment & Plan Note (Signed)
Pt now willing to start cardiac rehab - will refer as directed prior per cardiology

## 2010-11-11 NOTE — Patient Instructions (Addendum)
Continue all other medications as before, including the cream OK to stop the B12 shots for now You will be contacted regarding the referral for: cardiac rehab Please return in 6 mo with Lab testing done 3-5 days before

## 2010-11-15 ENCOUNTER — Encounter: Payer: Self-pay | Admitting: Internal Medicine

## 2010-11-15 NOTE — Assessment & Plan Note (Signed)
stable overall by hx and exam, most recent data reviewed with pt, and pt to continue medical treatment as before  BP Readings from Last 3 Encounters:  11/11/10 130/62  11/04/10 108/54  11/02/10 116/64

## 2010-11-15 NOTE — Progress Notes (Signed)
Subjective:    Patient ID: [name], male    DOB: 08/03/1926, 77 y.o.   MRN: [id number]  HPI  Here to f/u with wife, overall stamina and general weakness improved, now willing to do the cardiac rehab as per cardiology recommendation last visit.  Does have sense of ongoing fatigue, but denies signficant hypersomnolence.  Pt denies chest pain, increased sob or doe, wheezing, orthopnea, PND, increased LE swelling, palpitations, dizziness or syncope.  Pt denies new neurological symptoms such as new headache, or facial or extremity weakness or numbness   Pt denies polydipsia, polyuria.  Rash as per last visit much improved with the lotrisone as well, asks for refill as it tends to recur.  No other new complaints Past Medical History  Diagnosis Date  . ADENOCARCINOMA, PROSTATE 08/06/2008  . ALLERGIC RHINITIS 06/02/2007  . Aortic valve disorders 07/01/2008  . B12 DEFICIENCY 06/05/2008  . BENIGN PROSTATIC HYPERTROPHY 06/02/2007  . CAROTID ARTERY DISEASE 04/29/2009  . CEREBROVASCULAR ACCIDENT, HX OF 06/02/2007  . CHEST PAIN 06/04/2008  . CORONARY ARTERY DISEASE 11/09/2006  . DIVERTICULOSIS, COLON 06/02/2007  . FATIGUE 06/02/2007  . GERD 06/02/2007  . HEMORRHOIDS 07/31/2008  . HIATAL HERNIA 07/31/2008  . HYPERLIPIDEMIA 11/09/2006  . HYPERTENSION 11/09/2006  . OSTEOPENIA 06/02/2007  . PEPTIC ULCER DISEASE 06/02/2007  . PERIPHERAL VASCULAR DISEASE 11/09/2006  . Personal history of alcoholism 07/31/2008  . PROSTATE CANCER, HX OF 11/09/2006  . PUD, HX OF 07/31/2008  . TRANSIENT ISCHEMIC ATTACK 04/16/2009  . Shingles   . Anemia 10/15/2010  . AS (aortic stenosis)    Past Surgical History  Procedure Date  . Popliteal synovial cyst excision   . Right carotid endarterectomy 1999    Left carotid total chronic occlusion  . Prostate surgery   . Cardic stent 2004  . Aortic valve replacement 09/23/2010    #67mm [facility] Ease pericardial tissue valve  . Coronary artery bypass graft 09/23/2010    CABG x1  using LIMA to LAD    reports that he has quit smoking. He does not have any smokeless tobacco history on file. He reports that he drinks about 21 ounces of alcohol per week. He reports that he does not use illicit drugs. family history includes Colon cancer (age of onset:62) in his mother; Coronary artery disease in his sister; Diabetes in his sister; Hypertension in his sister; Multiple sclerosis in his sister; and Stroke in his brother. Allergies  Allergen Reactions  . Tolterodine Tartrate    Current Outpatient Prescriptions on File Prior to Visit  Medication Sig Dispense Refill  . alendronate (FOSAMAX) 70 MG tablet Take 70 mg by mouth every 7 (seven) days. Take with a full glass of water on an empty stomach.       Marland Kitchen aspirin 81 MG tablet Take 81 mg by mouth daily.        Marland Kitchen atorvastatin (LIPITOR) 20 MG tablet Take 20 mg by mouth daily.        . citalopram (CELEXA) 10 MG tablet Take 1 tablet (10 mg total) by mouth daily.  90 tablet  3  . clopidogrel (PLAVIX) 75 MG tablet Take 75 mg by mouth daily. plavix stopped august 24      . clotrimazole-betamethasone (LOTRISONE) cream Use as directed twice per day as needed  135 g  1  . megestrol (MEGACE) 40 MG tablet Take 1 tablet (40 mg total) by mouth daily.  30 tablet  0  . metoprolol (TOPROL-XL) 50 MG 24 hr tablet  Take 50 mg by mouth daily.        Marland Kitchen omeprazole (PRILOSEC) 40 MG capsule Take 40 mg by mouth daily.        . valsartan (DIOVAN) 80 MG tablet Take 80 mg by mouth daily.         Current Facility-Administered Medications on File Prior to Visit  Medication Dose Route Frequency Provider Last Rate Last Dose  . pneumococcal 23 valent vaccine (PNU-IMMUNE) injection 0.5 mL  0.5 mL Intramuscular Once Oliver Barre, MD       Review of Systems Review of Systems  Constitutional: Negative for diaphoresis and unexpected weight change.  HENT: Negative for drooling and tinnitus.   Eyes: Negative for photophobia and visual disturbance.  Respiratory:  Negative for choking and stridor.   Gastrointestinal: Negative for vomiting and blood in stool.  Genitourinary: Negative for hematuria and decreased urine volume.    Objective:   Physical Exam BP 130/62  Pulse 64  Temp(Src) 97.5 F (36.4 C) (Oral)  Ht 5\' 10"  (1.778 m)  Wt 169 lb 8 oz (76.885 kg)  BMI 24.32 kg/m2  SpO2 99% Physical Exam  VS noted, thin but stable wt, less slow to ascend exam table, appears more energy, brighter today Constitutional: Pt appears well-developed and well-nourished.  HENT: Head: Normocephalic.  Right Ear: External ear normal.  Left Ear: External ear normal.  Eyes: Conjunctivae and EOM are normal. Pupils are equal, round, and reactive to light.  Neck: Normal range of motion. Neck supple.  Cardiovascular: Normal rate and regular rhythm.   Pulmonary/Chest: Effort normal and breath sounds normal.  Abd:  Soft, NT, non-distended, + BS Neurological: Pt is alert. No cranial nerve deficit.  Skin: Skin is warm. No erythema.     Assessment & Plan:

## 2010-12-08 ENCOUNTER — Encounter: Payer: Self-pay | Admitting: Internal Medicine

## 2010-12-08 ENCOUNTER — Ambulatory Visit (INDEPENDENT_AMBULATORY_CARE_PROVIDER_SITE_OTHER): Payer: Medicare Other | Admitting: Internal Medicine

## 2010-12-08 VITALS — BP 132/58 | HR 69 | Temp 97.5°F

## 2010-12-08 DIAGNOSIS — J209 Acute bronchitis, unspecified: Secondary | ICD-10-CM

## 2010-12-08 MED ORDER — HYDROCODONE-HOMATROPINE 5-1.5 MG/5ML PO SYRP: 5.0000 mL | ORAL_SOLUTION | Freq: Four times a day (QID) | ORAL | Status: AC | PRN

## 2010-12-08 MED ORDER — LEVOFLOXACIN 250 MG PO TABS: 250.0000 mg | ORAL_TABLET | Freq: Every day | ORAL | Status: AC

## 2010-12-08 NOTE — Progress Notes (Signed)
  Subjective:    Patient ID: [name], male    DOB: [birth year], 77 y.o.   MRN: [id number]  HPI  complains of cough  Onset 2 weeks ago Initially dry - now productive yellow sputum No fever, no chest pain, no shortness of breath or LE edema Precipitated by sick contacts Symptoms improved with use of leftover hydromet syrup, but not resolved   Past Medical History  Diagnosis Date  . ADENOCARCINOMA, PROSTATE   . ALLERGIC RHINITIS   . B12 DEFICIENCY   . BENIGN PROSTATIC HYPERTROPHY   . CAROTID ARTERY DISEASE   . CEREBROVASCULAR ACCIDENT, HX OF   . CORONARY ARTERY DISEASE   . DIVERTICULOSIS, COLON   . GERD   . HEMORRHOIDS   . HIATAL HERNIA   . HYPERLIPIDEMIA   . HYPERTENSION   . OSTEOPENIA   . PEPTIC ULCER DISEASE   . PERIPHERAL VASCULAR DISEASE   . Personal history of alcoholism   . PROSTATE CANCER, HX OF   . PUD, HX OF   . TRANSIENT ISCHEMIC ATTACK   . Shingles   . Anemia   . AS (aortic stenosis)      Review of Systems Pulmonary: no hemoptysis or pleurisy Cardiovascular: No palpitations, no chest pain    Objective:   Physical Exam BP 132/58  Pulse 69  Temp(Src) 97.5 F (36.4 C) (Oral)  SpO2 99% Wt Readings from Last 3 Encounters:  11/11/10 169 lb 8 oz (76.885 kg)  11/04/10 176 lb (79.833 kg)  11/02/10 170 lb (77.111 kg)    GEN: NAD - wife at side Lungs: R side rhonchi - no wheeze or crackle - good air movement CV: RRR, no murmur, no edema   CXR 10/30/10> resolution of L base airspace disease and effusion     Assessment & Plan:  Bronchitis - Levaquin abx x 10d, refill hydromet Afeb and O2 sats ok

## 2010-12-08 NOTE — Patient Instructions (Signed)
It was good to see you today. Levaquin antibiotics once a day for the next 10 days and continue cough syrup Your prescription(s) have been submitted to your pharmacy. Please take as directed and contact our office if you believe you are having problem(s) with the medication(s). If symptoms unresolved after 2 weeks, call for reevaluation - call sooner if worse Okay to continue Tylenol as needed for aches if needed

## 2011-01-08 ENCOUNTER — Ambulatory Visit: Payer: Medicare Other | Admitting: Cardiology

## 2011-01-29 ENCOUNTER — Ambulatory Visit (INDEPENDENT_AMBULATORY_CARE_PROVIDER_SITE_OTHER): Payer: Medicare Other | Admitting: Cardiology

## 2011-01-29 ENCOUNTER — Encounter: Payer: Self-pay | Admitting: Cardiology

## 2011-01-29 ENCOUNTER — Encounter: Payer: Self-pay | Admitting: *Deleted

## 2011-01-29 DIAGNOSIS — I2510 Atherosclerotic heart disease of native coronary artery without angina pectoris: Secondary | ICD-10-CM

## 2011-01-29 DIAGNOSIS — Z79899 Other long term (current) drug therapy: Secondary | ICD-10-CM

## 2011-01-29 DIAGNOSIS — R410 Disorientation, unspecified: Secondary | ICD-10-CM | POA: Insufficient documentation

## 2011-01-29 DIAGNOSIS — Z951 Presence of aortocoronary bypass graft: Secondary | ICD-10-CM

## 2011-01-29 DIAGNOSIS — F29 Unspecified psychosis not due to a substance or known physiological condition: Secondary | ICD-10-CM

## 2011-01-29 MED ORDER — METOPROLOL SUCCINATE ER 25 MG PO TB24: 25.0000 mg | ORAL_TABLET | Freq: Every day | ORAL | Status: DC

## 2011-01-29 NOTE — Progress Notes (Signed)
. HPI The patient presents for followup of after AVR.  He did relatively well with surgery. However, he has been having some problems with increasing confusion. He got on his motorcycle and drove it around [place] and his wife thinks he was lost for several hours the other day. He admits to having some balance disturbances which are new. He's adamantly denying confusion but it's clear talking to him that he has some of this. He denies any chest pressure, neck or arm discomfort. He's had no new shortness of breath, PND or orthopnea. He's denying any palpitations, presyncope or syncope. He had a bronchitis in the fall it was treated. He's had no recent cough fevers or chills.  Allergies  Allergen Reactions  . Tolterodine Tartrate     Current Outpatient Prescriptions  Medication Sig Dispense Refill  . alendronate (FOSAMAX) 70 MG tablet Take 70 mg by mouth every 7 (seven) days. Take with a full glass of water on an empty stomach.       Marland Kitchen aspirin 81 MG tablet Take 81 mg by mouth daily.        Marland Kitchen atorvastatin (LIPITOR) 20 MG tablet Take 20 mg by mouth daily.        . clopidogrel (PLAVIX) 75 MG tablet Take 75 mg by mouth daily. plavix stopped august 24      . clotrimazole-betamethasone (LOTRISONE) cream Use as directed twice per day as needed  135 g  1  . cyanocobalamin 100 MCG tablet Take 100 mcg by mouth daily.      . metoprolol (TOPROL-XL) 50 MG 24 hr tablet Take 50 mg by mouth daily.        . Multiple Vitamin (MULTIVITAMIN) capsule Take 1 capsule by mouth daily.      Marland Kitchen omeprazole (PRILOSEC) 40 MG capsule Take 40 mg by mouth daily.        . valsartan (DIOVAN) 80 MG tablet Take 80 mg by mouth daily.         Current Facility-Administered Medications  Medication Dose Route Frequency Provider Last Rate Last Dose  . pneumococcal 23 valent vaccine (PNU-IMMUNE) injection 0.5 mL  0.5 mL Intramuscular Once Oliver Barre, MD        Past Medical History  Diagnosis Date  . ADENOCARCINOMA, PROSTATE   .  ALLERGIC RHINITIS   . B12 DEFICIENCY   . BENIGN PROSTATIC HYPERTROPHY   . CAROTID ARTERY DISEASE   . CEREBROVASCULAR ACCIDENT, HX OF   . CORONARY ARTERY DISEASE   . DIVERTICULOSIS, COLON   . GERD   . HEMORRHOIDS   . HIATAL HERNIA   . HYPERLIPIDEMIA   . HYPERTENSION   . OSTEOPENIA   . PEPTIC ULCER DISEASE   . PERIPHERAL VASCULAR DISEASE   . Personal history of alcoholism   . PROSTATE CANCER, HX OF   . PUD, HX OF   . TRANSIENT ISCHEMIC ATTACK   . Shingles   . Anemia   . AS (aortic stenosis)     Past Surgical History  Procedure Date  . Popliteal synovial cyst excision   . Right carotid endarterectomy 1999    Left carotid total chronic occlusion  . Prostate surgery   . Cardic stent 2004  . Aortic valve replacement 09/23/2010    #43mm [facility] Ease pericardial tissue valve  . Coronary artery bypass graft 09/23/2010    CABG x1 using LIMA to LAD    ROS:  As stated in the HPI and negative for all other systems.  PHYSICAL EXAM  BP 120/70  Pulse 49  Ht 5\' 9"  (1.753 m)  Wt 174 lb (78.926 kg)  BMI 25.70 kg/m2 GENERAL:  Well appearing HEENT:  Pupils equal round and reactive, fundi not visualized, oral mucosa unremarkable NECK:  No jugular venous distention, waveform within normal limits, carotid upstroke brisk and symmetric, soft right bruits, no thyromegaly, right CEA scar LYMPHATICS:  No cervical, inguinal adenopathy LUNGS:  Clear to auscultation bilaterally BACK:  No CVA tenderness CHEST:  Well healed sternotomy scar. HEART:  PMI not displaced or sustained,S1 and S2 within normal limits, no S3, no S4, no clicks, no rubs, early peaking apical systolic murmur only at the apex ABD:  Flat, positive bowel sounds normal in frequency in pitch, no bruits, no rebound, no guarding, no midline pulsatile mass, no hepatomegaly, no splenomegaly EXT:  2 plus pulses throughout, no edema, no cyanosis no clubbing SKIN:  No rashes no nodules NEURO:  Cranial nerves II through XII  grossly intact, motor grossly intact throughout PSYCH:  Cognitively intact, oriented to person place and time, slight confusion at times talking to him today.  EKG:  Sinus bradycardia, rate 49, axis within normal limits, intervals within normal limits, no acute ST-T wave changes, premature ectopic complex. 01/29/2011   ASSESSMENT AND PLAN

## 2011-01-29 NOTE — Assessment & Plan Note (Signed)
The blood pressure is at target. No change in medications is indicated. We will continue with therapeutic lifestyle changes (TLC).  

## 2011-01-29 NOTE — Assessment & Plan Note (Addendum)
He has no ongoing chest pain. No change in therapy is indicated.  I will reduce his metoprolol to 25 mg daily given his bradycardia.

## 2011-01-29 NOTE — Assessment & Plan Note (Signed)
I am most concerned about the confusion. This is clearly progressive since his surgery. I'm going to send him to a neurologist for further evaluation and check labs to include TSH. He is against this but I think he will comply. His wife is quite concerned. Of note she is healthcare power of attorney for an aging relatives. However, she needs this documentation to demonstrate that she cannot leave her husband for extended periods of time to personally attend to this relative.

## 2011-01-29 NOTE — Patient Instructions (Addendum)
Please have blood work today (TSH, CBC, BMP and Hepatic panel)  You have been referred to Dr Modesto Charon  Please decrease your Metoprolol to 25 mg a day. Continue all other medications as listed  Follow up in 6 months with Dr Antoine Poche.  You will receive a letter in the mail 2 months before you are due.  Please call us when you receive this letter to schedule your follow up appointment.

## 2011-01-29 NOTE — Assessment & Plan Note (Signed)
Seems to be stable from a valve replacement standpoint. He will continue on the meds as listed.

## 2011-01-30 LAB — HEPATIC FUNCTION PANEL
AST: 28 U/L (ref 0–37)
Alkaline Phosphatase: 96 U/L (ref 39–117)
Bilirubin, Direct: 0.1 mg/dL (ref 0.0–0.3)
Indirect Bilirubin: 0.5 mg/dL (ref 0.0–0.9)
Total Bilirubin: 0.6 mg/dL (ref 0.3–1.2)

## 2011-01-30 LAB — BASIC METABOLIC PANEL
Chloride: 100 mEq/L (ref 96–112)
Potassium: 5 mEq/L (ref 3.5–5.3)
Sodium: 138 mEq/L (ref 135–145)

## 2011-01-30 LAB — CBC WITH DIFFERENTIAL/PLATELET
Basophils Absolute: 0.1 10*3/uL (ref 0.0–0.1)
Basophils Relative: 1 % (ref 0–1)
Hemoglobin: 12.4 g/dL — ABNORMAL LOW (ref 13.0–17.0)
Lymphocytes Relative: 13 % (ref 12–46)
MCHC: 33 g/dL (ref 30.0–36.0)
Neutro Abs: 3.6 10*3/uL (ref 1.7–7.7)
Neutrophils Relative %: 65 % (ref 43–77)
RDW: 15.4 % (ref 11.5–15.5)
WBC: 5.6 10*3/uL (ref 4.0–10.5)

## 2011-01-30 LAB — TSH: TSH: 2.756 u[IU]/mL (ref 0.350–4.500)

## 2011-02-01 NOTE — Progress Notes (Signed)
Addended by: Judithe Modest D on: 02/01/2011 04:24 PM   Modules accepted: Orders

## 2011-02-08 ENCOUNTER — Ambulatory Visit: Payer: Medicare Other | Admitting: Thoracic Surgery (Cardiothoracic Vascular Surgery)

## 2011-02-10 ENCOUNTER — Ambulatory Visit: Payer: Medicare Other | Admitting: Thoracic Surgery (Cardiothoracic Vascular Surgery)

## 2011-02-12 ENCOUNTER — Ambulatory Visit (INDEPENDENT_AMBULATORY_CARE_PROVIDER_SITE_OTHER): Payer: Medicare Other | Admitting: Thoracic Surgery (Cardiothoracic Vascular Surgery)

## 2011-02-12 ENCOUNTER — Encounter: Payer: Self-pay | Admitting: Thoracic Surgery (Cardiothoracic Vascular Surgery)

## 2011-02-12 VITALS — BP 156/84 | HR 52 | Temp 97.1°F | Resp 14 | Ht 66.0 in | Wt 173.0 lb

## 2011-02-12 DIAGNOSIS — Z954 Presence of other heart-valve replacement: Secondary | ICD-10-CM

## 2011-02-12 DIAGNOSIS — Z952 Presence of prosthetic heart valve: Secondary | ICD-10-CM | POA: Insufficient documentation

## 2011-02-12 DIAGNOSIS — I359 Nonrheumatic aortic valve disorder, unspecified: Secondary | ICD-10-CM

## 2011-02-12 DIAGNOSIS — Z951 Presence of aortocoronary bypass graft: Secondary | ICD-10-CM

## 2011-02-12 DIAGNOSIS — I350 Nonrheumatic aortic (valve) stenosis: Secondary | ICD-10-CM

## 2011-02-12 NOTE — Progress Notes (Signed)
[address]            [place] [address]          [phone]     CARDIOTHORACIC SURGERY OFFICE NOTE  PCP is Oliver Barre, MD, MD Referring Provider is Rollene Rotunda, MD  Chief Complaint  Patient presents with  . Coronary Artery Disease    3 month ov f/u  . Aortic Stenosis    HPI:  Patient returns for followup status post aortic valve replacement and coronary artery bypass grafting x1 on 09/23/2010. He was last seen here in the office 11/02/2010. Since then he has done very well from a cardiovascular standpoint. He never completed the cardiac rehabilitation program. He is returned to essentially is normal physical activity. According to his wife he has had some at least intermittent problems with mild confusion. It should be noted that he continues to drink alcohol and a significant amount, and he clearly exhibited signs of acute delirium following his surgery last September. We discussed his current practice riding his motorcycle, and he stated quite proudly that he never drinks more than one beer when he is going to ride his motorcycle.  Past Medical History  Diagnosis Date  . ADENOCARCINOMA, PROSTATE   . ALLERGIC RHINITIS   . B12 DEFICIENCY   . BENIGN PROSTATIC HYPERTROPHY   . CAROTID ARTERY DISEASE   . CEREBROVASCULAR ACCIDENT, HX OF   . CORONARY ARTERY DISEASE   . DIVERTICULOSIS, COLON   . GERD   . HEMORRHOIDS   . HIATAL HERNIA   . HYPERLIPIDEMIA   . HYPERTENSION   . OSTEOPENIA   . PEPTIC ULCER DISEASE   . PERIPHERAL VASCULAR DISEASE   . Personal history of alcoholism   . PROSTATE CANCER, HX OF   . PUD, HX OF   . TRANSIENT ISCHEMIC ATTACK   . Shingles   . Anemia   . AS (aortic stenosis)     Past Surgical History  Procedure Date  . Popliteal synovial cyst excision   . Right carotid endarterectomy 1999    Left carotid total chronic occlusion  . Prostate surgery   . Cardic stent 2004  . Aortic valve replacement 09/23/2010      #26mm [facility] Ease pericardial tissue valve  . Coronary artery bypass graft 09/23/2010    CABG x1 using LIMA to LAD    Family History  Problem Relation Age of Onset  . Colon cancer Mother 68  . Diabetes Sister   . Hypertension Sister   . Coronary artery disease Sister   . Multiple sclerosis Sister   . Stroke Brother     Social History History  Substance Use Topics  . Smoking status: Former Games developer  . Smokeless tobacco: Not on file  . Alcohol Use: 21.0 oz/week    7 Glasses of wine, 28 Cans of beer per week    Current Outpatient Prescriptions  Medication Sig Dispense Refill  . alendronate (FOSAMAX) 70 MG tablet Take 70 mg by mouth every 7 (seven) days. Take with a full glass of water on an empty stomach.       Marland Kitchen aspirin 81 MG tablet Take 81 mg by mouth daily.        Marland Kitchen atorvastatin (LIPITOR) 20 MG tablet Take 20 mg by mouth daily.        . clopidogrel (PLAVIX) 75 MG tablet Take 75 mg by mouth  daily. plavix stopped august 24      . clotrimazole-betamethasone (LOTRISONE) cream Use as directed twice per day as needed  135 g  1  . cyanocobalamin 100 MCG tablet Take 100 mcg by mouth daily.      . metoprolol succinate (TOPROL-XL) 25 MG 24 hr tablet Take 1 tablet (25 mg total) by mouth daily.  30 tablet  6  . Multiple Vitamin (MULTIVITAMIN) capsule Take 1 capsule by mouth daily.      Marland Kitchen omeprazole (PRILOSEC) 40 MG capsule Take 40 mg by mouth daily.        . valsartan (DIOVAN) 80 MG tablet Take 80 mg by mouth daily.         Current Facility-Administered Medications  Medication Dose Route Frequency Provider Last Rate Last Dose  . pneumococcal 23 valent vaccine (PNU-IMMUNE) injection 0.5 mL  0.5 mL Intramuscular Once Oliver Barre, MD        Allergies  Allergen Reactions  . Tolterodine Tartrate     Review of Systems:  General:  normal appetite, normal energy   Respiratory:  no cough, no wheezing, no hemoptysis, no pain with inspiration or cough, no shortness of breath    Cardiac:   no chest pain or tightness, no exertional SOB, no resting SOB, no PND, no orthopnea, no LE edema, no palpitations, no syncope  GI:   no difficulty swallowing, no hematochezia, no hematemesis, no melena, no constipation, no diarrhea   GU:   no dysuria, no urgency, no frequency, + gross hematuria to see Dr Patsi Sears today  Musculoskeletal: no arthritis, no arthralgia   Vascular:  no pain suggestive of claudication   Neuro:   no symptoms suggestive of TIA's, no seizures, no headaches, no peripheral neuropathy   Endocrine:  Negative   HEENT:  no loose teeth or painful teeth,  no recent vision changes  Psych:   no anxiety, no depression    Physical Exam:   BP 156/84  Pulse 52  Temp 97.1 F (36.2 C)  Resp 14  Ht 5\' 6"  (1.676 m)  Wt 173 lb (78.472 kg)  BMI 27.92 kg/m2  SpO2 99%  General:    well-appearing  HEENT:  Unremarkable   Neck:   no JVD, no bruits, no adenopathy   Chest:   clear to auscultation, symmetrical breath sounds, no wheezes, no rhonchi, sternum well-healed  CV:   RRR, no murmur   Abdomen:  soft, non-tender, no masses   Extremities:  warm, well-perfused  Rectal/GU  Deferred  Neuro:   Grossly non-focal and symmetrical throughout  Skin:   Clean and dry, no rashes, no breakdown  Diagnostic Tests:  None   Impression:  Patient has done very well now more than 4 months status post aortic valve replacement and coronary artery bypass grafting. He has had some trouble with confusion and I suspect that he has underlying dementia which may be progressing. Furthermore, he continues to use alcohol regularly, likely in excess.  Plan:  I've cautioned the patient regarding the use of alcohol and am very concerned about him riding a motorcycle at all. From a cardiovascular standpoint he is doing fine. In the future he will call and return to see [place] as needed.    Salvatore Decent. Cornelius Moras, MD 02/12/2011 11:06 AM

## 2011-02-15 ENCOUNTER — Other Ambulatory Visit: Payer: Self-pay | Admitting: Urology

## 2011-02-15 ENCOUNTER — Ambulatory Visit (HOSPITAL_COMMUNITY): Payer: Medicare Other | Admitting: Anesthesiology

## 2011-02-15 ENCOUNTER — Encounter (HOSPITAL_COMMUNITY): Payer: Self-pay | Admitting: Anesthesiology

## 2011-02-15 ENCOUNTER — Encounter (HOSPITAL_COMMUNITY)
Admission: RE | Disposition: A | Discharge status: Home / Self Care | Payer: Self-pay | Source: Ambulatory Visit | Attending: Urology

## 2011-02-15 ENCOUNTER — Encounter (HOSPITAL_COMMUNITY): Payer: Self-pay | Admitting: *Deleted

## 2011-02-15 ENCOUNTER — Inpatient Hospital Stay (HOSPITAL_COMMUNITY)
Admission: RE | Admit: 2011-02-15 | Discharge: 2011-02-25 | DRG: 669 | Disposition: A | Discharge status: Home / Self Care | Payer: Medicare Other | Source: Ambulatory Visit | Attending: Internal Medicine | Admitting: Internal Medicine

## 2011-02-15 DIAGNOSIS — IMO0002 Reserved for concepts with insufficient information to code with codable children: Secondary | ICD-10-CM | POA: Diagnosis not present

## 2011-02-15 DIAGNOSIS — N179 Acute kidney failure, unspecified: Secondary | ICD-10-CM | POA: Diagnosis not present

## 2011-02-15 DIAGNOSIS — Y842 Radiological procedure and radiotherapy as the cause of abnormal reaction of the patient, or of later complication, without mention of misadventure at the time of the procedure: Secondary | ICD-10-CM | POA: Diagnosis present

## 2011-02-15 DIAGNOSIS — Z951 Presence of aortocoronary bypass graft: Secondary | ICD-10-CM

## 2011-02-15 DIAGNOSIS — C670 Malignant neoplasm of trigone of bladder: Principal | ICD-10-CM | POA: Diagnosis present

## 2011-02-15 DIAGNOSIS — Z8673 Personal history of transient ischemic attack (TIA), and cerebral infarction without residual deficits: Secondary | ICD-10-CM

## 2011-02-15 DIAGNOSIS — Z8711 Personal history of peptic ulcer disease: Secondary | ICD-10-CM

## 2011-02-15 DIAGNOSIS — N390 Urinary tract infection, site not specified: Secondary | ICD-10-CM | POA: Diagnosis present

## 2011-02-15 DIAGNOSIS — E860 Dehydration: Secondary | ICD-10-CM | POA: Diagnosis present

## 2011-02-15 DIAGNOSIS — K5900 Constipation, unspecified: Secondary | ICD-10-CM | POA: Diagnosis present

## 2011-02-15 DIAGNOSIS — F1020 Alcohol dependence, uncomplicated: Secondary | ICD-10-CM | POA: Diagnosis present

## 2011-02-15 DIAGNOSIS — D62 Acute posthemorrhagic anemia: Secondary | ICD-10-CM | POA: Diagnosis not present

## 2011-02-15 DIAGNOSIS — R0902 Hypoxemia: Secondary | ICD-10-CM | POA: Diagnosis not present

## 2011-02-15 DIAGNOSIS — Z8546 Personal history of malignant neoplasm of prostate: Secondary | ICD-10-CM

## 2011-02-15 DIAGNOSIS — R4182 Altered mental status, unspecified: Secondary | ICD-10-CM | POA: Diagnosis not present

## 2011-02-15 DIAGNOSIS — F10239 Alcohol dependence with withdrawal, unspecified: Secondary | ICD-10-CM | POA: Diagnosis not present

## 2011-02-15 DIAGNOSIS — I2510 Atherosclerotic heart disease of native coronary artery without angina pectoris: Secondary | ICD-10-CM | POA: Diagnosis present

## 2011-02-15 DIAGNOSIS — F29 Unspecified psychosis not due to a substance or known physiological condition: Secondary | ICD-10-CM | POA: Diagnosis not present

## 2011-02-15 DIAGNOSIS — E7800 Pure hypercholesterolemia, unspecified: Secondary | ICD-10-CM | POA: Diagnosis present

## 2011-02-15 DIAGNOSIS — I10 Essential (primary) hypertension: Secondary | ICD-10-CM | POA: Diagnosis present

## 2011-02-15 DIAGNOSIS — K219 Gastro-esophageal reflux disease without esophagitis: Secondary | ICD-10-CM | POA: Diagnosis present

## 2011-02-15 DIAGNOSIS — Z923 Personal history of irradiation: Secondary | ICD-10-CM

## 2011-02-15 DIAGNOSIS — F068 Other specified mental disorders due to known physiological condition: Secondary | ICD-10-CM | POA: Diagnosis present

## 2011-02-15 DIAGNOSIS — R32 Unspecified urinary incontinence: Secondary | ICD-10-CM | POA: Diagnosis present

## 2011-02-15 DIAGNOSIS — N3040 Irradiation cystitis without hematuria: Secondary | ICD-10-CM | POA: Diagnosis present

## 2011-02-15 DIAGNOSIS — Z87891 Personal history of nicotine dependence: Secondary | ICD-10-CM

## 2011-02-15 DIAGNOSIS — Z0000 Encounter for general adult medical examination without abnormal findings: Secondary | ICD-10-CM

## 2011-02-15 DIAGNOSIS — Z952 Presence of prosthetic heart valve: Secondary | ICD-10-CM

## 2011-02-15 DIAGNOSIS — N3289 Other specified disorders of bladder: Secondary | ICD-10-CM | POA: Diagnosis present

## 2011-02-15 DIAGNOSIS — R339 Retention of urine, unspecified: Secondary | ICD-10-CM | POA: Diagnosis present

## 2011-02-15 DIAGNOSIS — F10939 Alcohol use, unspecified with withdrawal, unspecified: Secondary | ICD-10-CM | POA: Diagnosis not present

## 2011-02-15 DIAGNOSIS — R319 Hematuria, unspecified: Secondary | ICD-10-CM | POA: Diagnosis present

## 2011-02-15 DIAGNOSIS — R310 Gross hematuria: Secondary | ICD-10-CM | POA: Diagnosis present

## 2011-02-15 DIAGNOSIS — I739 Peripheral vascular disease, unspecified: Secondary | ICD-10-CM | POA: Diagnosis present

## 2011-02-15 HISTORY — DX: Adverse effect of unspecified anesthetic, initial encounter: T41.45XA

## 2011-02-15 HISTORY — PX: CYSTOSCOPY WITH BIOPSY: SHX5122

## 2011-02-15 LAB — CBC
Hemoglobin: 10 g/dL — ABNORMAL LOW (ref 13.0–17.0)
MCH: 30.1 pg (ref 26.0–34.0)
MCHC: 33.2 g/dL (ref 30.0–36.0)
MCV: 90.7 fL (ref 78.0–100.0)

## 2011-02-15 LAB — SURGICAL PCR SCREEN: MRSA, PCR: NEGATIVE

## 2011-02-15 SURGERY — CYSTOSCOPY, WITH BIOPSY
Anesthesia: Monitor Anesthesia Care | Wound class: Clean Contaminated

## 2011-02-15 MED ORDER — DEXTROSE 5 % IV SOLN
120.0000 mg | INTRAVENOUS | Status: AC
  Administered 2011-02-15: 120 mg via INTRAVENOUS
  Filled 2011-02-15: qty 3

## 2011-02-15 MED ORDER — METOPROLOL SUCCINATE ER 25 MG PO TB24
25.0000 mg | ORAL_TABLET | Freq: Every day | ORAL | Status: DC
  Administered 2011-02-15 – 2011-02-22 (×8): 25 mg via ORAL
  Filled 2011-02-15 (×9): qty 1

## 2011-02-15 MED ORDER — KCL IN DEXTROSE-NACL 20-5-0.45 MEQ/L-%-% IV SOLN
INTRAVENOUS | Status: DC
  Administered 2011-02-15 – 2011-02-18 (×5): via INTRAVENOUS
  Filled 2011-02-15 (×10): qty 1000

## 2011-02-15 MED ORDER — EPHEDRINE SULFATE 50 MG/ML IJ SOLN
INTRAMUSCULAR | Status: DC | PRN
  Administered 2011-02-15: 10 mg via INTRAVENOUS

## 2011-02-15 MED ORDER — HYDROCODONE-ACETAMINOPHEN 5-325 MG PO TABS: 1.0000 | ORAL_TABLET | ORAL | Status: DC | PRN

## 2011-02-15 MED ORDER — PROPOFOL 10 MG/ML IV EMUL
INTRAVENOUS | Status: DC | PRN
  Administered 2011-02-15: 120 mg via INTRAVENOUS

## 2011-02-15 MED ORDER — FENTANYL CITRATE 0.05 MG/ML IJ SOLN
INTRAMUSCULAR | Status: DC | PRN
  Administered 2011-02-15: 50 ug via INTRAVENOUS

## 2011-02-15 MED ORDER — LACTATED RINGERS IV SOLN
INTRAVENOUS | Status: DC | PRN
  Administered 2011-02-15: 14:00:00 via INTRAVENOUS

## 2011-02-15 MED ORDER — SODIUM CHLORIDE 0.9 % IR SOLN
3000.0000 mL | Status: DC
  Administered 2011-02-15 – 2011-02-18 (×6): 3000 mL

## 2011-02-15 MED ORDER — OLMESARTAN 10 MG HALF TABLET
10.0000 mg | ORAL_TABLET | Freq: Every day | ORAL | Status: DC
  Administered 2011-02-15 – 2011-02-16 (×2): 10 mg via ORAL
  Filled 2011-02-15 (×3): qty 1

## 2011-02-15 MED ORDER — MORPHINE SULFATE 2 MG/ML IJ SOLN
2.0000 mg | INTRAMUSCULAR | Status: DC | PRN
  Administered 2011-02-16 – 2011-02-17 (×2): 4 mg via INTRAVENOUS
  Filled 2011-02-15 (×2): qty 2

## 2011-02-15 MED ORDER — SODIUM CHLORIDE 0.9 % IV SOLN
1.0000 g | Freq: Four times a day (QID) | INTRAVENOUS | Status: AC
  Administered 2011-02-15 – 2011-02-16 (×4): 1 g via INTRAVENOUS
  Filled 2011-02-15 (×4): qty 1000

## 2011-02-15 MED ORDER — GENTAMICIN SULFATE 40 MG/ML IJ SOLN
200.0000 mg | INTRAVENOUS | Status: DC
  Administered 2011-02-15 – 2011-02-16 (×2): 200 mg via INTRAVENOUS
  Filled 2011-02-15 (×3): qty 5

## 2011-02-15 MED ORDER — STERILE WATER FOR IRRIGATION IR SOLN
Status: DC | PRN
  Administered 2011-02-15: 3000 mL

## 2011-02-15 MED ORDER — SIMVASTATIN 40 MG PO TABS
40.0000 mg | ORAL_TABLET | Freq: Every day | ORAL | Status: DC
  Administered 2011-02-15: 40 mg via ORAL
  Filled 2011-02-15 (×2): qty 1

## 2011-02-15 MED ORDER — ACETAMINOPHEN 325 MG PO TABS
650.0000 mg | ORAL_TABLET | ORAL | Status: DC | PRN
  Filled 2011-02-15: qty 2

## 2011-02-15 MED ORDER — ONDANSETRON HCL 4 MG/2ML IJ SOLN: 4.0000 mg | INTRAMUSCULAR | Status: DC | PRN

## 2011-02-15 MED ORDER — FENTANYL CITRATE 0.05 MG/ML IJ SOLN: 25.0000 ug | INTRAMUSCULAR | Status: DC | PRN

## 2011-02-15 MED ORDER — PANTOPRAZOLE SODIUM 40 MG PO TBEC
40.0000 mg | DELAYED_RELEASE_TABLET | Freq: Every day | ORAL | Status: DC
  Administered 2011-02-15 – 2011-02-25 (×10): 40 mg via ORAL
  Filled 2011-02-15 (×11): qty 1

## 2011-02-15 MED ORDER — PHENYLEPHRINE HCL 10 MG/ML IJ SOLN
10.0000 mg | INTRAVENOUS | Status: DC | PRN
  Administered 2011-02-15: 10 ug/min via INTRAVENOUS

## 2011-02-15 MED ORDER — LACTATED RINGERS IV SOLN: INTRAVENOUS | Status: DC

## 2011-02-15 MED ORDER — SODIUM CHLORIDE 0.9 % IR SOLN
Status: DC | PRN
  Administered 2011-02-15: 3000 mL

## 2011-02-15 MED ORDER — ALENDRONATE SODIUM 70 MG PO TABS
70.0000 mg | ORAL_TABLET | ORAL | Status: DC
Start: 2011-02-15 — End: 2011-02-15

## 2011-02-15 MED ORDER — ZOLPIDEM TARTRATE 5 MG PO TABS
5.0000 mg | ORAL_TABLET | Freq: Every evening | ORAL | Status: DC | PRN
  Administered 2011-02-19 – 2011-02-20 (×2): 5 mg via ORAL
  Filled 2011-02-15 (×2): qty 1

## 2011-02-15 MED ORDER — PNEUMOCOCCAL VAC POLYVALENT 25 MCG/0.5ML IJ INJ: 0.5000 mL | INJECTION | Freq: Once | INTRAMUSCULAR | Status: DC

## 2011-02-15 MED ORDER — HYOSCYAMINE SULFATE 0.125 MG SL SUBL
0.1250 mg | SUBLINGUAL_TABLET | SUBLINGUAL | Status: DC | PRN
  Filled 2011-02-15: qty 1

## 2011-02-15 MED ORDER — PHENYLEPHRINE HCL 10 MG/ML IJ SOLN
INTRAMUSCULAR | Status: DC | PRN
  Administered 2011-02-15: 60 ug via INTRAVENOUS

## 2011-02-15 MED ORDER — SODIUM CHLORIDE 0.9 % IV SOLN
2.0000 g | INTRAVENOUS | Status: AC
  Administered 2011-02-15: 2 g via INTRAVENOUS
  Filled 2011-02-15: qty 2000

## 2011-02-15 MED ORDER — DOCUSATE SODIUM 100 MG PO CAPS
100.0000 mg | ORAL_CAPSULE | Freq: Two times a day (BID) | ORAL | Status: DC
  Administered 2011-02-15 – 2011-02-25 (×16): 100 mg via ORAL
  Filled 2011-02-15 (×23): qty 1

## 2011-02-15 MED ORDER — GENTAMICIN IN SALINE 1.6-0.9 MG/ML-% IV SOLN
INTRAVENOUS | Status: AC
  Filled 2011-02-15: qty 50

## 2011-02-15 SURGICAL SUPPLY — 12 items
BAG URINE LEG 500ML (DRAIN) ×1 IMPLANT
BAG URO CATCHER STRL LF (DRAPE) ×2 IMPLANT
CATH FOLEY 3WAY 30CC 22FR (CATHETERS) ×1 IMPLANT
CLOTH BEACON ORANGE TIMEOUT ST (SAFETY) ×2 IMPLANT
DRAPE CAMERA CLOSED 9X96 (DRAPES) ×2 IMPLANT
GLOVE SURG SS PI 8.0 STRL IVOR (GLOVE) ×2 IMPLANT
GOWN PREVENTION PLUS XLARGE (GOWN DISPOSABLE) ×2 IMPLANT
GOWN STRL REIN XL XLG (GOWN DISPOSABLE) ×2 IMPLANT
MANIFOLD NEPTUNE II (INSTRUMENTS) ×2 IMPLANT
PACK CYSTO (CUSTOM PROCEDURE TRAY) ×1 IMPLANT
SYRINGE IRR TOOMEY STRL 70CC (SYRINGE) ×1 IMPLANT
TUBING CONNECTING 10 (TUBING) ×2 IMPLANT

## 2011-02-15 NOTE — Addendum Note (Signed)
Addendum  created 02/15/11 1547 by Sherald Barge, CRNA   Modules edited:Anesthesia LDA

## 2011-02-15 NOTE — Preoperative (Signed)
Beta Blockers   Reason not to administer Beta Blockers:Not Applicable 

## 2011-02-15 NOTE — H&P (Signed)
Active Problems Problems  1. Acute Urinary Retention 788.20 2. Gross Hematuria 599.71 3. 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 Prostate Cancer 185 5. Urinary Incontinence 788.30  History of Present Illness        [name] is a 78 yo [facility] patient of Dr. Patsi Sears with a history of prostate cancer treated in 2001 with prostatectomy.  He has a history of incontinence.  He had the onset a week ago of gross hematuria.  The bleeding has been intermittant with clots.  He began to have difficult voiding over the weekend and now is in retention.  He feels constipated as well.   He is on Plavix for an Aortic Valve Replacement.  He had adjuvant radiation 5 years post RP.  His PSA has been undectible with the last in 11/12.   Past Medical History Problems  1. History of  Arthritis V13.4 2. History of  Cath Stent Placement 3. History of  Esophageal Reflux 530.81 4. History of  Hypercholesterolemia 272.0 5. History of  Hypertension 401.9 6. History of  Murmurs 785.2 7. History of  Prostate Cancer V10.46 8. History of  Transient Ischemic Attack 435.9  Surgical History Problems  1. History of  Aortic Valve Replacement 2. History of  CABG (CABG) V45.81 3. History of  Prostate Surgery  Current Meds 1. Alendronate Sodium 70 MG Oral Tablet; Therapy: 06Jan2012 to 2. Aspirin 81 MG Oral Tablet; Therapy: (Recorded:28Nov2012) to 3. Diovan 80 MG CAPS; Therapy: (Recorded:06May2008) to 4. Lipitor 20 MG Oral Tablet; Therapy: (Recorded:06May2008) to 5. Metoprolol Tartrate 50 MG Oral Tablet; Therapy: (Recorded:06May2008) to 6. Multi-Vitamin TABS; Therapy: (Recorded:28Jan2013) to 7. Plavix 75 MG Oral Tablet; Therapy: 18Nov2011 to 8. PriLOSEC 20 MG Oral Capsule Delayed Release; Therapy: (Recorded:28Jan2013) to 9. Vitamin B-1 TABS; Therapy: (Recorded:28Jan2013) to  Allergies Medication  1. Detrol TABS  Family History Problems  1. Family history of  No Significant Family  History  Social History Problems  1. Marital History - Currently Married 2. Occupation: retired 3. Tobacco Use V15.82 SMOKED x 30YRS. QUIT 48 YRS AGO.  Review of Systems Genitourinary, constitutional, skin, eye, otolaryngeal, hematologic/lymphatic, cardiovascular, pulmonary, endocrine, musculoskeletal, gastrointestinal, neurological and psychiatric system(s) were reviewed and pertinent findings if present are noted.  Gastrointestinal: no nausea.  Constitutional: no fever.  Cardiovascular: no chest pain.  Respiratory: no shortness of breath.  Neurological: (Some confusion since surgery in September).    Vitals Vital Signs [Data Includes: Last 1 Day]  28Jan2013 08:28AM  Blood Pressure: 154 / 91 Temperature: 97.5 F Heart Rate: 50  Physical Exam Constitutional: Well nourished and well developed . No acute distress. But in pain.  Pulmonary: No respiratory distress and normal respiratory rhythm and effort.  Cardiovascular:. No peripheral edema. The rhythm was irregularly irregular. A grade 2/6 systolic murmur was heard.  Abdomen: No masses are palpated. Moderate suprapubic tenderness is present. No CVA tenderness. No hernias are palpable. No hepatosplenomegaly noted.  Genitourinary: Examination of the penis demonstrates no discharge, no masses, no lesions and a normal meatus. The scrotum is without lesions. The right epididymis is palpably normal and non-tender. The left epididymis is palpably normal and non-tender. The right testis is non-tender and without masses. The left testis is non-tender and without masses.    Procedure  Procedure: Cystoscopy  Indication: Hematuria.  Informed Consent: Risks, benefits, and potential adverse events were discussed and informed consent was obtained from the patient.  Prep: The patient was prepped with betadine.  Procedure Note:  Urethral meatus:.  No abnormalities.  Anterior urethra: No abnormalities.  Prostatic urethra:. Absent.  Bladder:  Visulization was obscured due to blood. The ureteral orifices were in the normal anatomic position bilaterally and had clear efflux of urine. Examination of the bladder demonstrated moderate trabeculation. A solitary tumor was visualized in the bladder. A papillary tumor was seen in the bladder. This tumor was located near the trigone of the bladder. (with active bleeding from the lesion that measures about 1x2cm) Prior to cystoscopy a 57fr foley was placed with return of a large volume of clots, but the urine wouldn't clear with irrigation so cysto was performed. At the end of the case an attempt was made to place a 37fr [place] but I ended up a 32fr Coude. The patient tolerated the procedure well.  Complications: None.    Assessment Assessed  1. Gross Hematuria 599.71 2. 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 Acute Urinary Retention 788.20   He has clot retention from active bleeding from a lesion at the trigone that may be a tumor.  He is on ASA and Plavix for his valve replacement.   Plan Gross Hematuria (599.71)  1. Cath & Irrigation  Done: 28Jan2013 2. Cysto  Done: 28Jan2013 3. Follow-up Schedule Surgery Office  Follow-up  Requested for: 28Jan2013 Prostate Cancer (185)  4. Myrbetriq 50 MG Oral Tablet Extended Release 24 Hour; Take 1 tablet daily; Therapy: 28Nov2012  to 28Jan2013; Last Rx:28Nov2012; Status: DISCONTINUED   I will set him up for a cystoscopy with biopsy and fulguration today.   The risks of bleeding, infection, bladder injury, thrombotic events, valve infections and anesthetic risks reviewed.

## 2011-02-15 NOTE — Progress Notes (Signed)

## 2011-02-15 NOTE — Progress Notes (Signed)
Urology Progress Note  Day of Surgery   Subjective: post op cysto  fulgeration of radiation cystitis.    No acute urologic events overnight. Ambulation: bedrest tonight   negative Flatus: yes   positive Bowel movement no negative  Pain: Minimal tonight  Objective:  Blood pressure 132/71, pulse 83, temperature 97.6 F (36.4 C), temperature source Oral, resp. rate 18, SpO2 100.00%.  Physical Exam:  General:  No acute distress, awake  Genitourinary:  Foley cath with CBI in place. Foley:yes: 3 way.        Recent Labs  [facility] 02/15/11 1220   HGB 10.0*   WBC 12.2*   PLT 225    No results found for this basename: NA:2,K:2,CL:2,CO2:2,BUN:2,CREATININE:2,CALCIUM:2,MAGNESIUM:2,GFRNONAA:2,GFRAA:2 in the last 72 hours   No results found for this basename: PT:2,INR:2,APTT:2 in the last 72 hours   No components found with this basename: ABG:2  Assessment/Plan: Stable tonight. Mild hematuria with CBI. Anticipate d/c CBI in AM.  Catheter not removed.

## 2011-02-15 NOTE — Addendum Note (Signed)
Addendum  created 02/15/11 1547 by Danyele Smejkal, CRNA   Modules edited:Anesthesia LDA    

## 2011-02-15 NOTE — Anesthesia Preprocedure Evaluation (Addendum)
Anesthesia Evaluation  Patient identified by MRN, date of birth, ID band Patient awake    Reviewed: Allergy & Precautions, H&P , NPO status , Patient's Chart, lab work & pertinent test results  Airway Mallampati: II TM Distance: >3 FB Neck ROM: Full    Dental No notable dental hx.    Pulmonary neg pulmonary ROS,  clear to auscultation  Pulmonary exam normal       Cardiovascular hypertension, + CAD + Valvular Problems/Murmurs AS Regular Normal    Neuro/Psych Depression CVA Negative Psych ROS   GI/Hepatic Neg liver ROS, PUD, GERD-  ,  Endo/Other  Negative Endocrine ROS  Renal/GU negative Renal ROS  Genitourinary negative   Musculoskeletal negative musculoskeletal ROS (+)   Abdominal   Peds negative pediatric ROS (+)  Hematology negative hematology ROS (+)   Anesthesia Other Findings   Reproductive/Obstetrics negative OB ROS                           Anesthesia Physical Anesthesia Plan  ASA: III  Anesthesia Plan: MAC   Post-op Pain Management:    Induction: Intravenous  Airway Management Planned: Simple Face Mask  Additional Equipment:   Intra-op Plan:   Post-operative Plan:   Informed Consent: I have reviewed the patients History and Physical, chart, labs and discussed the procedure including the risks, benefits and alternatives for the proposed anesthesia with the patient or authorized representative who has indicated his/her understanding and acceptance.   Dental advisory given  Plan Discussed with: CRNA  Anesthesia Plan Comments:         Anesthesia Quick Evaluation

## 2011-02-15 NOTE — Brief Op Note (Signed)
02/15/2011  2:40 PM  PATIENT:  [name]  78 y.o. male  PRE-OPERATIVE DIAGNOSIS:  clot retention  POST-OPERATIVE DIAGNOSIS:  clot retention with radiation cystitis.  PROCEDURE:  Procedure(s): CYSTOSCOPY WITH BIOPSY AND FULGURATION >2CM  SURGEON:  Surgeon(s): Anner Crete, MD  PHYSICIAN ASSISTANT:   ASSISTANTS: none   ANESTHESIA:   general  EBL:  Total I/O In: -  Out: 325 [Urine:325]  BLOOD ADMINISTERED:none  DRAINS: Urinary Catheter (Foley)   LOCAL MEDICATIONS USED:  NONE  SPECIMEN:  No Specimen and Biopsy / Limited Resection  DISPOSITION OF SPECIMEN:  PATHOLOGY  COUNTS:  YES  TOURNIQUET:  * No tourniquets in log *  DICTATION: .Other Dictation: Dictation Number [id number]  PLAN OF CARE: Admit for overnight observation  PATIENT DISPOSITION:  PACU - hemodynamically stable.   Delay start of Pharmacological VTE agent (>24hrs) due to surgical blood loss or risk of bleeding:  {YES/NO/NOT APPLICABLE:20182

## 2011-02-15 NOTE — Progress Notes (Signed)
Dr. Okey Dupre made aware of patient's EKG readings

## 2011-02-15 NOTE — Progress Notes (Signed)
ANTIBIOTIC CONSULT NOTE - INITIAL  Pharmacy Consult for Gentamicin, renal dosing of antibiotics Indication: SBE prophylaxis  Allergies  Allergen Reactions  . Tolterodine Tartrate     Patient Measurements:   78.5kg and 168cm as of 02/12/11 IBW: 64.1kg, Adjusted Body Weight: 69.9kg  Vital Signs: Temp: 97.6 F (36.4 C) (01/28 1645) Temp src: Oral (01/28 1152) BP: 132/71 mmHg (01/28 1645) Pulse Rate: 83  (01/28 1645) Labs:  Basename 02/15/11 1220  WBC 12.2*  HGB 10.0*  PLT 225  LABCREA --  CREATININE --   Microbiology: Recent Results (from the past 720 hour(s))  SURGICAL PCR SCREEN     Status: Normal   Collection Time   02/15/11 12:18 PM      Component Value Range Status Comment   MRSA, PCR NEGATIVE  NEGATIVE  Final    Staphylococcus aureus NEGATIVE  NEGATIVE  Final     Medical History: Past Medical History  Diagnosis Date  . ADENOCARCINOMA, PROSTATE   . ALLERGIC RHINITIS   . B12 DEFICIENCY   . BENIGN PROSTATIC HYPERTROPHY   . CAROTID ARTERY DISEASE   . CEREBROVASCULAR ACCIDENT, HX OF   . CORONARY ARTERY DISEASE   . DIVERTICULOSIS, COLON   . GERD   . HEMORRHOIDS   . HIATAL HERNIA   . HYPERLIPIDEMIA   . HYPERTENSION   . OSTEOPENIA   . PEPTIC ULCER DISEASE   . PERIPHERAL VASCULAR DISEASE   . Personal history of alcoholism   . PROSTATE CANCER, HX OF   . PUD, HX OF   . TRANSIENT ISCHEMIC ATTACK   . Shingles   . Anemia   . AS (aortic stenosis)   . S/P AVR (aortic valve replacement) 09/23/2010  . Complication of anesthesia 09/23/2010    very confused after waking up. Stopped drinking 40 days before this surgery.    Medications:  Anti-infectives     Start     Dose/Rate Route Frequency Ordered Stop   02/15/11 1800   ampicillin (OMNIPEN) 1 g in sodium chloride 0.9 % 50 mL IVPB        1 g 150 mL/hr over 20 Minutes Intravenous Every 6 hours 02/15/11 1658 02/16/11 1759   02/15/11 1153   ampicillin (OMNIPEN) 2 g in sodium chloride 0.9 % 50 mL IVPB        2  g 150 mL/hr over 20 Minutes Intravenous 30 min pre-op 02/15/11 1153 02/15/11 1405   02/15/11 1153   gentamicin (GARAMYCIN) 120 mg in dextrose 5 % 50 mL IVPB        120 mg 106 mL/hr over 30 Minutes Intravenous 30 min pre-op 02/15/11 1153 02/15/11 1414         Assessment: 78 yo M admit for Cystoscopy, biopsy and fulguration of the bladder No history of evidence of infection. No recent cultures area available. Hx of aortic valve replacement and coronary artery bypass grafting x1 on 09/23/2010. Renal function is within normal limits with CrCl ~59 ml/min Prophylaxis gentamicin ordered and for synergy with ampicillin will dose as 3mg /kg of IBW Ampicillin 1g IV Q6h ordered  Plan:  Gentamicin 200mg  IV Q24 hrs Continue Ampicillin as ordered Follow up plan for continued abx dosing.  Lynann Beaver PharmD  Pager [phone] 02/15/2011 5:57 PM

## 2011-02-15 NOTE — Transfer of Care (Signed)
Immediate Anesthesia Transfer of Care Note  Patient: [name]  Procedure(s) Performed:  CYSTOSCOPY WITH BIOPSY - Cystoscopy, biopsy and fulguration of the bladder  Patient Location: PACU  Anesthesia Type: General  Level of Consciousness: awake, alert , oriented, patient cooperative and responds to stimulation  Airway & Oxygen Therapy: Patient Spontanous Breathing and Patient connected to face mask oxygen  Post-op Assessment: Report given to PACU RN, Post -op Vital signs reviewed and stable and Patient moving all extremities  Post vital signs: Reviewed and stable  Complications: No apparent anesthesia complications

## 2011-02-15 NOTE — Anesthesia Postprocedure Evaluation (Signed)
  Anesthesia Post-op Note  Patient: [name]  Procedure(s) Performed:  CYSTOSCOPY WITH BIOPSY - Cystoscopy, biopsy and fulguration of the bladder  Patient Location: PACU  Anesthesia Type: General  Level of Consciousness: awake and alert   Airway and Oxygen Therapy: Patient Spontanous Breathing  Post-op Pain: mild  Post-op Assessment: Post-op Vital signs reviewed, Patient's Cardiovascular Status Stable, Respiratory Function Stable, Patent Airway and No signs of Nausea or vomiting  Post-op Vital Signs: stable  Complications: No apparent anesthesia complications

## 2011-02-16 ENCOUNTER — Other Ambulatory Visit: Payer: Self-pay

## 2011-02-16 ENCOUNTER — Observation Stay (HOSPITAL_COMMUNITY): Payer: Medicare Other

## 2011-02-16 LAB — BASIC METABOLIC PANEL
CO2: 26 mEq/L (ref 19–32)
Chloride: 103 mEq/L (ref 96–112)
Glucose, Bld: 98 mg/dL (ref 70–99)
Sodium: 136 mEq/L (ref 135–145)

## 2011-02-16 LAB — CBC
Hemoglobin: 6.7 g/dL — CL (ref 13.0–17.0)
Platelets: 176 10*3/uL (ref 150–400)
RBC: 2.2 MIL/uL — ABNORMAL LOW (ref 4.22–5.81)
WBC: 7.5 10*3/uL (ref 4.0–10.5)

## 2011-02-16 LAB — HEMOGLOBIN AND HEMATOCRIT, BLOOD: Hemoglobin: 7 g/dL — ABNORMAL LOW (ref 13.0–17.0)

## 2011-02-16 LAB — PREPARE RBC (CROSSMATCH)

## 2011-02-16 MED ORDER — THIAMINE HCL 100 MG/ML IJ SOLN
100.0000 mg | Freq: Every day | INTRAMUSCULAR | Status: DC
  Administered 2011-02-23 – 2011-02-25 (×2): 100 mg via INTRAVENOUS
  Filled 2011-02-16 (×11): qty 1

## 2011-02-16 MED ORDER — ACETAMINOPHEN 325 MG PO TABS
650.0000 mg | ORAL_TABLET | Freq: Once | ORAL | Status: AC
  Administered 2011-02-16: 650 mg via ORAL
  Filled 2011-02-16: qty 2

## 2011-02-16 MED ORDER — LORAZEPAM 2 MG/ML IJ SOLN
1.0000 mg | Freq: Once | INTRAMUSCULAR | Status: AC
  Administered 2011-02-16: 1 mg via INTRAVENOUS
  Filled 2011-02-16: qty 1

## 2011-02-16 MED ORDER — DIPHENHYDRAMINE HCL 25 MG PO CAPS
25.0000 mg | ORAL_CAPSULE | Freq: Once | ORAL | Status: AC
Start: 2011-02-16 — End: 2011-02-16
  Administered 2011-02-16: 25 mg via ORAL
  Filled 2011-02-16: qty 1

## 2011-02-16 MED ORDER — VITAMIN B-1 100 MG PO TABS
100.0000 mg | ORAL_TABLET | Freq: Every day | ORAL | Status: DC
  Administered 2011-02-16 – 2011-02-24 (×8): 100 mg via ORAL
  Filled 2011-02-16 (×11): qty 1

## 2011-02-16 MED ORDER — HALOPERIDOL LACTATE 5 MG/ML IJ SOLN: 5.0000 mg | Freq: Once | INTRAMUSCULAR | Status: DC

## 2011-02-16 MED ORDER — LORAZEPAM 1 MG PO TABS: 1.0000 mg | ORAL_TABLET | Freq: Four times a day (QID) | ORAL | Status: AC | PRN

## 2011-02-16 MED ORDER — HALOPERIDOL LACTATE 5 MG/ML IJ SOLN
INTRAMUSCULAR | Status: AC
  Filled 2011-02-16: qty 1

## 2011-02-16 MED ORDER — LORAZEPAM 2 MG/ML IJ SOLN
1.0000 mg | Freq: Four times a day (QID) | INTRAMUSCULAR | Status: AC | PRN
  Administered 2011-02-16 – 2011-02-17 (×2): 1 mg via INTRAVENOUS
  Filled 2011-02-16 (×2): qty 1

## 2011-02-16 MED ORDER — ADULT MULTIVITAMIN W/MINERALS CH
1.0000 | ORAL_TABLET | Freq: Every day | ORAL | Status: DC
  Administered 2011-02-16 – 2011-02-17 (×2): 1 via ORAL
  Filled 2011-02-16 (×3): qty 1

## 2011-02-16 MED ORDER — HALOPERIDOL LACTATE 5 MG/ML IJ SOLN
2.0000 mg | Freq: Once | INTRAMUSCULAR | Status: AC
  Administered 2011-02-16: 2 mg via INTRAVENOUS
  Filled 2011-02-16: qty 1

## 2011-02-16 MED ORDER — FOLIC ACID 1 MG PO TABS
1.0000 mg | ORAL_TABLET | Freq: Every day | ORAL | Status: DC
  Administered 2011-02-16 – 2011-02-25 (×9): 1 mg via ORAL
  Filled 2011-02-16 (×11): qty 1

## 2011-02-16 NOTE — Progress Notes (Signed)
Pt continues to not allow care.  Restraints placed per order.  See documentation. Nino Parsley

## 2011-02-16 NOTE — Discharge Summary (Signed)
Physician Discharge Summary  Patient ID: [name] MRN: [id number] DOB/AGE: 1926/08/19 78 y.o.  Admit date: 02/15/2011 Discharge date: 02/16/2011  Admission Diagnoses: Radiation cystitis. CaP.   Discharge Diagnoses: 1. Prostate Cancer 2. Radiation cystitis Active Problems:  * No active hospital problems. *    Discharged Condition: Improved  Hospital Course:Cysto, fulgeration of arterial bladder mucosal bleed  Consults: none}  Significant Diagnostic Studies: labs: Hgb/Hct  Treatments: Cysto fulgeratin of arterial bladder mucosal bleed  Discharge Exam: Blood pressure 124/73, pulse 93, temperature 97.8 F (36.6 C), temperature source Oral, resp. rate 18, height 5\' 6"  (1.676 m), weight 78.472 kg (173 lb), SpO2 97.00%. General appearance: alert and cooperative  Disposition: Home-Health Care Svc  Discharge Orders    Future Appointments: Provider: Department: Dept Phone: Center:   08/03/2011 1:00 PM Oliver Barre, MD [facility] [phone] [facility]     Future Orders Please Complete By Expires   Diet - low sodium heart healthy      Increase activity slowly      Foley catheter - discontinue      Scheduling Instructions:   D/C IVnow, and D/C CBI now. D/C foley if urine clear at noon, and D/C post voiding trial.   Discontinue IV        Medication List  As of 02/16/2011  8:30 AM   TAKE these medications         alendronate 70 MG tablet   Commonly known as: FOSAMAX   Take 70 mg by mouth every 7 (seven) days. Take with a full glass of water on an empty stomach.      aspirin 81 MG tablet   Take 81 mg by mouth daily.      atorvastatin 20 MG tablet   Commonly known as: LIPITOR   Take 20 mg by mouth daily.      clopidogrel 75 MG tablet   Commonly known as: PLAVIX   Take 75 mg by mouth daily. plavix stopped august 24      clotrimazole-betamethasone cream   Commonly known as: LOTRISONE   Apply 1 application topically 2 (two) times daily as needed. For rash on/around buttocks.      cyanocobalamin 100 MCG tablet   Take 100 mcg by mouth daily.      metoprolol succinate 25 MG 24 hr tablet   Commonly known as: TOPROL-XL   Take 1 tablet (25 mg total) by mouth daily.      mulitivitamin with minerals Tabs   Take 1 tablet by mouth daily.      omeprazole 40 MG capsule   Commonly known as: PRILOSEC   Take 40 mg by mouth daily.      valsartan 80 MG tablet   Commonly known as: DIOVAN   Take 80 mg by mouth daily.             SignedJethro Bolus I 02/16/2011, 8:30 AM

## 2011-02-16 NOTE — Op Note (Signed)
[name], [name]                ACCOUNT NO.:  [id number]  MEDICAL RECORD NO.:  [id number]  LOCATION:  1418                         FACILITY:  [facility]  PHYSICIAN:  Excell Seltzer. Annabell Howells, M.D.    DATE OF BIRTH:  November 05, 1926  DATE OF PROCEDURE:  02/15/2011 DATE OF DISCHARGE:                              OPERATIVE REPORT   Patient of Dr. Bjorn Pippin.  PROCEDURE:  Cystoscopy, evacuation of clots with biopsy and fulguration of bleeder.  PREOPERATIVE DIAGNOSIS:  Clot retention.  POSTOPERATIVE DIAGNOSIS:  Clot retention with hemorrhagic radiation cystitis.  SURGEON:  Excell Seltzer. Annabell Howells, MD  ANESTHESIA:  General.  SPECIMEN:  None.  BLOOD LOSS:  Approximately 300 cc of clots.  DRAIN:  22-French 3-way Foley catheter.  SPECIMEN:  Bladder biopsy.  COMPLICATIONS:  None.  INDICATIONS:  Mr. [name] is an 78 year old white male with a history of prostate cancer, treated remotely with prostatectomy followed 5 years later with radiation therapy for PSA recurrence.  He had an aortic valve replaced back in the fall and is currently on aspirin and Plavix and over the past week, he has had intermittent gross hematuria and presented today in clot retention.  In the office, he underwent catheter placement with evacuation of clots followed by cystoscopy which revealed an arterial bleeder at the trigone.  At that time, I felt a possible neoplasm was responsible for the bleeding.  A Foley catheter was re-inserted and he was set up for cystoscopy with fulguration.  FINDINGS OF PROCEDURE:  He was given Cipro in the office, but because of his valve, he was given ampicillin and gentamicin prior to this procedure.  He was fitted with PAS hose and general anesthetic was induced.  He was placed in lithotomy position.  His perineum and genitalia were prepped with Betadine solution and he was draped in usual sterile fashion.  Cystoscopy was performed using the 22-French scope and 12 degree lens. Examination  revealed a normal urethra.  The external sphincter was intact.  The prostatic urethra was absent from his prior prostatectomy and there was no evidence of bladder neck contraction, although the bladder neck was fixed because of prior surgery and radiation.  Initial inspection of bladder revealed a very large clot in the bladder.  A Toomey syringe was used to evacuate the clot which was about 300 cc in volume.  Once the clot was evacuated, inspection of the bladder revealed an active arterial bleeder in the mid trigone with evidence of extensive hemorrhagic radiation cystitis, primarily in the trigone and posterior wall and lateral walls of the bladder.  The bladder had moderate trabeculation, no papillary tumors or stones were noted.  Ureteral orifices were not readily identified and the radiation changes.  After inspection, a Bugbee electrode was used to fulgurate the arterial bleeder as well as some additional venous oozing in the surrounding area.  Total area of approximately 2-3 cm diameter was fulgurated. After bleeding had been stopped, a single cup biopsy forceps was obtained from an area adjacent to the fulgurated area to confirm the diagnosis and additional fulguration was then performed at the biopsy site.  Upon removal of the scope, he was noted to have some  bleeding from the bladder neck.  This was also managed with Bugbee fulguration.  At this point, the scope was removed.  Then, a 22-French 3-way Foley catheter was inserted.  The balloon was filled with 30 cc of sterile fluid and catheter was placed on continuous irrigation and straight drainage.  The irrigant return was clear.  The patient was then taken down from lithotomy position.  His anesthetic was reversed.  He was moved to recovery room in stable condition.  There were no complications.     Excell Seltzer. Annabell Howells, M.D.     JJW/MEDQ  D:  02/15/2011  T:  02/16/2011  Job:  [id number]  cc:   Lynelle Smoke I. Patsi Sears,  M.D. Fax: [phone]  Dr. Oliver Barre

## 2011-02-16 NOTE — Progress Notes (Signed)
Pt combative, irrational, demanding we let him out of the hospital.  Called security to assist with safe care.  O2 sat 87%, pt refused to allow O2 to be placed on.  Verbally calling out and resisting care.  Notified MD. Nino Parsley

## 2011-02-16 NOTE — Consult Note (Signed)
Medical Consultation  [name] ZOX:[id number] DOB: 10-20-1926 DOA: 02/15/2011  Referring physician: Jethro Bolus, M.D.  PCP: Oliver Barre, MD, MD  Cardiologist: Rollene Rotunda, M.D. Cardiothoracic surgeon: Tressie Stalker, M.D.  Date of consultation: 02/16/2011  Reason for consultation: Confusion, hypoxia  Chief Complaint: No complaints.  HPI:  78 year old man with a history of prostate cancer and radiation in the past who presented to the hospital on January 28 with a history of gross hematuria. He was admitted by the urology service for her definitive treatment. He underwent cystoscopy with biopsy and fulguration the same day. Since that time he has had no bleeding per Dr. Patsi Sears. He was doing clinically well and discharge was anticipated to today. However the patient became agitated, confused and was noted to be hypoxic. I was asked to see the patient to further evaluate these findings.  The patient is quite pleasant currently sitting in a chair with soft restraints. He is confused and can not tell me why he is here in the hospital. He is unable to provide any history but he does not complaints at this point as detailed below and the review of systems. Review of records is summarized below.   02/12/2011: Cardiothoracic outpatient appointment: Followup status post aortic valve replacement and CABG 09/23/2010. According to wife patient with intermittent problems of confusion. Continues to drink significant amounts of alcohol. He reported never drinking "more than one beer" when he is riding his motorcycle. Underlying dementia was suspected.  01/29/2011: Cardiology outpatient appointment: Followup aortic valve replacement. Noted to have increasing confusion. Balance disturbance noted.  September 2012: Hospitalization: Aortic valve replacement and coronary artery bypass grafting x1. Postextubation the patient was agitated and confused. CT of the head obtained at that time was  unremarkable. Neurology was consulted and felt that the patient's altered mental status was multifactorial: Recent surgery, alcohol withdrawal and underlying baseline dementia. Respiratory was recommended. EEG was nonspecific. Mental status improved slowly.   Review of Systems:  Patient denies changes to his vision, sore throat, rash, muscle aches, chest pain, shortness of breath, dysuria, bleeding, nausea, vomiting, abdominal pain.  Past Medical History  Diagnosis Date  . ADENOCARCINOMA, PROSTATE   . ALLERGIC RHINITIS   . B12 DEFICIENCY   . BENIGN PROSTATIC HYPERTROPHY   . CAROTID ARTERY DISEASE   . CEREBROVASCULAR ACCIDENT, HX OF   . CORONARY ARTERY DISEASE   . DIVERTICULOSIS, COLON   . GERD   . HEMORRHOIDS   . HIATAL HERNIA   . HYPERLIPIDEMIA   . HYPERTENSION   . OSTEOPENIA   . PEPTIC ULCER DISEASE   . PERIPHERAL VASCULAR DISEASE   . Personal history of alcoholism   . PROSTATE CANCER, HX OF   . PUD, HX OF   . TRANSIENT ISCHEMIC ATTACK   . Shingles   . Anemia   . AS (aortic stenosis)   . S/P AVR (aortic valve replacement) 09/23/2010  . Complication of anesthesia 09/23/2010    very confused after waking up. Stopped drinking 40 days before this surgery.   Past Surgical History  Procedure Date  . Popliteal synovial cyst excision   . Right carotid endarterectomy 1999    Left carotid total chronic occlusion  . Prostate surgery   . Cardic stent 2004  . Aortic valve replacement 09/23/2010    #5mm [facility] Ease pericardial tissue valve  . Coronary artery bypass graft 09/23/2010    CABG x1 using LIMA to LAD   Social History:  reports that he quit smoking about  53 years ago. He does not have any smokeless tobacco history on file. He reports that he drinks about 21 ounces of alcohol per week. He reports that he does not use illicit drugs.  Allergies  Allergen Reactions  . Tolterodine Tartrate    Family History  Problem Relation Age of Onset  . Colon cancer Mother 67  .  Diabetes Sister   . Hypertension Sister   . Coronary artery disease Sister   . Multiple sclerosis Sister   . Stroke Brother     Prior to Admission medications   Medication Sig Start Date End Date Taking? Authorizing Provider  alendronate (FOSAMAX) 70 MG tablet Take 70 mg by mouth every 7 (seven) days. Take with a full glass of water on an empty stomach.    Yes Historical Provider, MD  aspirin 81 MG tablet Take 81 mg by mouth daily.     Yes Historical Provider, MD  atorvastatin (LIPITOR) 20 MG tablet Take 20 mg by mouth daily.     Yes Historical Provider, MD  clopidogrel (PLAVIX) 75 MG tablet Take 75 mg by mouth daily. plavix stopped august 24   Yes Historical Provider, MD  clotrimazole-betamethasone (LOTRISONE) cream Apply 1 application topically 2 (two) times daily as needed. For rash on/around buttocks.   Yes Historical Provider, MD  cyanocobalamin 100 MCG tablet Take 100 mcg by mouth daily.   Yes Historical Provider, MD  metoprolol succinate (TOPROL-XL) 25 MG 24 hr tablet Take 1 tablet (25 mg total) by mouth daily. 01/29/11  Yes Rollene Rotunda, MD  Multiple Vitamin (MULITIVITAMIN WITH MINERALS) TABS Take 1 tablet by mouth daily.   Yes Historical Provider, MD  omeprazole (PRILOSEC) 40 MG capsule Take 40 mg by mouth daily.     Yes Historical Provider, MD  valsartan (DIOVAN) 80 MG tablet Take 80 mg by mouth daily.     Yes Historical Provider, MD   Physical Exam: Blood pressure 124/73, pulse 93, temperature 97.8 F (36.6 C), temperature source Oral, resp. rate 18, height 5\' 6"  (1.676 m), weight 78.472 kg (173 lb), SpO2 100.00%. Filed Vitals:   02/16/11 0535 02/16/11 1315 02/16/11 1325 02/16/11 1336  BP: 124/73     Pulse: 93     Temp: 97.8 F (36.6 C)     TempSrc: Oral     Resp: 18     Height:      Weight:      SpO2: 97% 87% 98% 100%     General:  Initially quite agitated as I enter the room. Sitting in a chair in soft restraints. However he immediately became quite calm and  conversant when I sat down to speak with him. Calm and pleasant. Obviously confused.  Eyes: Pupils equal, round, reactive to light. Normal lids, irises, conjunctiva.  ENT: Normal lips and tongue. Grossly normal hearing.  Neck: No lymphadenopathy or masses. No thyromegaly.  Cardiovascular: Tachycardic, regular rhythm.Premature beats noted. No murmur rub or gallop. No lower extremity edema.  Respiratory: Clear to auscultation bilaterally. No wheezes, rales, rhonchi. Normal respiratory effort.  Abdomen: Soft, nontender, nondistended.  Skin: Appears grossly unremarkable.  Musculoskeletal: Grossly normal tone and strength in appearance.  Psychiatric: Initially agitated but then calmed down. Pleasant. Oriented to himself only. He does not know why he is in the hospital nor is he quite sure the month or year  Neurologic: Grossly nonfocal.  Labs:  CBC:  Lab 02/16/11 0400 02/15/11 1220  WBC -- 12.2*  NEUTROABS -- --  HGB 7.0* 10.0*  HCT  20.5* 30.1*  MCV -- 90.7  PLT -- 225   Repeat CBC and basic metabolic panel pending at this time. Patient would not allow blood draw earlier.   Impression/Recommendations 1. Acute encephalopathy/presumed alcohol withdrawal: Given the patient's history as I have detailed above I think we may be conservative in our evaluation of the patient and treat for alcohol withdrawal. I would not suggest other evaluation at this point. CIWA protocol. 2. Alcohol abuse/dependence: Suggest social work consult for counseling. 3. Transient hypoxia: Per my interview with the patient his respiratory effort appears normal. Oxygen saturation was 100% on room air. I suspect his transient hypoxia was secondary to agitation. No further evaluation is suggested at this time. 4. Acute blood loss anemia: I would suggest repeating a CBC at this time. He is no longer bleeding and his blood loss is expected from his hematuria. If his hemoglobin is less than 7 I would suggest  transfusion. However he does appear to be asymptomatic. 5. Gross hematuria: Status post cystoscopy and fulguration. Per urology. 6. History of CABG/aortic tissue valve replacement: Resume aspirin and Plavix when able. 7. History of prostate cancer.  Dr. Brien Few will followup again in the morning. Please contact me if I can be of assistance in the meanwhile today. Thank you for this consultation. I discussed my clinical impression with Dr. Patsi Sears by phone.  Brendia Sacks, MD  [facility] Pager [phone] 02/16/2011, 1:40 PM

## 2011-02-16 NOTE — Progress Notes (Signed)
CRITICAL VALUE ALERT  Critical value received:  Hgb 6.7  Date of notification:  02/16/2011  Time of notification:  1740  Critical value read back:yes  Nurse who received alert:  K. Vear Clock RN  MD notified (1st page):  Irene Limbo  Time of first page: 1745  MD notified (2nd page):  Time of second page:  Responding MD: Irene Limbo  Time MD responded:  1800

## 2011-02-17 ENCOUNTER — Inpatient Hospital Stay (HOSPITAL_COMMUNITY): Payer: Medicare Other

## 2011-02-17 ENCOUNTER — Encounter (HOSPITAL_COMMUNITY): Payer: Self-pay | Admitting: Urology

## 2011-02-17 LAB — CBC
Hemoglobin: 9 g/dL — ABNORMAL LOW (ref 13.0–17.0)
MCH: 29.9 pg (ref 26.0–34.0)
MCHC: 33.7 g/dL (ref 30.0–36.0)
MCV: 88.7 fL (ref 78.0–100.0)
Platelets: 181 10*3/uL (ref 150–400)
RBC: 3.01 MIL/uL — ABNORMAL LOW (ref 4.22–5.81)

## 2011-02-17 LAB — BASIC METABOLIC PANEL
CO2: 23 mEq/L (ref 19–32)
Calcium: 8.7 mg/dL (ref 8.4–10.5)
Creatinine, Ser: 2.3 mg/dL — ABNORMAL HIGH (ref 0.50–1.35)
GFR calc non Af Amer: 25 mL/min — ABNORMAL LOW (ref >90)
Glucose, Bld: 147 mg/dL — ABNORMAL HIGH (ref 70–99)

## 2011-02-17 LAB — ABO/RH: ABO/RH(D): O NEG

## 2011-02-17 MED ORDER — CLONIDINE HCL 0.2 MG/24HR TD PTWK
0.2000 mg | MEDICATED_PATCH | TRANSDERMAL | Status: DC
  Administered 2011-02-17 – 2011-02-24 (×2): 0.2 mg via TRANSDERMAL
  Filled 2011-02-17 (×2): qty 1

## 2011-02-17 MED ORDER — HYDRALAZINE HCL 20 MG/ML IJ SOLN
INTRAMUSCULAR | Status: AC
  Administered 2011-02-17: 10 mg via INTRAVENOUS
  Filled 2011-02-17: qty 1

## 2011-02-17 MED ORDER — HYDRALAZINE HCL 20 MG/ML IJ SOLN
10.0000 mg | Freq: Once | INTRAMUSCULAR | Status: AC
  Administered 2011-02-17: 10 mg via INTRAVENOUS

## 2011-02-17 MED ORDER — HYDRALAZINE HCL 20 MG/ML IJ SOLN
10.0000 mg | Freq: Once | INTRAMUSCULAR | Status: DC
  Filled 2011-02-17: qty 1

## 2011-02-17 MED FILL — Mupirocin Oint 2%: CUTANEOUS | Qty: 22 | Status: AC

## 2011-02-17 NOTE — Progress Notes (Signed)
Pt is exhibiting calm behavior. Will trial out of restraints. Safety sitter at bedside. Will continue to monitor.  [name] 02/17/2011

## 2011-02-17 NOTE — Progress Notes (Signed)
Pt has remained calm out of restraints. Safety sitter remains at bedside. Will continue to monitor.  Leonor Liv 02/17/2011

## 2011-02-17 NOTE — Progress Notes (Signed)
  Subjective: Patient reports Non-communicative, somulent, DT's  Objective: Vital signs in last 24 hours: Temp:  [97.4 F (36.3 C)-98.5 F (36.9 C)] 97.7 F (36.5 C) (01/30 0600) Pulse Rate:  [92-111] 92  (01/30 0600) Resp:  [16-18] 16  (01/30 0600) BP: (158-225)/(70-122) 189/84 mmHg (01/30 0600) SpO2:  [87 %-100 %] 100 % (01/30 0600)A  Intake/Output from previous day: 01/29 0701 - 01/30 0700 In: 822.5 [P.O.:560; Blood:262.5] Out: 1200 [Urine:1200] Intake/Output this shift:    Past Medical History  Diagnosis Date  . ADENOCARCINOMA, PROSTATE   . ALLERGIC RHINITIS   . B12 DEFICIENCY   . BENIGN PROSTATIC HYPERTROPHY   . CAROTID ARTERY DISEASE   . CEREBROVASCULAR ACCIDENT, HX OF   . CORONARY ARTERY DISEASE   . DIVERTICULOSIS, COLON   . GERD   . HEMORRHOIDS   . HIATAL HERNIA   . HYPERLIPIDEMIA   . HYPERTENSION   . OSTEOPENIA   . PEPTIC ULCER DISEASE   . PERIPHERAL VASCULAR DISEASE   . Personal history of alcoholism   . PROSTATE CANCER, HX OF   . PUD, HX OF   . TRANSIENT ISCHEMIC ATTACK   . Shingles   . Anemia   . AS (aortic stenosis)   . S/P AVR (aortic valve replacement) 09/23/2010  . Complication of anesthesia 09/23/2010    very confused after waking up. Stopped drinking 40 days before this surgery.    Physical Exam:  General:condon cath: pt had Cunningham clamp placed by wife ( unknown to staff). Now gross hematuria. Pt sleep[ing post Ativan. Lungs - Normal respiratory effort, chest expands symmetrically.  Abdomen - Soft, non-tender & non-distended.  Lab Results:  Basename 02/17/11 0453 02/16/11 1710 02/16/11 0400 02/15/11 1220  WBC 9.3 7.5 -- 12.2*  HGB 9.0* 6.7* 7.0* --  HCT 26.7* 19.9* 20.5* --   BMET  Basename 02/17/11 0453 02/16/11 1710  NA 135 136  K 3.6 3.6  CL 102 103  CO2 23 26  GLUCOSE 147* 98  BUN 20 16  CREATININE 2.30* 1.18  CALCIUM 8.7 8.5   No results found for this basename: LABURIN:1 in the last 72 hours Results for orders placed  during the hospital encounter of 02/15/11  SURGICAL PCR SCREEN     Status: Normal   Collection Time   02/15/11 12:18 PM      Component Value Range Status Comment   MRSA, PCR NEGATIVE  NEGATIVE  Final    Staphylococcus aureus NEGATIVE  NEGATIVE  Final     Studies/Results: @RISRSLT24 @  Assessment/Plan:  Pt has 70cc output, but now bloody again, and Cr. Elevated to 2.30. He has acute hypertensive episode, and received hydralazine. ( 189/84 post hydralazine). He is post transfusion ( 1 unit), with increase HCT. Will cancel discharge. Change to regular admission. Rx per [facility].   Kayleen Alig I 02/17/2011, 8:44 AM

## 2011-02-17 NOTE — Progress Notes (Signed)
Charted for Maceo Pro, RN:  Pt's BP elevated 210/104.  Notified Dr. Patsi Sears who instructed to call [facility].  Gwinda Passe, PA called and instructed to give Hydralazine 10mg .  Administered med to pt and follow-up BP was 192/98.  Notified PA of follow-up BP.  No new orders. Nino Parsley

## 2011-02-17 NOTE — Progress Notes (Signed)
Bladder scanned at 5:30am; > 200 cc found. In and Out cath performed by RN. 700 cc urine output from cath. Dark, bloody in color, no odor.

## 2011-02-17 NOTE — Progress Notes (Signed)
Subjective: Agitated overnight, requiring restraints.  Objective: Vital signs in last 24 hours: Temp:  [97.4 F (36.3 C)-98.5 F (36.9 C)] 97.7 F (36.5 C) (01/30 0600) Pulse Rate:  [92-111] 92  (01/30 0600) Resp:  [16-18] 16  (01/30 0600) BP: (158-225)/(70-122) 189/84 mmHg (01/30 0600) SpO2:  [87 %-100 %] 100 % (01/30 0600) Weight change:  Last BM Date:  (PTA (Pt does not recall))  Intake/Output from previous day: 01/29 0701 - 01/30 0700 In: 822.5 [P.O.:560; Blood:262.5] Out: 1200 [Urine:1200] Total I/O In: -  Out: 400 [Urine:400]   Physical Exam: General: Somnolent, but responsive to tactile and pain stimuli. Not short of breath at rest.  HEENT:  Mild clinical pallor, no jaundice, no conjunctival injection or discharge. Appears "Dry, clinically". NECK:  Supple, JVP not seen, no carotid bruits, no palpable lymphadenopathy, no palpable goiter. CHEST:  Clinically clear to auscultation, no wheezes, no crackles. HEART:  Sounds 1 and 2 heard, normal, regular, no murmurs. ABDOMEN:  Full, soft, non-tender, no palpable organomegaly, no palpable masses, normal bowel sounds. GENITALIA:  Normal external genitalia. LOWER EXTREMITIES:  No pitting edema, palpable peripheral pulses. MUSCULOSKELETAL SYSTEM:  Generalized osteoarthritic changes, otherwise, normal. CENTRAL NERVOUS SYSTEM:  Not formally examined, secondary to lack of cooperation.  Lab Results:  Basename 02/17/11 0453 02/16/11 1710  WBC 9.3 7.5  HGB 9.0* 6.7*  HCT 26.7* 19.9*  PLT 181 176    Basename 02/17/11 0453 02/16/11 1710  NA 135 136  K 3.6 3.6  CL 102 103  CO2 23 26  GLUCOSE 147* 98  BUN 20 16  CREATININE 2.30* 1.18  CALCIUM 8.7 8.5   Recent Results (from the past 240 hour(s))  SURGICAL PCR SCREEN     Status: Normal   Collection Time   02/15/11 12:18 PM      Component Value Range Status Comment   MRSA, PCR NEGATIVE  NEGATIVE  Final    Staphylococcus aureus NEGATIVE  NEGATIVE  Final       Studies/Results: No results found.  Medications: Scheduled Meds:   . acetaminophen  650 mg Oral Once  . ampicillin (OMNIPEN) IV  1 g Intravenous Q6H  . diphenhydrAMINE  25 mg Oral Once  . docusate sodium  100 mg Oral BID  . folic acid  1 mg Oral Daily  . gentamicin  200 mg Intravenous Q24H  . haloperidol lactate  2 mg Intravenous Once  . hydrALAZINE  10 mg Intravenous Once  . hydrALAZINE  10 mg Intravenous Once  . LORazepam  1 mg Intravenous Once  . metoprolol succinate  25 mg Oral Daily  . mulitivitamin with minerals  1 tablet Oral Daily  . olmesartan  10 mg Oral Daily  . pantoprazole  40 mg Oral Q1200  . thiamine  100 mg Oral Daily   Or  . thiamine  100 mg Intravenous Daily  . DISCONTD: haloperidol lactate  5 mg Intramuscular Once   Continuous Infusions:   . dextrose 5 % and 0.45 % NaCl with KCl 20 mEq/L 100 mL/hr at 02/16/11 0611  . sodium chloride irrigation 3,000 mL (02/15/11 1854)   PRN Meds:.acetaminophen, HYDROcodone-acetaminophen, hyoscyamine, LORazepam, LORazepam, morphine, ondansetron, zolpidem  Assessment/Plan:  Active Problems:   1. Acute encephalopathy/presumed alcohol withdrawal: This is based on patient's history of ingestion of about 21 ounces of alcohol per week. Patient is currently on CIWA protocol. 2. Alcohol abuse/dependence: Suggest social work consult for counseling. 3. Transient hypoxia: Oxygen saturation is currently > 94 % on O2, but reportedly,  periodically drops into the 80s. Will check CXR to rule out atelectasis or other pathology. 4. Acute blood loss anemia: Secondary to gross hematuria. Patient is now status post transfusion of PRBC and Hb appears reasonable. 5. Gross hematuria: Status post cystoscopy and fulguration. Per urology. 6. History of CABG/aortic tissue valve replacement: Resume aspirin and Plavix okayed by primary. 5. History of prostate cancer. Stable. 6. Dehydration/ARF: Secondary to volume depletion. Increase ivi fluids to  125 cc/hr, and hold ARB 7. HTN: This is uncontrolled. Add Clonidine patch to Rx.   LOS: 2 days   Keston Seever,CHRISTOPHER 02/17/2011, 9:17 AM

## 2011-02-18 ENCOUNTER — Other Ambulatory Visit: Payer: Self-pay

## 2011-02-18 LAB — COMPREHENSIVE METABOLIC PANEL
AST: 24 U/L (ref 0–37)
Albumin: 2.6 g/dL — ABNORMAL LOW (ref 3.5–5.2)
Calcium: 8.3 mg/dL — ABNORMAL LOW (ref 8.4–10.5)
Creatinine, Ser: 1.27 mg/dL (ref 0.50–1.35)
GFR calc non Af Amer: 51 mL/min — ABNORMAL LOW (ref >90)
Total Protein: 5.9 g/dL — ABNORMAL LOW (ref 6.0–8.3)

## 2011-02-18 LAB — CBC
MCH: 30.4 pg (ref 26.0–34.0)
MCHC: 33.6 g/dL (ref 30.0–36.0)
MCV: 90.4 fL (ref 78.0–100.0)
Platelets: 169 10*3/uL (ref 150–400)
RDW: 14.4 % (ref 11.5–15.5)

## 2011-02-18 MED ORDER — ELDERTONIC PO ELIX
15.0000 mL | ORAL_SOLUTION | Freq: Every day | ORAL | Status: DC
  Administered 2011-02-18 – 2011-02-25 (×7): 15 mL via ORAL
  Filled 2011-02-18 (×9): qty 15

## 2011-02-18 MED ORDER — METOPROLOL SUCCINATE ER 25 MG PO TB24: 25.0000 mg | ORAL_TABLET | Freq: Every day | ORAL | Status: DC

## 2011-02-18 MED ORDER — FUROSEMIDE 10 MG/ML IJ SOLN
40.0000 mg | Freq: Once | INTRAMUSCULAR | Status: AC
  Administered 2011-02-18: 40 mg via INTRAVENOUS
  Filled 2011-02-18: qty 4

## 2011-02-18 MED ORDER — KCL IN DEXTROSE-NACL 10-5-0.45 MEQ/L-%-% IV SOLN
INTRAVENOUS | Status: DC
  Administered 2011-02-18 – 2011-02-21 (×5): via INTRAVENOUS
  Filled 2011-02-18 (×10): qty 1000

## 2011-02-18 MED ORDER — VALSARTAN 80 MG PO TABS: 80.0000 mg | ORAL_TABLET | Freq: Every day | ORAL | Status: DC

## 2011-02-18 MED ORDER — CLOPIDOGREL BISULFATE 75 MG PO TABS: 75.0000 mg | ORAL_TABLET | Freq: Every day | ORAL | Status: DC

## 2011-02-18 MED ORDER — ATORVASTATIN CALCIUM 20 MG PO TABS: 20.0000 mg | ORAL_TABLET | Freq: Every day | ORAL | Status: DC

## 2011-02-18 MED ORDER — ALENDRONATE SODIUM 70 MG PO TABS: 70.0000 mg | ORAL_TABLET | ORAL | Status: DC

## 2011-02-18 MED ORDER — ELDERTONIC PO ELIX: 15.0000 mL | ORAL_SOLUTION | Freq: Every day | ORAL | Status: DC

## 2011-02-18 NOTE — Progress Notes (Signed)
3 Days Post-Op Subjective: Patient reports hematuria.  Objective: Vital signs in last 24 hours: Temp:  [98 F (36.7 C)-98.3 F (36.8 C)] 98.2 F (36.8 C) (01/31 0607) Pulse Rate:  [76-102] 99  (01/31 0607) Resp:  [16-18] 16  (01/31 0607) BP: (145-165)/(64-108) 145/83 mmHg (01/31 0607) SpO2:  [99 %-100 %] 99 % (01/31 0607)  Intake/Output from previous day: 01/30 0701 - 01/31 0700 In: 5614.2 [P.O.:390; I.V.:5119.2; IV Piggyback:105] Out: 600 [Urine:600] Intake/Output this shift:    Physical Exam:  General:alert, cooperative and no distress GI: soft and tenderness: suprapubic Male genitalia: Penis: normal, no lesions Resp: clear to auscultation bilaterally  Lab Results:  Basename 02/18/11 0447 02/17/11 0453 02/16/11 1710  HGB 7.6* 9.0* 6.7*  HCT 22.6* 26.7* 19.9*   BMET  Basename 02/18/11 0447 02/17/11 0453  NA 136 135  K 3.6 3.6  CL 105 102  CO2 24 23  GLUCOSE 117* 147*  BUN 13 20  CREATININE 1.27 2.30*  CALCIUM 8.3* 8.7   No results found for this basename: LABPT:3,INR:3 in the last 72 hours No results found for this basename: LABURIN:1 in the last 72 hours Results for orders placed during the hospital encounter of 02/15/11  SURGICAL PCR SCREEN     Status: Normal   Collection Time   02/15/11 12:18 PM      Component Value Range Status Comment   MRSA, PCR NEGATIVE  NEGATIVE  Final    Staphylococcus aureus NEGATIVE  NEGATIVE  Final     Studies/Results: Dg Chest Port 1 View  02/17/2011  *RADIOLOGY REPORT*  Clinical Data: Hypoxia.  Shortness of breath.  Aortic valve replacement.  Hypertension.  PORTABLE CHEST - 1 VIEW  Comparison: 11/02/2010  Findings: Aortic valve prosthesis noted with increased accentuation of the interstitium.  Low lung volumes are noted. There is confluent opacity in the left lower lobe in the retrocardiac position.  Atherosclerotic calcification of the aortic arch is present.  IMPRESSION:  1.  Abnormal increase in the interstitial  accentuation, query noncardiogenic edema or atypical pneumonia. This is somewhat confluent in the left lower lobe.  Follow-up to clearance is recommended.  Original Report Authenticated By: Dellia Cloud, M.D.    Assessment/Plan: Hematuria status post cystoscopy/fulgration bladder    LOS: 3 days   [facility] 02/18/2011, 7:19 AM

## 2011-02-18 NOTE — Progress Notes (Signed)
Subjective: Much better to day, no longer requiring restraints, alert, communicative, oriented.  Objective: Vital signs in last 24 hours: Temp:  [97.8 F (36.6 C)-98.2 F (36.8 C)] 97.8 F (36.6 C) (01/31 1421) Pulse Rate:  [74-99] 74  (01/31 1421) Resp:  [16-18] 18  (01/31 1421) BP: (139-164)/(64-83) 139/69 mmHg (01/31 1421) SpO2:  [99 %-100 %] 100 % (01/31 1421) Weight change:  Last BM Date:  (PTA)  Intake/Output from previous day: 01/30 0701 - 01/31 0700 In: 5614.2 [P.O.:390; I.V.:5119.2; IV Piggyback:105] Out: 600 [Urine:600] Total I/O In: 3560 [P.O.:560; Other:3000] Out: 5300 [Urine:5300]   Physical Exam: General: Sitting in chair, alert, communicative, fully oriented, not short of breath at rest.  HEENT:  Mild clinical pallor, no jaundice, no conjunctival injection or discharge. Hydration is fair. NECK:  Supple, JVP not seen, no carotid bruits, no palpable lymphadenopathy, no palpable goiter. CHEST:  Clinically clear to auscultation, no wheezes, no crackles. HEART:  Sounds 1 and 2 heard, normal, regular, no murmurs. ABDOMEN:  Full, soft, non-tender, no palpable organomegaly, no palpable masses, normal bowel sounds. GENITALIA:  Not examined. LOWER EXTREMITIES:  No pitting edema, palpable peripheral pulses. MUSCULOSKELETAL SYSTEM:  Generalized osteoarthritic changes, otherwise, normal. CENTRAL NERVOUS SYSTEM:  Not formally examined, secondary to lack of cooperation.  Lab Results:  Basename 02/18/11 0447 02/17/11 0453  WBC 6.0 9.3  HGB 7.6* 9.0*  HCT 22.6* 26.7*  PLT 169 181    Basename 02/18/11 0447 02/17/11 0453  NA 136 135  K 3.6 3.6  CL 105 102  CO2 24 23  GLUCOSE 117* 147*  BUN 13 20  CREATININE 1.27 2.30*  CALCIUM 8.3* 8.7   Recent Results (from the past 240 hour(s))  SURGICAL PCR SCREEN     Status: Normal   Collection Time   02/15/11 12:18 PM      Component Value Range Status Comment   MRSA, PCR NEGATIVE  NEGATIVE  Final    Staphylococcus aureus  NEGATIVE  NEGATIVE  Final      Studies/Results: Dg Chest Port 1 View  02/17/2011  *RADIOLOGY REPORT*  Clinical Data: Hypoxia.  Shortness of breath.  Aortic valve replacement.  Hypertension.  PORTABLE CHEST - 1 VIEW  Comparison: 11/02/2010  Findings: Aortic valve prosthesis noted with increased accentuation of the interstitium.  Low lung volumes are noted. There is confluent opacity in the left lower lobe in the retrocardiac position.  Atherosclerotic calcification of the aortic arch is present.  IMPRESSION:  1.  Abnormal increase in the interstitial accentuation, query noncardiogenic edema or atypical pneumonia. This is somewhat confluent in the left lower lobe.  Follow-up to clearance is recommended.  Original Report Authenticated By: Dellia Cloud, M.D.    Medications: Scheduled Meds:    . cloNIDine  0.2 mg Transdermal Weekly  . docusate sodium  100 mg Oral BID  . folic acid  1 mg Oral Daily  . geriatric multivitamins-minerals  15 mL Oral Daily  . hydrALAZINE  10 mg Intravenous Once  . metoprolol succinate  25 mg Oral Daily  . pantoprazole  40 mg Oral Q1200  . thiamine  100 mg Oral Daily   Or  . thiamine  100 mg Intravenous Daily  . DISCONTD: geriatric multivitamins-minerals  15 mL Oral Daily  . DISCONTD: mulitivitamin with minerals  1 tablet Oral Daily   Continuous Infusions:    . dextrose 5 % and 0.45 % NaCl with KCl 10 mEq/L 75 mL/hr at 02/18/11 1036  . sodium chloride irrigation 3,000 mL (  02/15/11 1854)  . DISCONTD: dextrose 5 % and 0.45 % NaCl with KCl 20 mEq/L 125 mL/hr at 02/18/11 0412   PRN Meds:.acetaminophen, HYDROcodone-acetaminophen, hyoscyamine, LORazepam, LORazepam, morphine, ondansetron, zolpidem  Assessment/Plan:  Active Problems:   1. Acute encephalopathy/presumed alcohol withdrawal: This is based on patient's history of ingestion of about 21 ounces of alcohol per week. Patient is currently on CIWA protocol, and has responded very well, with improvement  in mental status, back to baseline. 2. Alcohol abuse/dependence: Suggest social work consult for counseling, going forward. 3. Transient hypoxia: Resolved. CXR of 02/17/11, showed abnormal increase in the interstitial accentuation. Suspect this is due to iv fluids. I would be wise to watch fluid status closely. 4. Acute blood loss anemia: Secondary to gross hematuria. Patient is now status post transfusion of PRBC and hematuria is still on-going. Hb is now 7.6. Given history of CAD, a hemoglobin that low, is not really adviseable. Will defer to primary, but would recommend a hemoglobin of at least 8.0. 5. Gross hematuria: Status post cystoscopy and fulguration of bladder lesion. Mange per urology. 6. History of CABG/aortic tissue valve replacement: Resume aspirin and Plavix when okayed by primary. 5. History of prostate cancer. Stable. 6. Dehydration/ARF: Secondary to volume depletion. Now resolved. Agree with decrease in iv fluid rate. 7. HTN: Reasonably controlled. Clonidine patch was added to Rx on 02/17/11.  Comment: Medical team will sign off today. Please call with questions.   LOS: 3 days   Kesia Dalto,CHRISTOPHER 02/18/2011, 4:27 PM

## 2011-02-18 NOTE — Progress Notes (Signed)
Urology Progress Note  3 Days Post-Op   Subjective: Radiation cystitis, post op fulgeration and ETOH withdrawal    No acute urologic events overnight. Ambulation:   negative Flatus:    positive Bowel movement  negative  Pain: complete resolution  Objective:  Blood pressure 145/83, pulse 99, temperature 98.2 F (36.8 C), temperature source Oral, resp. rate 16, height 5\' 6"  (1.676 m), weight 78.472 kg (173 lb), SpO2 99.00%.  Physical Exam:  General:  No acute distress, awake Resp: clear to auscultation bilaterally Genitourinary:  Condom cath in place. Urine blood tinged. No clots. 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 He was clear Tuesday AM, but then developed DT's, and has had a difficult 3 days. He is now awake and alert, with gross hematuria without clots again. He is post 1 unit transfusion, and has a low HGb-but may not be low enough to warrant transfusion today. O2 sat ok this AM.  PLAN: Discussed with RN: Will pass 27 F 3-way cath and irrigate to clear his bladder. If his urine clears, then d/c foley and plan for d/c in AM. Re-ck his Hgb. If, however, he does not clear, then he will need CBI and will need consideration of prostaglandin irrigation.    Catheter not removed.

## 2011-02-18 NOTE — Progress Notes (Signed)
Pt urine output in am was very bloody. After speaking with Dr. Patsi Sears a triple lumen foley catheter was placed. CBI was started. Urine output has continued to be a very dark pink-red in color. No blood clots have been seen. Will continue irrigation, and monitoring.  [name] 02/18/2011 6:55 PM

## 2011-02-19 LAB — CBC
HCT: 25.3 % — ABNORMAL LOW (ref 39.0–52.0)
Hemoglobin: 8.7 g/dL — ABNORMAL LOW (ref 13.0–17.0)
MCV: 89.4 fL (ref 78.0–100.0)
Platelets: 175 10*3/uL (ref 150–400)
RBC: 2.83 MIL/uL — ABNORMAL LOW (ref 4.22–5.81)
WBC: 6.3 10*3/uL (ref 4.0–10.5)

## 2011-02-19 LAB — TYPE AND SCREEN
Antibody Screen: NEGATIVE
Unit division: 0

## 2011-02-19 LAB — BASIC METABOLIC PANEL
CO2: 24 mEq/L (ref 19–32)
Chloride: 101 mEq/L (ref 96–112)
Creatinine, Ser: 1.22 mg/dL (ref 0.50–1.35)

## 2011-02-19 MED ORDER — STERILE WATER FOR IRRIGATION IR SOLN
Status: DC
  Filled 2011-02-19: qty 30

## 2011-02-19 MED ORDER — SODIUM CHLORIDE 0.9 % IR SOLN
Freq: Every day | Status: DC
  Administered 2011-02-19: 11:00:00 via INTRAVESICAL
  Filled 2011-02-19 (×5): qty 40

## 2011-02-19 MED ORDER — SODIUM CHLORIDE 0.9 % IR SOLN
Status: DC
  Filled 2011-02-19 (×2): qty 40

## 2011-02-19 NOTE — Progress Notes (Signed)
4 Days Post-Op Subjective: Patient reports hematuria.  Objective: Vital signs in last 24 hours: Temp:  [97.6 F (36.4 C)-98.9 F (37.2 C)] 97.9 F (36.6 C) (02/01 0605) Pulse Rate:  [73-85] 73  (02/01 0605) Resp:  [16-20] 18  (02/01 0605) BP: (106-163)/(51-82) 106/51 mmHg (02/01 0605) SpO2:  [95 %-100 %] 96 % (02/01 0605)  Intake/Output from previous day: 01/31 0701 - 02/01 0700 In: 16109 [P.O.:1580; I.V.:1097.5; Blood:362.5; IV Piggyback:4] Out: 60454 [Urine:16850] Intake/Output this shift:    Physical Exam:  General:cooperative and no distress GI: not done, soft and tenderness: suprapubic Male genitalia: not done Resp: clear to auscultation bilaterally Cardio: regular rate and rhythm, S1, S2 normal, no murmur, click, rub or gallop  Lab Results:  Basename 02/19/11 0435 02/18/11 0447 02/17/11 0453  HGB 8.7* 7.6* 9.0*  HCT 25.3* 22.6* 26.7*   BMET  Basename 02/19/11 0435 02/18/11 0447  NA 133* 136  K 3.5 3.6  CL 101 105  CO2 24 24  GLUCOSE 100* 117*  BUN 12 13  CREATININE 1.22 1.27  CALCIUM 9.1 8.3*   No results found for this basename: LABPT:3,INR:3 in the last 72 hours No results found for this basename: LABURIN:1 in the last 72 hours Results for orders placed during the hospital encounter of 02/15/11  SURGICAL PCR SCREEN     Status: Normal   Collection Time   02/15/11 12:18 PM      Component Value Range Status Comment   MRSA, PCR NEGATIVE  NEGATIVE  Final    Staphylococcus aureus NEGATIVE  NEGATIVE  Final     Studies/Results: Dg Chest Port 1 View  02/17/2011  *RADIOLOGY REPORT*  Clinical Data: Hypoxia.  Shortness of breath.  Aortic valve replacement.  Hypertension.  PORTABLE CHEST - 1 VIEW  Comparison: 11/02/2010  Findings: Aortic valve prosthesis noted with increased accentuation of the interstitium.  Low lung volumes are noted. There is confluent opacity in the left lower lobe in the retrocardiac position.  Atherosclerotic calcification of the aortic  arch is present.  IMPRESSION:  1.  Abnormal increase in the interstitial accentuation, query noncardiogenic edema or atypical pneumonia. This is somewhat confluent in the left lower lobe.  Follow-up to clearance is recommended.  Original Report Authenticated By: Dellia Cloud, M.D.    Assessment/Plan: Hematuria Continue foley due to blood transfusion; will continue until blood transfusion complete Gross hematuria with CBI urine w/o clots.  Anemia improved post 1 Unit PRBC 02/18/11 Will recheck CBC in am Begin Alum bladder instillation   LOS: 4 days   [name] 02/19/2011, 7:19 AM

## 2011-02-20 LAB — CBC
HCT: 24.9 % — ABNORMAL LOW (ref 39.0–52.0)
Hemoglobin: 8.6 g/dL — ABNORMAL LOW (ref 13.0–17.0)
MCHC: 34.5 g/dL (ref 30.0–36.0)
RDW: 13.8 % (ref 11.5–15.5)
WBC: 6.1 10*3/uL (ref 4.0–10.5)

## 2011-02-20 MED ORDER — SPIRITUS FRUMENTI
1.0000 | Freq: Three times a day (TID) | ORAL | Status: DC | PRN
  Administered 2011-02-20 – 2011-02-22 (×2): 1 via ORAL
  Filled 2011-02-20 (×3): qty 1

## 2011-02-20 MED ORDER — LORAZEPAM 2 MG/ML IJ SOLN
1.0000 mg | Freq: Four times a day (QID) | INTRAMUSCULAR | Status: DC | PRN
  Administered 2011-02-20: 1 mg via INTRAVENOUS
  Filled 2011-02-20 (×2): qty 1

## 2011-02-20 MED ORDER — HALOPERIDOL LACTATE 5 MG/ML IJ SOLN
INTRAMUSCULAR | Status: AC
  Filled 2011-02-20: qty 1

## 2011-02-20 MED ORDER — LORAZEPAM 2 MG/ML IJ SOLN
2.0000 mg | INTRAMUSCULAR | Status: DC | PRN
  Administered 2011-02-21: 2 mg via INTRAMUSCULAR
  Filled 2011-02-20 (×2): qty 1

## 2011-02-20 MED ORDER — LORAZEPAM 2 MG/ML IJ SOLN
1.0000 mg | Freq: Four times a day (QID) | INTRAMUSCULAR | Status: DC | PRN
  Administered 2011-02-20 – 2011-02-21 (×3): 1 mg via INTRAVENOUS
  Filled 2011-02-20 (×3): qty 1

## 2011-02-20 MED ORDER — LORAZEPAM 1 MG PO TABS
1.0000 mg | ORAL_TABLET | Freq: Four times a day (QID) | ORAL | Status: DC | PRN
  Administered 2011-02-20: 1 mg via ORAL
  Filled 2011-02-20: qty 1

## 2011-02-20 MED ORDER — HALOPERIDOL LACTATE 5 MG/ML IJ SOLN
5.0000 mg | Freq: Once | INTRAMUSCULAR | Status: AC
  Administered 2011-02-20: 5 mg via INTRAMUSCULAR

## 2011-02-20 NOTE — Progress Notes (Signed)
Patient still agitated, angry and combative with the restraints, swinging and will not let any one touch him, unable to give IV ativan because he pulled his IV out and refusing restart. When one of the mitten was taken off for patient to take his drink, refused to get it back on then he ripped all the other restraints off and condom cath pulled as well . Dr Berneice Heinrich  Notified for IM ativan. Pt refused to stay in bed up ambulating in the hall with staff. Will continue to assess patient.

## 2011-02-20 NOTE — Progress Notes (Addendum)
5 Days Post-Op  Subjective: 1 - Hematuria / Radiation Cystitis - Pt s/p cysto-fulguration for hemorrhagic cystitis, likley XRT induced and then transition to CBI. Pt pulled catheter out last PM during episode of agitation. As urinre relatively clear before hand have elected to leave it out for trial of void. Urinated x1 so far today with light pink urine and no sensation of PVR.  2 - Agitation - Pt with h/o alcoholism and possible dementia contribuitng. Had episode of acute agitation last PM once his Ativan wore off. Was given IM haldol + IV ativan and retrained temporarily and now doing much better.   Pt still mildly confused this AM, but alert to person, place. Non -combative. Denies abdominal or pelvic pain.  Objective: Vital signs in last 24 hours: Temp:  [97.5 F (36.4 C)-98.5 F (36.9 C)] 97.5 F (36.4 C) (02/02 0600) Pulse Rate:  [62-71] 65  (02/01 2100) Resp:  [18-20] 20  (02/02 0600) BP: (111-146)/(64-82) 146/75 mmHg (02/02 0600) SpO2:  [93 %-95 %] 95 % (02/01 2100) Last BM Date:  (PTA)  Intake/Output from previous day: 02/01 0701 - 02/02 0700 In: 3092.5 [P.O.:1080; I.V.:1262.5] Out: 3175 [Urine:3175] Intake/Output this shift:   GEN: mild confusion, tries to get up at times. In soft restraints. HEENT: NCAT PULM: CTAB CARD: RRR GI: SNTND, No SP ttp GU: no blood at meatus, no palpable bladder, racked urine form void dark pink w/o clots MSK: no c/c/e   Lab Results:   Basename 02/19/11 0435 02/18/11 0447  WBC 6.3 6.0  HGB 8.7* 7.6*  HCT 25.3* 22.6*  PLT 175 169   BMET  Basename 02/19/11 0435 02/18/11 0447  NA 133* 136  K 3.5 3.6  CL 101 105  CO2 24 24  GLUCOSE 100* 117*  BUN 12 13  CREATININE 1.22 1.27  CALCIUM 9.1 8.3*   PT/INR No results found for this basename: LABPROT:2,INR:2 in the last 72 hours ABG No results found for this basename: PHART:2,PCO2:2,PO2:2,HCO3:2 in the last 72 hours  Studies/Results: No results  found.  Anti-infectives: Anti-infectives     Start     Dose/Rate Route Frequency Ordered Stop   02/15/11 2000   gentamicin (GARAMYCIN) 200 mg in dextrose 5 % 100 mL IVPB  Status:  Discontinued        200 mg 105 mL/hr over 60 Minutes Intravenous Every 24 hours 02/15/11 1806 02/17/11 1040   02/15/11 1800   ampicillin (OMNIPEN) 1 g in sodium chloride 0.9 % 50 mL IVPB        1 g 150 mL/hr over 20 Minutes Intravenous Every 6 hours 02/15/11 1658 02/16/11 1220   02/15/11 1153   ampicillin (OMNIPEN) 2 g in sodium chloride 0.9 % 50 mL IVPB        2 g 150 mL/hr over 20 Minutes Intravenous 30 min pre-op 02/15/11 1153 02/15/11 1405   02/15/11 1153   gentamicin (GARAMYCIN) 120 mg in dextrose 5 % 50 mL IVPB        120 mg 106 mL/hr over 30 Minutes Intravenous 30 min pre-op 02/15/11 1153 02/15/11 1414          Assessment/Plan: 1 - Hematuria / Radiation Cystitis - Now on trial of void. If fails, will likely take back for repeat endoscopic eval as pt not tollerating catheters well.  2 - Agitation - Re-initiated prn ativan. RX'd ethanol therapy Q8 hrs as is safest most effective means to prevent ehanol withdrawel.  Pt's wife to come today, will update on treatment  plan.  3 - Clears for now in case needs OR later.  LOS: 5 days    [facility], Koen Antilla 02/20/2011   Called Pt's wife at home number to discuss his current issues further. Wife relates he does have confusion at baseline, worse at night, and does drink daily, though not as heavy recently.  She states that during previous hospitalization he behaved in similar fashion to now and that ethanol treatment helped significantly.  We also discussed the use of restraints which she approves of completely as well as possible neurol or psych consults if we are unable to effectively manage. We lastly discussed possible repeat endoscopic eval which she also approves of if needed.

## 2011-02-20 NOTE — Progress Notes (Signed)
Patient a little calmer and follow some command, able to start IV to restart fluid and give IV ativan. Patient's wife @ the bedside. Condom cath in place drainage pinkish urine. Restraint has been off since patient pulled it off @ 8AM. F/U with plan of care.

## 2011-02-20 NOTE — Progress Notes (Signed)
Patient Calmer, still try to get out of bed at times but with encouragement patient stays back and wife has been at the bedside as well, . Urine output effective now draining clear yellow urine with condom cath intact.
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
## 2011-02-20 NOTE — Progress Notes (Signed)
Called to room by sitter, pt up out of bed, unsteady, very agitated, angry, combative.  Pt pulled out foley.  Would not allow nurses to assist him back to bed, continued to swing arms and get more agitated.  Security team called up to assist.  MD notifed.  Dr. Berneice Heinrich returned called and gave orders for Haldol 5mg  IM x1, Ativan 1mg  prn and restraint orders. Bilateral soft wrist and bilateral soft ankle restraints initiated for safety so that pt no longer pulls at lines or harm himself. Newman Nip [place]

## 2011-02-21 LAB — CBC
Hemoglobin: 7.4 g/dL — ABNORMAL LOW (ref 13.0–17.0)
MCH: 29.8 pg (ref 26.0–34.0)
MCV: 90.3 fL (ref 78.0–100.0)
MCV: 88 fL (ref 78.0–100.0)
Platelets: 199 10*3/uL (ref 150–400)
Platelets: 247 10*3/uL (ref 150–400)
RBC: 2.48 MIL/uL — ABNORMAL LOW (ref 4.22–5.81)
RBC: 3.84 MIL/uL — ABNORMAL LOW (ref 4.22–5.81)
RDW: 13.7 % (ref 11.5–15.5)
WBC: 4.5 10*3/uL (ref 4.0–10.5)
WBC: 9.6 10*3/uL (ref 4.0–10.5)

## 2011-02-21 LAB — PREPARE RBC (CROSSMATCH)

## 2011-02-21 MED ORDER — HALOPERIDOL LACTATE 5 MG/ML IJ SOLN
INTRAMUSCULAR | Status: AC
  Administered 2011-02-21: 5 mg
  Filled 2011-02-21: qty 1

## 2011-02-21 MED ORDER — HYDRALAZINE HCL 20 MG/ML IJ SOLN
10.0000 mg | INTRAMUSCULAR | Status: DC | PRN
  Administered 2011-02-21: 10 mg via INTRAVENOUS

## 2011-02-21 MED ORDER — HALOPERIDOL LACTATE 5 MG/ML IJ SOLN: 5.0000 mg | Freq: Three times a day (TID) | INTRAMUSCULAR | Status: DC | PRN

## 2011-02-21 NOTE — Progress Notes (Signed)
Pt attempted to climb OOB and became very agitated with staff when attempting to tell him to stay in the bed. Pt states that his "dog is outside that door." Attempted reorientation but was unsuccessful. Reminded pt of the last time he acted that way and the restraints. Pt began to settle but was very upset and angry with staff.

## 2011-02-21 NOTE — Progress Notes (Signed)
MD please note -- pt pulled out Foley catheter 02/20/11. Please d/c Hemabate. Thank you.

## 2011-02-21 NOTE — Progress Notes (Signed)
6 Days Post-Op  Subjective: 1 - Hematuria / Radiation Cystitis - Pt s/p cysto-fulguration for hemorrhagic cystitis, likley XRT induced and then transition to CBI. Pt pulled catheter out 2/3 and has been tollerating trial of void since then without clots or retention.  2 - Agitation - Pt with h/o alcoholism and possible dementia contribuitng. Doing much better now on prn ethanol and ativan along with sitter and family visits.  Today [name] is improving clinically. His agitation is improved and his urine is clear. His hgb had dropped some, but this is likely equilibration as no s/s active bleeding.   Objective: Vital signs in last 24 hours: Temp:  [97.4 F (36.3 C)-98.6 F (37 C)] 97.4 F (36.3 C) (02/03 0650) Pulse Rate:  [61-69] 64  (02/03 0650) Resp:  [16-18] 16  (02/03 0650) BP: (116-133)/(63-75) 133/75 mmHg (02/03 0650) SpO2:  [97 %-99 %] 99 % (02/03 0650) Last BM Date:  (PTA)  Intake/Output from previous day: 02/02 0701 - 02/03 0700 In: 1938.8 [P.O.:300; I.V.:1638.8] Out: 1500 [Urine:1500] Intake/Output this shift:    General appearance: alert and no distress Head: Normocephalic, without obvious abnormality, atraumatic Resp: clear to auscultation bilaterally Cardio: regular rate and rhythm, S1, S2 normal, no murmur, click, rub or gallop GI: soft, non-tender; bowel sounds normal; no masses,  no organomegaly Male genitalia: normal, penis: no lesions or discharge. testes: no masses or tenderness. no hernias, condom cath in place. Urine completely clear. No SP tenderness or fullness. Extremities: extremities normal, atraumatic, no cyanosis or edema and no edema, redness or tenderness in the calves or thighs Neurologic: Mental status: Alert, oriented, thought content appropriate, Still with some baseline dementia, but AOx2. Cooperatrive with sitter in room.  Lab Results:   Basename 02/21/11 0531 02/20/11 0745  WBC 4.5 6.1  HGB 7.4* 8.6*  HCT 22.4* 24.9*  PLT 199 223    BMET  Basename 02/19/11 0435  NA 133*  K 3.5  CL 101  CO2 24  GLUCOSE 100*  BUN 12  CREATININE 1.22  CALCIUM 9.1   PT/INR No results found for this basename: LABPROT:2,INR:2 in the last 72 hours ABG No results found for this basename: PHART:2,PCO2:2,PO2:2,HCO3:2 in the last 72 hours  Studies/Results: No results found.  Anti-infectives: Anti-infectives     Start     Dose/Rate Route Frequency Ordered Stop   02/15/11 2000   gentamicin (GARAMYCIN) 200 mg in dextrose 5 % 100 mL IVPB  Status:  Discontinued        200 mg 105 mL/hr over 60 Minutes Intravenous Every 24 hours 02/15/11 1806 02/17/11 1040   02/15/11 1800   ampicillin (OMNIPEN) 1 g in sodium chloride 0.9 % 50 mL IVPB        1 g 150 mL/hr over 20 Minutes Intravenous Every 6 hours 02/15/11 1658 02/16/11 1220   02/15/11 1153   ampicillin (OMNIPEN) 2 g in sodium chloride 0.9 % 50 mL IVPB        2 g 150 mL/hr over 20 Minutes Intravenous 30 min pre-op 02/15/11 1153 02/15/11 1405   02/15/11 1153   gentamicin (GARAMYCIN) 120 mg in dextrose 5 % 50 mL IVPB        120 mg 106 mL/hr over 30 Minutes Intravenous 30 min pre-op 02/15/11 1153 02/15/11 1414          Assessment/Plan: 1 - Hematuria / Radiation Cystitis - Resolving clinically post-op and now w/o catheter. Hgb down this am. Will transfuse 2 units and check h/h after as h/o  cerebrovascular disease. Again, this is likely equilibration as no active bleeding clinically.  2 - Agitation - improved, continue current regimen.  3 - Disposition - Possible DC this PM vs. Tomorrow if urine remains clear and H/H stable.  LOS: 6 days    [facility], Genifer Lazenby 02/21/2011
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
## 2011-02-22 ENCOUNTER — Other Ambulatory Visit: Payer: Self-pay

## 2011-02-22 LAB — TYPE AND SCREEN
Antibody Screen: NEGATIVE
Unit division: 0

## 2011-02-22 LAB — CBC
MCH: 30.8 pg (ref 26.0–34.0)
MCHC: 34.9 g/dL (ref 30.0–36.0)
Platelets: 280 10*3/uL (ref 150–400)
RDW: 13.8 % (ref 11.5–15.5)

## 2011-02-22 LAB — DIFFERENTIAL
Basophils Absolute: 0.1 10*3/uL (ref 0.0–0.1)
Eosinophils Relative: 3 % (ref 0–5)
Lymphocytes Relative: 10 % — ABNORMAL LOW (ref 12–46)
Lymphs Abs: 0.9 10*3/uL (ref 0.7–4.0)
Neutro Abs: 7 10*3/uL (ref 1.7–7.7)
Neutrophils Relative %: 77 % (ref 43–77)

## 2011-02-22 MED ORDER — LORAZEPAM 2 MG/ML IJ SOLN
1.0000 mg | Freq: Four times a day (QID) | INTRAMUSCULAR | Status: DC | PRN
  Administered 2011-02-25: 1 mg via INTRAVENOUS
  Filled 2011-02-22: qty 1

## 2011-02-22 MED ORDER — HALOPERIDOL LACTATE 5 MG/ML IJ SOLN
2.0000 mg | Freq: Four times a day (QID) | INTRAMUSCULAR | Status: DC | PRN
  Administered 2011-02-24 – 2011-02-25 (×3): 2 mg via INTRAVENOUS
  Filled 2011-02-22 (×3): qty 1

## 2011-02-22 MED ORDER — METOPROLOL SUCCINATE ER 50 MG PO TB24
50.0000 mg | ORAL_TABLET | Freq: Every day | ORAL | Status: DC
  Administered 2011-02-23 – 2011-02-25 (×3): 50 mg via ORAL
  Filled 2011-02-22 (×4): qty 1

## 2011-02-22 MED ORDER — SODIUM CHLORIDE 0.9 % IV SOLN
INTRAVENOUS | Status: DC
  Administered 2011-02-22 – 2011-02-24 (×3): via INTRAVENOUS
  Administered 2011-02-25: 50 mL/h via INTRAVENOUS

## 2011-02-22 MED ORDER — LORAZEPAM 1 MG PO TABS
1.0000 mg | ORAL_TABLET | Freq: Four times a day (QID) | ORAL | Status: DC | PRN
  Administered 2011-02-23 – 2011-02-24 (×2): 1 mg via ORAL
  Filled 2011-02-22 (×2): qty 1

## 2011-02-22 NOTE — Plan of Care (Signed)
Spoke with [facility] regarding Dr Patsi Sears request to see patient. Hospitalist stated that since it has been 4 days since they have signed off Dr Patsi Sears has to reconsult. Spoke with Selena Batten at Dr Patsi Sears office who said she would let him know he needed to call the consult in again. [name] A

## 2011-02-22 NOTE — Progress Notes (Signed)
Urology Progress Note  7 Days Post-Op cysto and cauterization of XRT cystitis arterial trigonal bleed, followed by ETOH DTs, and recurrent hematuria. Full med evaluation otherwise negative per [facility]. Pt had clear urine with condom cath overnight, buyt has become confused again and is again in restraints today. He is post transfusion last night. No itching, rash, or evidence of reaction.  Subjective:     No acute urologic events overnight. Ambulation:restraints in bed Flatus: yes   Bowel movement yes}  Pain: none  Objective:  Blood pressure 197/85, pulse 95, temperature 97.9 F (36.6 C), temperature source Oral, resp. rate 20, height 5\' 6"  (1.676 m), weight 78.472 kg (173 lb), SpO2 94.00%.  Physical Exam:  General:  Confused, in restraints, awake, but not alert  Genitourinary: condom cath in place. No hematuria. Foley:condom cath. Clear urine    I/O last 3 completed shifts: In: 3053.8 [P.O.:960; I.V.:1388.8; Blood:705] Out: 2475 [Urine:2475]  Recent Labs  [facility] 02/22/11 0413 02/21/11 2315   HGB 12.4* 11.9*   WBC 8.8 9.6   PLT 280 247    No results found for this basename: NA:2,K:2,CL:2,CO2:2,BUN:2,CREATININE:2,CALCIUM:2,MAGNESIUM:2,GFRNONAA:2,GFRAA:2 in the last 72 hours   No results found for this basename: PT:2,INR:2,APTT:2 in the last 72 hours   No components found with this basename: ABG:2  Assessment/Plan:  Continue any current medications. Needs O2 sat, and repeat [facility] evaluation. ? Neurology consult. ? Nursing Home placement. Wife not available to discuss.

## 2011-02-22 NOTE — Plan of Care (Signed)
Spoke with Dr. Patsi Sears regarding pt plan of care. Pt became very agitated, combative and confused last night requiring restraints. MD requests Medicine to come reevaluate patient. [facility] Team 3 (Dr. Brien Few) paged. Order placed for social work to come assist with potential d/c to SNF. Londyn Wotton A CN

## 2011-02-22 NOTE — Progress Notes (Signed)
CSW met with pt's spouse to assist with d/c planning. Pt presently confused, restless, in restraints. Spouse would like to take pt home once pt returns to baseline or becomes more manageble. Spouse is willing to consider SNF placement. CSW will initiate SNF search and provide bed offers as received. Pt will need to be out of restraints / no sitter for 24 hrs prior to d/c to SNF.

## 2011-02-22 NOTE — Progress Notes (Signed)
Message left last night to notify wife of restraints. No return call at this time.
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
## 2011-02-22 NOTE — Progress Notes (Signed)
Urology Progress Note  7 Days Post-Op  Pt is "wild" again today. Confused today.  Subjective:     No acute urologic events overnight. Ambulation: restrained   Flatus: yes    Bowel movement: last BM Feb 2   Pain: complete resolution  Objective:  Blood pressure 169/92, pulse 91, temperature 98 F (36.7 C), temperature source Axillary, resp. rate 20, height 5\' 6"  (1.676 m), weight 78.472 kg (173 lb), SpO2 97.00%.  Physical Exam:  General:  No acute distress, awake Resp: normal percussion bilaterally Genitourinary:  Neg.  Foley: Pt pulled condom cath out.     I/O last 3 completed shifts: In: 2345 [P.O.:1140; I.V.:500; Blood:705] Out: 2650 [Urine:2650]  Recent Labs  [facility] 02/22/11 0413 02/21/11 2315   HGB 12.4* 11.9*   WBC 8.8 9.6   PLT 280 247    No results found for this basename: NA:2,K:2,CL:2,CO2:2,BUN:2,CREATININE:2,CALCIUM:2,MAGNESIUM:2,GFRNONAA:2,GFRAA:2 in the last 72 hours   No results found for this basename: PT:2,INR:2,APTT:2 in the last 72 hours   No components found with this basename: ABG:2  Assessment/Plan: Pt had DTs last week, and now is confused again. Hct 35, 02 sat 100%. BP normal. Pt needs to be on medical service for further evaluation and possible SNF placement. I have discussed this the nurses, and he will need to be off restraints.   Wife tried to bring him beer tonight, because she notes that he drinks beer all the time, and that helps him..  Continue any current medications. Pt has no IV ( pulled pulled, and will not eat or drink anything except 1 beer given at 1 today).

## 2011-02-23 LAB — COMPREHENSIVE METABOLIC PANEL
Alkaline Phosphatase: 112 U/L (ref 39–117)
BUN: 14 mg/dL (ref 6–23)
CO2: 20 mEq/L (ref 19–32)
GFR calc Af Amer: 71 mL/min — ABNORMAL LOW (ref >90)
GFR calc non Af Amer: 61 mL/min — ABNORMAL LOW (ref >90)
Glucose, Bld: 95 mg/dL (ref 70–99)
Potassium: 3.4 mEq/L — ABNORMAL LOW (ref 3.5–5.1)
Total Protein: 7.7 g/dL (ref 6.0–8.3)

## 2011-02-23 LAB — URINALYSIS, ROUTINE W REFLEX MICROSCOPIC
Bilirubin Urine: NEGATIVE
Glucose, UA: NEGATIVE mg/dL
Hgb urine dipstick: NEGATIVE
Ketones, ur: 15 mg/dL — AB
Protein, ur: 100 mg/dL — AB
Urobilinogen, UA: 0.2 mg/dL (ref 0.0–1.0)

## 2011-02-23 LAB — CBC
HCT: 37.9 % — ABNORMAL LOW (ref 39.0–52.0)
Hemoglobin: 13 g/dL (ref 13.0–17.0)
MCH: 30.5 pg (ref 26.0–34.0)
MCHC: 34.3 g/dL (ref 30.0–36.0)
RBC: 4.26 MIL/uL (ref 4.22–5.81)

## 2011-02-23 LAB — URINE MICROSCOPIC-ADD ON

## 2011-02-23 LAB — AMMONIA: Ammonia: 25 umol/L (ref 11–60)

## 2011-02-23 NOTE — Progress Notes (Signed)
CSW spoke with patient's wife, [name] (home#: [phone] cell#: [phone] @ bedside) at bedside re: discharge planning. Noted patient is no longer in restraints, sitting up in chair eating lunch but currently has a Comptroller. Wife stated she plans to take patient home tomorrow afternoon if he continues to progress well & cognition stays the same as he is today. RNCM made aware.   Unice Bailey, LCSWA [phone]
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
## 2011-02-23 NOTE — Progress Notes (Signed)
Subjective: Very calm, can carry conversation without difficulty. Painting portraits.  Objective: Vital signs in last 24 hours: Temp:  [97.4 F (36.3 C)-98.2 F (36.8 C)] 98.2 F (36.8 C) (02/05 1429) Pulse Rate:  [67-85] 67  (02/05 1429) Resp:  [18-20] 18  (02/05 1429) BP: (111-210)/(66-115) 111/66 mmHg (02/05 1429) SpO2:  [95 %-97 %] 97 % (02/05 1429) Weight change:  Last BM Date: 02/20/11  Intake/Output from previous day: 02/04 0701 - 02/05 0700 In: 480 [P.O.:480] Out: 1400 [Urine:1400] Total I/O In: 720 [P.O.:720] Out: -    Physical Exam: General: Alert, awake, in no acute distress. HEENT: No bruits, no goiter. Heart: Regular rate and rhythm, without murmurs, rubs, gallops. Lungs: Clear to auscultation bilaterally. Abdomen: Soft, nontender, nondistended, positive bowel sounds. Extremities: No clubbing cyanosis or edema with positive pedal pulses.    Lab Results: Basic Metabolic Panel:  Basename 02/22/11 2324  NA 136  K 3.4*  CL 100  CO2 20  GLUCOSE 95  BUN 14  CREATININE 1.09  CALCIUM 9.2  MG --  PHOS --   Liver Function Tests:  Basename 02/22/11 2324  AST 31  ALT 11  ALKPHOS 112  BILITOT 0.8  PROT 7.7  ALBUMIN 3.2*    Basename 02/22/11 2324  AMMONIA 25   CBC:  Basename 02/23/11 0400 02/22/11 0413  WBC 7.7 8.8  NEUTROABS -- 7.0  HGB 13.0 12.4*  HCT 37.9* 35.5*  MCV 89.0 88.1  PLT 312 280   Anemia Panel:  Basename 02/22/11 2324  VITAMINB12 1119*  FOLATE --  FERRITIN --  TIBC --  IRON --  RETICCTPCT --   Urinalysis:  Basename 02/23/11 0040  COLORURINE YELLOW  LABSPEC 1.012  PHURINE 6.5  GLUCOSEU NEGATIVE  HGBUR NEGATIVE  BILIRUBINUR NEGATIVE  KETONESUR 15*  PROTEINUR 100*  UROBILINOGEN 0.2  NITRITE NEGATIVE  LEUKOCYTESUR NEGATIVE    Recent Results (from the past 240 hour(s))  SURGICAL PCR SCREEN     Status: Normal   Collection Time   02/15/11 12:18 PM      Component Value Range Status Comment   MRSA, PCR  NEGATIVE  NEGATIVE  Final    Staphylococcus aureus NEGATIVE  NEGATIVE  Final     Studies/Results: No results found.  Medications: Scheduled Meds:   . cloNIDine  0.2 mg Transdermal Weekly  . docusate sodium  100 mg Oral BID  . folic acid  1 mg Oral Daily  . geriatric multivitamins-minerals  15 mL Oral Daily  . hydrALAZINE  10 mg Intravenous Once  . metoprolol succinate  50 mg Oral Daily  . pantoprazole  40 mg Oral Q1200  . thiamine  100 mg Oral Daily   Or  . thiamine  100 mg Intravenous Daily  . DISCONTD: carboprost (HEMABATE) bladder irrigation - continuous   Bladder Irrigation Daily  . DISCONTD: metoprolol succinate  25 mg Oral Daily   Continuous Infusions:   . sodium chloride 50 mL/hr at 02/22/11 2316  . sodium chloride irrigation 3,000 mL (02/18/11 2039)  . DISCONTD: dextrose 5 % and 0.45 % NaCl with KCl 10 mEq/L Stopped (02/21/11 1900)   PRN Meds:.acetaminophen, haloperidol lactate, hydrALAZINE, hyoscyamine, LORazepam, LORazepam, ondansetron, zolpidem, DISCONTD: haloperidol lactate, DISCONTD: HYDROcodone-acetaminophen, DISCONTD: LORazepam, DISCONTD: LORazepam, DISCONTD: LORazepam, DISCONTD: morphine, DISCONTD: spiritus frumenti  Assessment/Plan:  1. Altered mental status/encephalopathy: Probably multifactorial secondary to underlying dementia which is complicated by hospitalization, acute medical illness, sundowning and alcohol withdrawal. No overt evidence of infections. No focal neurological signs. Patient has not required much  opioid pain medications. Will minimize factors that would aggravate his confusion such as opioid pain medications, avoid restraints as much as possible, keep the room darkened and quiet. Continue multivitamins and Ativan protocol. We'll use Haldol when necessary at low doses if Ativan is ineffective. We'll also send urine for analysis and culture. We'll check B12 levels. Is much improved today. 2. Mild dehydration: Encourage oral fluid intake and  consider brief IV fluids if patient allows. 3. Hypertension: We'll increase metoprolol and continue clonidine patch. Based on his renal panel, consider resuming his home dose of ARB. 4. Acute blood loss anemia: Status post transfusions and stable. Follow daily CBC. 5. History of coronary artery bypass graft/aortic tissue valve replacement: Patient's aspirin and Plavix have been held secondary to recent hematuria. Discuss with the urology service egarding resuming the same since his hematuria seems to have improved significantly or even resolved. 6. Alcohol dependence: Continue multivitamins and Ativan protocol. 7. Disposition:Wife wants to take patient home in the am. He has been very calm today.   UROLOGY: PLEASE STATE IF OK TO RESTART ASA AND PLAVIX PRIOR TO DC FROM YOUR STANDPOINT!      LOS: 8 days   HERNANDEZ ACOSTA,ESTELA [facility] Pager: [phone] 02/23/2011, 2:55 PM

## 2011-02-23 NOTE — Progress Notes (Signed)
Urology Progress Note  8 Days Post-Op   Subjective: Pt improved with medicine evaluation, and Rx including IV fluids. He is sleeping again this AM, but is less restless, with clear urine. Restraints are off.     No acute urologic events overnight. Ambulation: no   negative Flatus: yes   positive Bowel movement None today  negative  Pain: No pain  Objective:  Blood pressure 177/95, pulse 85, temperature 97.5 F (36.4 C), temperature source Oral, resp. rate 18, height 5\' 6"  (1.676 m), weight 78.472 kg (173 lb), SpO2 96.00%.  Physical Exam:  General:  No acute distress, awake Resp: normal percussion bilaterally Genitourinary:  urine clear Foley: condom cath    I/O last 3 completed shifts: In: 985 [P.O.:580; Blood:405] Out: 1500 [Urine:1500]  Recent Labs  Basename 02/23/11 0400 02/22/11 0413   HGB 13.0 12.4*   WBC 7.7 8.8   PLT 312 280    Recent Labs  Basename 02/22/11 2324   NA 136   K 3.4*   CL 100   CO2 20   BUN 14   CREATININE 1.09   CALCIUM 9.2   GFRNONAA 61*   GFRAA 71*     No results found for this basename: PT:2,INR:2,APTT:2 in the last 72 hours   No components found with this basename: ABG:2  Assessment/Plan: Pt urologically stable. He will be ready for SNF or D/c per medicine.

## 2011-02-23 NOTE — Evaluation (Addendum)
Physical Therapy Evaluation Patient Details Name: EULIS SALAZAR MRN: [id number] DOB: 18-Jun-1926 Today's Date: 02/23/2011  Problem List:  Patient Active Problem List  Diagnoses  . ADENOCARCINOMA, PROSTATE  . B12 DEFICIENCY  . HYPERLIPIDEMIA  . HYPERTENSION  . CORONARY ARTERY DISEASE  . Aortic valve disorders  . CAROTID ARTERY DISEASE  . TRANSIENT ISCHEMIC ATTACK  . PERIPHERAL VASCULAR DISEASE  . HEMORRHOIDS  . ALLERGIC RHINITIS  . GERD  . PEPTIC ULCER DISEASE  . HIATAL HERNIA  . DIVERTICULOSIS, COLON  . BENIGN PROSTATIC HYPERTROPHY  . OSTEOPENIA  . PROSTATE CANCER, HX OF  . Personal history of alcoholism  . CEREBROVASCULAR ACCIDENT, HX OF  . PUD, HX OF  . COLONIC POLYPS, ADENOMATOUS, HX OF  . Anemia  . Depression  . AS (aortic stenosis)  . S/P AVR (aortic valve replacement)  . S/P CABG x 1  . Confusion    Past Medical History:  Past Medical History  Diagnosis Date  . ADENOCARCINOMA, PROSTATE   . ALLERGIC RHINITIS   . B12 DEFICIENCY   . BENIGN PROSTATIC HYPERTROPHY   . CAROTID ARTERY DISEASE   . CEREBROVASCULAR ACCIDENT, HX OF   . CORONARY ARTERY DISEASE   . DIVERTICULOSIS, COLON   . GERD   . HEMORRHOIDS   . HIATAL HERNIA   . HYPERLIPIDEMIA   . HYPERTENSION   . OSTEOPENIA   . PEPTIC ULCER DISEASE   . PERIPHERAL VASCULAR DISEASE   . Personal history of alcoholism   . PROSTATE CANCER, HX OF   . PUD, HX OF   . TRANSIENT ISCHEMIC ATTACK   . Shingles   . Anemia   . AS (aortic stenosis)   . S/P AVR (aortic valve replacement) 09/23/2010  . Complication of anesthesia 09/23/2010    very confused after waking up. Stopped drinking 40 days before this surgery.   Past Surgical History:  Past Surgical History  Procedure Date  . Popliteal synovial cyst excision   . Right carotid endarterectomy 1999    Left carotid total chronic occlusion  . Prostate surgery   . Cardic stent 2004  . Aortic valve replacement 09/23/2010    #53mm [facility] Ease pericardial tissue  valve  . Coronary artery bypass graft 09/23/2010    CABG x1 using LIMA to LAD  . Cystoscopy with biopsy 02/15/2011    Procedure: CYSTOSCOPY WITH BIOPSY;  Surgeon: Anner Crete, MD;  Location: [facility];  Service: Urology;  Laterality: N/A;  Cystoscopy, biopsy and fulguration of the bladder    PT Assessment/Plan/Recommendation PT Assessment Clinical Impression Statement: Pt admitted for acute urinary retention and gross hematuria and appears pleasantly confused today.  Pt would benefit from acute PT services in order to improve safety with transfers and ambulation to prepare for d/c to next venue.  Pt demonstrating posterior LOB occasionally throughout eval , so will need 24/7 assist upon d/c.  Recommend SNF to improve balance, safety, and gait training. PT Recommendation/Assessment: Patient will need skilled PT in the acute care venue PT Problem List: Decreased balance;Decreased mobility;Decreased safety awareness;Decreased knowledge of use of DME;Decreased cognition PT Therapy Diagnosis : Difficulty walking PT Plan PT Frequency: Min 3X/week PT Treatment/Interventions: DME instruction;Gait training;Functional mobility training;Therapeutic activities;Therapeutic exercise;Balance training;Neuromuscular re-education;Patient/family education PT Recommendation Recommendations for Other Services:  (seen with OT) Follow Up Recommendations: Supervision/Assistance - 24 hour;Skilled nursing facility Equipment Recommended: Defer to next venue PT Goals  Acute Rehab PT Goals PT Goal Formulation: With patient Time For Goal Achievement: 7 days Pt will go  Sit to Stand: with supervision PT Goal: Sit to Stand - Progress: Goal set today Pt will go Stand to Sit: with supervision PT Goal: Stand to Sit - Progress: Goal set today Pt will Ambulate: >150 feet;with supervision;with least restrictive assistive device PT Goal: Ambulate - Progress: Goal set today Pt will Perform Home Exercise Program: with supervision,  verbal cues required/provided PT Goal: Perform Home Exercise Program - Progress: Goal set today  PT Evaluation Precautions/Restrictions  Precautions Precautions: Fall Prior Functioning  Home Living Lives With: Spouse Type of Home: House Home Layout: One level Home Access: Level entry Home Adaptive Equipment: None Prior Function Level of Independence: Independent with basic ADLs;Independent with gait Cognition Cognition Arousal/Alertness: Awake/alert Overall Cognitive Status: No family/caregiver present to determine baseline cognitive functioning Orientation Level: Disoriented to place;Disoriented to time;Disoriented to situation;Oriented to person Sensation/Coordination   Extremity Assessment RUE Assessment RUE Assessment: Within Functional Limits LUE Assessment LUE Assessment: Within Functional Limits RLE Assessment RLE Assessment: Within Functional Limits (per functional observation) LLE Assessment LLE Assessment: Within Functional Limits (per functional observation) Mobility (including Balance) Bed Mobility Bed Mobility: Yes Supine to Sit: 5: Supervision Supine to Sit Details (indicate cue type and reason): verbal cues for technique, increased time Sitting - Scoot to Edge of Bed: 5: Supervision Sitting - Scoot to Edge of Bed Details (indicate cue type and reason): verbal cues for bringing feet to floor Transfers Transfers: Yes Sit to Stand: 4: Min assist;From bed;With upper extremity assist Sit to Stand Details (indicate cue type and reason): min/guard, pushed up from RW prior to verbal cue for hand placement Stand to Sit: 4: Min assist;With armrests;To chair/3-in-1 Stand to Sit Details: min/guard, verbal cue for armrests Ambulation/Gait Ambulation/Gait: Yes Ambulation/Gait Assistance: 4: Min assist Ambulation/Gait Assistance Details (indicate cue type and reason): min A for occasional posterior LOB, verbal cues for using RW safely Ambulation Distance (Feet): 120  Feet Assistive device: Rolling walker Gait Pattern: Step-through pattern;Trunk flexed  Static Standing Balance Static Standing - Balance Support: During functional activity;No upper extremity supported (drying hands) Static Standing - Level of Assistance: 4: Min assist Static Standing - Comment/# of Minutes: pt using drying bilateral hands with paper towel and demonstrated posterior LOB with minA to correct. Exercise    End of Session PT - End of Session Activity Tolerance: Patient tolerated treatment well Patient left: in chair;with call bell in reach (with sitter) General Behavior During Session: [facility] for tasks performed Cognition: Impaired Cognitive Impairment: pleasantly confused  Blakely Maranan,KATHrine E 02/23/2011, 11:04 AM Pager: [phone]

## 2011-02-23 NOTE — Progress Notes (Signed)
02-23-11 Spoke with Mr and Mrs Vandevoort at bedside to discuss dc planning. Mrs Batdorf wants to take her husband home instead of going to snf. Offered home health agency list for at least [facility] PT, as wife was reluctant to even accept PT services at home. After reiterating concern for patient's safety and her ability to care for him at home, Mrs Perezperez pleasantly insists that the plan will be home. Mrs Reppert states that she think her husband will be less confused in his own environment. Chose [facility] for [facility] services, PT and SW in case she will need to place patient once he is at home.  Gerre Scull Hall,RN,BSN,CM [phone]

## 2011-02-23 NOTE — Progress Notes (Signed)
02-23-11 [facility] unable to service patient for hhc needs. [facility] intake states they will not be able to have PT come out to patient's home until 03-04-11. Therefore, [facility] was set up for PT/SW services. Made Norberta Keens aware of referral with [facility] and the likelihood of Mr [facility] discharge tomorrow. Left vm for Mrs Delbuono regarding hhc arrangements. Of note, Mrs Coble reports patient not needing rolling walker at home. States they have access to rolling walkers at home.  [place], [place] [phone]

## 2011-02-24 LAB — CBC
MCH: 30.7 pg (ref 26.0–34.0)
MCHC: 34.3 g/dL (ref 30.0–36.0)
MCV: 89.6 fL (ref 78.0–100.0)
Platelets: 274 10*3/uL (ref 150–400)

## 2011-02-24 NOTE — Progress Notes (Signed)
Subjective: Wants to go home.   Objective: Weight change:   Intake/Output Summary (Last 24 hours) at 02/24/11 1655 Last data filed at 02/24/11 1300  Gross per 24 hour  Intake   1640 ml  Output    400 ml  Net   1240 ml    Filed Vitals:   02/24/11 1600  BP: 139/76  Pulse: 61  Temp:   Resp: 16  Physical Exam:  General: Alert, awake, in no acute distress.  HEENT: No bruits, no goiter.  Heart: Regular rate and rhythm, without murmurs, rubs, gallops.  Lungs: Clear to auscultation bilaterally.  Abdomen: Soft, nontender, nondistended, positive bowel sounds.  Extremities: No clubbing cyanosis or edema with positive pedal pulses.   Lab Results: Results for orders placed during the hospital encounter of 02/15/11 (from the past 24 hour(s))  CBC     Status: Abnormal   Collection Time   02/24/11  4:15 AM      Component Value Range   WBC 6.3  4.0 - 10.5 (K/uL)   RBC 3.74 (*) 4.22 - 5.81 (MIL/uL)   Hemoglobin 11.5 (*) 13.0 - 17.0 (g/dL)   HCT 16.1 (*) 09.6 - 52.0 (%)   MCV 89.6  78.0 - 100.0 (fL)   MCH 30.7  26.0 - 34.0 (pg)   MCHC 34.3  30.0 - 36.0 (g/dL)   RDW 04.5  40.9 - 81.1 (%)   Platelets 274  150 - 400 (K/uL)     Micro Results: Recent Results (from the past 240 hour(s))  SURGICAL PCR SCREEN     Status: Normal   Collection Time   02/15/11 12:18 PM      Component Value Range Status Comment   MRSA, PCR NEGATIVE  NEGATIVE  Final    Staphylococcus aureus NEGATIVE  NEGATIVE  Final   URINE CULTURE     Status: Normal (Preliminary result)   Collection Time   02/23/11 12:40 AM      Component Value Range Status Comment   Specimen Description URINE, RANDOM   Final    Special Requests NONE   Final    Culture  Setup Time [id number]   Final    Colony Count PENDING   Incomplete    Culture Culture reincubated for better growth   Final    Report Status PENDING   Incomplete     Studies/Results: Dg Chest Port 1 View  02/17/2011  *RADIOLOGY REPORT*  Clinical Data: Hypoxia.   Shortness of breath.  Aortic valve replacement.  Hypertension.  PORTABLE CHEST - 1 VIEW  Comparison: 11/02/2010  Findings: Aortic valve prosthesis noted with increased accentuation of the interstitium.  Low lung volumes are noted. There is confluent opacity in the left lower lobe in the retrocardiac position.  Atherosclerotic calcification of the aortic arch is present.  IMPRESSION:  1.  Abnormal increase in the interstitial accentuation, query noncardiogenic edema or atypical pneumonia. This is somewhat confluent in the left lower lobe.  Follow-up to clearance is recommended.  Original Report Authenticated By: Dellia Cloud, M.D.   Medications: Scheduled Meds:   . cloNIDine  0.2 mg Transdermal Weekly  . docusate sodium  100 mg Oral BID  . folic acid  1 mg Oral Daily  . geriatric multivitamins-minerals  15 mL Oral Daily  . hydrALAZINE  10 mg Intravenous Once  . metoprolol succinate  50 mg Oral Daily  . pantoprazole  40 mg Oral Q1200  . thiamine  100 mg Oral Daily   Or  . thiamine  100 mg Intravenous Daily   Continuous Infusions:   . sodium chloride 50 mL/hr at 02/24/11 1615  . sodium chloride irrigation 3,000 mL (02/18/11 2039)   PRN Meds:.acetaminophen, haloperidol lactate, hydrALAZINE, hyoscyamine, LORazepam, LORazepam, ondansetron, zolpidem  Assessment/Plan: Patient Active Hospital Problem List: No active hospital problems. 1. Altered mental status/encephalopathy:  Resolved. Probably multifactorial secondary to underlying dementia which is complicated by hospitalization, acute medical illness, sundowning and alcohol withdrawal. Urine cultures are pending. . No focal neurological signs. Patient has not required much opioid pain medications. Will minimize factors that would aggravate his confusion such as opioid pain medications, avoid restraints as much as possible, keep the room darkened and quiet. Continue multivitamins and Ativan protocol. We'll use Haldol when necessary at low  doses if Ativan is ineffective. We'll also send urine for analysis and culture. We'll check B12 levels. Is much improved today. 2. Mild dehydration: Encourage oral fluid intake and consider brief IV fluids if patient allows. 3. Hypertension: We'll increase metoprolol and continue clonidine patch. Based on his renal panel, consider resuming his home dose of ARB. 4. Acute blood loss anemia: Status post transfusions and stable. Follow daily CBC. 5. History of coronary artery bypass graft/aortic tissue valve replacement: Patient's aspirin and Plavix have been held secondary to recent hematuria. Discuss with the urology service egarding resuming the same since his hematuria seems to have improved significantly or even resolved. 6. Alcohol dependence: Continue multivitamins and Ativan protocol. 7. Disposition: d/c in am.      LOS: 9 days   [name] 02/24/2011, 4:55 PM

## 2011-02-24 NOTE — Progress Notes (Signed)
Patient discussed at the Long Length of Stay [name] Weeks 02/24/2011  

## 2011-02-24 NOTE — Progress Notes (Signed)
Physical Therapy Treatment Patient Details Name: [name] MRN: [id number] DOB: 09/23/1926 Today's Date: 02/24/2011  PT Assessment/Plan  PT - Assessment/Plan Comments on Treatment Session: Pt did better with ambulation today with RW.  Pt plans for d/c home today with wife.  Pt agreeable to use RW upon return home.  Since pt d/cing home, would benefit from HHPT and 24/7 supervision. PT Plan: Discharge plan needs to be updated Follow Up Recommendations: Home health PT;Supervision/Assistance - 24 hour Equipment Recommended: Rolling walker with 5" wheels (pt unsure if he owns one) PT Goals  Acute Rehab PT Goals PT Goal: Sit to Stand - Progress: Progressing toward goal PT Goal: Stand to Sit - Progress: Progressing toward goal PT Goal: Ambulate - Progress: Progressing toward goal  PT Treatment Precautions/Restrictions  Precautions Precautions: Fall Restrictions Weight Bearing Restrictions: No Mobility (including Balance) Bed Mobility Bed Mobility: Yes Supine to Sit: 5: Supervision Supine to Sit Details (indicate cue type and reason): for safety (pt just awoke) Transfers Transfers: Yes Sit to Stand: 4: Min assist Sit to Stand Details (indicate cue type and reason): 2 attempts to rise, min/guard, verbal cue for hand placement Stand to Sit: 4: Min assist;With armrests;To chair/3-in-1 Stand to Sit Details: min/guard, verbal cues to use armrests Ambulation/Gait Ambulation/Gait: Yes Ambulation/Gait Assistance: 4: Min assist Ambulation/Gait Assistance Details (indicate cue type and reason): min/guard, no LOB observed today, verbal cues for avoiding objects on L side and safe RW distance Ambulation Distance (Feet): 280 Feet Assistive device: Rolling walker Gait Pattern: Step-through pattern;Trunk flexed    Exercise    End of Session PT - End of Session Equipment Utilized During Treatment: Gait belt Activity Tolerance: Patient tolerated treatment well Patient left: in chair;with  call bell in reach (with sitter) General Behavior During Session: [facility] for tasks performed Cognition: [facility] for tasks performed  Emanuel Dowson,KATHrine E 02/24/2011, 11:45 AM Pager: [phone]

## 2011-02-25 DIAGNOSIS — R319 Hematuria, unspecified: Secondary | ICD-10-CM | POA: Diagnosis present

## 2011-02-25 DIAGNOSIS — I10 Essential (primary) hypertension: Secondary | ICD-10-CM | POA: Diagnosis present

## 2011-02-25 DIAGNOSIS — N390 Urinary tract infection, site not specified: Secondary | ICD-10-CM | POA: Diagnosis present

## 2011-02-25 LAB — URINE CULTURE
Colony Count: >=100000
Culture  Setup Time: 201302050502

## 2011-02-25 MED ORDER — FOLIC ACID 1 MG PO TABS: 1.0000 mg | ORAL_TABLET | Freq: Every day | ORAL | Status: DC

## 2011-02-25 MED ORDER — CLONIDINE HCL 0.2 MG/24HR TD PTWK: 1.0000 | MEDICATED_PATCH | TRANSDERMAL | Status: DC

## 2011-02-25 MED ORDER — NITROFURANTOIN MONOHYD MACRO 100 MG PO CAPS: 100.0000 mg | ORAL_CAPSULE | Freq: Two times a day (BID) | ORAL | Status: AC

## 2011-02-25 NOTE — Discharge Summary (Signed)
DISCHARGE SUMMARY  [name]  MR#: [id number]  DOB:06-12-1926  Date of Admission: 02/15/2011 Date of Discharge: 02/25/2011  Attending Physician:[name]  Patient's WUJ:WJXBJ [name] Ruiz, MD, MD  Consults:Treatment Team:  [name] Scott, MD Urology.  Discharge Diagnoses: Present on Admission:  1. Hematuria 2. Uti 3. Hypertension 4. CAD 5. Anemia of blood loss.     Medication List  As of 02/25/2011  2:18 PM   TAKE these medications         alendronate 70 MG tablet   Commonly known as: FOSAMAX   Take 1 tablet (70 mg total) by mouth every 7 (seven) days. Take with a full glass of water on an empty stomach.               atorvastatin 20 MG tablet   Commonly known as: LIPITOR   Take 1 tablet (20 mg total) by mouth daily.      cloNIDine 0.2 mg/24hr patch   Commonly known as: CATAPRES - Dosed in mg/24 hr   Place 1 patch (0.2 mg total) onto the skin once a week.      clotrimazole-betamethasone cream   Commonly known as: LOTRISONE   Apply 1 application topically 2 (two) times daily as needed. For rash on/around buttocks.      cyanocobalamin 100 MCG tablet   Take 100 mcg by mouth daily.      folic acid 1 MG tablet   Commonly known as: FOLVITE   Take 1 tablet (1 mg total) by mouth daily.      metoprolol succinate 25 MG 24 hr tablet   Commonly known as: TOPROL-XL   Take 1 tablet (25 mg total) by mouth daily.      mulitivitamin with minerals Tabs   Take 1 tablet by mouth daily.      nitrofurantoin (macrocrystal-monohydrate) 100 MG capsule   Commonly known as: MACROBID   Take 1 capsule (100 mg total) by mouth 2 (two) times daily.      omeprazole 40 MG capsule   Commonly known as: PRILOSEC   Take 40 mg by mouth daily.      valsartan 80 MG tablet   Commonly known as: DIOVAN   Take 1 tablet (80 mg total) by mouth daily.             Hospital Course: 1. Present on Admission:  Altered mental status/encephalopathy: Resolved. Probably multifactorial secondary to  underlying dementia which is complicated by hospitalization, acute medical illness, sundowning and alcohol withdrawal. . No focal neurological signs. Patient has not required much opioid pain medications.  Urine cultures grew coagulase negative staphylococcus and Enterococcus, which are both sensitive to Macrobid. Pt is currently asymptomatic, but he is being discharged on a 7 day course of Macro bid and recommended to follow up with [name] in one week. .   2. Mild dehydration: Encourage oral fluid intake  3. Hypertension: We'll increase metoprolol and continue clonidine patch.  resuming his home dose of ARB. 4. Acute blood loss anemia: Status post transfusions and stable. Follow  CBC in one week.  5. History of coronary artery bypass graft/aortic tissue valve replacement: Patient's aspirin and Plavix have been held secondary to recent hematuria.Will continue to hold aspirin and plavix till the patient sees [name] or PCP in one week. Could not get in touch will [name] at his office or at this pager [phone]. Hence will continue holding the aspirin and plavix till he sees Urology.  6. Alcohol dependence: Continue multivitamins and  no episodes of withdrawal symptoms.   Day of Discharge BP 140/82  Pulse 64  Temp(Src) 98.4 F (36.9 C) (Oral)  Resp 18  Ht 5\' 6"  (1.676 m)  Wt 78.472 kg (173 lb)  BMI 27.92 kg/m2  SpO2 98%  Physical Exam:  Physical Exam:  General: Alert, awake, in no acute distress.  HEENT: No bruits, no goiter.  Heart: Regular rate and rhythm, without murmurs, rubs, gallops.  Lungs: Clear to auscultation bilaterally.  Abdomen: Soft, nontender, nondistended, positive bowel sounds.  Extremities: No clubbing cyanosis or edema with positive pedal pulses.     No results found for this or any previous visit (from the past 24 hour(s)).  Disposition: HOme   Follow-up Appts: Discharge Orders    Future Appointments: Provider: Department: Dept Phone: Center:    08/03/2011 1:00 PM [name] Barre, MD [facility] [phone] [facility]     Future Orders Please Complete By Expires   Diet - low sodium heart healthy      Increase activity slowly      Foley catheter - discontinue      Scheduling Instructions:   D/C IVnow, and D/C CBI now. D/C foley if urine clear at noon, and D/C post voiding trial.   Increase activity slowly      Discharge instructions      Comments:   Hold plavix till you see [name] in office in one week's time.   Discontinue IV         Follow-up Information    Follow up with TANNENBAUM, SIGMUND I, MD in 1 week. (call [phone] for appointment)    Contact information:   [address] [place], [address] [facility] [place] [address] [phone]          Tests Needing Follow-up: Cbc in one week.  Please check with PCP/ [name] whether to continue or hold aspirin and Plavix.   Time spent in discharge (includes decision making & examination of pt): 50 minutes  Signed: Kenna [name] 02/25/2011, 2:45 PM

## 2011-02-25 NOTE — Progress Notes (Signed)
Late entry: 02/22/11 @ approximately 10:30pm: Dr Waymon Amato to unit to see pt. He requested stat EKG, manual BP, a CIWA score, and new IV site placement, which was all completed while he was present on unit. He decided to remove restraints and not reorder for renewal. Restraints removed after new IV site started. Pt complained of a mild headache when he lifts his head while MD was at bedside. No other complaints voiced. Sitter present at bedside and bed alarm on. Will monitor.

## 2011-02-25 NOTE — Progress Notes (Signed)
Late entry: 02/22/11 @ approximately 8:30 pm: Dr. Patsi Sears to unit to see patient and spoke with myself and charge nurse in regards to pt care. He states that he is attempting to get Internal medicine involved in pt care. I paged on call NP to his extension. Dr. Patsi Sears states that he would leave restraint renewal up to the hospitalist doctors because he is not sure what the cause of his confusion is and he is currently urologically stable.

## 2011-04-05 DIAGNOSIS — N390 Urinary tract infection, site not specified: Secondary | ICD-10-CM

## 2011-04-05 DIAGNOSIS — N3040 Irradiation cystitis without hematuria: Secondary | ICD-10-CM

## 2011-04-05 DIAGNOSIS — R32 Unspecified urinary incontinence: Secondary | ICD-10-CM

## 2011-04-05 DIAGNOSIS — C61 Malignant neoplasm of prostate: Secondary | ICD-10-CM

## 2011-04-23 ENCOUNTER — Telehealth: Payer: Self-pay

## 2011-04-23 NOTE — Telephone Encounter (Signed)
Pt's wife called.  They are seeing Dr Patsi Sears this afternoon.  He is having a lot of bleeding with clots.  Having another catherter put in today, clots can't get through the one he has.  Dr Imelda Pillow office was to send a note.  Told the wife that if necessary, have Dr. Patsi Sears to contact Dr. [name].

## 2011-04-23 NOTE — Telephone Encounter (Signed)
I spoke on phone with Dr Patsi Sears  Pt with ongoing recurrent bleeding on Asa/plavix, required hospn with UTI several wks ago  I asked Dr T to let pt and wife know, ok to take the ASA 81 only at this time, but may consider re-start the plavix at later date given his heart disease  Robin to let wife know as well, and suggest OV soon

## 2011-04-23 NOTE — Telephone Encounter (Signed)
Patient has been informed to stop plavix and asa due to bleeding from radiation. Also was given folic acid in the hospital, but no refills. Please advise on medications, call back number is [phone]

## 2011-04-23 NOTE — Telephone Encounter (Signed)
Pt last d/c summary from jan 28 reviewed  Actually, the problem was UTI and hematuria (no mention of GI blood loss), in the setting of probable alcohol withdrawal  Ok to re-start the ASA, plavix if there has been no further bleeding   Needs OV to f/u anemia as well

## 2011-04-25 ENCOUNTER — Inpatient Hospital Stay (HOSPITAL_COMMUNITY)
Admission: EM | Admit: 2011-04-25 | Discharge: 2011-05-11 | DRG: 698 | Disposition: A | Discharge status: Skilled Nursing Facility | Payer: Medicare Other | Attending: Internal Medicine | Admitting: Internal Medicine

## 2011-04-25 ENCOUNTER — Encounter (HOSPITAL_COMMUNITY): Payer: Self-pay

## 2011-04-25 ENCOUNTER — Other Ambulatory Visit: Payer: Self-pay

## 2011-04-25 DIAGNOSIS — I350 Nonrheumatic aortic (valve) stenosis: Secondary | ICD-10-CM

## 2011-04-25 DIAGNOSIS — N189 Chronic kidney disease, unspecified: Secondary | ICD-10-CM | POA: Diagnosis present

## 2011-04-25 DIAGNOSIS — J309 Allergic rhinitis, unspecified: Secondary | ICD-10-CM

## 2011-04-25 DIAGNOSIS — F32A Depression, unspecified: Secondary | ICD-10-CM

## 2011-04-25 DIAGNOSIS — Z8546 Personal history of malignant neoplasm of prostate: Secondary | ICD-10-CM

## 2011-04-25 DIAGNOSIS — F29 Unspecified psychosis not due to a substance or known physiological condition: Secondary | ICD-10-CM | POA: Diagnosis present

## 2011-04-25 DIAGNOSIS — R339 Retention of urine, unspecified: Secondary | ICD-10-CM | POA: Diagnosis present

## 2011-04-25 DIAGNOSIS — Z79899 Other long term (current) drug therapy: Secondary | ICD-10-CM

## 2011-04-25 DIAGNOSIS — N289 Disorder of kidney and ureter, unspecified: Secondary | ICD-10-CM

## 2011-04-25 DIAGNOSIS — T458X5A Adverse effect of other primarily systemic and hematological agents, initial encounter: Secondary | ICD-10-CM | POA: Diagnosis present

## 2011-04-25 DIAGNOSIS — T66XXXS Radiation sickness, unspecified, sequela: Secondary | ICD-10-CM

## 2011-04-25 DIAGNOSIS — R319 Hematuria, unspecified: Secondary | ICD-10-CM | POA: Diagnosis present

## 2011-04-25 DIAGNOSIS — F1021 Alcohol dependence, in remission: Secondary | ICD-10-CM | POA: Diagnosis present

## 2011-04-25 DIAGNOSIS — I129 Hypertensive chronic kidney disease with stage 1 through stage 4 chronic kidney disease, or unspecified chronic kidney disease: Secondary | ICD-10-CM | POA: Diagnosis present

## 2011-04-25 DIAGNOSIS — K319 Disease of stomach and duodenum, unspecified: Secondary | ICD-10-CM | POA: Diagnosis present

## 2011-04-25 DIAGNOSIS — N390 Urinary tract infection, site not specified: Secondary | ICD-10-CM

## 2011-04-25 DIAGNOSIS — R9401 Abnormal electroencephalogram [EEG]: Secondary | ICD-10-CM | POA: Diagnosis not present

## 2011-04-25 DIAGNOSIS — Z8673 Personal history of transient ischemic attack (TIA), and cerebral infarction without residual deficits: Secondary | ICD-10-CM

## 2011-04-25 DIAGNOSIS — I5033 Acute on chronic diastolic (congestive) heart failure: Secondary | ICD-10-CM | POA: Diagnosis not present

## 2011-04-25 DIAGNOSIS — F1027 Alcohol dependence with alcohol-induced persisting dementia: Secondary | ICD-10-CM | POA: Diagnosis not present

## 2011-04-25 DIAGNOSIS — Y95 Nosocomial condition: Secondary | ICD-10-CM

## 2011-04-25 DIAGNOSIS — N179 Acute kidney failure, unspecified: Secondary | ICD-10-CM

## 2011-04-25 DIAGNOSIS — Y842 Radiological procedure and radiotherapy as the cause of abnormal reaction of the patient, or of later complication, without mention of misadventure at the time of the procedure: Secondary | ICD-10-CM | POA: Diagnosis present

## 2011-04-25 DIAGNOSIS — I472 Ventricular tachycardia, unspecified: Secondary | ICD-10-CM | POA: Diagnosis not present

## 2011-04-25 DIAGNOSIS — D62 Acute posthemorrhagic anemia: Secondary | ICD-10-CM

## 2011-04-25 DIAGNOSIS — I2510 Atherosclerotic heart disease of native coronary artery without angina pectoris: Secondary | ICD-10-CM

## 2011-04-25 DIAGNOSIS — Y849 Medical procedure, unspecified as the cause of abnormal reaction of the patient, or of later complication, without mention of misadventure at the time of the procedure: Secondary | ICD-10-CM

## 2011-04-25 DIAGNOSIS — R310 Gross hematuria: Secondary | ICD-10-CM | POA: Diagnosis present

## 2011-04-25 DIAGNOSIS — Z954 Presence of other heart-valve replacement: Secondary | ICD-10-CM

## 2011-04-25 DIAGNOSIS — Z8679 Personal history of other diseases of the circulatory system: Secondary | ICD-10-CM

## 2011-04-25 DIAGNOSIS — I739 Peripheral vascular disease, unspecified: Secondary | ICD-10-CM | POA: Diagnosis present

## 2011-04-25 DIAGNOSIS — E785 Hyperlipidemia, unspecified: Secondary | ICD-10-CM | POA: Diagnosis present

## 2011-04-25 DIAGNOSIS — K219 Gastro-esophageal reflux disease without esophagitis: Secondary | ICD-10-CM

## 2011-04-25 DIAGNOSIS — R64 Cachexia: Secondary | ICD-10-CM | POA: Diagnosis present

## 2011-04-25 DIAGNOSIS — J9600 Acute respiratory failure, unspecified whether with hypoxia or hypercapnia: Secondary | ICD-10-CM

## 2011-04-25 DIAGNOSIS — I509 Heart failure, unspecified: Secondary | ICD-10-CM | POA: Diagnosis not present

## 2011-04-25 DIAGNOSIS — A498 Other bacterial infections of unspecified site: Secondary | ICD-10-CM | POA: Diagnosis present

## 2011-04-25 DIAGNOSIS — Z7982 Long term (current) use of aspirin: Secondary | ICD-10-CM

## 2011-04-25 DIAGNOSIS — B9620 Unspecified Escherichia coli [E. coli] as the cause of diseases classified elsewhere: Secondary | ICD-10-CM | POA: Diagnosis not present

## 2011-04-25 DIAGNOSIS — J189 Pneumonia, unspecified organism: Secondary | ICD-10-CM | POA: Diagnosis not present

## 2011-04-25 DIAGNOSIS — F10239 Alcohol dependence with withdrawal, unspecified: Secondary | ICD-10-CM | POA: Diagnosis present

## 2011-04-25 DIAGNOSIS — F329 Major depressive disorder, single episode, unspecified: Secondary | ICD-10-CM

## 2011-04-25 DIAGNOSIS — K5730 Diverticulosis of large intestine without perforation or abscess without bleeding: Secondary | ICD-10-CM | POA: Diagnosis present

## 2011-04-25 DIAGNOSIS — J9601 Acute respiratory failure with hypoxia: Secondary | ICD-10-CM

## 2011-04-25 DIAGNOSIS — Z66 Do not resuscitate: Secondary | ICD-10-CM | POA: Diagnosis not present

## 2011-04-25 DIAGNOSIS — T8092XA Unspecified transfusion reaction, initial encounter: Secondary | ICD-10-CM

## 2011-04-25 DIAGNOSIS — K449 Diaphragmatic hernia without obstruction or gangrene: Secondary | ICD-10-CM | POA: Diagnosis present

## 2011-04-25 DIAGNOSIS — K560 Paralytic ileus: Secondary | ICD-10-CM

## 2011-04-25 DIAGNOSIS — Z952 Presence of prosthetic heart valve: Secondary | ICD-10-CM

## 2011-04-25 DIAGNOSIS — B029 Zoster without complications: Secondary | ICD-10-CM | POA: Diagnosis present

## 2011-04-25 DIAGNOSIS — I4891 Unspecified atrial fibrillation: Secondary | ICD-10-CM

## 2011-04-25 DIAGNOSIS — I10 Essential (primary) hypertension: Secondary | ICD-10-CM

## 2011-04-25 DIAGNOSIS — R5381 Other malaise: Secondary | ICD-10-CM | POA: Diagnosis not present

## 2011-04-25 DIAGNOSIS — D649 Anemia, unspecified: Secondary | ICD-10-CM

## 2011-04-25 DIAGNOSIS — F1020 Alcohol dependence, uncomplicated: Secondary | ICD-10-CM | POA: Diagnosis present

## 2011-04-25 DIAGNOSIS — C61 Malignant neoplasm of prostate: Secondary | ICD-10-CM

## 2011-04-25 DIAGNOSIS — N400 Enlarged prostate without lower urinary tract symptoms: Secondary | ICD-10-CM | POA: Diagnosis present

## 2011-04-25 DIAGNOSIS — Z781 Physical restraint status: Secondary | ICD-10-CM | POA: Diagnosis not present

## 2011-04-25 DIAGNOSIS — E43 Unspecified severe protein-calorie malnutrition: Secondary | ICD-10-CM

## 2011-04-25 DIAGNOSIS — J811 Chronic pulmonary edema: Secondary | ICD-10-CM

## 2011-04-25 DIAGNOSIS — I4729 Other ventricular tachycardia: Secondary | ICD-10-CM | POA: Diagnosis not present

## 2011-04-25 DIAGNOSIS — N3040 Irradiation cystitis without hematuria: Principal | ICD-10-CM | POA: Diagnosis present

## 2011-04-25 DIAGNOSIS — E870 Hyperosmolality and hypernatremia: Secondary | ICD-10-CM

## 2011-04-25 DIAGNOSIS — Z9861 Coronary angioplasty status: Secondary | ICD-10-CM

## 2011-04-25 DIAGNOSIS — Z951 Presence of aortocoronary bypass graft: Secondary | ICD-10-CM

## 2011-04-25 DIAGNOSIS — IMO0002 Reserved for concepts with insufficient information to code with codable children: Secondary | ICD-10-CM | POA: Diagnosis not present

## 2011-04-25 DIAGNOSIS — E876 Hypokalemia: Secondary | ICD-10-CM | POA: Diagnosis not present

## 2011-04-25 DIAGNOSIS — N3091 Cystitis, unspecified with hematuria: Secondary | ICD-10-CM

## 2011-04-25 DIAGNOSIS — E873 Alkalosis: Secondary | ICD-10-CM | POA: Diagnosis not present

## 2011-04-25 DIAGNOSIS — D72829 Elevated white blood cell count, unspecified: Secondary | ICD-10-CM | POA: Diagnosis present

## 2011-04-25 DIAGNOSIS — F10939 Alcohol use, unspecified with withdrawal, unspecified: Secondary | ICD-10-CM | POA: Diagnosis present

## 2011-04-25 DIAGNOSIS — I359 Nonrheumatic aortic valve disorder, unspecified: Secondary | ICD-10-CM

## 2011-04-25 DIAGNOSIS — R410 Disorientation, unspecified: Secondary | ICD-10-CM

## 2011-04-25 DIAGNOSIS — R5084 Febrile nonhemolytic transfusion reaction: Secondary | ICD-10-CM

## 2011-04-25 HISTORY — DX: Cystitis, unspecified with hematuria: N30.91

## 2011-04-25 HISTORY — DX: Febrile nonhemolytic transfusion reaction: R50.84

## 2011-04-25 LAB — URINALYSIS, ROUTINE W REFLEX MICROSCOPIC
Glucose, UA: NEGATIVE mg/dL
Ketones, ur: NEGATIVE mg/dL
pH: 7 (ref 5.0–8.0)

## 2011-04-25 LAB — BLOOD GAS, ARTERIAL
Drawn by: 310571
O2 Content: 2 L/min
pCO2 arterial: 29.5 mmHg — ABNORMAL LOW (ref 35.0–45.0)
pH, Arterial: 7.358 (ref 7.350–7.450)

## 2011-04-25 LAB — BASIC METABOLIC PANEL
Calcium: 8.2 mg/dL — ABNORMAL LOW (ref 8.4–10.5)
GFR calc non Af Amer: 46 mL/min — ABNORMAL LOW (ref >90)
GFR calc non Af Amer: 49 mL/min — ABNORMAL LOW (ref >90)
Glucose, Bld: 125 mg/dL — ABNORMAL HIGH (ref 70–99)
Glucose, Bld: 156 mg/dL — ABNORMAL HIGH (ref 70–99)
Potassium: 4.3 mEq/L (ref 3.5–5.1)
Potassium: 4.3 mEq/L (ref 3.5–5.1)
Sodium: 131 mEq/L — ABNORMAL LOW (ref 135–145)
Sodium: 134 mEq/L — ABNORMAL LOW (ref 135–145)

## 2011-04-25 LAB — APTT: aPTT: 30 seconds (ref 24–37)

## 2011-04-25 LAB — D-DIMER, QUANTITATIVE: D-Dimer, Quant: 1.94 ug/mL-FEU — ABNORMAL HIGH (ref 0.00–0.48)

## 2011-04-25 LAB — DIFFERENTIAL
Basophils Absolute: 0 10*3/uL (ref 0.0–0.1)
Eosinophils Absolute: 0.1 10*3/uL (ref 0.0–0.7)
Lymphocytes Relative: 6 % — ABNORMAL LOW (ref 12–46)
Lymphs Abs: 0.5 10*3/uL — ABNORMAL LOW (ref 0.7–4.0)
Neutrophils Relative %: 87 % — ABNORMAL HIGH (ref 43–77)

## 2011-04-25 LAB — POCT I-STAT, CHEM 8
BUN: 26 mg/dL — ABNORMAL HIGH (ref 6–23)
Creatinine, Ser: 1.5 mg/dL — ABNORMAL HIGH (ref 0.50–1.35)
Glucose, Bld: 127 mg/dL — ABNORMAL HIGH (ref 70–99)
Potassium: 4.3 mEq/L (ref 3.5–5.1)
Sodium: 136 mEq/L (ref 135–145)

## 2011-04-25 LAB — CBC
Hemoglobin: 7.2 g/dL — ABNORMAL LOW (ref 13.0–17.0)
MCH: 30.3 pg (ref 26.0–34.0)
MCHC: 33.6 g/dL (ref 30.0–36.0)
Platelets: 230 10*3/uL (ref 150–400)
RBC: 2.38 MIL/uL — ABNORMAL LOW (ref 4.22–5.81)
RBC: 2.77 MIL/uL — ABNORMAL LOW (ref 4.22–5.81)
RDW: 13.8 % (ref 11.5–15.5)
WBC: 10.6 10*3/uL — ABNORMAL HIGH (ref 4.0–10.5)
WBC: 8.2 10*3/uL (ref 4.0–10.5)

## 2011-04-25 LAB — URINE MICROSCOPIC-ADD ON

## 2011-04-25 LAB — MRSA PCR SCREENING: MRSA by PCR: NEGATIVE

## 2011-04-25 LAB — PROTIME-INR: Prothrombin Time: 14.1 seconds (ref 11.6–15.2)

## 2011-04-25 MED ORDER — MORPHINE SULFATE 2 MG/ML IJ SOLN
2.0000 mg | INTRAMUSCULAR | Status: DC | PRN
  Administered 2011-04-25 – 2011-04-29 (×18): 2 mg via INTRAVENOUS
  Filled 2011-04-25 (×18): qty 1

## 2011-04-25 MED ORDER — DIPHENHYDRAMINE HCL 50 MG/ML IJ SOLN
INTRAMUSCULAR | Status: AC
  Administered 2011-04-25: 50 mg via INTRAVENOUS
  Filled 2011-04-25: qty 1

## 2011-04-25 MED ORDER — ACETAMINOPHEN 325 MG PO TABS: 650.0000 mg | ORAL_TABLET | Freq: Four times a day (QID) | ORAL | Status: DC | PRN

## 2011-04-25 MED ORDER — PANTOPRAZOLE SODIUM 40 MG PO TBEC
80.0000 mg | DELAYED_RELEASE_TABLET | Freq: Every day | ORAL | Status: DC
  Filled 2011-04-25 (×2): qty 2

## 2011-04-25 MED ORDER — OXYCODONE HCL 5 MG PO TABS
5.0000 mg | ORAL_TABLET | ORAL | Status: DC | PRN
  Administered 2011-04-25: 5 mg via ORAL
  Filled 2011-04-25: qty 1

## 2011-04-25 MED ORDER — FOLIC ACID 1 MG PO TABS
1.0000 mg | ORAL_TABLET | Freq: Every day | ORAL | Status: DC
  Filled 2011-04-25 (×5): qty 1

## 2011-04-25 MED ORDER — LORAZEPAM 2 MG/ML IJ SOLN
1.0000 mg | Freq: Four times a day (QID) | INTRAMUSCULAR | Status: AC | PRN
  Administered 2011-04-25 – 2011-04-28 (×3): 1 mg via INTRAVENOUS
  Filled 2011-04-25 (×9): qty 1

## 2011-04-25 MED ORDER — SODIUM CHLORIDE 0.9 % IV BOLUS (SEPSIS)
500.0000 mL | Freq: Once | INTRAVENOUS | Status: AC
  Administered 2011-04-25: 500 mL via INTRAVENOUS

## 2011-04-25 MED ORDER — SODIUM CHLORIDE 0.9 % IV SOLN
INTRAVENOUS | Status: DC
  Administered 2011-04-25: via INTRAVENOUS
  Administered 2011-04-29: 20 mL/h via INTRAVENOUS

## 2011-04-25 MED ORDER — CLOTRIMAZOLE 1 % EX CREA
TOPICAL_CREAM | Freq: Two times a day (BID) | CUTANEOUS | Status: DC
  Administered 2011-04-28 – 2011-04-30 (×5): via TOPICAL
  Administered 2011-05-01: 1 via TOPICAL
  Administered 2011-05-02 – 2011-05-05 (×6): via TOPICAL
  Administered 2011-05-06: 1 via TOPICAL
  Administered 2011-05-06 – 2011-05-11 (×9): via TOPICAL
  Filled 2011-04-25 (×3): qty 15

## 2011-04-25 MED ORDER — EPINEPHRINE HCL 1 MG/ML IJ SOLN
INTRAMUSCULAR | Status: AC
  Filled 2011-04-25: qty 1

## 2011-04-25 MED ORDER — ADULT MULTIVITAMIN W/MINERALS CH: 1.0000 | ORAL_TABLET | Freq: Every day | ORAL | Status: DC

## 2011-04-25 MED ORDER — IRBESARTAN 75 MG PO TABS
75.0000 mg | ORAL_TABLET | Freq: Every day | ORAL | Status: DC
  Administered 2011-04-25 – 2011-04-26 (×2): 75 mg via ORAL
  Filled 2011-04-25 (×3): qty 1

## 2011-04-25 MED ORDER — DIPHENHYDRAMINE HCL 50 MG/ML IJ SOLN
50.0000 mg | Freq: Once | INTRAMUSCULAR | Status: AC
  Administered 2011-04-25: 50 mg via INTRAVENOUS

## 2011-04-25 MED ORDER — METHYLPREDNISOLONE SODIUM SUCC 40 MG IJ SOLR
INTRAMUSCULAR | Status: AC
  Filled 2011-04-25: qty 2

## 2011-04-25 MED ORDER — METOPROLOL SUCCINATE ER 25 MG PO TB24
25.0000 mg | ORAL_TABLET | ORAL | Status: AC
  Administered 2011-04-25: 25 mg via ORAL
  Filled 2011-04-25 (×2): qty 1

## 2011-04-25 MED ORDER — EPINEPHRINE HCL 0.1 MG/ML IJ SOLN
INTRAMUSCULAR | Status: AC
  Filled 2011-04-25: qty 10

## 2011-04-25 MED ORDER — SODIUM CHLORIDE 0.9 % IJ SOLN
3.0000 mL | Freq: Two times a day (BID) | INTRAMUSCULAR | Status: DC
  Administered 2011-04-26 – 2011-04-27 (×3): 3 mL via INTRAVENOUS
  Administered 2011-04-28: 22:00:00 via INTRAVENOUS
  Administered 2011-04-28 – 2011-05-03 (×8): 3 mL via INTRAVENOUS
  Administered 2011-05-03: 10 mL via INTRAVENOUS
  Administered 2011-05-04 – 2011-05-11 (×12): 3 mL via INTRAVENOUS

## 2011-04-25 MED ORDER — HYDROCORTISONE SOD SUCCINATE 100 MG IJ SOLR
100.0000 mg | INTRAMUSCULAR | Status: AC
  Administered 2011-04-25: 100 mg via INTRAVENOUS
  Filled 2011-04-25: qty 2

## 2011-04-25 MED ORDER — SIMVASTATIN 40 MG PO TABS
40.0000 mg | ORAL_TABLET | Freq: Every day | ORAL | Status: DC
  Administered 2011-04-25: 40 mg via ORAL
  Filled 2011-04-25 (×3): qty 1

## 2011-04-25 MED ORDER — ADULT MULTIVITAMIN W/MINERALS CH
1.0000 | ORAL_TABLET | Freq: Every day | ORAL | Status: DC
  Administered 2011-04-25: 1 via ORAL
  Filled 2011-04-25 (×5): qty 1

## 2011-04-25 MED ORDER — LORAZEPAM 1 MG PO TABS: 1.0000 mg | ORAL_TABLET | Freq: Four times a day (QID) | ORAL | Status: AC | PRN

## 2011-04-25 MED ORDER — THIAMINE HCL 100 MG/ML IJ SOLN
100.0000 mg | Freq: Every day | INTRAMUSCULAR | Status: DC
  Administered 2011-04-25 – 2011-05-10 (×15): 100 mg via INTRAVENOUS
  Filled 2011-04-25 (×16): qty 1

## 2011-04-25 MED ORDER — ACETAMINOPHEN 650 MG RE SUPP
650.0000 mg | Freq: Four times a day (QID) | RECTAL | Status: DC | PRN
  Administered 2011-04-25 – 2011-04-27 (×3): 650 mg via RECTAL
  Filled 2011-04-25 (×2): qty 1

## 2011-04-25 MED ORDER — VITAMIN B-1 100 MG PO TABS
100.0000 mg | ORAL_TABLET | Freq: Every day | ORAL | Status: DC
  Filled 2011-04-25 (×3): qty 1

## 2011-04-25 MED ORDER — METOPROLOL SUCCINATE ER 25 MG PO TB24
25.0000 mg | ORAL_TABLET | Freq: Every day | ORAL | Status: DC
  Administered 2011-04-25 – 2011-04-27 (×3): 25 mg via ORAL
  Filled 2011-04-25 (×3): qty 1

## 2011-04-25 MED ORDER — VITAMIN B-12 100 MCG PO TABS
100.0000 ug | ORAL_TABLET | Freq: Every day | ORAL | Status: DC
  Administered 2011-04-25: 100 ug via ORAL
  Filled 2011-04-25 (×3): qty 1

## 2011-04-25 NOTE — Significant Event (Signed)
Rapid Response Event Note  Overview: Time Called: 1645 Arrival Time: 1648 Event Type:  (? blood reaction)  Initial Focused Assessment:Pt having chills, very pale, mottling noted on both legs, Disoriented at times, speech rambling, c/o pain in lower abd, not wanting to be touched.  Lungs sound essent clear, distant HS. Wife at bedside.   Interventions: EKG done, blood was discontinued by bedside RN, NS started KVO started. CBI continues.   Event Summary: pt transferred to rm 1223. Cont to have chills, Dr Ashley Royalty present.  Elink MD consulted. Name of Physician Notified: Dr Ashley Royalty at 1700  Name of Consulting Physician Notified: Dr Brunilda Payor at 1700  Outcome: Transferred (Comment)  Event End Time: 1725  Natasha Bence

## 2011-04-25 NOTE — Progress Notes (Signed)
04/25/11 2045- Pt has been trying to get OOB since change of shift- attempted to turn lights and decrease stimulation. Pt reoriented and bed alarm turned on. Pt becoming more agitated and violent with staff. Pt's wife reports pt is a daily drinker of atleast 2-3 beers/day for many years and his last drink was 4/6 afternoon. [facility] NP notified- orders received for CIWA protocol. Will continue to monitor.

## 2011-04-25 NOTE — ED Notes (Signed)
Patient placed on heart monitor and 2L of oxygen.

## 2011-04-25 NOTE — ED Provider Notes (Signed)
History     CSN: [id number]  Arrival date & time 04/25/11  0200   First MD Initiated Contact with Patient 04/25/11 0408      Chief Complaint  Patient presents with  . Urinary Retention    (Consider location/radiation/quality/duration/timing/severity/associated sxs/prior treatment) HPI Pt presents with c/o difficulty urinating with lower abdominal pain.  He has seen blood and blood clots in his urine over the past 5 days.  He has a hx of radiation cystitis and has had similar problems in the past.  He was seen at urology office 2 days ago- a larger foley was placed, but patient states it was too uncomfortable so he removed it himself.  Since then he has had increasingly difficult time urinating.  Pain is aching, constant/  No vomiting, no fever/chills. He has also felt lightheaded upon standing, has had increased fatigue.  Pt is currently taking baby aspirin, not taking plavix.   Past Medical History  Diagnosis Date  . ADENOCARCINOMA, PROSTATE   . ALLERGIC RHINITIS   . B12 DEFICIENCY   . BENIGN PROSTATIC HYPERTROPHY   . CAROTID ARTERY DISEASE   . CEREBROVASCULAR ACCIDENT, HX OF   . CORONARY ARTERY DISEASE   . DIVERTICULOSIS, COLON   . GERD   . HEMORRHOIDS   . HIATAL HERNIA   . HYPERLIPIDEMIA   . HYPERTENSION   . OSTEOPENIA   . PEPTIC ULCER DISEASE   . PERIPHERAL VASCULAR DISEASE   . Personal history of alcoholism   . PROSTATE CANCER, HX OF   . PUD, HX OF   . TRANSIENT ISCHEMIC ATTACK   . Shingles   . Anemia   . AS (aortic stenosis)   . S/P AVR (aortic valve replacement) 09/23/2010  . Complication of anesthesia 09/23/2010    very confused after waking up. Stopped drinking 40 days before this surgery.    Past Surgical History  Procedure Date  . Popliteal synovial cyst excision   . Right carotid endarterectomy 1999    Left carotid total chronic occlusion  . Prostate surgery   . Cardic stent 2004  . Aortic valve replacement 09/23/2010    #55mm [facility] Ease  pericardial tissue valve  . Coronary artery bypass graft 09/23/2010    CABG x1 using LIMA to LAD  . Cystoscopy with biopsy 02/15/2011    Procedure: CYSTOSCOPY WITH BIOPSY;  Surgeon: Anner Crete, MD;  Location: [facility];  Service: Urology;  Laterality: N/A;  Cystoscopy, biopsy and fulguration of the bladder    Family History  Problem Relation Age of Onset  . Colon cancer Mother 83  . Diabetes Sister   . Hypertension Sister   . Coronary artery disease Sister   . Multiple sclerosis Sister   . Stroke Brother     History  Substance Use Topics  . Smoking status: Former Smoker    Quit date: 02/14/1958  . Smokeless tobacco: Not on file  . Alcohol Use: 21.0 oz/week    7 Glasses of wine, 28 Cans of beer per week      Review of Systems ROS reviewed and all otherwise negative except for mentioned in HPI  Allergies  Tolterodine tartrate  Home Medications   Current Outpatient Rx  Name Route Sig Dispense Refill  . ATORVASTATIN CALCIUM 20 MG PO TABS Oral Take 1 tablet (20 mg total) by mouth daily. 90 tablet 2  . CLOTRIMAZOLE-BETAMETHASONE 1-0.05 % EX CREA Topical Apply 1 application topically 2 (two) times daily as needed. For rash on/around buttocks.    Marland Kitchen  CYANOCOBALAMIN 100 MCG PO TABS Oral Take 100 mcg by mouth daily.    Marland Kitchen FOLIC ACID 1 MG PO TABS Oral Take 1 tablet (1 mg total) by mouth daily. 30 tablet 0  . METOPROLOL SUCCINATE ER 25 MG PO TB24 Oral Take 1 tablet (25 mg total) by mouth daily. 90 tablet 2  . ADULT MULTIVITAMIN W/MINERALS CH Oral Take 1 tablet by mouth daily.    Marland Kitchen OMEPRAZOLE 40 MG PO CPDR Oral Take 40 mg by mouth daily.      Marland Kitchen VALSARTAN 80 MG PO TABS Oral Take 1 tablet (80 mg total) by mouth daily. 90 tablet 2    BP 142/77  Pulse 94  Temp(Src) 98 F (36.7 C) (Oral)  Resp 18  Ht 5\' 11"  (1.803 m)  Wt 173 lb (78.472 kg)  BMI 24.13 kg/m2  SpO2 99% Vitals reviewed Physical Exam Physical Examination: General appearance - alert, chronically ill appearing, and in no  distress Mental status - alert, oriented to person, place, and time Eyes - pupils equal and reactive, + conjunctival pallor Mouth - mucous membranes moist, pharynx normal without lesions Chest - clear to auscultation, no wheezes, rales or rhonchi, symmetric air entry Heart - normal rate, regular rhythm, normal S1, S2, no murmurs, rubs, clicks or gallops Abdomen - soft, nontender, nondistended, no masses or organomegaly, nabs GU-foley catheter in place draining dark red urine, no clots currently Musculoskeletal - no joint tenderness, deformity or swelling Extremities - peripheral pulses normal, no pedal edema, no clubbing or cyanosis Skin - generalized pallor and normal turgor, no rashes, no bruising  ED Course  Procedures (including critical care time)  Labs Reviewed  URINALYSIS, ROUTINE W REFLEX MICROSCOPIC - Abnormal; Notable for the following:    Color, Urine RED (*) BIOCHEMICALS MAY BE AFFECTED BY COLOR   APPearance TURBID (*)    Specific Gravity, Urine 1.033 (*)    Hgb urine dipstick LARGE (*)    Protein, ur >300 (*)    Leukocytes, UA SMALL (*)    All other components within normal limits  POCT I-STAT, CHEM 8 - Abnormal; Notable for the following:    BUN 26 (*)    Creatinine, Ser 1.50 (*)    Glucose, Bld 127 (*)    Hemoglobin 7.5 (*)    HCT 22.0 (*)    All other components within normal limits  URINE MICROSCOPIC-ADD ON - Abnormal; Notable for the following:    Bacteria, UA FEW (*)    All other components within normal limits  CBC - Abnormal; Notable for the following:    WBC 10.6 (*)    RBC 2.38 (*)    Hemoglobin 7.2 (*)    HCT 21.4 (*)    All other components within normal limits  BASIC METABOLIC PANEL - Abnormal; Notable for the following:    Sodium 131 (*)    Glucose, Bld 125 (*)    BUN 26 (*)    Creatinine, Ser 1.38 (*)    GFR calc non Af Amer 46 (*)    GFR calc Af Amer 53 (*)    All other components within normal limits  PROTIME-INR  APTT  TYPE AND SCREEN    URINE CULTURE   7:24 AM d/w Dr. Brunilda Payor, on call for urology- he will see patient this morning and place 3 way foley for irrigation.    8:38 AM d/w Dr. Jerolyn Center, [facility] hospitalist, pt to be admitted to med/surg bed, [facility] Team 2, temporary admission orders written.  No  results found.   1. Hematuria   2. Anemia   3. Renal insufficiency       MDM  Pt with hx of radiation cystitis presents with gross hematuria and urinary retention.  Retention relieved with placement of foley catheter in ED- draining grossly bloody urine- but catheter draining well without clots at this time. Workup reveal hgb 7.2- pt lightheadaed and pale.  Type and screen ordered.  I have d/w Dr. Brunilda Payor, urology who will see patient this morning.  Pt expresses desire not to have blood transfusion if possible.  D/w [facility] for admission and he will be admitted to their service with urology consult.          Ethelda Chick, MD 04/25/11 [phone]

## 2011-04-25 NOTE — Progress Notes (Signed)
04/25/11 2300- Pt calm and resting. Will continue to monitor.

## 2011-04-25 NOTE — ED Notes (Signed)
Dr Karma Ganja notified of Hgb . No new orders

## 2011-04-25 NOTE — H&P (Signed)
Hospital Admission Note Date: 04/25/2011  Patient name: [name] Medical record number: [id number] Date of birth: 1926-06-09 Age: 78 y.o. Gender: male PCP: Oliver Barre, MD, MD  Attending physician: Altha Harm, MD  Chief Complaint:Hematuria x 4 days  History of Present Illness:Pt is an 78 y/o with a h/o prostate cancer and hematuria approximately 2 months ago. Pt started having hematuria 5 days ago and this has persisted. He was seen by his Urologist 4 days ago and a foley catheter was place and then changed 1 day later. Bleeding has persisted and pat has begun to feel weak and thus came to the ER for further evaluation. Pt has continued to have gross hematuria while in the ER. He states that he was having suprapubic pain which has been relieved with the placement of a 3 way foley. The patient is alert and conversant and denies any N/V/D. Any F/C and denies any chest pain, dizziness, LOC, syncope or near syncope.  Pt wanted me to note in particular that he has an extreme response to general anesthesia.  Scheduled Meds:   . clotrimazole   Topical BID  . diphenhydrAMINE  50 mg Intravenous Once  . EPINEPHrine      . EPINEPHrine      . hydrocortisone sod succinate (SOLU-CORTEF) injection  100 mg Intravenous NOW  . irbesartan  75 mg Oral Daily  . metoprolol succinate  25 mg Oral Daily  . metoprolol succinate  25 mg Oral NOW  . mulitivitamin with minerals  1 tablet Oral Daily  . pantoprazole  80 mg Oral Q1200  . simvastatin  40 mg Oral QPC supper  . sodium chloride  500 mL Intravenous Once  . sodium chloride  3 mL Intravenous Q12H  . cyanocobalamin  100 mcg Oral Daily  . DISCONTD: methylPREDNISolone sodium succinate       Continuous Infusions:  PRN Meds:.acetaminophen, acetaminophen, morphine, oxyCODONE Allergies: Tolterodine tartrate Past Medical History  Diagnosis Date  . ADENOCARCINOMA, PROSTATE   . ALLERGIC RHINITIS   . B12 DEFICIENCY   . BENIGN PROSTATIC HYPERTROPHY     . CAROTID ARTERY DISEASE   . CEREBROVASCULAR ACCIDENT, HX OF   . CORONARY ARTERY DISEASE   . DIVERTICULOSIS, COLON   . GERD   . HEMORRHOIDS   . HIATAL HERNIA   . HYPERLIPIDEMIA   . HYPERTENSION   . OSTEOPENIA   . PEPTIC ULCER DISEASE   . PERIPHERAL VASCULAR DISEASE   . Personal history of alcoholism   . PROSTATE CANCER, HX OF   . PUD, HX OF   . TRANSIENT ISCHEMIC ATTACK   . Shingles   . Anemia   . AS (aortic stenosis)   . S/P AVR (aortic valve replacement) 09/23/2010  . Complication of anesthesia 09/23/2010    very confused after waking up. Stopped drinking 40 days before this surgery.   Past Surgical History  Procedure Date  . Popliteal synovial cyst excision   . Right carotid endarterectomy 1999    Left carotid total chronic occlusion  . Prostate surgery   . Cardic stent 2004  . Aortic valve replacement 09/23/2010    #17mm [facility] Ease pericardial tissue valve  . Coronary artery bypass graft 09/23/2010    CABG x1 using LIMA to LAD  . Cystoscopy with biopsy 02/15/2011    Procedure: CYSTOSCOPY WITH BIOPSY;  Surgeon: Anner Crete, MD;  Location: [facility];  Service: Urology;  Laterality: N/A;  Cystoscopy, biopsy and fulguration of the bladder  Family History  Problem Relation Age of Onset  . Colon cancer Mother 91  . Diabetes Sister   . Hypertension Sister   . Coronary artery disease Sister   . Multiple sclerosis Sister   . Stroke Brother    History   Social History  . Marital Status: Married    Spouse Name: N/A    Number of Children: N/A  . Years of Education: N/A   Occupational History  . Not on file.   Social History Main Topics  . Smoking status: Former Smoker    Quit date: 02/14/1958  . Smokeless tobacco: Not on file  . Alcohol Use: 21.0 oz/week    7 Glasses of wine, 28 Cans of beer per week  . Drug Use: No  . Sexually Active:    Other Topics Concern  . Not on file   Social History Narrative  . No narrative on file   Review of Systems: A  comprehensive review of systems was negative. Physical Exam:  Intake/Output Summary (Last 24 hours) at 04/25/11 1819 Last data filed at 04/25/11 1545  Gross per 24 hour  Intake  262.5 ml  Output   1800 ml  Net -1537.5 ml   General: Alert, awake, oriented x3, in no acute distress.  HEENT: [place]/AT PEERL, EOMI Neck: Trachea midline,  no masses, no thyromegal,y no JVD, no carotid bruit OROPHARYNX:  Moist, No exudate/ erythema/lesions.  Heart: Regular rate and rhythm, without murmurs, rubs, gallops, PMI non-displaced, no heaves or thrills on palpation.  Lungs: Clear to auscultation, no wheezing or rhonchi noted. No increased vocal fremitus resonant to percussion  Abdomen: Soft, nontender, nondistended, positive bowel sounds, no masses no hepatosplenomegaly noted..  Neuro: No focal neurological deficits noted cranial nerves II through XII grossly intact. DTRs 2+ bilaterally upper and lower extremities. Strength functional in bilateral upper and lower extremities. Musculoskeletal: No warm swelling or erythema around joints, no spinal tenderness noted. Psychiatric: Patient alert and oriented x3, good insight and cognition, good recent to remote recall. Skin: Warm and dry. No rashes. Capillary refill < 3 seconds..  Lab results:  Basename 04/25/11 0515 04/25/11 0514  NA 131* 136  K 4.3 4.3  CL 98 104  CO2 24 --  GLUCOSE 125* 127*  BUN 26* 26*  CREATININE 1.38* 1.50*  CALCIUM 9.0 --  MG -- --  PHOS -- --   No results found for this basename: AST:2,ALT:2,ALKPHOS:2,BILITOT:2,PROT:2,ALBUMIN:2 in the last 72 hours No results found for this basename: LIPASE:2,AMYLASE:2 in the last 72 hours  Basename 04/25/11 0515 04/25/11 0514  WBC 10.6* --  NEUTROABS -- --  HGB 7.2* 7.5*  HCT 21.4* 22.0*  MCV 89.9 --  PLT 181 --    Basename 04/25/11 1725  CKTOTAL 67  CKMB 2.5  CKMBINDEX --  TROPONINI <0.30   No components found with this basename: POCBNP:3 No results found for this basename:  DDIMER:2 in the last 72 hours No results found for this basename: HGBA1C:2 in the last 72 hours No results found for this basename: CHOL:2,HDL:2,LDLCALC:2,TRIG:2,CHOLHDL:2,LDLDIRECT:2 in the last 72 hours No results found for this basename: TSH,T4TOTAL,FREET3,T3FREE,THYROIDAB in the last 72 hours No results found for this basename: VITAMINB12:2,FOLATE:2,FERRITIN:2,TIBC:2,IRON:2,RETICCTPCT:2 in the last 72 hours Imaging results:  No results found. Other results:  Patient Active Hospital Problem List: 1. Gross hematuria: I have spoken with Dr. Brunilda Payor from Urology who plans to see patient and likely initiate continuous bladder irrigation.   2. Acute blood anemia: HB presently 7.5. Will transfuse at least 1  unit of blood and monitor Hb  And transfuse as necessary dependent upon blood loss from hematuria.  3. H/O Radiation Cystitis: See above.  4. H/O CAD with stent placement: No symptoms of  Decompensated cardiac disease.  5. H/O AVR: pt was on Plavix however this was discontinued by his PMD and is presently not any antiplatelet agents.   6. HTN: Will resume antihypertensive medications and monitor blood pressures.  Tienna Bienkowski A. 04/25/2011, 6:19 PM

## 2011-04-25 NOTE — ED Notes (Signed)
RN request to obtain labs with the start of IV 

## 2011-04-25 NOTE — Progress Notes (Signed)
Patient ID: [name], male   DOB: 27-Jul-1926, 78 y.o.   MRN: [id number] Total care time 1 hour 25 minutes. Called by the nurse on the floor for patient having change in clinical status. The patient less responsive having mottling of the lower extremities and confusion. This onset occurred while the patient received a transfusion of blood. Based upon the clinical description given to the patient appears that the patient's transfer to the step down unit prior to my arrival. I prepped the right to the step down unit by the patient as described by the nurse incoherent, pale in appearance and with mottling of the bilateral lower extremities which were warm. The patient apparently has been losing multiple clots via the Foley catheter however at the time of my evaluation the patient had blood color irrigation water the Foley catheter and no clots present.  Objective  Focused examination: In general the patient was pale and ill-appearing. The patient appears to be in a state of stress and is tense in the bilateral upper and lower extremities. The patient appears to be having visible shivers and is shaking uncontrollably Vital signs: Temperature rectally was 101, heart rate 131, blood pressure 162/80 HEENT: Pupils are constricted bilaterally but equal. Extraocular movements are intact. Lungs: Clear to auscultation no wheezing or rhonchi noted Cardiovascular: the patient is tachycardic. On telemetry he appears to be in sinus tachycardia. A 12-lead EKG showed atrial fibrillation however I do believe that this is all secondary to the patient shaking. Skin: The patient's skin is warm and dry however the patient has a pattern of mottling on the bilateral lower extremities and along the flank area. Abdomen: Abdomen appears to be soft with mild suprapubic tenderness.  Review of laboratory studies: ABG shows a pH of 7.3 PCO2 of 29.5 PO2 of 109, Hb from blood gas 8.3.  A/P: Pt with RBC transfusion reaction. Will  also get CBC, BMET, D-Dimer, PT/ PTT. Dr. Brunilda Payor with me at the bedside and addressing urologic issues. Will ask Critical Care to assess and give recommendations.

## 2011-04-25 NOTE — Progress Notes (Signed)
CBI set up as ordered drainage was coming from around the catheter with minimal amount in catheter bag. Hand irrigated for about 40 min with copious amount of clots out. Will continue to monitor closely. Marisa Sprinkles RN

## 2011-04-25 NOTE — Progress Notes (Signed)
Urology Consult     Chief Complaint: Gross hematuria  History of Present Illness: Mr. [name] is an 78 years old male who had radical prostatectomy in 2001. He had salvage radiation in 2006. He was seen in the office 4 days ago with gross hematuria. A Foley catheter was then inserted in the bladder. The patient went home, felt uncomfortable and removed the Foley himself. He returned to the office the next day with clot urinary retention. A #24 [place] catheter was inserted in the bladder. The bladder was irrigated and the patient was sent home with the catheter. He removed it yesterday. And this morning he was complaining of difficulty voiding, suprapubic pain. He then came to the emergency room. A #14 French Foley catheter was initially inserted in the bladder but drained only small amount of urine and he was still complaining of pain. I was asked to see him. Catheter was replaced with a #22 [place] three-way Foley. The catheter is draining bloody urine without any blood clots. He does not have anymore suprapubic pain. He stopped taking Plavix 4 days ago. He still takes aspirin 81 mg a day. Patient had gross hematuria in January and had done cystoscopy, bladder biopsy and fulguration of bleeders. His urine cleared off with continuous bladder irrigation. He was doing well until about  4 days ago when he started having gross hematuria again. His hemoglobin on 4/5 was 10 and his hematocrit was 30.3. His hemoglobin today is 7.2 and his hematocrit is 21.4. His BUN is 26 and creatinine 1.38. Patient is admitted for blood transfusion and continuous bladder irrigation.  Past Medical History  Diagnosis Date  . ADENOCARCINOMA, PROSTATE   . ALLERGIC RHINITIS   . B12 DEFICIENCY   . BENIGN PROSTATIC HYPERTROPHY   . CAROTID ARTERY DISEASE   . CEREBROVASCULAR ACCIDENT, HX OF   . CORONARY ARTERY DISEASE   . DIVERTICULOSIS, COLON   . GERD   . HEMORRHOIDS   . HIATAL HERNIA   . HYPERLIPIDEMIA   . HYPERTENSION     . OSTEOPENIA   . PEPTIC ULCER DISEASE   . PERIPHERAL VASCULAR DISEASE   . Personal history of alcoholism   . PROSTATE CANCER, HX OF   . PUD, HX OF   . TRANSIENT ISCHEMIC ATTACK   . Shingles   . Anemia   . AS (aortic stenosis)   . S/P AVR (aortic valve replacement) 09/23/2010  . Complication of anesthesia 09/23/2010    very confused after waking up. Stopped drinking 40 days before this surgery.   Past Surgical History  Procedure Date  . Popliteal synovial cyst excision   . Right carotid endarterectomy 1999    Left carotid total chronic occlusion  . Prostate surgery   . Cardic stent 2004  . Aortic valve replacement 09/23/2010    #74mm [facility] Ease pericardial tissue valve  . Coronary artery bypass graft 09/23/2010    CABG x1 using LIMA to LAD  . Cystoscopy with biopsy 02/15/2011    Procedure: CYSTOSCOPY WITH BIOPSY;  Surgeon: Anner Crete, MD;  Location: [facility];  Service: Urology;  Laterality: N/A;  Cystoscopy, biopsy and fulguration of the bladder    Medications: Aspirin 81 mgm, Diovan 80 mgm, Lipitor 20 mgm, Alendronate 70 mgm, Metoprolol 50 mgm, Prilosec 20 mgm, Vit B1. Allergies:  Allergies  Allergen Reactions  . Tolterodine Tartrate     Family History  Problem Relation Age of Onset  . Colon cancer Mother 35  . Diabetes Sister   .  Hypertension Sister   . Coronary artery disease Sister   . Multiple sclerosis Sister   . Stroke Brother    Social History:  reports that he quit smoking about 53 years ago. He does not have any smokeless tobacco history on file. He reports that he drinks about 21 ounces of alcohol per week. He reports that he does not use illicit drugs.  ROS: All systems are reviewed and negative except as noted.   Physical Exam:  Vital signs in last 24 hours: Temp:  [97.4 F (36.3 C)-98 F (36.7 C)] 98 F (36.7 C) (04/07 0349) Pulse Rate:  [75-115] 94  (04/07 0744) Resp:  [18-22] 18  (04/07 0744) BP: (123-162)/(64-88) 142/77 mmHg (04/07  0744) SpO2:  [97 %-100 %] 99 % (04/07 0744) Weight:  [173 lb (78.472 kg)] 173 lb (78.472 kg) (04/07 0206)  Cardiovascular: Skin warm; not flushed Respiratory: Breaths quiet; no shortness of breath Abdomen: No masses Neurological: Normal sensation to touch Musculoskeletal: Normal motor function arms and legs Lymphatics: No inguinal adenopathy Skin: No rashes Genitourinary: Penis: circumcised.  Scrotum is unremarkable.                          Catheter is now draining bloody urine without blood clots.  Laboratory Data:   Creatinine:  Basename 04/25/11 0515 04/25/11 0514  CREATININE 1.38* 1.50*   Impression/Assessment:  Gross hematuria.  Radiation cystitis.  Adenocarcinoma of prostate.  Anemia.  CAD.  Hypertension.   Plan:  Insert # 22 Fr Foley catheter.  CBI. Transfusion as per Dr Sherril Croon 04/25/2011, 10:55 AM

## 2011-04-25 NOTE — ED Notes (Signed)
Pt has HX of blood clots- recent treatment for this complaint 2 months ago.  Removal foley by pt yesterday because it was hurting. Last time you urinated 4pm blood clots

## 2011-04-25 NOTE — Consult Note (Signed)
Patient name: [name] Medical record number: [id number] Date of birth: December 28, 1926 Age: 78 y.o. Gender: male PCP: Oliver Barre, MD, MD  PCCM ADMISSION NOTE  Date: 04/25/2011 Reason for Consult: Acute transfusion reaction Referring Physician: Altha Harm, MD  History of Present Illness: 78 year old male with extensive PMH relevant for prostate cancer, CVA, CAD, HTN, PVD, AS post valve replacement. 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 Critical care consult called by Dr. Ashley Royalty for evaluation of an acute transfusion reaction. Per the notes and the nurse. He was receiving one unit of PRBC's and suddenly developed confusion, mottling of the skin, shaking and shivers, tachycardia, temperature of 101 and hypertension. Benadryl and hydrocortisone were administered with good response. There is no documentation of stridor, wheezing, upper airway swelling or hypoxemia. At the time of my examination the patient is asleep, breathing comfortably with mild tachycardia of 106/min, normotensive and saturating 100% on RA.  The patient just received Ativan per CIWA scale for alcohol withdrawal. He also received 2 mg of morphine IV.  Lines/tubes : Urethral Catheter Triple-lumen 22 Fr. (Active)  Site Assessment Intact;Other (Comment) 04/25/2011  8:00 PM  Collection Container Standard drainage bag 04/25/2011  8:00 PM  Securement Method Leg strap 04/25/2011  8:00 PM  Urinary Catheter Interventions CBI 04/25/2011  8:00 PM  Output (mL) 1300 mL 04/25/2011  1:00 PM    Microbiology/Sepsis markers: Results for orders placed during the hospital encounter of 04/25/11  MRSA PCR SCREENING     Status: Normal   Collection Time   04/25/11  6:54 PM      Component Value Range Status Comment   MRSA by PCR NEGATIVE  NEGATIVE  Final     Anti-infectives:  Anti-infectives    None      Events:   Past Medical History  Diagnosis Date  . ADENOCARCINOMA, PROSTATE   . ALLERGIC RHINITIS     . B12 DEFICIENCY   . BENIGN PROSTATIC HYPERTROPHY   . CAROTID ARTERY DISEASE   . CEREBROVASCULAR ACCIDENT, HX OF   . CORONARY ARTERY DISEASE   . DIVERTICULOSIS, COLON   . GERD   . HEMORRHOIDS   . HIATAL HERNIA   . HYPERLIPIDEMIA   . HYPERTENSION   . OSTEOPENIA   . PEPTIC ULCER DISEASE   . PERIPHERAL VASCULAR DISEASE   . Personal history of alcoholism   . PROSTATE CANCER, HX OF   . PUD, HX OF   . TRANSIENT ISCHEMIC ATTACK   . Shingles   . Anemia   . AS (aortic stenosis)   . S/P AVR (aortic valve replacement) 09/23/2010  . Complication of anesthesia 09/23/2010    very confused after waking up. Stopped drinking 40 days before this surgery.    Past Surgical History  Procedure Date  . Popliteal synovial cyst excision   . Right carotid endarterectomy 1999    Left carotid total chronic occlusion  . Prostate surgery   . Cardic stent 2004  . Aortic valve replacement 09/23/2010    #6mm [facility] Ease pericardial tissue valve  . Coronary artery bypass graft 09/23/2010    CABG x1 using LIMA to LAD  . Cystoscopy with biopsy 02/15/2011    Procedure: CYSTOSCOPY WITH BIOPSY;  Surgeon: Anner Crete, MD;  Location: [facility];  Service: Urology;  Laterality: N/A;  Cystoscopy, biopsy and fulguration of the bladder    Family History  Problem Relation Age of Onset  . Colon cancer Mother 59  . Diabetes  Sister   . Hypertension Sister   . Coronary artery disease Sister   . Multiple sclerosis Sister   . Stroke Brother     Social History:  reports that he quit smoking about 53 years ago. He does not have any smokeless tobacco history on file. He reports that he drinks about 21 ounces of alcohol per week. He reports that he does not use illicit drugs.  Allergies:  Allergies  Allergen Reactions  . Tolterodine Tartrate     Medications:  Prior to Admission medications   Medication Sig Start Date End Date Taking? Authorizing Provider  atorvastatin (LIPITOR) 20 MG tablet Take 1 tablet (20  mg total) by mouth daily. 02/18/11  Yes Corwin Levins, MD  clotrimazole-betamethasone (LOTRISONE) cream Apply 1 application topically 2 (two) times daily as needed. For rash on/around buttocks.   Yes Historical Provider, MD  cyanocobalamin 100 MCG tablet Take 100 mcg by mouth daily.   Yes Historical Provider, MD  metoprolol succinate (TOPROL-XL) 25 MG 24 hr tablet Take 1 tablet (25 mg total) by mouth daily. 02/18/11  Yes Corwin Levins, MD  Multiple Vitamin (MULITIVITAMIN WITH MINERALS) TABS Take 1 tablet by mouth daily.   Yes Historical Provider, MD  omeprazole (PRILOSEC) 40 MG capsule Take 40 mg by mouth daily.     Yes Historical Provider, MD  valsartan (DIOVAN) 80 MG tablet Take 1 tablet (80 mg total) by mouth daily. 02/18/11  Yes Corwin Levins, MD    ROS: Unable to obtain due to patient somnolence.  Vital signs for last 24 hours: Temp:  [97 F (36.1 C)-100.1 F (37.8 C)] 97 F (36.1 C) (04/07 2000) Pulse Rate:  [75-133] 109  (04/07 2100) Resp:  [13-28] 24  (04/07 2100) BP: (103-196)/(56-126) 116/66 mmHg (04/07 2100) SpO2:  [81 %-100 %] 97 % (04/07 2100) Weight:  [173 lb (78.472 kg)] 173 lb (78.472 kg) (04/07 0206)  Intake/Output this shift: Total I/O In: 150 [I.V.:150] Out: -25   Vent settings for last 24 hours:    Physical Exam:  General: Sleeping comfortably in no apparent distress. Eyes: Anicteric sclerae. ENT: Oropharynx clear. Moist mucous membranes. No thrush Lymph: No cervical, supraclavicular, or axillary lymphadenopathy. Heart: Normal S1, S2. No murmurs, rubs, or gallops appreciated. No bruits, equal pulses. Lungs: Normal excursion, no dullness to percussion. Good air movement bilaterally, without wheezes or crackles. Normal upper airway sounds without evidence of stridor. Abdomen: Abdomen soft, non-tender and not distended, normoactive bowel sounds. No hepatosplenomegaly or masses. Musculoskeletal: No clubbing or synovitis. Skin: No rashes or lesions Neuro: Difficult to  arouse but wakes up with stimulation. Confused. No focal neurologic deficits. (the patient just got Ativan and morphine). Patient able to protect his airway.  LAB RESULT Results for orders placed during the hospital encounter of 04/25/11 (from the past 24 hour(s))  URINALYSIS, ROUTINE W REFLEX MICROSCOPIC     Status: Abnormal   Collection Time   04/25/11  4:24 AM      Component Value Range   Color, Urine RED (*) YELLOW    APPearance TURBID (*) CLEAR    Specific Gravity, Urine 1.033 (*) 1.005 - 1.030    pH 7.0  5.0 - 8.0    Glucose, UA NEGATIVE  NEGATIVE (mg/dL)   Hgb urine dipstick LARGE (*) NEGATIVE    Bilirubin Urine NEGATIVE  NEGATIVE    Ketones, ur NEGATIVE  NEGATIVE (mg/dL)   Protein, ur >161 (*) NEGATIVE (mg/dL)   Urobilinogen, UA 0.2  0.0 - 1.0 (  mg/dL)   Nitrite NEGATIVE  NEGATIVE    Leukocytes, UA SMALL (*) NEGATIVE   URINE MICROSCOPIC-ADD ON     Status: Abnormal   Collection Time   04/25/11  4:24 AM      Component Value Range   WBC, UA FIELD OBSCURED BY RBC'S  <3 (WBC/hpf)   RBC / HPF TOO NUMEROUS TO COUNT  <3 (RBC/hpf)   Bacteria, UA FEW (*) RARE    Urine-Other URINALYSIS PERFORMED ON SUPERNATANT    PROTIME-INR     Status: Normal   Collection Time   04/25/11  5:02 AM      Component Value Range   Prothrombin Time 13.4  11.6 - 15.2 (seconds)   INR 1.00  0.00 - 1.49   APTT     Status: Normal   Collection Time   04/25/11  5:02 AM      Component Value Range   aPTT 36  24 - 37 (seconds)  POCT I-STAT, CHEM 8     Status: Abnormal   Collection Time   04/25/11  5:14 AM      Component Value Range   Sodium 136  135 - 145 (mEq/L)   Potassium 4.3  3.5 - 5.1 (mEq/L)   Chloride 104  96 - 112 (mEq/L)   BUN 26 (*) 6 - 23 (mg/dL)   Creatinine, Ser 5.62 (*) 0.50 - 1.35 (mg/dL)   Glucose, Bld 130 (*) 70 - 99 (mg/dL)   Calcium, Ion 8.65  7.84 - 1.32 (mmol/L)   TCO2 23  0 - 100 (mmol/L)   Hemoglobin 7.5 (*) 13.0 - 17.0 (g/dL)   HCT 69.6 (*) 29.5 - 52.0 (%)  CBC     Status: Abnormal    Collection Time   04/25/11  5:15 AM      Component Value Range   WBC 10.6 (*) 4.0 - 10.5 (K/uL)   RBC 2.38 (*) 4.22 - 5.81 (MIL/uL)   Hemoglobin 7.2 (*) 13.0 - 17.0 (g/dL)   HCT 28.4 (*) 13.2 - 52.0 (%)   MCV 89.9  78.0 - 100.0 (fL)   MCH 30.3  26.0 - 34.0 (pg)   MCHC 33.6  30.0 - 36.0 (g/dL)   RDW 44.0  10.2 - 72.5 (%)   Platelets 181  150 - 400 (K/uL)  BASIC METABOLIC PANEL     Status: Abnormal   Collection Time   04/25/11  5:15 AM      Component Value Range   Sodium 131 (*) 135 - 145 (mEq/L)   Potassium 4.3  3.5 - 5.1 (mEq/L)   Chloride 98  96 - 112 (mEq/L)   CO2 24  19 - 32 (mEq/L)   Glucose, Bld 125 (*) 70 - 99 (mg/dL)   BUN 26 (*) 6 - 23 (mg/dL)   Creatinine, Ser 3.66 (*) 0.50 - 1.35 (mg/dL)   Calcium 9.0  8.4 - 44.0 (mg/dL)   GFR calc non Af Amer 46 (*) >90 (mL/min)   GFR calc Af Amer 53 (*) >90 (mL/min)  TYPE AND SCREEN     Status: Normal (Preliminary result)   Collection Time   04/25/11  5:40 AM      Component Value Range   ABO/RH(D) O NEG     Antibody Screen NEG     Sample Expiration 04/28/2011     Unit Number [id number]     Blood Component Type RED CELLS,LR     Unit division 00     Status of Unit ISSUED  Transfusion Status OK TO TRANSFUSE     Crossmatch Result Compatible    PREPARE RBC (CROSSMATCH)     Status: Normal   Collection Time   04/25/11 10:30 AM      Component Value Range   Order Confirmation ORDER PROCESSED BY BLOOD BANK    CARDIAC PANEL(CRET KIN+CKTOT+MB+TROPI)     Status: Normal   Collection Time   04/25/11  5:25 PM      Component Value Range   Total CK 67  7 - 232 (U/L)   CK, MB 2.5  0.3 - 4.0 (ng/mL)   Troponin I <0.30  <0.30 (ng/mL)   Relative Index RELATIVE INDEX IS INVALID  0.0 - 2.5   TRANSFUSION REACTION     Status: Normal (Preliminary result)   Collection Time   04/25/11  6:08 PM      Component Value Range   Post RXN DAT IgG NEG     DAT C3 NEG     Path interp tx rxn PENDING    CBC     Status: Abnormal   Collection Time   04/25/11  6:08  PM      Component Value Range   WBC 8.2  4.0 - 10.5 (K/uL)   RBC 2.77 (*) 4.22 - 5.81 (MIL/uL)   Hemoglobin 8.4 (*) 13.0 - 17.0 (g/dL)   HCT 16.1 (*) 09.6 - 52.0 (%)   MCV 91.7  78.0 - 100.0 (fL)   MCH 30.3  26.0 - 34.0 (pg)   MCHC 33.1  30.0 - 36.0 (g/dL)   RDW 04.5  40.9 - 81.1 (%)   Platelets 230  150 - 400 (K/uL)  DIFFERENTIAL     Status: Abnormal   Collection Time   04/25/11  6:08 PM      Component Value Range   Neutrophils Relative 87 (*) 43 - 77 (%)   Neutro Abs 7.1  1.7 - 7.7 (K/uL)   Lymphocytes Relative 6 (*) 12 - 46 (%)   Lymphs Abs 0.5 (*) 0.7 - 4.0 (K/uL)   Monocytes Relative 6  3 - 12 (%)   Monocytes Absolute 0.5  0.1 - 1.0 (K/uL)   Eosinophils Relative 1  0 - 5 (%)   Eosinophils Absolute 0.1  0.0 - 0.7 (K/uL)   Basophils Relative 0  0 - 1 (%)   Basophils Absolute 0.0  0.0 - 0.1 (K/uL)  BASIC METABOLIC PANEL     Status: Abnormal   Collection Time   04/25/11  6:08 PM      Component Value Range   Sodium 134 (*) 135 - 145 (mEq/L)   Potassium 4.3  3.5 - 5.1 (mEq/L)   Chloride 103  96 - 112 (mEq/L)   CO2 18 (*) 19 - 32 (mEq/L)   Glucose, Bld 156 (*) 70 - 99 (mg/dL)   BUN 24 (*) 6 - 23 (mg/dL)   Creatinine, Ser 9.14  0.50 - 1.35 (mg/dL)   Calcium 8.2 (*) 8.4 - 10.5 (mg/dL)   GFR calc non Af Amer 49 (*) >90 (mL/min)   GFR calc Af Amer 56 (*) >90 (mL/min)  D-DIMER, QUANTITATIVE     Status: Abnormal   Collection Time   04/25/11  6:08 PM      Component Value Range   D-Dimer, Quant 1.94 (*) 0.00 - 0.48 (ug/mL-FEU)  APTT     Status: Normal   Collection Time   04/25/11  6:08 PM      Component Value Range   aPTT 30  24 - 37 (seconds)  PROTIME-INR     Status: Normal   Collection Time   04/25/11  6:08 PM      Component Value Range   Prothrombin Time 14.1  11.6 - 15.2 (seconds)   INR 1.07  0.00 - 1.49   BLOOD GAS, ARTERIAL     Status: Abnormal   Collection Time   04/25/11  6:30 PM      Component Value Range   O2 Content 2.0     Delivery systems NASAL CANNULA     pH,  Arterial 7.358  7.350 - 7.450    pCO2 arterial 29.5 (*) 35.0 - 45.0 (mmHg)   pO2, Arterial 109.0 (*) 80.0 - 100.0 (mmHg)   Bicarbonate 16.2 (*) 20.0 - 24.0 (mEq/L)   TCO2 15.4  0 - 100 (mmol/L)   Acid-base deficit 7.9 (*) 0.0 - 2.0 (mmol/L)   O2 Saturation 98.8     Patient temperature 98.6     Collection site BRACHIAL ARTERY     Drawn by [id number]     Sample type ARTERIAL DRAW     Allens test (pass/fail) PASS  PASS   MRSA PCR SCREENING     Status: Normal   Collection Time   04/25/11  6:54 PM      Component Value Range   MRSA by PCR NEGATIVE  NEGATIVE      Assessment:  78 year old male with extensive PMH. 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 Developed fever, chills, shivering and confusional state associated to a PRBC transfusion. His repeat Hgb showed improvement from 7.2 to 8.4. His ABG showed pH 7.35, CO2 29, PO2 109, bicarb 16.2, O2 sat 98%. There was no evidence of airway compromise. At the time of my examination the patient is resting comfortably, extremities are warm, hemodynamically stable, saturating 100% on RA. On exam no evidence of upper airway swelling or airway compromise. His symptoms certainly are compatible with an acute febrile non hemolytic reaction. He had good response to Benadryl and hydrocortisone. Of note, the patient has history of heavy alcohol consumption and alcohol withdrawal ma be playing a role on this patient symptoms. Currently calm, comfortable but confuse when I woke him up. Confusion likely secondary to recent doses of Ativan and morphine.  Recommendations: Acute febrile, non hemolytic reaction after PRBC transfusion - Good response to antihistaminics and steroids - Now hemodynamically stable and resting comfortably - Please follow transfusion reaction protocol - Follow CBC in am - Consider blood and urine cultures if new fever spike.  Alcohol abuse / withdrawal - Ativan per CIWA scale   Gross hematuria secondary to radiation  cystitis - Management per urology    Overton Mam, M.D. Pulmonary and Critical Care Medicine Call E-link with questions [phone]  04/25/2011

## 2011-04-25 NOTE — Progress Notes (Signed)
04/25/11 2100- @2030  pt's foley catheter stopped flowing. Myself and charge RN attempted to irrigate manually to open flow- unsuccessful. Dr. Vernie Ammons paged (urology) and notified of current status- orders received to d/c foley and replace with 24fr coudet catheter; and to irrigate after placing to return flow of urine. Completed orders at that time and urine flow returned with clots present. Will continue to monitor.

## 2011-04-26 ENCOUNTER — Inpatient Hospital Stay (HOSPITAL_COMMUNITY): Payer: Medicare Other

## 2011-04-26 DIAGNOSIS — F29 Unspecified psychosis not due to a substance or known physiological condition: Secondary | ICD-10-CM

## 2011-04-26 DIAGNOSIS — T8089XA Other complications following infusion, transfusion and therapeutic injection, initial encounter: Secondary | ICD-10-CM

## 2011-04-26 DIAGNOSIS — D62 Acute posthemorrhagic anemia: Secondary | ICD-10-CM | POA: Diagnosis present

## 2011-04-26 DIAGNOSIS — R5084 Febrile nonhemolytic transfusion reaction: Secondary | ICD-10-CM | POA: Insufficient documentation

## 2011-04-26 LAB — PREPARE RBC (CROSSMATCH)

## 2011-04-26 LAB — BASIC METABOLIC PANEL
BUN: 25 mg/dL — ABNORMAL HIGH (ref 6–23)
Calcium: 7.6 mg/dL — ABNORMAL LOW (ref 8.4–10.5)
GFR calc Af Amer: 61 mL/min — ABNORMAL LOW (ref >90)
GFR calc non Af Amer: 52 mL/min — ABNORMAL LOW (ref >90)
Potassium: 4.2 mEq/L (ref 3.5–5.1)
Sodium: 137 mEq/L (ref 135–145)

## 2011-04-26 LAB — CBC
MCHC: 33.5 g/dL (ref 30.0–36.0)
RDW: 14 % (ref 11.5–15.5)

## 2011-04-26 LAB — DIFFERENTIAL
Basophils Absolute: 0 10*3/uL (ref 0.0–0.1)
Eosinophils Absolute: 0 10*3/uL (ref 0.0–0.7)
Lymphocytes Relative: 3 % — ABNORMAL LOW (ref 12–46)
Monocytes Absolute: 0.8 10*3/uL (ref 0.1–1.0)
Neutro Abs: 15.4 10*3/uL — ABNORMAL HIGH (ref 1.7–7.7)
Neutrophils Relative %: 92 % — ABNORMAL HIGH (ref 43–77)

## 2011-04-26 LAB — TRANSFUSION REACTION

## 2011-04-26 MED ORDER — FUROSEMIDE 10 MG/ML IJ SOLN
40.0000 mg | Freq: Two times a day (BID) | INTRAMUSCULAR | Status: AC
  Administered 2011-04-26 – 2011-04-28 (×4): 40 mg via INTRAVENOUS
  Filled 2011-04-26 (×4): qty 4

## 2011-04-26 MED ORDER — MAGNESIUM SULFATE 40 MG/ML IJ SOLN
2.0000 g | Freq: Once | INTRAMUSCULAR | Status: AC
  Administered 2011-04-26: 2 g via INTRAVENOUS
  Filled 2011-04-26: qty 50

## 2011-04-26 MED ORDER — LORAZEPAM 2 MG/ML IJ SOLN
1.0000 mg | INTRAMUSCULAR | Status: DC | PRN
  Administered 2011-04-26 – 2011-04-29 (×14): 2 mg via INTRAVENOUS
  Filled 2011-04-26 (×8): qty 1

## 2011-04-26 MED ORDER — SODIUM CHLORIDE 0.9 % IR SOLN
Status: AC
  Administered 2011-04-26 – 2011-04-28 (×3): via INTRAVESICAL
  Filled 2011-04-26 (×8): qty 40

## 2011-04-26 MED ORDER — FUROSEMIDE 10 MG/ML IJ SOLN
40.0000 mg | Freq: Once | INTRAMUSCULAR | Status: AC
  Administered 2011-04-26: 40 mg via INTRAVENOUS
  Filled 2011-04-26: qty 4

## 2011-04-26 NOTE — Progress Notes (Signed)
CTSP for altered mental status, RR elevated as getting one unit of PRBC. 1/2 of unit infused. Hgb only 7.1 this am.  Will plan to hold further transfusion for now. Obtain CXR  Shan Levans Beeper  [phone]  Cell  [phone]  If no response or cell goes to voicemail, call beeper [phone]

## 2011-04-26 NOTE — Progress Notes (Signed)
04/26/11 0030- Rn sitting at bedside for close monitoring. Pt trying to get OOB frequently and unaware of place, time, or situation. Will report to charge nurse and see about having a Recruitment consultant for pt.

## 2011-04-26 NOTE — Telephone Encounter (Signed)
Patients wife informed of information. She stated he was in the hospital now and would follow-up with Dr. [name] once back home.

## 2011-04-26 NOTE — Progress Notes (Signed)
eLink Physician-Brief Progress Note Patient Name: JAVARIAN JAKUBIAK DOB: [birth year] MRN: [id number]  Date of Service  04/26/2011   HPI/Events of Note   pcxr - edema, now on O2  eICU Interventions  lasix   Intervention Category Major Interventions: Respiratory failure - evaluation and management  Nelda Bucks. 04/26/2011, 8:04 PM

## 2011-04-26 NOTE — Progress Notes (Signed)
04/26/11 0320- Pt had 29 beat run of [place] at 0236. VSS stable, pt asymptomatic, denies chest pain. [facility] NP notified. Orders received. Will continue to monitor.

## 2011-04-26 NOTE — Progress Notes (Signed)
UR completed 
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
## 2011-04-26 NOTE — Progress Notes (Signed)
Patient name: [name] Medical record number: [id number] Date of birth: 1926-03-07 Age: 78 y.o. Gender: male PCP: Oliver Barre, MD, MD  PCCM FU NOTE  Date: 04/26/2011 Reason for Consult: Acute transfusion reaction Referring Physician: Altha Harm, MD  History of Present Illness: 78 year old male with extensive PMH relevant for prostate cancer, CVA, CAD, HTN, PVD, AS post valve replacement. 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 Critical care consult called by Dr. Ashley Royalty for evaluation of an acute transfusion reaction. Per the notes and the nurse. He was receiving one unit of PRBC's and suddenly developed confusion, mottling of the skin, shaking and shivers, tachycardia, temperature of 101 and hypertension. Benadryl and hydrocortisone were administered with good response. There is no documentation of stridor, wheezing, upper airway swelling or hypoxemia. At the time of my examination the patient is asleep, breathing comfortably with mild tachycardia of 106/min, normotensive and saturating 100% on RA.  The patient is on  Ativan per CIWA scale for alcohol withdrawal. He is also  on morphine IV.  Lines/tubes : Urethral Catheter Triple-lumen 22 Fr. (Active)  Site Assessment Intact;Other (Comment) 04/25/2011  8:00 PM  Collection Container Standard drainage bag 04/25/2011  8:00 PM  Securement Method Leg strap 04/25/2011  8:00 PM  Urinary Catheter Interventions CBI 04/25/2011  8:00 PM  Output (mL) 1300 mL 04/25/2011  1:00 PM    Microbiology/Sepsis markers: Results for orders placed during the hospital encounter of 04/25/11  MRSA PCR SCREENING     Status: Normal   Collection Time   04/25/11  6:54 PM      Component Value Range Status Comment   MRSA by PCR NEGATIVE  NEGATIVE  Final      Events:   Past Medical History  Diagnosis Date  . ADENOCARCINOMA, PROSTATE   . ALLERGIC RHINITIS   . B12 DEFICIENCY   . BENIGN PROSTATIC HYPERTROPHY   . CAROTID ARTERY  DISEASE   . CEREBROVASCULAR ACCIDENT, HX OF   . CORONARY ARTERY DISEASE   . DIVERTICULOSIS, COLON   . GERD   . HEMORRHOIDS   . HIATAL HERNIA   . HYPERLIPIDEMIA   . HYPERTENSION   . OSTEOPENIA   . PEPTIC ULCER DISEASE   . PERIPHERAL VASCULAR DISEASE   . Personal history of alcoholism   . PROSTATE CANCER, HX OF   . PUD, HX OF   . TRANSIENT ISCHEMIC ATTACK   . Shingles   . Anemia   . AS (aortic stenosis)   . S/P AVR (aortic valve replacement) 09/23/2010  . Complication of anesthesia 09/23/2010    very confused after waking up. Stopped drinking 40 days before this surgery.   Well known to urology service, confused this am, denies pain, attempts to get oob, sitter at bedside  Vital signs for last 24 hours: Temp:  [97 F (36.1 C)-100.1 F (37.8 C)] 98.3 F (36.8 C) (04/08 0800) Pulse Rate:  [84-133] 117  (04/08 0800) Resp:  [13-28] 28  (04/08 0800) BP: (103-196)/(56-126) 112/70 mmHg (04/08 0700) SpO2:  [81 %-100 %] 95 % (04/08 0800) Weight:  [84.8 kg (186 lb 15.2 oz)] 84.8 kg (186 lb 15.2 oz) (04/08 0100)  Intake/Output this shift: Total I/O In: 20 [I.V.:20] Out: -      Physical Exam:  General: int agitation,  in no apparent distress. Eyes: Anicteric sclerae. ENT: Oropharynx clear. Moist mucous membranes. No thrush Lymph: No cervical, supraclavicular, or axillary lymphadenopathy. Heart: Normal S1, S2. No murmurs, rubs, or gallops  appreciated. No bruits, equal pulses. Lungs: Normal excursion, no dullness to percussion. Good air movement bilaterally, without wheezes or crackles. Normal upper airway sounds without evidence of stridor. Abdomen: Abdomen soft, non-tender and not distended, normoactive bowel sounds. No hepatosplenomegaly or masses. Musculoskeletal: No clubbing or synovitis. Skin: No rashes or lesions Neuro: Confused. Tremors+ No focal neurologic deficits.  Patient able to protect his airway.   LAB RESULT Results for orders placed during the hospital encounter  of 04/25/11 (from the past 24 hour(s))  PREPARE RBC (CROSSMATCH)     Status: Normal   Collection Time   04/25/11 10:30 AM      Component Value Range   Order Confirmation ORDER PROCESSED BY BLOOD BANK    CARDIAC PANEL(CRET KIN+CKTOT+MB+TROPI)     Status: Normal   Collection Time   04/25/11  5:25 PM      Component Value Range   Total CK 67  7 - 232 (U/L)   CK, MB 2.5  0.3 - 4.0 (ng/mL)   Troponin I <0.30  <0.30 (ng/mL)   Relative Index RELATIVE INDEX IS INVALID  0.0 - 2.5   TRANSFUSION REACTION     Status: Normal (Preliminary result)   Collection Time   04/25/11  6:08 PM      Component Value Range   Post RXN DAT IgG NEG     DAT C3 NEG     Path interp tx rxn PENDING    CBC     Status: Abnormal   Collection Time   04/25/11  6:08 PM      Component Value Range   WBC 8.2  4.0 - 10.5 (K/uL)   RBC 2.77 (*) 4.22 - 5.81 (MIL/uL)   Hemoglobin 8.4 (*) 13.0 - 17.0 (g/dL)   HCT 16.1 (*) 09.6 - 52.0 (%)   MCV 91.7  78.0 - 100.0 (fL)   MCH 30.3  26.0 - 34.0 (pg)   MCHC 33.1  30.0 - 36.0 (g/dL)   RDW 04.5  40.9 - 81.1 (%)   Platelets 230  150 - 400 (K/uL)  DIFFERENTIAL     Status: Abnormal   Collection Time   04/25/11  6:08 PM      Component Value Range   Neutrophils Relative 87 (*) 43 - 77 (%)   Neutro Abs 7.1  1.7 - 7.7 (K/uL)   Lymphocytes Relative 6 (*) 12 - 46 (%)   Lymphs Abs 0.5 (*) 0.7 - 4.0 (K/uL)   Monocytes Relative 6  3 - 12 (%)   Monocytes Absolute 0.5  0.1 - 1.0 (K/uL)   Eosinophils Relative 1  0 - 5 (%)   Eosinophils Absolute 0.1  0.0 - 0.7 (K/uL)   Basophils Relative 0  0 - 1 (%)   Basophils Absolute 0.0  0.0 - 0.1 (K/uL)  BASIC METABOLIC PANEL     Status: Abnormal   Collection Time   04/25/11  6:08 PM      Component Value Range   Sodium 134 (*) 135 - 145 (mEq/L)   Potassium 4.3  3.5 - 5.1 (mEq/L)   Chloride 103  96 - 112 (mEq/L)   CO2 18 (*) 19 - 32 (mEq/L)   Glucose, Bld 156 (*) 70 - 99 (mg/dL)   BUN 24 (*) 6 - 23 (mg/dL)   Creatinine, Ser 9.14  0.50 - 1.35 (mg/dL)    Calcium 8.2 (*) 8.4 - 10.5 (mg/dL)   GFR calc non Af Amer 49 (*) >90 (mL/min)   GFR calc Af [place]  56 (*) >90 (mL/min)  D-DIMER, QUANTITATIVE     Status: Abnormal   Collection Time   04/25/11  6:08 PM      Component Value Range   D-Dimer, Quant 1.94 (*) 0.00 - 0.48 (ug/mL-FEU)  APTT     Status: Normal   Collection Time   04/25/11  6:08 PM      Component Value Range   aPTT 30  24 - 37 (seconds)  PROTIME-INR     Status: Normal   Collection Time   04/25/11  6:08 PM      Component Value Range   Prothrombin Time 14.1  11.6 - 15.2 (seconds)   INR 1.07  0.00 - 1.49   BLOOD GAS, ARTERIAL     Status: Abnormal   Collection Time   04/25/11  6:30 PM      Component Value Range   O2 Content 2.0     Delivery systems NASAL CANNULA     pH, Arterial 7.358  7.350 - 7.450    pCO2 arterial 29.5 (*) 35.0 - 45.0 (mmHg)   pO2, Arterial 109.0 (*) 80.0 - 100.0 (mmHg)   Bicarbonate 16.2 (*) 20.0 - 24.0 (mEq/L)   TCO2 15.4  0 - 100 (mmol/L)   Acid-base deficit 7.9 (*) 0.0 - 2.0 (mmol/L)   O2 Saturation 98.8     Patient temperature 98.6     Collection site BRACHIAL ARTERY     Drawn by [id number]     Sample type ARTERIAL DRAW     Allens test (pass/fail) PASS  PASS   MRSA PCR SCREENING     Status: Normal   Collection Time   04/25/11  6:54 PM      Component Value Range   MRSA by PCR NEGATIVE  NEGATIVE   CBC     Status: Abnormal   Collection Time   04/26/11  4:20 AM      Component Value Range   WBC 16.7 (*) 4.0 - 10.5 (K/uL)   RBC 2.37 (*) 4.22 - 5.81 (MIL/uL)   Hemoglobin 7.2 (*) 13.0 - 17.0 (g/dL)   HCT 46.9 (*) 62.9 - 52.0 (%)   MCV 90.7  78.0 - 100.0 (fL)   MCH 30.4  26.0 - 34.0 (pg)   MCHC 33.5  30.0 - 36.0 (g/dL)   RDW 52.8  41.3 - 24.4 (%)   Platelets 191  150 - 400 (K/uL)  DIFFERENTIAL     Status: Abnormal   Collection Time   04/26/11  4:20 AM      Component Value Range   Neutrophils Relative 92 (*) 43 - 77 (%)   Lymphocytes Relative 3 (*) 12 - 46 (%)   Monocytes Relative 5  3 - 12 (%)   Eosinophils  Relative 0  0 - 5 (%)   Basophils Relative 0  0 - 1 (%)   Neutro Abs 15.4 (*) 1.7 - 7.7 (K/uL)   Lymphs Abs 0.5 (*) 0.7 - 4.0 (K/uL)   Monocytes Absolute 0.8  0.1 - 1.0 (K/uL)   Eosinophils Absolute 0.0  0.0 - 0.7 (K/uL)   Basophils Absolute 0.0  0.0 - 0.1 (K/uL)   WBC Morphology MILD LEFT SHIFT (1-5% METAS, OCC MYELO, OCC BANDS)    BASIC METABOLIC PANEL     Status: Abnormal   Collection Time   04/26/11  4:20 AM      Component Value Range   Sodium 137  135 - 145 (mEq/L)   Potassium 4.2  3.5 - 5.1 (mEq/L)  Chloride 109  96 - 112 (mEq/L)   CO2 18 (*) 19 - 32 (mEq/L)   Glucose, Bld 92  70 - 99 (mg/dL)   BUN 25 (*) 6 - 23 (mg/dL)   Creatinine, Ser 1.19  0.50 - 1.35 (mg/dL)   Calcium 7.6 (*) 8.4 - 10.5 (mg/dL)   GFR calc non Af Amer 52 (*) >90 (mL/min)   GFR calc Af Amer 61 (*) >90 (mL/min)  MAGNESIUM     Status: Abnormal   Collection Time   04/26/11  4:20 AM      Component Value Range   Magnesium 1.4 (*) 1.5 - 2.5 (mg/dL)     Assessment:  78 year old male with extensive PMH. 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 Developed fever, chills, shivering and confusional state associated to a PRBC transfusion.  His symptoms certainly are compatible with an acute febrile non hemolytic reaction. He had good response to Benadryl and hydrocortisone. Of note, the patient has history of heavy alcohol consumption and alcohol withdrawal ma be playing a role on this patient symptoms. Currently calm, comfortable but confused when I woke him up. Confusion likely secondary to recent doses of Ativan and morphine.  Recommendations: Acute febrile, non hemolytic reaction after PRBC transfusion - Good response to antihistaminics and steroids - Now hemodynamically stable and resting comfortably - Please follow transfusion reaction protocol - Transfuse for Hb <7 - Consider blood and urine cultures if new fever spike.  Alcohol abuse / withdrawal - Ativan per CIWA scale , can increase  to 1-2 mg prn, replete Mg -Beer has been ordered by urology service who knows him well  Gross hematuria secondary to radiation cystitis - Management per urology  PCCM to sign off SD status appropriate, can transfer out in 24 h unless withdrawal worsens  Cartez Mogle V.    04/26/2011

## 2011-04-26 NOTE — Progress Notes (Signed)
Patient continuously removing 2 L [place] from face. Pt O2 saturation ranging from 83-98%. Pt education done on importance of [place]. No evidence of learning. Will continue to monitor O2 saturation and to reapply [place].

## 2011-04-26 NOTE — Progress Notes (Signed)
Subjective: Pt is a habitual drinker who underestimates his drinking and went into ETOH withdrawal last night. I spoke with Dr. Patsi Sears who knows this patient well and he reports that he usually fails the benzodiazapine protocols and typically requires Beer while hospitalized. Pt was  Awake earlier and reportedly today and swinging at the nurses. At the time of my examination, pt is asleep and with mild tachycardia on examination and in the Telemetry monitor. He is saturating 100% on RA.  Objective: Filed Vitals:   04/26/11 0400 04/26/11 0600 04/26/11 0700 04/26/11 0800  BP: 153/61 161/76 112/70   Pulse: 107 109    Temp: 99.6 F (37.6 C)   98.3 F (36.8 C)  TempSrc: Axillary   Axillary  Resp:  24 27   Height:      Weight:      SpO2: 94% 94%     Weight change: 6.328 kg (13 lb 15.2 oz)  Intake/Output Summary (Last 24 hours) at 04/26/11 0830 Last data filed at 04/26/11 0700  Gross per 24 hour  Intake  637.5 ml  Output   5250 ml  Net -4612.5 ml    General: Somnolent in no acute distress.  HEENT: [place]/AT PEERL, EOMI Neck: Trachea midline,  no masses, no thyromegal,y no JVD, no carotid bruit OROPHARYNX:  Moist, No exudate/ erythema/lesions.  Heart:S1, S2 normal. He is without murmurs, rubs, gallops, PMI non-displaced, no heaves or thrills on palpation.  Lungs: Clear to auscultation, no wheezing or rhonchi noted. No increased vocal fremitus resonant to percussion  Abdomen: distended and with expression of diffuse tenderness. Positive bowel sounds, no masses no hepatosplenomegaly noted..  Neuro: No focal neurological deficits noted cranial nerves II through XII grossly intact. DTRs 2+ bilaterally upper and lower extremities. Musculoskeletal: No warm swelling or erythema around joints, no spinal tenderness noted. Skin: Warm and dry except over the knees which also displays some mottling over them.    Lab Results:  [facility] 04/26/11 0420 04/25/11 1808  NA 137 134*  K 4.2 4.3  CL  109 103  CO2 18* 18*  GLUCOSE 92 156*  BUN 25* 24*  CREATININE 1.23 1.31  CALCIUM 7.6* 8.2*  MG 1.4* --  PHOS -- --   No results found for this basename: AST:2,ALT:2,ALKPHOS:2,BILITOT:2,PROT:2,ALBUMIN:2 in the last 72 hours No results found for this basename: LIPASE:2,AMYLASE:2 in the last 72 hours  Basename 04/26/11 0420 04/25/11 1808  WBC 16.7* 8.2  NEUTROABS 15.4* 7.1  HGB 7.2* 8.4*  HCT 21.5* 25.4*  MCV 90.7 91.7  PLT 191 230    Basename 04/25/11 1725  CKTOTAL 67  CKMB 2.5  CKMBINDEX --  TROPONINI <0.30   No components found with this basename: POCBNP:3  Basename 04/25/11 1808  DDIMER 1.94*   No results found for this basename: HGBA1C:2 in the last 72 hours No results found for this basename: CHOL:2,HDL:2,LDLCALC:2,TRIG:2,CHOLHDL:2,LDLDIRECT:2 in the last 72 hours No results found for this basename: TSH,T4TOTAL,FREET3,T3FREE,THYROIDAB in the last 72 hours No results found for this basename: VITAMINB12:2,FOLATE:2,FERRITIN:2,TIBC:2,IRON:2,RETICCTPCT:2 in the last 72 hours  Micro Results: Recent Results (from the past 240 hour(s))  MRSA PCR SCREENING     Status: Normal   Collection Time   04/25/11  6:54 PM      Component Value Range Status Comment   MRSA by PCR NEGATIVE  NEGATIVE  Final     Studies/Results: No results found.  Medications: I have reviewed the patient's current medications. Scheduled Meds:   . clotrimazole   Topical BID  . diphenhydrAMINE  50 mg Intravenous Once  . EPINEPHrine      . EPINEPHrine      . folic acid  1 mg Oral Daily  . hydrocortisone sod succinate (SOLU-CORTEF) injection  100 mg Intravenous NOW  . irbesartan  75 mg Oral Daily  . metoprolol succinate  25 mg Oral Daily  . metoprolol succinate  25 mg Oral NOW  . mulitivitamin with minerals  1 tablet Oral Daily  . pantoprazole  80 mg Oral Q1200  . simvastatin  40 mg Oral QPC supper  . sodium chloride  3 mL Intravenous Q12H  . thiamine  100 mg Oral Daily   Or  . thiamine  100  mg Intravenous Daily  . cyanocobalamin  100 mcg Oral Daily  . DISCONTD: methylPREDNISolone sodium succinate      . DISCONTD: mulitivitamin with minerals  1 tablet Oral Daily   Continuous Infusions:   . sodium chloride 20 mL/hr (04/26/11 0015)   PRN Meds:.acetaminophen, acetaminophen, LORazepam, LORazepam, morphine, oxyCODONE Assessment/Plan: Patient Active Hospital Problem List: Hematuria (02/25/2011)   Assessment: Pt presently on CBI and Dr. Patsi Sears instituting Carboprost protocol.   Plan: Will defer to urology.  HYPERTENSION (11/09/2006)   Assessment: BP adequate at present.   Personal history of alcoholism (07/31/2008)   Assessment: presently in withdrawal. CIWA protocol in place. May need to order [facility] for patient if he does not have an adequate response to Ativan.    Confusion (01/29/2011)   Assessment: At this point likely secondary to ETOH withdrawal.   Transfusion reaction chill fever type (04/26/2011)   Assessment: Pt does not appear to have had any hemolysis. Will attempt to transfuse a different unit of PRBC's given his ongoing blood loss from hematuria    Acute blood loss anemia (04/26/2011)   Assessment: Will transfuse 1 unit PRBC.      ETOH withdrawal (04/26/2011)   Assessment: CIWA-A protocol.  Abdominal pain (04/27/2011)   Assessment: Will obtain abdominal  X-ray to evaluate for acte abdominal process.    LOS: 1 day

## 2011-04-26 NOTE — Progress Notes (Signed)
Pt in process of receiving blood transfusion. Pt RR increasing gradually and O2 saturation decreasing. Pt also tachycardic. Dr. Delford Field (PCCM) asked to come to bedside to observe patient. Per Dr. Delford Field- DC blood transfusion and complete blood transfusion reaction protocol per [facility]. 4 L [place] applied to patient and 40 mg IV lasix given per Dr. Delford Field. Will continue to monitor patient.

## 2011-04-27 DIAGNOSIS — J811 Chronic pulmonary edema: Secondary | ICD-10-CM | POA: Diagnosis not present

## 2011-04-27 DIAGNOSIS — R319 Hematuria, unspecified: Secondary | ICD-10-CM

## 2011-04-27 DIAGNOSIS — B9620 Unspecified Escherichia coli [E. coli] as the cause of diseases classified elsewhere: Secondary | ICD-10-CM | POA: Diagnosis not present

## 2011-04-27 DIAGNOSIS — C61 Malignant neoplasm of prostate: Secondary | ICD-10-CM

## 2011-04-27 LAB — DIFFERENTIAL
Basophils Absolute: 0 10*3/uL (ref 0.0–0.1)
Eosinophils Absolute: 0 10*3/uL (ref 0.0–0.7)
Lymphocytes Relative: 5 % — ABNORMAL LOW (ref 12–46)
Neutro Abs: 16.7 10*3/uL — ABNORMAL HIGH (ref 1.7–7.7)

## 2011-04-27 LAB — URINE CULTURE
Colony Count: >=100000
Culture  Setup Time: 201304071138

## 2011-04-27 LAB — COMPREHENSIVE METABOLIC PANEL
ALT: 10 U/L (ref 0–53)
AST: 24 U/L (ref 0–37)
Alkaline Phosphatase: 89 U/L (ref 39–117)
CO2: 18 mEq/L — ABNORMAL LOW (ref 19–32)
Calcium: 7.7 mg/dL — ABNORMAL LOW (ref 8.4–10.5)
Chloride: 109 mEq/L (ref 96–112)
GFR calc Af Amer: 60 mL/min — ABNORMAL LOW (ref >90)
GFR calc non Af Amer: 52 mL/min — ABNORMAL LOW (ref >90)
Glucose, Bld: 121 mg/dL — ABNORMAL HIGH (ref 70–99)
Potassium: 3.6 mEq/L (ref 3.5–5.1)
Sodium: 138 mEq/L (ref 135–145)
Total Bilirubin: 0.5 mg/dL (ref 0.3–1.2)

## 2011-04-27 LAB — CBC
HCT: 20.5 % — ABNORMAL LOW (ref 39.0–52.0)
MCHC: 33.7 g/dL (ref 30.0–36.0)
Platelets: 209 10*3/uL (ref 150–400)
RDW: 14.9 % (ref 11.5–15.5)

## 2011-04-27 LAB — SAVE SMEAR

## 2011-04-27 LAB — PREPARE RBC (CROSSMATCH)

## 2011-04-27 LAB — TRANSFUSION REACTION

## 2011-04-27 MED ORDER — DEXTROSE 5 % IV SOLN
1.0000 g | INTRAVENOUS | Status: DC
  Administered 2011-04-27 – 2011-04-28 (×2): 1 g via INTRAVENOUS
  Filled 2011-04-27 (×3): qty 10

## 2011-04-27 MED ORDER — BIOTENE DRY MOUTH MT LIQD
15.0000 mL | Freq: Two times a day (BID) | OROMUCOSAL | Status: DC
  Administered 2011-04-27 – 2011-05-11 (×29): 15 mL via OROMUCOSAL

## 2011-04-27 MED ORDER — ACETAMINOPHEN 650 MG RE SUPP
RECTAL | Status: AC
  Filled 2011-04-27: qty 1

## 2011-04-27 MED ORDER — METHYLPREDNISOLONE SODIUM SUCC 125 MG IJ SOLR
60.0000 mg | Freq: Once | INTRAMUSCULAR | Status: AC
  Administered 2011-04-27: 60 mg via INTRAVENOUS
  Filled 2011-04-27: qty 2

## 2011-04-27 MED ORDER — METOPROLOL TARTRATE 1 MG/ML IV SOLN
5.0000 mg | INTRAVENOUS | Status: DC | PRN
  Administered 2011-04-27 – 2011-05-07 (×4): 5 mg via INTRAVENOUS
  Filled 2011-04-27 (×5): qty 5

## 2011-04-27 MED ORDER — DIPHENHYDRAMINE HCL 50 MG/ML IJ SOLN
INTRAMUSCULAR | Status: AC
  Filled 2011-04-27: qty 1

## 2011-04-27 MED ORDER — ACETAMINOPHEN 325 MG PO TABS
650.0000 mg | ORAL_TABLET | Freq: Once | ORAL | Status: DC
  Filled 2011-04-27: qty 2

## 2011-04-27 MED ORDER — MEPERIDINE HCL 25 MG/ML IJ SOLN: 25.0000 mg | Freq: Once | INTRAMUSCULAR | Status: DC

## 2011-04-27 MED ORDER — DIPHENHYDRAMINE HCL 50 MG PO CAPS: 50.0000 mg | ORAL_CAPSULE | Freq: Once | ORAL | Status: DC

## 2011-04-27 MED ORDER — MEPERIDINE HCL 50 MG/ML IJ SOLN
INTRAMUSCULAR | Status: AC
  Administered 2011-04-27: 25 mg via INTRAVENOUS
  Filled 2011-04-27: qty 1

## 2011-04-27 MED ORDER — SPIRITUS FRUMENTI
1.0000 | Freq: Three times a day (TID) | ORAL | Status: AC
  Administered 2011-04-27: 1 via ORAL
  Filled 2011-04-27: qty 1

## 2011-04-27 MED ORDER — DIPHENHYDRAMINE HCL 50 MG/ML IJ SOLN
25.0000 mg | Freq: Once | INTRAMUSCULAR | Status: AC
  Administered 2011-04-30: 25 mg via INTRAVENOUS
  Filled 2011-04-27: qty 1

## 2011-04-27 NOTE — Progress Notes (Signed)
CRITICAL VALUE ALERT  Critical value received:  Hgb 6.9  Date of notification:  04/27/11  Time of notification:  0400  Critical value read back:yes  Nurse who received alert:  M. Lashawne Dura  MD notified (1st page):  E. Bonno  Time of first page:  0410  MD notified (2nd page):  Time of second page:  Responding MD:  Delorise Shiner  Time MD responded:  [phone]

## 2011-04-27 NOTE — Progress Notes (Signed)
Subjective: Pt is a habitual drinker who underestimates his drinking and went into ETOH withdrawal last night. Dr Ashley Royalty spoke with Dr. Patsi Sears who knows this patient well and he reports that he usually fails the benzodiazapine protocols and typically requires Beer while hospitalized. Pt was  Awake earlier and reportedly yesterday and swinging at the nurses.   Today Patient sleeping, with some confusion and agitation per nursing with tacycardia and tachypnea. Patient recently received IV ativan. Transfusion reaction noted overnight. Objective: Filed Vitals:   04/27/11 0600 04/27/11 0700 04/27/11 0800 04/27/11 0817  BP: 97/55 111/61  111/61  Pulse:    150  Temp:   100.8 F (38.2 C) 99.9 F (37.7 C)  TempSrc:   Axillary   Resp: 29   35  Height:      Weight:      SpO2:    100%   Weight change:   Intake/Output Summary (Last 24 hours) at 04/27/11 0901 Last data filed at 04/27/11 0800  Gross per 24 hour  Intake  680.5 ml  Output   5250 ml  Net -4569.5 ml    General: Somnolent in no acute distress.  HEENT: [place]/AT PEERL, EOMI Neck: Trachea midline,  no masses, no thyromegal,y no JVD, no carotid bruit OROPHARYNX:  Moist, No exudate/ erythema/lesions.  Heart: Tachycardia He is without murmurs, rubs, gallops, PMI non-displaced, no heaves or thrills on palpation.  Lungs: Clear to auscultation, no wheezing or rhonchi noted. No increased vocal fremitus resonant to percussion. Decreased BS in bases.  Abdomen: distended and with some diffuse tenderness to palpation per facial expression. Positive bowel sounds, no masses no hepatosplenomegaly noted..  Musculoskeletal: No warm swelling or erythema around joints, no spinal tenderness noted. Skin: Warm and dry except over the knees which also displays some mottling over them.    Lab Results:  [facility] 04/27/11 0330 04/26/11 0420  NA 138 137  K 3.6 4.2  CL 109 109  CO2 18* 18*  GLUCOSE 121* 92  BUN 29* 25*  CREATININE 1.24 1.23    CALCIUM 7.7* 7.6*  MG -- 1.4*  PHOS -- --    Basename 04/27/11 0330  AST 24  ALT 10  ALKPHOS 89  BILITOT 0.5  PROT 6.4  ALBUMIN 2.5*   No results found for this basename: LIPASE:2,AMYLASE:2 in the last 72 hours  Basename 04/27/11 0330 04/26/11 0420  WBC 19.0* 16.7*  NEUTROABS 16.7* 15.4*  HGB 6.9* 7.2*  HCT 20.5* 21.5*  MCV 90.3 90.7  PLT 209 191    Basename 04/25/11 1725  CKTOTAL 67  CKMB 2.5  CKMBINDEX --  TROPONINI <0.30   No components found with this basename: POCBNP:3  Basename 04/25/11 1808  DDIMER 1.94*   No results found for this basename: HGBA1C:2 in the last 72 hours No results found for this basename: CHOL:2,HDL:2,LDLCALC:2,TRIG:2,CHOLHDL:2,LDLDIRECT:2 in the last 72 hours No results found for this basename: TSH,T4TOTAL,FREET3,T3FREE,THYROIDAB in the last 72 hours No results found for this basename: VITAMINB12:2,FOLATE:2,FERRITIN:2,TIBC:2,IRON:2,RETICCTPCT:2 in the last 72 hours  Micro Results: Recent Results (from the past 240 hour(s))  URINE CULTURE     Status: Normal   Collection Time   04/25/11  4:24 AM      Component Value Range Status Comment   Specimen Description URINE, CATHETERIZED   Final    Special Requests NONE   Final    Culture  Setup Time [id number]   Final    Colony Count >=100,000 COLONIES/ML   Final    Culture ESCHERICHIA COLI  Final    Report Status 04/27/2011 FINAL   Final    Organism ID, Bacteria ESCHERICHIA COLI   Final   MRSA PCR SCREENING     Status: Normal   Collection Time   04/25/11  6:54 PM      Component Value Range Status Comment   MRSA by PCR NEGATIVE  NEGATIVE  Final     Studies/Results: No results found.  Medications: I have reviewed the patient's current medications. Scheduled Meds:    . antiseptic oral rinse  15 mL Mouth Rinse BID  . cefTRIAXone (ROCEPHIN)  IV  1 g Intravenous Q24H  . clotrimazole   Topical BID  . folic acid  1 mg Oral Daily  . furosemide  40 mg Intravenous Once  . furosemide  40  mg Intravenous Q12H  . irbesartan  75 mg Oral Daily  . magnesium sulfate 1 - 4 g bolus IVPB  2 g Intravenous Once  . metoprolol succinate  25 mg Oral Daily  . mulitivitamin with minerals  1 tablet Oral Daily  . pantoprazole  80 mg Oral Q1200  . simvastatin  40 mg Oral QPC supper  . sodium chloride  3 mL Intravenous Q12H  . spiritus frumenti  1 each Oral 3 x daily with food  . thiamine  100 mg Oral Daily   Or  . thiamine  100 mg Intravenous Daily  . cyanocobalamin  100 mcg Oral Daily   Continuous Infusions:    . sodium chloride 20 mL/hr (04/26/11 0015)  . carboprost (HEMABATE) bladder irrigation - continuous 100 mL/hr at 04/26/11 1055   PRN Meds:.acetaminophen, acetaminophen, LORazepam, LORazepam, LORazepam, morphine, oxyCODONE Assessment/Plan: Patient Active Hospital Problem List: Hematuria (02/25/2011)   Assessment: Urine clearing up and slowly improving. Pt presently on CBI and Dr. Patsi Sears instituting Carboprost protocol.   Plan: Will defer to urology.  HYPERTENSION (11/09/2006)   Assessment: BP adequate at present.   Personal history of alcoholism (07/31/2008)   Assessment: presently in withdrawal. CIWA protocol in place. Will order Beer for patient if he becomes more alert and continue ativan CIWA protocol.    Confusion (01/29/2011)   Assessment: At this point likely secondary to ETOH withdrawal.   Transfusion reaction chill fever type (04/26/2011)   Assessment: Pt with 2 transfusion reactions, and HGB slowly drifting down, now at 6.9. Unable to transfuse at this time. Will check a LDH, retic count, save smear, indirect and direct coombs test. Per heme patient with positive coombs test. Will formlly consult with hematology for further eval and rxc.    Acute blood loss anemia (04/26/2011)   Assessment: Will transfuse 1 unit PRBC.  ECOLI UTI Will place on IV rocephin  Leukocytosis Likely secondary to UTI. IV Rocephin  Pulmonary edema Clinical improvement. Patient was  given 4 doses of IV lasix. Repeat CXR in AM follow.      ETOH withdrawal (04/26/2011)   Assessment: CIWA-A protocol.  Abdominal pain (04/27/2011)   Assessment: Will obtain repeat abdominal  X-ray in am.    LOS: 2 days

## 2011-04-27 NOTE — Progress Notes (Signed)
Subjective: Patient reports continued gross hematuria, without clots on Carboprost protocol ( prostate cancer with radiation prostatitis). Hgb low, but stable. ETOH withdrawal? Pt not awake. ( ativan).  Objective: Vital signs in last 24 hours: Temp:  [97.8 F (36.6 C)-101.1 F (38.4 C)] 100.8 F (38.2 C) (04/09 0800) Pulse Rate:  [30-124] 65  (04/09 0300) Resp:  [25-40] 29  (04/09 0600) BP: (97-180)/(44-100) 111/61 mmHg (04/09 0700) SpO2:  [85 %-100 %] 100 % (04/09 0300)A  Intake/Output from previous day: 04/08 0701 - 04/09 0700 In: 700.5 [I.V.:480; Blood:162.5; IV Piggyback:58] Out: 6200 [Urine:6200] Intake/Output this shift:    Past Medical History  Diagnosis Date  . ADENOCARCINOMA, PROSTATE   . ALLERGIC RHINITIS   . B12 DEFICIENCY   . BENIGN PROSTATIC HYPERTROPHY   . CAROTID ARTERY DISEASE   . CEREBROVASCULAR ACCIDENT, HX OF   . CORONARY ARTERY DISEASE   . DIVERTICULOSIS, COLON   . GERD   . HEMORRHOIDS   . HIATAL HERNIA   . HYPERLIPIDEMIA   . HYPERTENSION   . OSTEOPENIA   . PEPTIC ULCER DISEASE   . PERIPHERAL VASCULAR DISEASE   . Personal history of alcoholism   . PROSTATE CANCER, HX OF   . PUD, HX OF   . TRANSIENT ISCHEMIC ATTACK   . Shingles   . Anemia   . AS (aortic stenosis)   . S/P AVR (aortic valve replacement) 09/23/2010  . Complication of anesthesia 09/23/2010    very confused after waking up. Stopped drinking 40 days before this surgery.    Physical Exam:  General:thin male, spontaneously breathing, not awake.  Lungs - Normal respiratory effort, chest expands symmetrically.  Abdomen - Soft, non-tender & non-distended. GU:  Foley with CBI  Lab Results:  Basename 04/27/11 0330 04/26/11 0420 04/25/11 1808  WBC 19.0* 16.7* 8.2  HGB 6.9* 7.2* 8.4*  HCT 20.5* 21.5* 25.4*   BMET  Basename 04/27/11 0330 04/26/11 0420  NA 138 137  K 3.6 4.2  CL 109 109  CO2 18* 18*  GLUCOSE 121* 92  BUN 29* 25*  CREATININE 1.24 1.23  CALCIUM 7.7* 7.6*   No  results found for this basename: LABURIN:1 in the last 72 hours Results for orders placed during the hospital encounter of 04/25/11  URINE CULTURE     Status: Normal   Collection Time   04/25/11  4:24 AM      Component Value Range Status Comment   Specimen Description URINE, CATHETERIZED   Final    Special Requests NONE   Final    Culture  Setup Time [id number]   Final    Colony Count >=100,000 COLONIES/ML   Final    Culture ESCHERICHIA COLI   Final    Report Status 04/27/2011 FINAL   Final    Organism ID, Bacteria ESCHERICHIA COLI   Final   MRSA PCR SCREENING     Status: Normal   Collection Time   04/25/11  6:54 PM      Component Value Range Status Comment   MRSA by PCR NEGATIVE  NEGATIVE  Final     Studies/Results: @RISRSLT24 @  Assessment/Plan: Tachypnic and elevated HR, and Temp 99.9.  Urine c/s E. Coli ( has foley cath). Antibiotics per [facility]. Preliminary discussion with wife re: resuscitation. She does not appear to understand the severity of his problem.  HgB 6.9. Transfusion reaction x 2. Wife is asking for  Hematology consult-will pass on to [facility].  P: Will continue Carboprost protocol.  Antibiotics per [facility].  Omeed Osuna I 04/27/2011, 8:15 AM

## 2011-04-27 NOTE — Progress Notes (Signed)
Patient name: [name] Medical record number: [id number] Date of birth: October 11, 1926 Age: 78 y.o. Gender: male PCP: Oliver Barre, MD, MD  PCCM FU NOTE  Date: 04/27/2011 Reason for Consult: Acute transfusion reaction Referring Physician: Altha Harm, MD  History of Present Illness: 78 year old male with extensive PMH relevant for prostate cancer, CVA, CAD, HTN, PVD, AS post valve replacement. 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 Critical care consult called by Dr. Ashley Royalty for evaluation of an acute transfusion reaction. Per the notes and the nurse. He was receiving one unit of PRBC's and suddenly developed confusion, mottling of the skin, shaking and shivers, tachycardia, temperature of 101 and hypertension. Benadryl and hydrocortisone were administered with good response. There is no documentation of stridor, wheezing, upper airway swelling or hypoxemia. At the time of my examination the patient is asleep, breathing comfortably with mild tachycardia of 106/min, normotensive and saturating 100% on RA.  The patient is on  Ativan per CIWA scale for alcohol withdrawal. He is also  on morphine IV.  Lines/tubes : Urethral Catheter Triple-lumen 22 Fr. (Active)  Site Assessment Intact;Other (Comment) 04/25/2011  8:00 PM  Collection Container Standard drainage bag 04/25/2011  8:00 PM  Securement Method Leg strap 04/25/2011  8:00 PM  Urinary Catheter Interventions CBI 04/25/2011  8:00 PM  Output (mL) 1300 mL 04/25/2011  1:00 PM    Microbiology/Sepsis markers: 4/7 urine >> e. Coli S to ceftx  Abx ceftx 4/7 >>  Events: 4/8 transfusion reaction with fever, tachypnea, lasix overnight    Past Medical History  Diagnosis Date  . ADENOCARCINOMA, PROSTATE   . ALLERGIC RHINITIS   . B12 DEFICIENCY   . BENIGN PROSTATIC HYPERTROPHY   . CAROTID ARTERY DISEASE   . CEREBROVASCULAR ACCIDENT, HX OF   . CORONARY ARTERY DISEASE   . DIVERTICULOSIS, COLON   . GERD   .  HEMORRHOIDS   . HIATAL HERNIA   . HYPERLIPIDEMIA   . HYPERTENSION   . OSTEOPENIA   . PEPTIC ULCER DISEASE   . PERIPHERAL VASCULAR DISEASE   . Personal history of alcoholism   . PROSTATE CANCER, HX OF   . PUD, HX OF   . TRANSIENT ISCHEMIC ATTACK   . Shingles   . Anemia   . AS (aortic stenosis)   . S/P AVR (aortic valve replacement) 09/23/2010  . Complication of anesthesia 09/23/2010    very confused after waking up. Stopped drinking 40 days before this surgery.   confused this am, denies pain, received ativan last at 3 am  Vital signs for last 24 hours: Temp:  [97.8 F (36.6 C)-101.1 F (38.4 C)] 99.9 F (37.7 C) (04/09 0817) Pulse Rate:  [30-150] 150  (04/09 0817) Resp:  [25-40] 35  (04/09 0817) BP: (97-180)/(44-100) 111/61 mmHg (04/09 0817) SpO2:  [85 %-100 %] 100 % (04/09 0817)  Intake/Output this shift: Total I/O In: 20 [I.V.:20] Out: -850      Physical Exam:  General: int agitation,  in no apparent distress. Eyes: Anicteric sclerae. ENT: Oropharynx clear. Moist mucous membranes. No thrush Lymph: No cervical, supraclavicular, or axillary lymphadenopathy. Heart: Normal S1, S2. No murmurs, rubs, or gallops appreciated. No bruits, equal pulses. Lungs: Normal excursion, no dullness to percussion. Good air movement bilaterally, without wheezes or crackles. Normal upper airway sounds without evidence of stridor. Abdomen: Abdomen soft, non-tender and not distended, normoactive bowel sounds. No hepatosplenomegaly or masses. Musculoskeletal: No clubbing or synovitis. Skin: No rashes or lesions Neuro: Confused.  Tremors+ No focal neurologic deficits.  Patient able to protect his airway.   LAB RESULT Results for orders placed during the hospital encounter of 04/25/11 (from the past 24 hour(s))  PREPARE RBC (CROSSMATCH)     Status: Normal   Collection Time   04/26/11  1:30 PM      Component Value Range   Order Confirmation ORDER PROCESSED BY BLOOD BANK    TRANSFUSION REACTION      Status: Normal   Collection Time   04/26/11  3:48 PM      Component Value Range   Post RXN DAT IgG NEG     DAT C3 NEG     Path interp tx rxn       Value: NO EVIDENCE OF HEMOLYTIC TRANSFUSION REACTION PER DR. SMIR  COMPREHENSIVE METABOLIC PANEL     Status: Abnormal   Collection Time   04/27/11  3:30 AM      Component Value Range   Sodium 138  135 - 145 (mEq/L)   Potassium 3.6  3.5 - 5.1 (mEq/L)   Chloride 109  96 - 112 (mEq/L)   CO2 18 (*) 19 - 32 (mEq/L)   Glucose, Bld 121 (*) 70 - 99 (mg/dL)   BUN 29 (*) 6 - 23 (mg/dL)   Creatinine, Ser 7.82  0.50 - 1.35 (mg/dL)   Calcium 7.7 (*) 8.4 - 10.5 (mg/dL)   Total Protein 6.4  6.0 - 8.3 (g/dL)   Albumin 2.5 (*) 3.5 - 5.2 (g/dL)   AST 24  0 - 37 (U/L)   ALT 10  0 - 53 (U/L)   Alkaline Phosphatase 89  39 - 117 (U/L)   Total Bilirubin 0.5  0.3 - 1.2 (mg/dL)   GFR calc non Af Amer 52 (*) >90 (mL/min)   GFR calc Af Amer 60 (*) >90 (mL/min)  CBC     Status: Abnormal   Collection Time   04/27/11  3:30 AM      Component Value Range   WBC 19.0 (*) 4.0 - 10.5 (K/uL)   RBC 2.27 (*) 4.22 - 5.81 (MIL/uL)   Hemoglobin 6.9 (*) 13.0 - 17.0 (g/dL)   HCT 95.6 (*) 21.3 - 52.0 (%)   MCV 90.3  78.0 - 100.0 (fL)   MCH 30.4  26.0 - 34.0 (pg)   MCHC 33.7  30.0 - 36.0 (g/dL)   RDW 08.6  57.8 - 46.9 (%)   Platelets 209  150 - 400 (K/uL)  DIFFERENTIAL     Status: Abnormal   Collection Time   04/27/11  3:30 AM      Component Value Range   Neutrophils Relative 88 (*) 43 - 77 (%)   Lymphocytes Relative 5 (*) 12 - 46 (%)   Monocytes Relative 7  3 - 12 (%)   Eosinophils Relative 0  0 - 5 (%)   Basophils Relative 0  0 - 1 (%)   Neutro Abs 16.7 (*) 1.7 - 7.7 (K/uL)   Lymphs Abs 1.0  0.7 - 4.0 (K/uL)   Monocytes Absolute 1.3 (*) 0.1 - 1.0 (K/uL)   Eosinophils Absolute 0.0  0.0 - 0.7 (K/uL)   Basophils Absolute 0.0  0.0 - 0.1 (K/uL)   WBC Morphology INCREASED BANDS (>20% BANDS)       Assessment:  78 year old male with extensive PMH. Admitted with gross  hematuria secondary to radiation cystitis. Developed fever, chills, shivering and confusional state 4/7 associated to a PRBC transfusion.  His symptoms certainly are compatible  with an acute febrile non hemolytic reaction. He had good response to Benadryl and hydrocortisone. 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 Goals of care discussion appropriate SD status   Broadus Costilla V.  04/27/2011

## 2011-04-27 NOTE — H&P (Signed)
Called for Hgb 6.9 in this gentleman who had a blood transfusion reaction overnight for whom critical care was consulted, transfusion stopped. Last Hgb was 7.2, pt was stable per discussion with nurse. Will hold on further transfusion for now, monitor, will sign this out to am team

## 2011-04-27 NOTE — Progress Notes (Signed)
Change all meds to IV due to fluctuating mental status  - high aspiration risk  Mayara Paulson V.

## 2011-04-27 NOTE — Consult Note (Signed)
Patient examined. Chart reviewed in detail.  Blood bank data reviewed and discussed with blood bank technicians. Full note to follow. Impression: Critically ill man 7 months post tissue aortic valve replacement and single vessel CABG, 4 months post folgoration of an arterial bleeder in the bladder felt result of radiation cystitis which has developed post RT for PSA recurrence of bladder ca, admitted 4/7 with recurrent hematuria, Hemoglobin 7.2. At time of his initial blood transfusion on April 7 he developed confusion, decreased responsiveness, mottling of his lower extremities, severe rigors, rectal temperature of 101, tachycardia with pulse 131 with stable blood pressure 162/80. He was given Benadryl and hydrocortisone and the reaction subsided. A second attempt at transfusion was made yesterday April 8. Once again there was a change in his mental status halfway through the first unit. Increased respiratory rate. Decreased oxygen saturation to 85%. Transient rise in blood pressure up to 180/94. Persistent tachycardia unchanged from baseline at 111. He did develop a fever but not until 12 hours after the transfusion when temperature was 101. A chest radiograph showed early pulmonary edema. Transfusion was aborted. He was given oxygen and lasix. Once again the reaction subsided. A transfusion evaluation was done by the blood bank. Blood type is O Rh- no autoantibodies or alloantibodies were detected. No evidence of a acute or delayed hemolytic transfusion reaction. Serum bilirubin is normal at 0.5. LDH and reticulocyte count are pending.  Exam: He appears critically ill at this time. He is poorly responsive. He is tachycardic, cachectic, blood pressure remains elevated. He appears to be in rapid atrial fibrillation. Extremities are cyanotic. There is clubbing. He was given a dose of diuretic this morning. Lungs are currently clear. Heart rate is rapid and irregularly irregular at 154 per minute.  Respirations 30-40 per minute on oxygen. No edema.  Impression: No evidence for a major hemolytic transfusion reaction. I cannot exclude the possibility of TRALI, transfusion related lung injury, at this time but it seems more likely that we are just seeing volume overload in a man with poor cardiac reserve to explain the acute symptoms of increased dyspnea, tachycardia, and hypoxia, when transfusion is initiated.  Recommendation: I think it is safe to proceed with additional transfusion if needed. I will order  premedication to include Tylenol 650 mg by mouth, Benadryl 50 mg by mouth, Demerol 25 mg IV, and Solu-Medrol 60 mg IV. Discussed with critical care attending and patient's wife.

## 2011-04-28 ENCOUNTER — Inpatient Hospital Stay (HOSPITAL_COMMUNITY): Payer: Medicare Other

## 2011-04-28 ENCOUNTER — Encounter (HOSPITAL_COMMUNITY): Payer: Self-pay | Admitting: Oncology

## 2011-04-28 DIAGNOSIS — I4891 Unspecified atrial fibrillation: Secondary | ICD-10-CM

## 2011-04-28 DIAGNOSIS — K560 Paralytic ileus: Secondary | ICD-10-CM | POA: Diagnosis not present

## 2011-04-28 DIAGNOSIS — I359 Nonrheumatic aortic valve disorder, unspecified: Secondary | ICD-10-CM

## 2011-04-28 DIAGNOSIS — N3090 Cystitis, unspecified without hematuria: Secondary | ICD-10-CM

## 2011-04-28 DIAGNOSIS — R5084 Febrile nonhemolytic transfusion reaction: Secondary | ICD-10-CM

## 2011-04-28 DIAGNOSIS — N3091 Cystitis, unspecified with hematuria: Secondary | ICD-10-CM

## 2011-04-28 DIAGNOSIS — J810 Acute pulmonary edema: Secondary | ICD-10-CM

## 2011-04-28 HISTORY — DX: Febrile nonhemolytic transfusion reaction: R50.84

## 2011-04-28 HISTORY — DX: Cystitis, unspecified with hematuria: N30.91

## 2011-04-28 LAB — COMPREHENSIVE METABOLIC PANEL
Alkaline Phosphatase: 95 U/L (ref 39–117)
BUN: 36 mg/dL — ABNORMAL HIGH (ref 6–23)
Chloride: 110 mEq/L (ref 96–112)
GFR calc Af Amer: 54 mL/min — ABNORMAL LOW (ref >90)
Glucose, Bld: 152 mg/dL — ABNORMAL HIGH (ref 70–99)
Potassium: 3.3 mEq/L — ABNORMAL LOW (ref 3.5–5.1)
Total Bilirubin: 0.5 mg/dL (ref 0.3–1.2)

## 2011-04-28 LAB — TYPE AND SCREEN
ABO/RH(D): O NEG
Antibody Screen: NEGATIVE
Unit division: 0

## 2011-04-28 LAB — DIFFERENTIAL
Basophils Relative: 0 % (ref 0–1)
Eosinophils Absolute: 0 10*3/uL (ref 0.0–0.7)
Neutrophils Relative %: 89 % — ABNORMAL HIGH (ref 43–77)

## 2011-04-28 LAB — CBC
HCT: 24.2 % — ABNORMAL LOW (ref 39.0–52.0)
Hemoglobin: 8.2 g/dL — ABNORMAL LOW (ref 13.0–17.0)
WBC: 11.4 10*3/uL — ABNORMAL HIGH (ref 4.0–10.5)

## 2011-04-28 LAB — MAGNESIUM: Magnesium: 2.3 mg/dL (ref 1.5–2.5)

## 2011-04-28 LAB — CARDIAC PANEL(CRET KIN+CKTOT+MB+TROPI): Total CK: 271 U/L — ABNORMAL HIGH (ref 7–232)

## 2011-04-28 MED ORDER — DILTIAZEM HCL 100 MG IV SOLR
5.0000 mg/h | INTRAVENOUS | Status: DC
  Administered 2011-04-28: 15 mg/h via INTRAVENOUS
  Administered 2011-04-28: 5 mg/h via INTRAVENOUS
  Administered 2011-04-29 (×2): 15 mg/h via INTRAVENOUS

## 2011-04-28 MED ORDER — POTASSIUM CHLORIDE CRYS ER 20 MEQ PO TBCR: 40.0000 meq | EXTENDED_RELEASE_TABLET | Freq: Once | ORAL | Status: DC

## 2011-04-28 MED ORDER — METOPROLOL TARTRATE 1 MG/ML IV SOLN
5.0000 mg | Freq: Once | INTRAVENOUS | Status: DC
  Filled 2011-04-28: qty 5

## 2011-04-28 MED ORDER — FUROSEMIDE 10 MG/ML IJ SOLN
40.0000 mg | Freq: Two times a day (BID) | INTRAMUSCULAR | Status: DC
  Administered 2011-04-28 – 2011-04-29 (×2): 40 mg via INTRAVENOUS
  Filled 2011-04-28 (×4): qty 4

## 2011-04-28 MED ORDER — POTASSIUM CHLORIDE 10 MEQ/100ML IV SOLN
10.0000 meq | INTRAVENOUS | Status: AC
  Administered 2011-04-28 (×4): 10 meq via INTRAVENOUS
  Filled 2011-04-28 (×4): qty 100

## 2011-04-28 MED ORDER — HALOPERIDOL LACTATE 5 MG/ML IJ SOLN
2.0000 mg | Freq: Once | INTRAMUSCULAR | Status: AC
  Administered 2011-04-28: 23:00:00 via INTRAVENOUS
  Filled 2011-04-28 (×2): qty 1

## 2011-04-28 NOTE — Progress Notes (Addendum)
Remains agitated on CIWA protocol. Ativan 2mg  iv and morphine 2mg  iv given for anxiety and impulsive behavior, attempting to get out of bed and pull out lines and monitor. Remains on continuous bladder irrigation with blood tinged urine, cardizem drip at 15mg /hr for tachycardia and irregular rhythm. Safety sitter at bedside. Metoprolol 5 mg ivp for heart rate up to 160.

## 2011-04-28 NOTE — Progress Notes (Signed)
He had no reaction to transfusion given with appropriate premedication. Hemoglobin today is 8.2. Impression: Minor transfusion reaction non-hemolytic volume overload versus TRALI circumvented by use of pre-medication and a slow infusion of red cells. I will sign off

## 2011-04-28 NOTE — Consult Note (Signed)
CARDIOLOGY CONSULT NOTE   Patient ID: CHAVEZ ROSOL MRN: [id number] DOB/AGE: [birth year] 78 y.o.  Admit date: 04/25/2011  Primary Physician   Oliver Barre, MD, MD Primary Cardiologist   [facility] Reason for Consultation   Pulmonary Edema  WUJ:WJXB E Shampine is a 78 y.o. male with a history of OHS/AoVR with CABG September 2012. 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 He had anemia requiring transfusions, then had a transfusion reaction which was treated. He had confusion and agitation, requiring sedation, and was also going through ETOH withdrawal.  He was seen by hematology as well. He was noted to have worsening pulmonary edema on CXR and cardiology was asked to evaluate him. The patient has received sedation. His wife is present. He is not able to answer questions and per the nursing staff, is very agitated unless sedated. His wife states he has not complained of chest pain recently but did get her to feel his heart because he stated it felt different, but did not say why.   Past Medical History  Diagnosis Date  . ADENOCARCINOMA, PROSTATE   . ALLERGIC RHINITIS   . B12 DEFICIENCY   . BENIGN PROSTATIC HYPERTROPHY   . CAROTID ARTERY DISEASE   . CEREBROVASCULAR ACCIDENT, HX OF   . CORONARY ARTERY DISEASE   . DIVERTICULOSIS, COLON   . GERD   . HEMORRHOIDS   . HIATAL HERNIA   . HYPERLIPIDEMIA   . HYPERTENSION   . OSTEOPENIA   . PEPTIC ULCER DISEASE   . PERIPHERAL VASCULAR DISEASE   . Personal history of alcoholism   . PROSTATE CANCER, HX OF   . PUD, HX OF   . TRANSIENT ISCHEMIC ATTACK   . Shingles   . Anemia   . AS (aortic stenosis)   . S/P AVR (aortic valve replacement) 09/23/2010  . Complication of anesthesia 09/23/2010    very confused after waking up. Stopped drinking 40 days before this surgery.  . Transfusion reaction, chill fever type 04/28/2011    Fever, rigors, tachycardia,tachypnea but no evidence  of hemolysis  . Hemorrhagic cystitis 04/28/2011    Due to previous radiation for PSA recurrence of prostate cancer     Past Surgical History  Procedure Date  . Popliteal synovial cyst excision   . Right carotid endarterectomy 1999    Left carotid total chronic occlusion  . Prostate surgery   . Cardic stent 2004  . Aortic valve replacement 09/23/2010    #53mm [facility] Ease pericardial tissue valve  . Coronary artery bypass graft 09/23/2010    CABG x1 using LIMA to LAD  . Cystoscopy with biopsy 02/15/2011    Procedure: CYSTOSCOPY WITH BIOPSY;  Surgeon: Anner Crete, MD;  Location: [facility];  Service: Urology;  Laterality: N/A;  Cystoscopy, biopsy and fulguration of the bladder    Allergies  Allergen Reactions  . Tolterodine Tartrate     I have reviewed the patient's current medications    . acetaminophen      . acetaminophen  650 mg Oral Once  . antiseptic oral rinse  15 mL Mouth Rinse BID  . cefTRIAXone (ROCEPHIN)  IV  1 g Intravenous Q24H  . clotrimazole   Topical BID  . diphenhydrAMINE      . diphenhydrAMINE  25 mg Intravenous Once  . folic acid  1 mg Oral Daily  . furosemide  40 mg Intravenous Q12H  . meperidine      .  meperidine  25 mg Intravenous Once  . methylPREDNISolone (SOLU-MEDROL) injection  60 mg Intravenous Once  . mulitivitamin with minerals  1 tablet Oral Daily  . potassium chloride  10 mEq Intravenous Q1 Hr x 4  . sodium chloride  3 mL Intravenous Q12H  . spiritus frumenti  1 each Oral 3 x daily with food  . thiamine  100 mg Intravenous Daily  . DISCONTD: potassium chloride  40 mEq Oral Once      . sodium chloride 20 mL/hr (04/26/11 0015)  . carboprost (HEMABATE) bladder irrigation - continuous 100 mL/hr at 04/28/11 0956   acetaminophen, acetaminophen, LORazepam, LORazepam, LORazepam, metoprolol, morphine, oxyCODONE  Prescriptions prior to admission  Medication Sig Dispense Refill  . atorvastatin (LIPITOR) 20 MG tablet Take 1 tablet (20 mg  total) by mouth daily.  90 tablet  2  . clotrimazole-betamethasone (LOTRISONE) cream Apply 1 application topically 2 (two) times daily as needed. For rash on/around buttocks.      . cyanocobalamin 100 MCG tablet Take 100 mcg by mouth daily.      . metoprolol succinate (TOPROL-XL) 25 MG 24 hr tablet Take 1 tablet (25 mg total) by mouth daily.  90 tablet  2  . Multiple Vitamin (MULITIVITAMIN WITH MINERALS) TABS Take 1 tablet by mouth daily.      Marland Kitchen omeprazole (PRILOSEC) 40 MG capsule Take 40 mg by mouth daily.        . valsartan (DIOVAN) 80 MG tablet Take 1 tablet (80 mg total) by mouth daily.  90 tablet  2      History   Social History  . Marital Status: Married    Spouse Name: N/A    Number of Children: N/A  . Years of Education: N/A   Occupational History  . Not on file.   Social History Main Topics  . Smoking status: Former Smoker    Quit date: 02/14/1958  . Smokeless tobacco: Not on file  . Alcohol Use: 21.0 oz/week    7 Glasses of wine, 28 Cans of beer per week  . Drug Use: No  . Sexually Active:    Other Topics Concern  . Not on file   Social History Narrative  . No narrative on file     Family History  Problem Relation Age of Onset  . Colon cancer Mother 15  . Diabetes Sister   . Hypertension Sister   . Coronary artery disease Sister   . Multiple sclerosis Sister   . Stroke Brother      ROS:  Full 14 point review of systems complete and found to be negative unless listed above.  Physical Exam: Blood pressure 173/78, pulse 44, temperature 97.5 F (36.4 C), temperature source Oral, resp. rate 29, height 5\' 11"  (1.803 m), weight 186 lb 15.2 oz (84.8 kg), SpO2 98.00%.  General: Well developed, frail elderly male in no acute distress but is sedated Head: Eyes PERRLA, No xanthomas.   Normocephalic and atraumatic, oropharynx without edema or exudate. Dentition poor Lungs: Bilateral basilar crackles  Heart:  Heart irregular and rapid rate and rhythm with S1, S2, 2/6   murmur. pulses are 2+ all 4 extrem.   Neck: No carotid bruit. No lymphadenopathy.  JVD elevated at 10 cm. Abdomen: Bowel sounds present, abdomen soft and non-tender without masses or hernias noted. Msk:  No spine or cva tenderness. No weakness, no joint deformities or effusions. Extremities: No clubbing or cyanosis. No edema.  Neuro: Sedated and moves all extrem when roused but  not able to answer questions. Responds to his name. No focal deficits noted. Psych:  Sedated Skin: No rashes or lesions noted.  Labs:   Lab Results  Component Value Date   WBC 11.4* 04/28/2011   HGB 8.2* 04/28/2011   HCT 24.2* 04/28/2011   MCV 89.0 04/28/2011   PLT 241 04/28/2011    Basename 04/25/11 1808  INR 1.07    Lab 04/28/11 0335  NA 141  K 3.3*  CL 110  CO2 19  BUN 36*  CREATININE 1.36*  CALCIUM 7.9*  PROT 6.9  BILITOT 0.5  ALKPHOS 95  ALT 13  AST 27  GLUCOSE 152*    Basename 04/28/11 1002 04/25/11 1725  CKTOTAL 262* 67  CKMB 4.4* 2.5  TROPONINI 0.40* <0.30   Pro B Natriuretic peptide (BNP)  Date/Time Value Range Status  04/28/2011 10:02 AM 10058.0* 0-450 (pg/mL) Final   Lab Results  Component Value Date   DDIMER 1.94* 04/25/2011     Retic Ct Pct  Date/Time Value Range Status  04/27/2011  3:30 AM 4.2* 0.4-3.1 (%) Final    Echo: 01/27/2010 Study Conclusions - Left ventricle: The cavity size was normal. Wall thickness was increased in a pattern of mild LVH. Systolic function was normal. The estimated ejection fraction was in the range of 55% to 60%. Wall motion was normal; there were no regional wall motion abnormalities. Features are consistent with a pseudonormal left ventricular filling pattern, with concomitant abnormal relaxation and increased filling pressure (grade 2 diastolic dysfunction). Doppler parameters are consistent with high ventricular filling pressure. - Aortic valve: There was critical stenosis. Mild regurgitation. Valve area: 0.58cm^2(VTI). Valve area:  0.56cm^2 (Vmax). - Mitral valve: Calcified annulus. Mild regurgitation. - Left atrium: The atrium was moderately dilated. - Pulmonary arteries: Systolic pressure was moderately increased. PA peak pressure: 54mm Hg (S).  ECG:  25-Apr-2011 16:57:12  Atrial fibrillation with rapid ventricular response Nonspecific ST and T wave abnormality , probably digitalis effect the patient has converted to A-Fib since previous tracing Vent. rate 135 BPM PR interval * ms QRS duration 72 ms QT/QTc 314/471 ms P-R-T axes * -14 88  Radiology:   Dg Chest Port 1 View 04/26/2011  *RADIOLOGY REPORT*  Clinical Data: Respiratory distress post transfusion.  PORTABLE CHEST - 1 VIEW  Comparison: 02/17/2011 and 11/02/2010.  Findings: 1558 hours.  There are progressively lower lung volumes with increased interstitial markings throughout both lungs suspicious for edema.  No significant pleural effusion or confluent airspace opacity is identified.  The heart size and mediastinal contours are stable status post aortic valve replacement.  No acute osseous findings are seen.  IMPRESSION: Progressively increased interstitial markings suspicious for pulmonary edema.  No confluent airspace opacity.  Original Report Authenticated By: Gerrianne Scale, M.D.   Dg Abd Acute W/chest 04/28/2011  *RADIOLOGY REPORT*  Clinical Data: Abdominal pain.  Follow up of pulmonary edema.  ACUTE ABDOMEN SERIES (ABDOMEN 2 VIEW & CHEST 1 VIEW)  Comparison: Chest film 04/26/2011.  Plain film abdomen 04/26/2011.  Findings: Frontal view of the chest demonstrates midline trachea. Normal heart size.  Right costophrenic angle minimally excluded. No pleural effusion or pneumothorax.  Increasing moderate interstitial edema.  Patchy right greater than left bibasilar airspace disease is progressive. Prior median sternotomy.  Abominal films demonstrate no free intraperitoneal air on decubitus positioning.  Minimal air fluid levels within the small bowel loops.   Motion degraded abdominal films.  Supine view demonstrates gas filled small bowel loops which measure up to 3.1 cm.  Pelvis excluded from the supine film.  IMPRESSION:  1.  Progressive congestive heart failure. 2.  Developing bibasilar airspace disease, which could represent atelectasis or concurrent infection. 3.  Nonspecific bowel gas pattern.  New borderline small bowel dilatation with minimal air fluid levels.  Question adynamic ileus versus less likely  low grade partial small bowel obstruction.  Original Report Authenticated By: Consuello Bossier, M.D.    ASSESSMENT AND PLAN:   The patient was seen today by Dr Shirlee Latch, the patient evaluated and the data reviewed.   1. Pulmonary Edema  - echo has been done, results pending. His I/Os are negative by > 4 liters daily on Lasix 40 mg IV BID. MD advise on dose changes. He has a history of diastolic dysfunction. His potassium has been supplemented   2. Atrial fib - RVR since admit, New diagnosis, not on his home dose of Toprol XL 25 mg. It may help his volume status if he gets some rate control, consider metoprolol 2.5 mg IV q 4 hours and follow HR/BP as he is not taking POs well.  3. Anticoag - none right now secondary to hematuria  Active Problems - per primary MD  HYPERTENSION  Personal history of alcoholism  Confusion  Hematuria  Transfusion reaction chill fever type  Acute blood loss anemia  E-coli UTI  Transfusion reaction, chill fever type  Hemorrhagic cystitis  Adynamic ileus   Signed: Theodore Demark 04/28/2011, 11:40 AM Co-Sign MD  78 yo with history CAD s/p CABG/bioprosthetic AVR in 9/12 presented with hematuria thought 2/2 radiation cystitis.  In the hospital, he developed progressive confusion thought to be related to ETOH withdrawal.  He has been noted to be volume overloaded with pulmonary edema on CXR.  He has been in atrial fibrillation here in the hospital.  This has not been noted in the past.  Currently, in RVR with HR  130s-140s.  1. Atrial fibrillation: New diagnosis.  Has developed in setting of ETOH withdrawal.  He is not a candidate for anticoagulation with active hematuria requiring transfusion.  He is NPO because of altered mental status.  - Diltiazem gtt for now.  If EF is decreased significantly on echo, would consider transition to metoprolol IV boluses.  2. CHF: Prior EF was normal, so appears to be acute on chronic diastolic CHF precipitated perhaps by afib with RVR.  He is volume overloaded on exam with elevated JVP and pulmonary edema on CXR.  Would continue Lasix at 40 mg IV bid for now.  Awaiting echo.  3. Elevated troponin: Suspect this is most likely demand ischemia from CHF with volume overload and afib with RVR.  TnI is mildly elevated.  Will repeat to make sure there is no significant upwards trend.  No chest pain.   Marca Ancona 04/28/2011 1:35 PM

## 2011-04-28 NOTE — Progress Notes (Signed)
  Echocardiogram 2D Echocardiogram has been performed.  Cathie Beams Deneen 04/28/2011, 12:04 PM

## 2011-04-28 NOTE — Progress Notes (Signed)
Subjective: Pt is a habitual drinker who underestimates his drinking and went into ETOH withdrawal last night. Dr Ashley Royalty spoke with Dr. Patsi Sears who knows this patient well and he reports that he usually fails the benzodiazapine protocols and typically requires Beer while hospitalized. Pt was  Awake earlier and reportedly yesterday and swinging at the nurses.   Today Patient sleeping, with some confusion and agitation per nursing . Patient recently received IV ativan. Patient s/p transfusion yesterday with premedication with no reaction. No BM noted per nursing. Objective: Filed Vitals:   04/28/11 0200 04/28/11 0400 04/28/11 0600 04/28/11 0800  BP: 143/72 164/71 134/58 146/75  Pulse: 100 109 111 63  Temp:  97 F (36.1 C)    TempSrc:  Axillary    Resp: 20 24 22 29   Height:      Weight:      SpO2: 98% 94% 98% 93%   Weight change:   Intake/Output Summary (Last 24 hours) at 04/28/11 0903 Last data filed at 04/28/11 0800  Gross per 24 hour  Intake   1313 ml  Output   6795 ml  Net  -5482 ml    General: Somnolent in no acute distress.  HEENT: [place]/AT PEERL, EOMI Neck: Trachea midline,  no masses, no thyromegal,y no JVD, no carotid bruit Heart: Tachycardia He is without murmurs, rubs, gallops, PMI non-displaced, no heaves or thrills on palpation.  Lungs: Right basilar crackles, Decreased BS in bases.  Abdomen: diffuse tenderness to palpation per facial expression. No BS, no masses no hepatosplenomegaly noted..  Musculoskeletal: No warm swelling or erythema around joints, no spinal tenderness noted. Ext: NO c/c/e    Lab Results:  Basename 04/28/11 0335 04/27/11 0330 04/26/11 0420  NA 141 138 --  K 3.3* 3.6 --  CL 110 109 --  CO2 19 18* --  GLUCOSE 152* 121* --  BUN 36* 29* --  CREATININE 1.36* 1.24 --  CALCIUM 7.9* 7.7* --  MG -- -- 1.4*  PHOS -- -- --    Basename 04/28/11 0335 04/27/11 0330  AST 27 24  ALT 13 10  ALKPHOS 95 89  BILITOT 0.5 0.5  PROT 6.9 6.4    ALBUMIN 2.5* 2.5*   No results found for this basename: LIPASE:2,AMYLASE:2 in the last 72 hours  Basename 04/28/11 0335 04/27/11 0330  WBC 11.4* 19.0*  NEUTROABS 10.2* 16.7*  HGB 8.2* 6.9*  HCT 24.2* 20.5*  MCV 89.0 90.3  PLT 241 209    Basename 04/25/11 1725  CKTOTAL 67  CKMB 2.5  CKMBINDEX --  TROPONINI <0.30   No components found with this basename: POCBNP:3  Basename 04/25/11 1808  DDIMER 1.94*   No results found for this basename: HGBA1C:2 in the last 72 hours No results found for this basename: CHOL:2,HDL:2,LDLCALC:2,TRIG:2,CHOLHDL:2,LDLDIRECT:2 in the last 72 hours No results found for this basename: TSH,T4TOTAL,FREET3,T3FREE,THYROIDAB in the last 72 hours  Basename 04/27/11 0330  VITAMINB12 --  FOLATE --  FERRITIN --  TIBC --  IRON --  RETICCTPCT 4.2*    Micro Results: Recent Results (from the past 240 hour(s))  URINE CULTURE     Status: Normal   Collection Time   04/25/11  4:24 AM      Component Value Range Status Comment   Specimen Description URINE, CATHETERIZED   Final    Special Requests NONE   Final    Culture  Setup Time [id number]   Final    Colony Count >=100,000 COLONIES/ML   Final    Culture ESCHERICHIA COLI  Final    Report Status 04/27/2011 FINAL   Final    Organism ID, Bacteria ESCHERICHIA COLI   Final   MRSA PCR SCREENING     Status: Normal   Collection Time   04/25/11  6:54 PM      Component Value Range Status Comment   MRSA by PCR NEGATIVE  NEGATIVE  Final     Studies/Results: No results found.  Medications: I have reviewed the patient's current medications. Scheduled Meds:    . acetaminophen      . acetaminophen  650 mg Oral Once  . antiseptic oral rinse  15 mL Mouth Rinse BID  . cefTRIAXone (ROCEPHIN)  IV  1 g Intravenous Q24H  . clotrimazole   Topical BID  . diphenhydrAMINE      . diphenhydrAMINE  25 mg Intravenous Once  . folic acid  1 mg Oral Daily  . furosemide  40 mg Intravenous Q12H  . meperidine      .  meperidine  25 mg Intravenous Once  . methylPREDNISolone (SOLU-MEDROL) injection  60 mg Intravenous Once  . mulitivitamin with minerals  1 tablet Oral Daily  . potassium chloride  10 mEq Intravenous Q1 Hr x 4  . sodium chloride  3 mL Intravenous Q12H  . spiritus frumenti  1 each Oral 3 x daily with food  . thiamine  100 mg Intravenous Daily  . DISCONTD: diphenhydrAMINE  50 mg Oral Once  . DISCONTD: irbesartan  75 mg Oral Daily  . DISCONTD: metoprolol succinate  25 mg Oral Daily  . DISCONTD: pantoprazole  80 mg Oral Q1200  . DISCONTD: potassium chloride  40 mEq Oral Once  . DISCONTD: simvastatin  40 mg Oral QPC supper  . DISCONTD: thiamine  100 mg Oral Daily  . DISCONTD: cyanocobalamin  100 mcg Oral Daily   Continuous Infusions:    . sodium chloride 20 mL/hr (04/26/11 0015)  . carboprost (HEMABATE) bladder irrigation - continuous 100 mL/hr at 04/27/11 1101   PRN Meds:.acetaminophen, acetaminophen, LORazepam, LORazepam, LORazepam, metoprolol, morphine, oxyCODONE Assessment/Plan: Patient Active Hospital Problem List: Hematuria (02/25/2011)   Assessment: Urine clearing up and slowly improving. Pt presently on CBI and Dr. Patsi Sears instituting Carboprost protocol.   Plan: Will defer to urology.  HYPERTENSION (11/09/2006)   Assessment: BP adequate at present.   Personal history of alcoholism (07/31/2008)   Assessment: presently in withdrawal. CIWA protocol in place.     Confusion (01/29/2011)   Assessment: At this point likely secondary to ETOH withdrawal.   Transfusion reaction chill fever type (04/26/2011)   Assessment: Pt with 2 transfusion reaction. Patient seen by hematology and no autoantibodies or alloantibodies detected. Patient premedicated and transfused yesterday without any reaction. Hgb 8.2. Appreciate hematology input and rxcs.    Acute blood loss anemia (04/26/2011)   Assessment: s/p 1 unit PRBC with premedication without reaction.  ECOLI UTI Will place on IV rocephin  D2  Leukocytosis Likely secondary to UTI. Leukocytosis trending down. Continue IV Rocephin  Pulmonary edema Repeat CXR with worsening edema.  Cycle cardiac enzymes. Check 2 d echo. Continue IV lasix follow.  Adynamic ileus Will make NPO.  Check magnesium. Replete electrolytes. Serial xrays.      ETOH withdrawal (04/26/2011)   Assessment: CIWA-A protocol.  Abdominal pain (04/27/2011)   Assessment:  Likely secondary to ileus. Serial films.    LOS: 3 days

## 2011-04-28 NOTE — Consult Note (Signed)
Addendum to 04/27/11/ consult: Additional data obtained with respect to diagnosis and treatment of his prostate cancer. He was diagnosed with a preoperative Gleason 3+4 adenocarcinoma of the prostate at age 78 in September 2001 and underwent a radical retropubic prostatectomy by Dr. Patsi Sears on 09/28/1999. Final pathology [id number] Gleason 4+4 with foci of extracapsular extension but negative for involvement of seminal vesicles or lymph nodes. He had an isolated PSA recurrence in October of 2006. He was treated with external beam radiation to a total tumor dose of 6480 cGy in 36 fractions between 01/28/2005 in 03/18/2005 by Dr. Antony Blackbird.  He presented with intermittent gross hematuria in January of this year. He underwent cystoscopy with evacuation of blood clots with findings of an arterial bleeder at the trigone. No tumors or stones were seen in the bladder. There were changes of previous radiation the arterial bleeder was fulgurated.

## 2011-04-28 NOTE — Progress Notes (Signed)
Patient name: ZACKREY DYAR Medical record number: [id number] Date of birth: 12-03-1926 Age: 78 y.o. Gender: male PCP: Oliver Barre, MD, MD  PCCM FU NOTE  Date: 04/28/2011 Reason for Consult: Acute transfusion reaction Referring Physician: Altha Harm, MD  History of Present Illness: 78 year old male with extensive PMH relevant for prostate cancer, active ETOH use, CVA, CAD, HTN, PVD, AS post valve replacement. 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 Critical care consult called by Dr. Ashley Royalty on 4/7 for evaluation of high grade fevers, chills , tachypnea during transfusion , recurred 4/8 >> attributed to minor non hemolytic reaction, tolerated transfusion 4/9 with pre-medication. Course complicated by ETOH withdrawal, e.coli UTI  & RVR Lines/tubes : Microbiology/Sepsis markers: 4/7 urine >> e. Coli S to ceftx  Abx ceftx 4/7 >>  Events: 4/8 transfusion reaction with fever, tachypnea, lasix overnight    Past Medical History  Diagnosis Date  . ADENOCARCINOMA, PROSTATE   . ALLERGIC RHINITIS   . B12 DEFICIENCY   . BENIGN PROSTATIC HYPERTROPHY   . CAROTID ARTERY DISEASE   . CEREBROVASCULAR ACCIDENT, HX OF   . CORONARY ARTERY DISEASE   . DIVERTICULOSIS, COLON   . GERD   . HEMORRHOIDS   . HIATAL HERNIA   . HYPERLIPIDEMIA   . HYPERTENSION   . OSTEOPENIA   . PEPTIC ULCER DISEASE   . PERIPHERAL VASCULAR DISEASE   . Personal history of alcoholism   . PROSTATE CANCER, HX OF   . PUD, HX OF   . TRANSIENT ISCHEMIC ATTACK   . Shingles   . Anemia   . AS (aortic stenosis)   . S/P AVR (aortic valve replacement) 09/23/2010  . Complication of anesthesia 09/23/2010    very confused after waking up. Stopped drinking 40 days before this surgery.  . Transfusion reaction, chill fever type 04/28/2011    Fever, rigors, tachycardia,tachypnea but no evidence of hemolysis  . Hemorrhagic cystitis 04/28/2011    Due to previous radiation for PSA recurrence of  prostate cancer   confused this am, fluctuant mental status  Vital signs for last 24 hours: Temp:  [97 F (36.1 C)-100.3 F (37.9 C)] 97.5 F (36.4 C) (04/10 0800) Pulse Rate:  [40-113] 63  (04/10 0800) Resp:  [20-44] 29  (04/10 0800) BP: (90-164)/(52-88) 146/75 mmHg (04/10 0800) SpO2:  [64 %-99 %] 93 % (04/10 0800)  Intake/Output this shift: Total I/O In: 110 [I.V.:10; IV Piggyback:100] Out: 150 [Urine:150]     Physical Exam:  General: int agitation,  in no apparent distress. Eyes: Anicteric sclerae. ENT: Oropharynx clear. Moist mucous membranes. No thrush Lymph: No cervical, supraclavicular, or axillary lymphadenopathy. Heart: Normal S1, S2. No murmurs, rubs, or gallops appreciated. No bruits, equal pulses. Lungs: Normal excursion, no dullness to percussion. Good air movement bilaterally, without wheezes or crackles. Normal upper airway sounds without evidence of stridor. Abdomen: Abdomen soft, non-tender and not distended, normoactive bowel sounds. No hepatosplenomegaly or masses. Musculoskeletal: No clubbing or synovitis. Skin: No rashes or lesions Neuro: Confused. Tremors+ No focal neurologic deficits.  Patient able to protect his airway.   LAB RESULT Results for orders placed during the hospital encounter of 04/25/11 (from the past 24 hour(s))  PREPARE RBC (CROSSMATCH)     Status: Normal   Collection Time   04/27/11  9:59 AM      Component Value Range   Order Confirmation ORDER PROCESSED BY BLOOD BANK    CBC     Status: Abnormal   Collection  Time   04/28/11  3:35 AM      Component Value Range   WBC 11.4 (*) 4.0 - 10.5 (K/uL)   RBC 2.72 (*) 4.22 - 5.81 (MIL/uL)   Hemoglobin 8.2 (*) 13.0 - 17.0 (g/dL)   HCT 16.1 (*) 09.6 - 52.0 (%)   MCV 89.0  78.0 - 100.0 (fL)   MCH 30.1  26.0 - 34.0 (pg)   MCHC 33.9  30.0 - 36.0 (g/dL)   RDW 04.5  40.9 - 81.1 (%)   Platelets 241  150 - 400 (K/uL)  COMPREHENSIVE METABOLIC PANEL     Status: Abnormal   Collection Time   04/28/11   3:35 AM      Component Value Range   Sodium 141  135 - 145 (mEq/L)   Potassium 3.3 (*) 3.5 - 5.1 (mEq/L)   Chloride 110  96 - 112 (mEq/L)   CO2 19  19 - 32 (mEq/L)   Glucose, Bld 152 (*) 70 - 99 (mg/dL)   BUN 36 (*) 6 - 23 (mg/dL)   Creatinine, Ser 9.14 (*) 0.50 - 1.35 (mg/dL)   Calcium 7.9 (*) 8.4 - 10.5 (mg/dL)   Total Protein 6.9  6.0 - 8.3 (g/dL)   Albumin 2.5 (*) 3.5 - 5.2 (g/dL)   AST 27  0 - 37 (U/L)   ALT 13  0 - 53 (U/L)   Alkaline Phosphatase 95  39 - 117 (U/L)   Total Bilirubin 0.5  0.3 - 1.2 (mg/dL)   GFR calc non Af Amer 46 (*) >90 (mL/min)   GFR calc Af Amer 54 (*) >90 (mL/min)  DIFFERENTIAL     Status: Abnormal   Collection Time   04/28/11  3:35 AM      Component Value Range   Neutrophils Relative 89 (*) 43 - 77 (%)   Neutro Abs 10.2 (*) 1.7 - 7.7 (K/uL)   Lymphocytes Relative 6 (*) 12 - 46 (%)   Lymphs Abs 0.7  0.7 - 4.0 (K/uL)   Monocytes Relative 5  3 - 12 (%)   Monocytes Absolute 0.6  0.1 - 1.0 (K/uL)   Eosinophils Relative 0  0 - 5 (%)   Eosinophils Absolute 0.0  0.0 - 0.7 (K/uL)   Basophils Relative 0  0 - 1 (%)   Basophils Absolute 0.0  0.0 - 0.1 (K/uL)  MAGNESIUM     Status: Normal   Collection Time   04/28/11  3:35 AM      Component Value Range   Magnesium 2.3  1.5 - 2.5 (mg/dL)     Assessment:  78 year old male with extensive PMH. 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 Developed fever, chills, shivering and confusional state 4/7 associated to a PRBC transfusion.  His symptoms certainly are compatible with an acute febrile non hemolytic reaction. He had good response to Benadryl and hydrocortisone. Similar event on 4/8 Of note, the patient has history of heavy alcohol consumption and alcohol withdrawal +recent doses of Ativan and morphine complicate matters  Recommendations: Acute febrile, non hemolytic reaction after PRBC transfusion - Seen by heme, tolerated transfusion 4/9 with premeds -No hemolysis noted , coombs neg -  Transfuse for Hb <7   Alcohol abuse / withdrawal - Ativan per CIWA scale , can increase to 1-2 mg prn, replete Mg -Beer has been ordered by urology service who knows him well -high aspiration risk due to fluctuant mental status, all meds changed to IV  AF/RVR - lopressor IV  Gross  hematuria secondary to radiation cystitis - Management per urology  PCCM to sign off, defer management to triad. Goals of care discussion appropriate SD status   Leslieanne Cobarrubias V.  04/28/2011

## 2011-04-28 NOTE — Progress Notes (Signed)
Subjective: Patient remains non communicative, but moves his extremities. Urine light pink. Single clot seen.   Objective: Vital signs in last 24 hours: Temp:  [97 F (36.1 C)-100.3 F (37.9 C)] 97 F (36.1 C) (04/10 0400) Pulse Rate:  [40-150] 63  (04/10 0800) Resp:  [20-44] 29  (04/10 0800) BP: (90-164)/(52-90) 146/75 mmHg (04/10 0800) SpO2:  [64 %-99 %] 93 % (04/10 0800)A  Intake/Output from previous day: 04/09 0701 - 04/10 0700 In: 1223 [P.O.:120; I.V.:493; Blood:350; IV Piggyback:260] Out: 5795 [Urine:5795] Intake/Output this shift: Total I/O In: 110 [I.V.:10; IV Piggyback:100] Out: 150 [Urine:150]  Past Medical History  Diagnosis Date  . ADENOCARCINOMA, PROSTATE   . ALLERGIC RHINITIS   . B12 DEFICIENCY   . BENIGN PROSTATIC HYPERTROPHY   . CAROTID ARTERY DISEASE   . CEREBROVASCULAR ACCIDENT, HX OF   . CORONARY ARTERY DISEASE   . DIVERTICULOSIS, COLON   . GERD   . HEMORRHOIDS   . HIATAL HERNIA   . HYPERLIPIDEMIA   . HYPERTENSION   . OSTEOPENIA   . PEPTIC ULCER DISEASE   . PERIPHERAL VASCULAR DISEASE   . Personal history of alcoholism   . PROSTATE CANCER, HX OF   . PUD, HX OF   . TRANSIENT ISCHEMIC ATTACK   . Shingles   . Anemia   . AS (aortic stenosis)   . S/P AVR (aortic valve replacement) 09/23/2010  . Complication of anesthesia 09/23/2010    very confused after waking up. Stopped drinking 40 days before this surgery.  . Transfusion reaction, chill fever type 04/28/2011    Fever, rigors, tachycardia,tachypnea but no evidence of hemolysis  . Hemorrhagic cystitis 04/28/2011    Due to previous radiation for PSA recurrence of prostate cancer    Physical Exam:  General: thin w male in nac. Sonorous. Lungs - Normal respiratory effort, chest expands symmetrically.  Abdomen - Soft, non-tender & non-distended.  Lab Results:  Basename 04/28/11 0335 04/27/11 0330 04/26/11 0420  WBC 11.4* 19.0* 16.7*  HGB 8.2* 6.9* 7.2*  HCT 24.2* 20.5* 21.5*    BMET  Basename 04/28/11 0335 04/27/11 0330  NA 141 138  K 3.3* 3.6  CL 110 109  CO2 19 18*  GLUCOSE 152* 121*  BUN 36* 29*  CREATININE 1.36* 1.24  CALCIUM 7.9* 7.7*   No results found for this basename: LABURIN:1 in the last 72 hours Results for orders placed during the hospital encounter of 04/25/11  URINE CULTURE     Status: Normal   Collection Time   04/25/11  4:24 AM      Component Value Range Status Comment   Specimen Description URINE, CATHETERIZED   Final    Special Requests NONE   Final    Culture  Setup Time [id number]   Final    Colony Count >=100,000 COLONIES/ML   Final    Culture ESCHERICHIA COLI   Final    Report Status 04/27/2011 FINAL   Final    Organism ID, Bacteria ESCHERICHIA COLI   Final   MRSA PCR SCREENING     Status: Normal   Collection Time   04/25/11  6:54 PM      Component Value Range Status Comment   MRSA by PCR NEGATIVE  NEGATIVE  Final     Studies/Results: @RISRSLT24 @  Assessment/Plan: Urine is light pink with a single clot. Will continue carboprost protocol for 24 hrs morwe, then top and irrigate bladder prn. Appears now to be improving with xfusion. Will continue to follow.   Patsi Sears,  Addley Ballinger I 04/28/2011, 8:40 AM

## 2011-04-28 NOTE — Progress Notes (Signed)
eLink Physician-Brief Progress Note Patient Name: KAYRON HICKLIN DOB: 1926/08/08 MRN: [id number]  Date of Service  04/28/2011   HPI/Events of Note   Hypokalemia  eICU Interventions  Potassium replaced   Intervention Category Minor Interventions: Electrolytes abnormality - evaluation and management  Caroljean Monsivais 04/28/2011, 4:58 AM

## 2011-04-29 ENCOUNTER — Inpatient Hospital Stay (HOSPITAL_COMMUNITY): Payer: Medicare Other

## 2011-04-29 DIAGNOSIS — E43 Unspecified severe protein-calorie malnutrition: Secondary | ICD-10-CM | POA: Diagnosis present

## 2011-04-29 DIAGNOSIS — J189 Pneumonia, unspecified organism: Secondary | ICD-10-CM | POA: Diagnosis not present

## 2011-04-29 DIAGNOSIS — Y95 Nosocomial condition: Secondary | ICD-10-CM | POA: Diagnosis not present

## 2011-04-29 DIAGNOSIS — N179 Acute kidney failure, unspecified: Secondary | ICD-10-CM | POA: Diagnosis not present

## 2011-04-29 DIAGNOSIS — J9601 Acute respiratory failure with hypoxia: Secondary | ICD-10-CM | POA: Diagnosis not present

## 2011-04-29 LAB — CBC
HCT: 22.2 % — ABNORMAL LOW (ref 39.0–52.0)
Hemoglobin: 7.5 g/dL — ABNORMAL LOW (ref 13.0–17.0)
MCHC: 33.8 g/dL (ref 30.0–36.0)
MCV: 88.4 fL (ref 78.0–100.0)
RDW: 14.5 % (ref 11.5–15.5)
WBC: 8.5 10*3/uL (ref 4.0–10.5)

## 2011-04-29 LAB — BASIC METABOLIC PANEL
BUN: 41 mg/dL — ABNORMAL HIGH (ref 6–23)
BUN: 42 mg/dL — ABNORMAL HIGH (ref 6–23)
CO2: 20 mEq/L (ref 19–32)
CO2: 21 mEq/L (ref 19–32)
Chloride: 112 mEq/L (ref 96–112)
Chloride: 117 mEq/L — ABNORMAL HIGH (ref 96–112)
Creatinine, Ser: 1.46 mg/dL — ABNORMAL HIGH (ref 0.50–1.35)
GFR calc non Af Amer: 40 mL/min — ABNORMAL LOW (ref >90)
Glucose, Bld: 115 mg/dL — ABNORMAL HIGH (ref 70–99)
Glucose, Bld: 129 mg/dL — ABNORMAL HIGH (ref 70–99)
Potassium: 3.5 mEq/L (ref 3.5–5.1)
Potassium: 3.3 mEq/L — ABNORMAL LOW (ref 3.5–5.1)

## 2011-04-29 LAB — CARDIAC PANEL(CRET KIN+CKTOT+MB+TROPI)
CK, MB: 2.3 ng/mL (ref 0.3–4.0)
Relative Index: 1.2 (ref 0.0–2.5)
Total CK: 186 U/L (ref 7–232)

## 2011-04-29 LAB — BLOOD GAS, ARTERIAL
Acid-base deficit: 2.7 mmol/L — ABNORMAL HIGH (ref 0.0–2.0)
Bicarbonate: 20.2 mEq/L (ref 20.0–24.0)
FIO2: 100 %
O2 Saturation: 100 %
TCO2: 19.4 mmol/L (ref 0–100)
pO2, Arterial: 370 mmHg — ABNORMAL HIGH (ref 80.0–100.0)

## 2011-04-29 LAB — DIFFERENTIAL
Basophils Relative: 0 % (ref 0–1)
Lymphs Abs: 0.9 10*3/uL (ref 0.7–4.0)
Monocytes Absolute: 1 10*3/uL (ref 0.1–1.0)
Monocytes Relative: 11 % (ref 3–12)
Neutro Abs: 6.6 10*3/uL (ref 1.7–7.7)

## 2011-04-29 LAB — PROCALCITONIN: Procalcitonin: 8.34 ng/mL

## 2011-04-29 MED ORDER — SODIUM CHLORIDE 0.9 % IV SOLN
2.0000 mg/h | INTRAVENOUS | Status: DC
  Administered 2011-04-29: 1 mg/h via INTRAVENOUS
  Administered 2011-04-30: 2 mg/h via INTRAVENOUS
  Administered 2011-05-02 (×2): 1 mg/h via INTRAVENOUS
  Filled 2011-04-29 (×4): qty 10

## 2011-04-29 MED ORDER — PANTOPRAZOLE SODIUM 40 MG IV SOLR
40.0000 mg | Freq: Every day | INTRAVENOUS | Status: DC
  Administered 2011-04-29: 40 mg via INTRAVENOUS
  Filled 2011-04-29 (×2): qty 40

## 2011-04-29 MED ORDER — FENTANYL CITRATE 0.05 MG/ML IJ SOLN
100.0000 ug | Freq: Once | INTRAMUSCULAR | Status: AC
  Administered 2011-04-29: 100 ug via INTRAVENOUS

## 2011-04-29 MED ORDER — FENTANYL CITRATE 0.05 MG/ML IJ SOLN
INTRAMUSCULAR | Status: AC
  Filled 2011-04-29: qty 2

## 2011-04-29 MED ORDER — IPRATROPIUM BROMIDE 0.02 % IN SOLN
0.5000 mg | RESPIRATORY_TRACT | Status: DC
  Administered 2011-04-29 – 2011-05-02 (×16): 0.5 mg via RESPIRATORY_TRACT
  Filled 2011-04-29 (×16): qty 2.5

## 2011-04-29 MED ORDER — POTASSIUM CHLORIDE 10 MEQ/100ML IV SOLN
10.0000 meq | INTRAVENOUS | Status: AC
  Administered 2011-04-29 (×4): 10 meq via INTRAVENOUS
  Filled 2011-04-29: qty 100
  Filled 2011-04-29: qty 300

## 2011-04-29 MED ORDER — FOLIC ACID 5 MG/ML IJ SOLN
1.0000 mg | Freq: Every day | INTRAMUSCULAR | Status: DC
  Administered 2011-04-29 – 2011-05-09 (×11): 1 mg via INTRAVENOUS
  Filled 2011-04-29 (×16): qty 0.2

## 2011-04-29 MED ORDER — MIDAZOLAM HCL 5 MG/ML IJ SOLN
INTRAMUSCULAR | Status: AC
  Filled 2011-04-29: qty 1

## 2011-04-29 MED ORDER — ALBUTEROL SULFATE (5 MG/ML) 0.5% IN NEBU
2.5000 mg | INHALATION_SOLUTION | Freq: Four times a day (QID) | RESPIRATORY_TRACT | Status: DC
  Administered 2011-04-29 – 2011-05-02 (×12): 2.5 mg via RESPIRATORY_TRACT
  Filled 2011-04-29 (×12): qty 0.5

## 2011-04-29 MED ORDER — SODIUM CHLORIDE 0.9 % IV SOLN: 1.0000 mg | Freq: Every day | INTRAVENOUS | Status: DC

## 2011-04-29 MED ORDER — SODIUM CHLORIDE 0.9 % IR SOLN: Status: DC

## 2011-04-29 MED ORDER — MIDAZOLAM BOLUS VIA INFUSION
1.0000 mg | INTRAVENOUS | Status: DC | PRN
  Administered 2011-05-02: 1 mg via INTRAVENOUS
  Filled 2011-04-29: qty 2

## 2011-04-29 MED ORDER — AMIODARONE HCL IN DEXTROSE 360-4.14 MG/200ML-% IV SOLN
60.0000 mg/h | INTRAVENOUS | Status: AC
  Administered 2011-04-29: 60 mg/h via INTRAVENOUS
  Filled 2011-04-29 (×2): qty 200

## 2011-04-29 MED ORDER — SODIUM CHLORIDE 0.9 % IV SOLN
50.0000 ug/h | INTRAVENOUS | Status: DC
  Administered 2011-04-29 – 2011-04-30 (×3): 50 ug/h via INTRAVENOUS
  Administered 2011-04-30: 100 ug/h via INTRAVENOUS
  Filled 2011-04-29 (×3): qty 50

## 2011-04-29 MED ORDER — AMIODARONE HCL IN DEXTROSE 360-4.14 MG/200ML-% IV SOLN
30.0000 mg/h | INTRAVENOUS | Status: DC
  Administered 2011-04-29 – 2011-04-30 (×3): 30 mg/h via INTRAVENOUS
  Filled 2011-04-29 (×7): qty 200

## 2011-04-29 MED ORDER — AMIODARONE LOAD VIA INFUSION
150.0000 mg | Freq: Once | INTRAVENOUS | Status: DC
  Administered 2011-04-29: 150 mg via INTRAVENOUS
  Filled 2011-04-29: qty 83.34

## 2011-04-29 MED ORDER — VANCOMYCIN HCL 1000 MG IV SOLR
1250.0000 mg | INTRAVENOUS | Status: DC
  Administered 2011-04-29 – 2011-05-01 (×3): 1250 mg via INTRAVENOUS
  Filled 2011-04-29 (×4): qty 1250

## 2011-04-29 MED ORDER — FENTANYL BOLUS VIA INFUSION
50.0000 ug | Freq: Four times a day (QID) | INTRAVENOUS | Status: DC | PRN
  Filled 2011-04-29: qty 100

## 2011-04-29 MED ORDER — ETOMIDATE 2 MG/ML IV SOLN
0.3000 mg/kg | Freq: Once | INTRAVENOUS | Status: AC
  Administered 2011-04-29: 20 mg via INTRAVENOUS
  Filled 2011-04-29: qty 12.72

## 2011-04-29 MED ORDER — MIDAZOLAM HCL 5 MG/ML IJ SOLN
4.0000 mg | Freq: Once | INTRAMUSCULAR | Status: AC
  Administered 2011-04-29: 4 mg via INTRAVENOUS

## 2011-04-29 MED ORDER — PIPERACILLIN-TAZOBACTAM 3.375 G IVPB
3.3750 g | Freq: Three times a day (TID) | INTRAVENOUS | Status: DC
  Administered 2011-04-29 – 2011-05-10 (×33): 3.375 g via INTRAVENOUS
  Filled 2011-04-29 (×37): qty 50

## 2011-04-29 MED ORDER — MIDAZOLAM BOLUS VIA INFUSION
4.0000 mg | Freq: Once | INTRAVENOUS | Status: DC
  Filled 2011-04-29: qty 4

## 2011-04-29 NOTE — Progress Notes (Signed)
@   Subjective:  Moves ext; does not respond to questions.   Objective:  Filed Vitals:   04/29/11 0200 04/29/11 0400 04/29/11 0500 04/29/11 0600  BP: 107/53 125/62  81/53  Pulse: 70 58 88 79  Temp:   98.2 F (36.8 C)   TempSrc:   Oral   Resp: 24 26 24 19   Height:      Weight:      SpO2: 99% 96% 96% 100%    Intake/Output from previous day:  Intake/Output Summary (Last 24 hours) at 04/29/11 0800 Last data filed at 04/29/11 0700  Gross per 24 hour  Intake 3458.83 ml  Output   8875 ml  Net -5416.17 ml    Physical Exam: Physical exam: Well-developed, agitated, frail Skin is warm and dry.  HEENT is normal.  Neck is supple.  Chest is clear to auscultation anteriorly Cardiovascular exam is tachycardic and irregular Abdominal exam nontender or distended. No masses palpated. Extremities show no edema. neuro not assessed; patient agitated; no response to questions; moves all ext    Lab Results: Basic Metabolic Panel:  Basename 04/29/11 0130 04/28/11 0335  NA 144 141  K 3.5 3.3*  CL 112 110  CO2 20 19  GLUCOSE 115* 152*  BUN 41* 36*  CREATININE 1.53* 1.36*  CALCIUM 7.8* 7.9*  MG 2.1 2.3  PHOS -- --   CBC:  Basename 04/29/11 0130 04/28/11 0335  WBC 8.5 11.4*  NEUTROABS 6.6 10.2*  HGB 7.5* 8.2*  HCT 22.2* 24.2*  MCV 88.4 89.0  PLT 279 241   Cardiac Enzymes:  Basename 04/29/11 0130 04/28/11 1625 04/28/11 1002  CKTOTAL 186 271* 262*  CKMB 2.3 4.2* 4.4*  CKMBINDEX -- -- --  TROPONINI 0.38* <0.30 0.40*     Assessment/Plan:  1) atrial fibrillation - patient in and out of atrial fibrillation; afib most likely being driven by ETOH withdrawal, UTI and anemia; LV function normal; continue cardizem; add IV amiodarone. Patient cannot be anticoagulated given hematuria and anemia (also ETOH withdrawal). 2) Mild elevation in troponin - most likely related to atrial fibrillation; LV function normal; no plans for further ischemia eval 3) UTI - continue antibiotics 4)  hematuria/acute blood loss anemia - transfuse as needed; management per urology 5) acute on chronic renal insuff - hold lasix 6) CAD (s/p CABG) 7) ETOH withdrawal - management per primary team   Olga Millers 04/29/2011, 8:00 AM

## 2011-04-29 NOTE — Progress Notes (Signed)
Patient name: [name] Medical record number: [id number] Date of birth: 17-Oct-1926 Age: 78 y.o. Gender: male PCP: Oliver Barre, MD, MD  PCCM FU NOTE  Date: 04/29/2011 Reason for Consult: Acute transfusion reaction Referring Physician: Altha Harm, MD  History of Present Illness: 78 year old male with extensive PMH relevant for prostate cancer, active ETOH use, CVA, CAD, HTN, PVD, AS post valve replacement. 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 Critical care consult called by Dr. Ashley Royalty on 4/7 for evaluation of high grade fevers, chills , tachypnea during transfusion , recurred 4/8 >> attributed to minor non hemolytic reaction, tolerated transfusion 4/9 with pre-medication. Course complicated by ETOH withdrawal, e.coli UTI  & RVR 4/10 PCCM signed off. 4/11 PCCM reconsulted for agitation.Marland Kitchen  HCAP RLL on CXR, resp failure acute, intubated and tfr to PCCM .  Lines/tubes ETT 4/11>> CVL 4/11>>  Microbiology/Sepsis markers: 4/7 urine >> e. Coli S to ceftx 4/11 resp c/s >>>  Abx ceftx 4/7 >>off 4/11 zoysn(HCAP)>> 4/11 Vanco (HCAP)>>  Events: CT HEAD 4/11>>no acute process    Subj: Called back for resp distress, AMS.  Pt with neg CT HEAD,  Pt very dry. On dilt and amiodarone drip.   RR 40s.  ?acidosis.   Vital signs for last 24 hours: Temp:  [97.5 F (36.4 C)-99.5 F (37.5 C)] 97.7 F (36.5 C) (04/11 0800) Pulse Rate:  [54-184] 79  (04/11 0600) Resp:  [19-37] 19  (04/11 0600) BP: (81-176)/(53-109) 81/53 mmHg (04/11 0600) SpO2:  [94 %-100 %] 100 % (04/11 0600)  Intake/Output this shift:       Physical Exam:  General: int agitation,  In resp distress Eyes: Anicteric sclerae. ENT: Oropharynx clear. Moist mucous membranes. No thrush Lymph: No cervical, supraclavicular, or axillary lymphadenopathy. Heart: .HSIR Lungs: coarse rhonchi, Tachypneic Abdomen: Abdomen soft, non-tender and not distended, normoactive bowel sounds. No  hepatosplenomegaly or masses. Musculoskeletal: No clubbing or synovitis. Skin: No rashes or lesions Neuro: Confused. Tremors+ No focal neurologic deficits Not able to protect airway  LAB RESULT Results for orders placed during the hospital encounter of 04/25/11 (from the past 24 hour(s))  CARDIAC PANEL(CRET KIN+CKTOT+MB+TROPI)     Status: Abnormal   Collection Time   04/28/11  4:25 PM      Component Value Range   Total CK 271 (*) 7 - 232 (U/L)   CK, MB 4.2 (*) 0.3 - 4.0 (ng/mL)   Troponin I <0.30  <0.30 (ng/mL)   Relative Index 1.5  0.0 - 2.5   CARDIAC PANEL(CRET KIN+CKTOT+MB+TROPI)     Status: Abnormal   Collection Time   04/29/11  1:30 AM      Component Value Range   Total CK 186  7 - 232 (U/L)   CK, MB 2.3  0.3 - 4.0 (ng/mL)   Troponin I 0.38 (*) <0.30 (ng/mL)   Relative Index 1.2  0.0 - 2.5   CBC     Status: Abnormal   Collection Time   04/29/11  1:30 AM      Component Value Range   WBC 8.5  4.0 - 10.5 (K/uL)   RBC 2.51 (*) 4.22 - 5.81 (MIL/uL)   Hemoglobin 7.5 (*) 13.0 - 17.0 (g/dL)   HCT 14.7 (*) 82.9 - 52.0 (%)   MCV 88.4  78.0 - 100.0 (fL)   MCH 29.9  26.0 - 34.0 (pg)   MCHC 33.8  30.0 - 36.0 (g/dL)   RDW 56.2  13.0 - 86.5 (%)  Platelets 279  150 - 400 (K/uL)  MAGNESIUM     Status: Normal   Collection Time   04/29/11  1:30 AM      Component Value Range   Magnesium 2.1  1.5 - 2.5 (mg/dL)  DIFFERENTIAL     Status: Abnormal   Collection Time   04/29/11  1:30 AM      Component Value Range   Neutrophils Relative 78 (*) 43 - 77 (%)   Neutro Abs 6.6  1.7 - 7.7 (K/uL)   Lymphocytes Relative 11 (*) 12 - 46 (%)   Lymphs Abs 0.9  0.7 - 4.0 (K/uL)   Monocytes Relative 11  3 - 12 (%)   Monocytes Absolute 1.0  0.1 - 1.0 (K/uL)   Eosinophils Relative 0  0 - 5 (%)   Eosinophils Absolute 0.0  0.0 - 0.7 (K/uL)   Basophils Relative 0  0 - 1 (%)   Basophils Absolute 0.0  0.0 - 0.1 (K/uL)  PRO B NATRIURETIC PEPTIDE     Status: Abnormal   Collection Time   04/29/11  1:30 AM       Component Value Range   Pro B Natriuretic peptide (BNP) 11811.0 (*) 0 - 450 (pg/mL)  BASIC METABOLIC PANEL     Status: Abnormal   Collection Time   04/29/11  1:30 AM      Component Value Range   Sodium 144  135 - 145 (mEq/L)   Potassium 3.5  3.5 - 5.1 (mEq/L)   Chloride 112  96 - 112 (mEq/L)   CO2 20  19 - 32 (mEq/L)   Glucose, Bld 115 (*) 70 - 99 (mg/dL)   BUN 41 (*) 6 - 23 (mg/dL)   Creatinine, Ser 1.61 (*) 0.50 - 1.35 (mg/dL)   Calcium 7.8 (*) 8.4 - 10.5 (mg/dL)   GFR calc non Af Amer 40 (*) >90 (mL/min)   GFR calc Af Amer 47 (*) >90 (mL/min)   Dg Abd Acute W/chest  04/29/2011  *RADIOLOGY REPORT*  Clinical Data: Edema, ileus, delirium tremens.  ACUTE ABDOMEN SERIES (ABDOMEN 2 VIEW & CHEST 1 VIEW)  Comparison: 04/28/2011.  Findings: Frontal view is slightly rotated.  Image quality is degraded by motion.  Trachea is midline.  Heart size stable.  Mild diffuse bilateral air space disease, with focality in the right lung base.  Two views of the abdomen show minimal gaseous prominence of small bowel and colon, as before.  IMPRESSION:  1.  Pulmonary edema with right basilar airspace consolidation. Superimposed pneumonia cannot be excluded. 2.  Stable mild gaseous distention of small bowel and colon, favoring an ileus.  Original Report Authenticated By: Reyes Ivan, M.D.   Dg Abd Acute W/chest  04/28/2011  *RADIOLOGY REPORT*  Clinical Data: Abdominal pain.  Follow up of pulmonary edema.  ACUTE ABDOMEN SERIES (ABDOMEN 2 VIEW & CHEST 1 VIEW)  Comparison: Chest film 04/26/2011.  Plain film abdomen 04/26/2011.  Findings: Frontal view of the chest demonstrates midline trachea. Normal heart size.  Right costophrenic angle minimally excluded. No pleural effusion or pneumothorax.  Increasing moderate interstitial edema.  Patchy right greater than left bibasilar airspace disease is progressive. Prior median sternotomy.  Abominal films demonstrate no free intraperitoneal air on decubitus positioning.   Minimal air fluid levels within the small bowel loops.  Motion degraded abdominal films.  Supine view demonstrates gas filled small bowel loops which measure up to 3.1 cm.  Pelvis excluded from the supine film.  IMPRESSION:  1.  Progressive  congestive heart failure. 2.  Developing bibasilar airspace disease, which could represent atelectasis or concurrent infection. 3.  Nonspecific bowel gas pattern.  New borderline small bowel dilatation with minimal air fluid levels.  Question adynamic ileus versus less likely  low grade partial small bowel obstruction.  Original Report Authenticated By: Consuello Bossier, M.D.     Assessment:  4/11 reconsulted. Note neurogenic breathing and no focus or follow.  Acute respiratory failure d/t RLL HCAP and severe debilitation Plan Full vent supp for now continous sedation   Hosp acquired RLL PNA  With aspiration. Assessment: New RLL infiltrate, leukocytosis,   Plan VAnco/zosyn F/u resp c/s pulm toilet, BD neb meds Place CVL  . Alcohol abuse / withdrawal D/c ativan,  Start continuous sedation protocol   AF/RVR - per cards HR lower after intubation, may need to hold diltiazem drip., Will cont amiod drip  Gross hematuria secondary to radiation cystitis with prostate CA hx  - Management per urology   Severe protein calorie malnutrition Plan TF when stable NPO  Will update spouse when she returns Tfr to PCCM primary svc Full code TF later DVT proph PPI for SUP     [facility] Minor ACNP Adolph Pollack PCCM Pager [phone] till 3 pm If no answer page [phone] 04/29/2011, 10:29 AM  I have seen and examined this patient with the nurse practionner and agree with the above assessment and plan.   Shan Levans Beeper  [phone]  Cell  [phone]  If no response or cell goes to voicemail, call beeper [phone]

## 2011-04-29 NOTE — Progress Notes (Addendum)
ANTIBIOTIC CONSULT NOTE - INITIAL  Pharmacy Consult for Zosyn Indication: pneumonia  Allergies  Allergen Reactions  . Tolterodine Tartrate     Patient Measurements: Height: 5\' 11"  (180.3 cm) Weight: 186 lb 15.2 oz (84.8 kg) IBW/kg (Calculated) : 75.3    Vital Signs: Temp: 97.7 F (36.5 C) (04/11 0800) Temp src: Axillary (04/11 0800) BP: 81/53 mmHg (04/11 0600) Pulse Rate: 79  (04/11 0600) Intake/Output from previous day: 04/10 0701 - 04/11 0700 In: 3568.8 [I.V.:618.8; IV Piggyback:250] Out: 9025 [Urine:9025] Intake/Output from this shift:    Labs:  Basename 04/29/11 0130 04/28/11 0335 04/27/11 0330  WBC 8.5 11.4* 19.0*  HGB 7.5* 8.2* 6.9*  PLT 279 241 209  LABCREA -- -- --  CREATININE 1.53* 1.36* 1.24   Estimated Creatinine Clearance: 39 ml/min (by C-G formula based on Cr of 1.53).   Microbiology: 4/7 mrsa pcr: negative 4/7 urine: >100k E.coli (sensitive to all agents tested except ampicillin)  Medical History: Past Medical History  Diagnosis Date  . ADENOCARCINOMA, PROSTATE   . ALLERGIC RHINITIS   . B12 DEFICIENCY   . BENIGN PROSTATIC HYPERTROPHY   . CAROTID ARTERY DISEASE   . CEREBROVASCULAR ACCIDENT, HX OF   . CORONARY ARTERY DISEASE   . DIVERTICULOSIS, COLON   . GERD   . HEMORRHOIDS   . HIATAL HERNIA   . HYPERLIPIDEMIA   . HYPERTENSION   . OSTEOPENIA   . PEPTIC ULCER DISEASE   . PERIPHERAL VASCULAR DISEASE   . Personal history of alcoholism   . PROSTATE CANCER, HX OF   . PUD, HX OF   . TRANSIENT ISCHEMIC ATTACK   . Shingles   . Anemia   . AS (aortic stenosis)   . S/P AVR (aortic valve replacement) 09/23/2010  . Complication of anesthesia 09/23/2010    very confused after waking up. Stopped drinking 40 days before this surgery.  . Transfusion reaction, chill fever type 04/28/2011    Fever, rigors, tachycardia,tachypnea but no evidence of hemolysis  . Hemorrhagic cystitis 04/28/2011    Due to previous radiation for PSA recurrence of prostate  cancer    Medications:  Scheduled:    . acetaminophen  650 mg Oral Once  . amiodarone  150 mg Intravenous Once  . antiseptic oral rinse  15 mL Mouth Rinse BID  . clotrimazole   Topical BID  . diphenhydrAMINE  25 mg Intravenous Once  . folic acid  1 mg Oral Daily  . furosemide  40 mg Intravenous Q12H  . haloperidol lactate  2 mg Intravenous Once  . meperidine  25 mg Intravenous Once  . metoprolol  5 mg Intravenous Once  . mulitivitamin with minerals  1 tablet Oral Daily  . potassium chloride  10 mEq Intravenous Q1 Hr x 4  . potassium chloride  10 mEq Intravenous Q1 Hr x 4  . sodium chloride  3 mL Intravenous Q12H  . thiamine  100 mg Intravenous Daily  . DISCONTD: cefTRIAXone (ROCEPHIN)  IV  1 g Intravenous Q24H  . DISCONTD: furosemide  40 mg Intravenous Q12H   Infusions:    . sodium chloride 20 mL/hr (04/26/11 0015)  . amiodarone (NEXTERONE PREMIX) 360 mg/200 mL dextrose     And  . amiodarone (NEXTERONE PREMIX) 360 mg/200 mL dextrose    . carboprost (HEMABATE) bladder irrigation - continuous 100 mL/hr at 04/28/11 0956  . diltiazem (CARDIZEM) infusion 15 mg/hr (04/29/11 0503)   Assessment:  38 YOM with prostate cancer presents with gross hematuria due to radiation  cystitis.  Currently completing day #3/3 of carboprost bladder irrigation  Also experiencing alcohol withdrawal symptoms.  Transferred to ICU after transfusion reaction to PRBC.  Has received 2 doses Rocephin for E.coli UTI, now changing to Vanc/Zosyn to also cover for PNA.  Scr is elevated, but standard dose of zosyn is appropriate for CrCl of 39 ml/min.  Goal of Therapy:  Eradication of infection  Plan:   Vancomcyin 1250mg  IV q24h  Check trough at steady state Zosyn 3.375gm IV q8h (4hr extended infusions) Follow renal function & cultures  Loralee Pacas, PharmD, BCPS Pager: [phone] 04/29/2011,9:27 AM

## 2011-04-29 NOTE — Progress Notes (Signed)
Subjective: Pt is a habitual drinker who underestimates his drinking and went into ETOH withdrawal last night. Dr Ashley Royalty spoke with Dr. Patsi Sears who knows this patient well and he reports that he usually fails the benzodiazapine protocols and typically requires Beer while hospitalized. Pt was  Awake earlier and reportedly yesterday and swinging at the nurses.  During this hospitalization patient developed to transfusion reactions. A hematology consultation was obtained and patient was noted not to have any autoantibodies or allo antibodies. Patient was premedicated and transfused one unit of packed red blood cells without any further reaction. Patient's hemoglobin went up to 8.2 and is currently at 7.5. Patient during this time was being treated for urinary tract infection as well. Patient developed atrial fibrillation with RVR with heart rates as high as the 180s. Cardiac enzymes were cycled the first set positive and. BNP which was obtained was also elevated. Patient is also currently in acute on chronic diastolic heart failure. Cardiology is being consulted patient currently on a Cardizem drip and amiodarone drip. Patient's alcohol withdrawal has been difficult to control he is on the Ativan withdrawal protocol however is still agitated and combative. Patient has also developed an ileus and and pneumonia. Acute abdominal series.  Today Patient still agitated and combative. Patient is not communicating. However is moving all his extremities spontaneously. Objective: Filed Vitals:   04/29/11 0400 04/29/11 0500 04/29/11 0600 04/29/11 0800  BP: 125/62  81/53   Pulse: 58 88 79   Temp:  98.2 F (36.8 C)  97.7 F (36.5 C)  TempSrc:  Oral  Axillary  Resp: 26 24 19    Height:      Weight:      SpO2: 96% 96% 100%    Weight change:   Intake/Output Summary (Last 24 hours) at 04/29/11 1004 Last data filed at 04/29/11 0700  Gross per 24 hour  Intake 3298.83 ml  Output   8875 ml  Net -5576.17 ml      General: Somnolent in no acute distress.  HEENT: [place]/AT PEERL, EOMI Neck: Trachea midline,  no masses, no thyromegaly, positive JVD, no carotid bruit Heart: Tachycardia He is without murmurs, rubs, gallops, PMI non-displaced, no heaves or thrills on palpation.  Lungs: Right basilar crackles, Decreased BS in bases.  Abdomen: diffuse tenderness to palpation per facial expression. No BS, no masses no hepatosplenomegaly noted..  Musculoskeletal: No warm swelling or erythema around joints, no spinal tenderness noted. Ext: NO c/c/e    Lab Results:  Basename 04/29/11 0130 04/28/11 0335  NA 144 141  K 3.5 3.3*  CL 112 110  CO2 20 19  GLUCOSE 115* 152*  BUN 41* 36*  CREATININE 1.53* 1.36*  CALCIUM 7.8* 7.9*  MG 2.1 2.3  PHOS -- --    Basename 04/28/11 0335 04/27/11 0330  AST 27 24  ALT 13 10  ALKPHOS 95 89  BILITOT 0.5 0.5  PROT 6.9 6.4  ALBUMIN 2.5* 2.5*   No results found for this basename: LIPASE:2,AMYLASE:2 in the last 72 hours  Basename 04/29/11 0130 04/28/11 0335  WBC 8.5 11.4*  NEUTROABS 6.6 10.2*  HGB 7.5* 8.2*  HCT 22.2* 24.2*  MCV 88.4 89.0  PLT 279 241    Basename 04/29/11 0130 04/28/11 1625 04/28/11 1002  CKTOTAL 186 271* 262*  CKMB 2.3 4.2* 4.4*  CKMBINDEX -- -- --  TROPONINI 0.38* <0.30 0.40*   No components found with this basename: POCBNP:3 No results found for this basename: DDIMER:2 in the last 72 hours No results  found for this basename: HGBA1C:2 in the last 72 hours No results found for this basename: CHOL:2,HDL:2,LDLCALC:2,TRIG:2,CHOLHDL:2,LDLDIRECT:2 in the last 72 hours No results found for this basename: TSH,T4TOTAL,FREET3,T3FREE,THYROIDAB in the last 72 hours  Basename 04/27/11 0330  VITAMINB12 --  FOLATE --  FERRITIN --  TIBC --  IRON --  RETICCTPCT 4.2*    Micro Results: Recent Results (from the past 240 hour(s))  URINE CULTURE     Status: Normal   Collection Time   04/25/11  4:24 AM      Component Value Range Status Comment    Specimen Description URINE, CATHETERIZED   Final    Special Requests NONE   Final    Culture  Setup Time [id number]   Final    Colony Count >=100,000 COLONIES/ML   Final    Culture ESCHERICHIA COLI   Final    Report Status 04/27/2011 FINAL   Final    Organism ID, Bacteria ESCHERICHIA COLI   Final   MRSA PCR SCREENING     Status: Normal   Collection Time   04/25/11  6:54 PM      Component Value Range Status Comment   MRSA by PCR NEGATIVE  NEGATIVE  Final     Studies/Results: No results found.  Medications: I have reviewed the patient's current medications. Scheduled Meds:    . acetaminophen  650 mg Oral Once  . amiodarone  150 mg Intravenous Once  . antiseptic oral rinse  15 mL Mouth Rinse BID  . clotrimazole   Topical BID  . diphenhydrAMINE  25 mg Intravenous Once  . folic acid  1 mg Oral Daily  . furosemide  40 mg Intravenous Q12H  . haloperidol lactate  2 mg Intravenous Once  . meperidine  25 mg Intravenous Once  . metoprolol  5 mg Intravenous Once  . mulitivitamin with minerals  1 tablet Oral Daily  . piperacillin-tazobactam (ZOSYN)  IV  3.375 g Intravenous Q8H  . potassium chloride  10 mEq Intravenous Q1 Hr x 4  . sodium chloride  3 mL Intravenous Q12H  . thiamine  100 mg Intravenous Daily  . DISCONTD: cefTRIAXone (ROCEPHIN)  IV  1 g Intravenous Q24H  . DISCONTD: furosemide  40 mg Intravenous Q12H   Continuous Infusions:    . sodium chloride 20 mL/hr (04/26/11 0015)  . amiodarone (NEXTERONE PREMIX) 360 mg/200 mL dextrose 60 mg/hr (04/29/11 0900)   And  . amiodarone (NEXTERONE PREMIX) 360 mg/200 mL dextrose    . carboprost (HEMABATE) bladder irrigation - continuous 100 mL/hr at 04/28/11 0956  . diltiazem (CARDIZEM) infusion 15 mg/hr (04/29/11 0503)   PRN Meds:.acetaminophen, acetaminophen, LORazepam, LORazepam, LORazepam, metoprolol, morphine, oxyCODONE Assessment/Plan: Patient Active Hospital Problem List:  #1 Hematuria (02/25/2011)   Assessment: Urine  clearing up and slowly improving. Pt presently on CBI and Dr. Patsi Sears instituting Carboprost protocol.   Plan: Will defer to urology.  #2 Atrial fibrillation with RVR Felt to be primarily secondary to alcohol withdrawal in the setting of UTI, anemia, and now and pneumonia. Patient's heart rate has ranged from 107 through the 180s. Telemetry. Patient is currently on a diltiazem drip. Amiodarone drip has been started per cardiology. Patient is not a Coumadin candidate secondary to hematuria. Cardiology is following and appreciate input and recommendations.  #3 acute on chronic diastolic CHF exacerbation Likely secondary to A. fib with RVR. Cardiac enzymes with a slightly elevated troponin felt to be secondary to A. fib. 2-D echo with a normal EF and mild aortic stenosis.  IV Lasix has been discontinued secondary to worsening renal function. Cardiology is following and I appreciate your input and recommendations  #4 healthcare associated pneumonia versus aspiration pneumonia Per chest x-ray. Patient is currently afebrile. Patient's leukocytosis is trending down after being started on Rocephin for UTI. Will change IV Rocephin to IV Zosyn.    #5 HYPERTENSION (11/09/2006)   Assessment: BP adequate at present.    #6 Personal history of alcoholism (07/31/2008)   Assessment: presently in withdrawal. CIWA protocol in place. Patient is currently still agitated and combative. Will consult with PCP CM to see whether patient might be a candidate for Precedex.      #7 Confusion (01/29/2011)   Assessment: At this point likely secondary to ETOH withdrawal.patient on Ativan see what protocol however still very agitated and combative with no significant improvement. Patient is moving extremities spontaneously. Will have PT see him evaluate to see whether patient may be a candidate for Precedex. Will check a CT of the head without contrast.     #8 Transfusion reaction chill fever type (04/26/2011)   Assessment: Pt  with 2 transfusion reaction. Patient seen by hematology and no autoantibodies or alloantibodies detected. Patient premedicated and transfused yesterday without any reaction. Hgb now 7.5 from 8.2. Appreciate hematology input and rxcs.     #9 Acute blood loss anemia (04/26/2011)   Assessment: s/p 1 unit PRBC with premedication without reaction.  #10 ECOLI UTI CHANGE IV rocephin to IV Zosyn antibiotic day #3  #11 Leukocytosis Likely secondary to UTI and pneumonia. Leukocytosis trending down. Change IV Rocephin 2 empiric IV Zosyn antibiotic day #3  #12 Pulmonary edema Repeat CXR with worsening edema.  Cycle cardiac enzymes with slightly elevated troponin felt to be secondary to A. fib..  2 d echo with normal EF and mild aortic stenosis. See #3  #13 Adynamic ileus Will make NPO.  We'll try to keep potassium greater than 4 and magnesium greater than 2. Replete electrolytes. Serial xrays.      #14 ETOH withdrawal (04/26/2011)   Assessment: CIWA-A protocol. No improvement on the IV Ativan. Will have PT see him evaluate to see with a candidate for Precedex.   Ramiro Harvest [phone]    LOS: 4 days

## 2011-04-29 NOTE — Procedures (Signed)
Central Venous Catheter Insertion Procedure Note [name] [id number] 1927-01-09  Procedure: Insertion of Central Venous Catheter Indications: Drug and/or fluid administration  Procedure Details Consent: Risks of procedure as well as the alternatives and risks of each were explained to the (patient/caregiver).  Consent for procedure obtained. Time Out: Verified patient identification, verified procedure, site/side was marked, verified correct patient position, special equipment/implants available, medications/allergies/relevent history reviewed, required imaging and test results available.  Performed  Maximum sterile technique was used including antiseptics, cap, gloves, gown, hand hygiene, mask and sheet. Skin prep: Chlorhexidine; local anesthetic administered A antimicrobial bonded/coated triple lumen catheter was placed in the left internal jugular vein using the Seldinger technique. Ultrasound guidance used.yes Catheter placed to 20 cm. Blood aspirated via all 3 ports and then flushed x 3. Line sutured x 2 and dressing applied.  Evaluation Blood flow good Complications: No apparent complications Patient did tolerate procedure well. Chest X-ray ordered to verify placement.  CXR: pending.  Brett Canales Minor ACNP [facility] PCCM Pager [phone] till 3 pm If no answer page [phone] 04/29/2011, 12:06 PM   I was present for the entire procedure. Shan Levans Beeper  [phone]  Cell  [phone]  If no response or cell goes to voicemail, call beeper [phone]

## 2011-04-29 NOTE — Procedures (Signed)
Intubation Procedure Note LUE DUBUQUE [id number] 06/01/1926  Procedure: Intubation Indications: Respiratory insufficiency  Procedure Details Consent: Risks of procedure as well as the alternatives and risks of each were explained to the (patient/caregiver).  Consent for procedure obtained. Time Out: Verified patient identification, verified procedure, site/side was marked, verified correct patient position, special equipment/implants available, medications/allergies/relevent history reviewed, required imaging and test results available.  Performed  4 Medications: IV  Fentanyl 100 mcg Etomidate 20 mg Versed 2 mg NMB    Evaluation Hemodynamic Status: Transient hypotension resolved spontaneously; O2 sats: stable throughout Patient's Current Condition: stable Complications: No apparent complications Patient did tolerate procedure well. Chest X-ray ordered to verify placement.  CXR: pending.   Brett Canales Minor ACNP [facility] Pollack PCCM Pager [phone] till 3 pm If no answer page [phone] 04/29/2011, 12:04 PM   I was present for this entire procedure. Shan Levans Beeper  [phone]  Cell  [phone]  If no response or cell goes to voicemail, call beeper [phone]

## 2011-04-29 NOTE — Progress Notes (Signed)
Subjective: Patient less responsive today. Medicine and Cardiology evaluations in progressed. Hematuria, light and without clots noted. Carboprost protocol to be d/c'd today.  Objective: Vital signs in last 24 hours: Temp:  [97.5 F (36.4 C)-99.5 F (37.5 C)] 97.7 F (36.5 C) (04/11 0800) Pulse Rate:  [54-184] 79  (04/11 0600) Resp:  [19-37] 19  (04/11 0600) BP: (81-176)/(53-109) 81/53 mmHg (04/11 0600) SpO2:  [94 %-100 %] 100 % (04/11 0600)A  Intake/Output from previous day: 04/10 0701 - 04/11 0700 In: 3568.8 [I.V.:618.8; IV Piggyback:250] Out: 9025 [Urine:9025] Intake/Output this shift:    Past Medical History  Diagnosis Date  . ADENOCARCINOMA, PROSTATE   . ALLERGIC RHINITIS   . B12 DEFICIENCY   . BENIGN PROSTATIC HYPERTROPHY   . CAROTID ARTERY DISEASE   . CEREBROVASCULAR ACCIDENT, HX OF   . CORONARY ARTERY DISEASE   . DIVERTICULOSIS, COLON   . GERD   . HEMORRHOIDS   . HIATAL HERNIA   . HYPERLIPIDEMIA   . HYPERTENSION   . OSTEOPENIA   . PEPTIC ULCER DISEASE   . PERIPHERAL VASCULAR DISEASE   . Personal history of alcoholism   . PROSTATE CANCER, HX OF   . PUD, HX OF   . TRANSIENT ISCHEMIC ATTACK   . Shingles   . Anemia   . AS (aortic stenosis)   . S/P AVR (aortic valve replacement) 09/23/2010  . Complication of anesthesia 09/23/2010    very confused after waking up. Stopped drinking 40 days before this surgery.  . Transfusion reaction, chill fever type 04/28/2011    Fever, rigors, tachycardia,tachypnea but no evidence of hemolysis  . Hemorrhagic cystitis 04/28/2011    Due to previous radiation for PSA recurrence of prostate cancer    Physical Exam:  General: thin male in mod respiratory distress Lungs - Normal respiratory effort, chest expands symmetrically.  Abdomen - Soft, non-tender & non-distended.  Lab Results:  Basename 04/29/11 0130 04/28/11 0335 04/27/11 0330  WBC 8.5 11.4* 19.0*  HGB 7.5* 8.2* 6.9*  HCT 22.2* 24.2* 20.5*   BMET  Basename  04/29/11 0130 04/28/11 0335  NA 144 141  K 3.5 3.3*  CL 112 110  CO2 20 19  GLUCOSE 115* 152*  BUN 41* 36*  CREATININE 1.53* 1.36*  CALCIUM 7.8* 7.9*   No results found for this basename: LABURIN:1 in the last 72 hours Results for orders placed during the hospital encounter of 04/25/11  URINE CULTURE     Status: Normal   Collection Time   04/25/11  4:24 AM      Component Value Range Status Comment   Specimen Description URINE, CATHETERIZED   Final    Special Requests NONE   Final    Culture  Setup Time [id number]   Final    Colony Count >=100,000 COLONIES/ML   Final    Culture ESCHERICHIA COLI   Final    Report Status 04/27/2011 FINAL   Final    Organism ID, Bacteria ESCHERICHIA COLI   Final   MRSA PCR SCREENING     Status: Normal   Collection Time   04/25/11  6:54 PM      Component Value Range Status Comment   MRSA by PCR NEGATIVE  NEGATIVE  Final     Studies/Results: @[id number] @  Assessment/Plan: Pt's wife called at 11pm to complain regarding her husband's treatment. She did not think Urology was doing enough to help her husband. She wanted to have Dr. Annabell Howells take over his case last night. It was explained to  her that Dr. Annabell Howells was unavailable last night, but she could switch to whatever doctor she made arrangements with.    I have re-explained to Mrs Seifer this mornining  that her husband  has been on the Carboprost protocol, and that he is not having gross bleeding with clot  from his bladder any more. He is not doing well, however,  with his transfusions, but with the help of Medicine and Hematology, he has been able to get his transfusions. His sensorium is still abnormal, and he for CT head today. I believe he has a guarded outlook, with his hx of ASVD and ETOH abuse.   Eithen Castiglia I 04/29/2011, 10:46 AM

## 2011-04-30 ENCOUNTER — Inpatient Hospital Stay (HOSPITAL_COMMUNITY): Payer: Medicare Other

## 2011-04-30 DIAGNOSIS — J9600 Acute respiratory failure, unspecified whether with hypoxia or hypercapnia: Secondary | ICD-10-CM

## 2011-04-30 DIAGNOSIS — J189 Pneumonia, unspecified organism: Secondary | ICD-10-CM

## 2011-04-30 LAB — CBC
HCT: 19.6 % — ABNORMAL LOW (ref 39.0–52.0)
Hemoglobin: 6.5 g/dL — CL (ref 13.0–17.0)
MCH: 29.5 pg (ref 26.0–34.0)
MCHC: 33.2 g/dL (ref 30.0–36.0)
MCV: 89.1 fL (ref 78.0–100.0)
RBC: 2.2 MIL/uL — ABNORMAL LOW (ref 4.22–5.81)

## 2011-04-30 LAB — MAGNESIUM: Magnesium: 2.2 mg/dL (ref 1.5–2.5)

## 2011-04-30 LAB — BASIC METABOLIC PANEL
BUN: 36 mg/dL — ABNORMAL HIGH (ref 6–23)
CO2: 21 mEq/L (ref 19–32)
GFR calc non Af Amer: 39 mL/min — ABNORMAL LOW (ref >90)
Glucose, Bld: 111 mg/dL — ABNORMAL HIGH (ref 70–99)
Potassium: 3 mEq/L — ABNORMAL LOW (ref 3.5–5.1)
Sodium: 152 mEq/L — ABNORMAL HIGH (ref 135–145)

## 2011-04-30 LAB — DIFFERENTIAL
Basophils Relative: 0 % (ref 0–1)
Eosinophils Relative: 0 % (ref 0–5)
Lymphs Abs: 0.6 10*3/uL — ABNORMAL LOW (ref 0.7–4.0)
Monocytes Absolute: 0.5 10*3/uL (ref 0.1–1.0)
Monocytes Relative: 11 % (ref 3–12)
Neutro Abs: 3.6 10*3/uL (ref 1.7–7.7)

## 2011-04-30 LAB — PATHOLOGIST SMEAR REVIEW

## 2011-04-30 MED ORDER — POTASSIUM CHLORIDE 20 MEQ/15ML (10%) PO LIQD
40.0000 meq | Freq: Once | ORAL | Status: AC
  Administered 2011-04-30: 40 meq
  Filled 2011-04-30: qty 30

## 2011-04-30 MED ORDER — JEVITY 1.2 CAL PO LIQD: 1000.0000 mL | ORAL | Status: DC

## 2011-04-30 MED ORDER — DIPHENHYDRAMINE HCL 50 MG/ML IJ SOLN
25.0000 mg | Freq: Four times a day (QID) | INTRAMUSCULAR | Status: DC | PRN
  Administered 2011-04-30 – 2011-05-06 (×3): 25 mg via INTRAVENOUS
  Filled 2011-04-30 (×2): qty 1

## 2011-04-30 MED ORDER — JEVITY 1.2 CAL PO LIQD
1000.0000 mL | ORAL | Status: DC
  Administered 2011-04-30 – 2011-05-02 (×2): 1000 mL
  Administered 2011-05-02: 06:00:00
  Administered 2011-05-03: 75 mL/h

## 2011-04-30 MED ORDER — FREE WATER
200.0000 mL | Freq: Four times a day (QID) | Status: DC
  Administered 2011-04-30 – 2011-05-03 (×13): 200 mL

## 2011-04-30 MED ORDER — PANTOPRAZOLE SODIUM 40 MG PO PACK
40.0000 mg | PACK | Freq: Every day | ORAL | Status: DC
  Administered 2011-04-30 – 2011-05-05 (×6): 40 mg
  Filled 2011-04-30 (×9): qty 20

## 2011-04-30 MED ORDER — METHYLPREDNISOLONE SODIUM SUCC 40 MG IJ SOLR
40.0000 mg | Freq: Once | INTRAMUSCULAR | Status: AC
  Administered 2011-04-30: 40 mg via INTRAVENOUS
  Filled 2011-04-30: qty 1

## 2011-04-30 MED ORDER — ACETAMINOPHEN 160 MG/5ML PO SOLN
650.0000 mg | Freq: Four times a day (QID) | ORAL | Status: DC | PRN
  Administered 2011-04-30 – 2011-05-08 (×3): 650 mg
  Filled 2011-04-30 (×3): qty 20.3

## 2011-04-30 NOTE — Progress Notes (Addendum)
Patient name: STILES MAXCY Medical record number: [id number] Date of birth: [birth year] Age: 78 y.o. Gender: male PCP: Oliver Barre, MD, MD  PCCM FU NOTE  Date: 04/30/2011 Reason for Consult: Acute transfusion reaction Referring Physician: Altha Harm, MD  History of Present Illness: 78 year old male with extensive PMH relevant for prostate cancer, active ETOH use, CVA, CAD, HTN, PVD, AS post valve replacement. 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 Critical care consult called by Dr. Ashley Royalty on 4/7 for evaluation of high grade fevers, chills , tachypnea during transfusion , recurred 4/8 >> attributed to minor non hemolytic reaction, tolerated transfusion 4/9 with pre-medication. Course complicated by ETOH withdrawal, e.coli UTI  & RVR 4/10 PCCM signed off. 4/11 PCCM reconsulted for agitation.Marland Kitchen  HCAP RLL on CXR, resp failure acute, intubated and tfr to PCCM .  Lines/tubes ETT 4/11>> CVL 4/11>>  Microbiology/Sepsis markers: 4/7 urine >> e. Coli S to ceftx 4/11 resp c/s >>> 4/11 blood >>  Abx ceftx 4/7 >>4/11 4/11 zoysn(HCAP)>> 4/11 Vanco (HCAP)>>  Events: CT HEAD 4/11>>no acute process    Subj: Sedated on drips, agitated on wua, clear urine.   Vital signs for last 24 hours: Temp:  [97.3 F (36.3 C)-102.2 F (39 C)] 102.2 F (39 C) (04/12 0400) Pulse Rate:  [43-111] 103  (04/12 0727) Resp:  [12-29] 15  (04/12 0727) BP: (86-153)/(40-76) 153/69 mmHg (04/12 0727) SpO2:  [94 %-100 %] 98 % (04/12 0749) FiO2 (%):  [30 %-100 %] 30 % (04/12 0749)  Intake/Output this shift:    Vent Mode:  [-] PRVC FiO2 (%):  [30 %-100 %] 30 % Set Rate:  [16 bmp-20 bmp] 16 bmp Vt Set:  [480 mL] 480 mL PEEP:  [5 cmH20] 5 cmH20 Plateau Pressure:  [12 cmH20-20 cmH20] 12 cmH20  Physical Exam:  General: int agitation,  intubated Eyes: Anicteric sclerae. ENT: Oropharynx clear. Moist mucous membranes. No thrush Lymph: No cervical, supraclavicular, or  axillary lymphadenopathy. Heart: .HSIR Lungs: coarse rhonchi, Tachypneic Abdomen: Abdomen soft, non-tender and not distended, normoactive bowel sounds. No hepatosplenomegaly or masses. Musculoskeletal: No clubbing or synovitis. Skin: No rashes or lesions Neuro: sedated RASS -2  LAB RESULT Results for orders placed during the hospital encounter of 04/25/11 (from the past 24 hour(s))  PROCALCITONIN     Status: Normal   Collection Time   04/29/11 12:45 PM      Component Value Range   Procalcitonin 8.34    LACTIC ACID, PLASMA     Status: Normal   Collection Time   04/29/11 12:45 PM      Component Value Range   Lactic Acid, Venous 1.3  0.5 - 2.2 (mmol/L)  BLOOD GAS, ARTERIAL     Status: Abnormal   Collection Time   04/29/11  1:16 PM      Component Value Range   FIO2 100.00     Delivery systems VENTILATOR     Mode PRESSURE REGULATED VOLUME CONTROL     VT 480     Rate 20     Peep/cpap 5.0     pH, Arterial 7.470 (*) 7.350 - 7.450    pCO2 arterial 28.1 (*) 35.0 - 45.0 (mmHg)   pO2, Arterial 370.0 (*) 80.0 - 100.0 (mmHg)   Bicarbonate 20.2  20.0 - 24.0 (mEq/L)   TCO2 19.4  0 - 100 (mmol/L)   Acid-base deficit 2.7 (*) 0.0 - 2.0 (mmol/L)   O2 Saturation 100.0     Patient temperature 98.6  Collection site LEFT BRACHIAL     Drawn by COLLECTED BY RT     Sample type ARTERIAL DRAW     Allens test (pass/fail) PASS  PASS   BASIC METABOLIC PANEL     Status: Abnormal   Collection Time   04/29/11  4:15 PM      Component Value Range   Sodium 148 (*) 135 - 145 (mEq/L)   Potassium 3.3 (*) 3.5 - 5.1 (mEq/L)   Chloride 117 (*) 96 - 112 (mEq/L)   CO2 21  19 - 32 (mEq/L)   Glucose, Bld 129 (*) 70 - 99 (mg/dL)   BUN 42 (*) 6 - 23 (mg/dL)   Creatinine, Ser 1.61 (*) 0.50 - 1.35 (mg/dL)   Calcium 7.8 (*) 8.4 - 10.5 (mg/dL)   GFR calc non Af Amer 43 (*) >90 (mL/min)   GFR calc Af Amer 49 (*) >90 (mL/min)  CULTURE, RESPIRATORY     Status: Normal (Preliminary result)   Collection Time   04/29/11   4:29 PM      Component Value Range   Specimen Description TRACHEAL ASPIRATE     Special Requests Immunocompromised     Gram Stain       Value: RARE WBC PRESENT,BOTH PMN AND MONONUCLEAR     RARE SQUAMOUS EPITHELIAL CELLS PRESENT     NO ORGANISMS SEEN   Culture PENDING     Report Status PENDING    BASIC METABOLIC PANEL     Status: Abnormal   Collection Time   04/30/11  4:45 AM      Component Value Range   Sodium 152 (*) 135 - 145 (mEq/L)   Potassium 3.0 (*) 3.5 - 5.1 (mEq/L)   Chloride 120 (*) 96 - 112 (mEq/L)   CO2 21  19 - 32 (mEq/L)   Glucose, Bld 111 (*) 70 - 99 (mg/dL)   BUN 36 (*) 6 - 23 (mg/dL)   Creatinine, Ser 0.96 (*) 0.50 - 1.35 (mg/dL)   Calcium 7.6 (*) 8.4 - 10.5 (mg/dL)   GFR calc non Af Amer 39 (*) >90 (mL/min)   GFR calc Af Amer 46 (*) >90 (mL/min)  CBC     Status: Abnormal   Collection Time   04/30/11  4:45 AM      Component Value Range   WBC 4.7  4.0 - 10.5 (K/uL)   RBC 2.20 (*) 4.22 - 5.81 (MIL/uL)   Hemoglobin 6.5 (*) 13.0 - 17.0 (g/dL)   HCT 04.5 (*) 40.9 - 52.0 (%)   MCV 89.1  78.0 - 100.0 (fL)   MCH 29.5  26.0 - 34.0 (pg)   MCHC 33.2  30.0 - 36.0 (g/dL)   RDW 81.1  91.4 - 78.2 (%)   Platelets 221  150 - 400 (K/uL)  MAGNESIUM     Status: Normal   Collection Time   04/30/11  4:45 AM      Component Value Range   Magnesium 2.2  1.5 - 2.5 (mg/dL)  DIFFERENTIAL     Status: Abnormal   Collection Time   04/30/11  4:45 AM      Component Value Range   Neutrophils Relative 76  43 - 77 (%)   Lymphocytes Relative 13  12 - 46 (%)   Monocytes Relative 11  3 - 12 (%)   Eosinophils Relative 0  0 - 5 (%)   Basophils Relative 0  0 - 1 (%)   Neutro Abs 3.6  1.7 - 7.7 (K/uL)   Lymphs  Abs 0.6 (*) 0.7 - 4.0 (K/uL)   Monocytes Absolute 0.5  0.1 - 1.0 (K/uL)   Eosinophils Absolute 0.0  0.0 - 0.7 (K/uL)   Basophils Absolute 0.0  0.0 - 0.1 (K/uL)   RBC Morphology POLYCHROMASIA PRESENT     WBC Morphology ATYPICAL MONONUCLEAR CELLS    TYPE AND SCREEN     Status: Normal  (Preliminary result)   Collection Time   04/30/11  6:20 AM      Component Value Range   ABO/RH(D) O NEG     Antibody Screen NEG     Sample Expiration 05/03/2011     Unit Number [id number]     Blood Component Type RED CELLS,LR     Unit division 00     Status of Unit ALLOCATED     Transfusion Status OK TO TRANSFUSE     Crossmatch Result Compatible     Unit Number [id number]     Blood Component Type RED CELLS,LR     Unit division 00     Status of Unit ALLOCATED     Transfusion Status OK TO TRANSFUSE     Crossmatch Result Compatible     Ct Head Wo Contrast  04/29/2011  *RADIOLOGY REPORT*  Clinical Data: Confusion with history of dementia and known prostate cancer.  Cheyne-Stokes respiration.  Previous aortic valve replacement. Previous right carotid endarterectomy.  CT HEAD WITHOUT CONTRAST  Technique:  Contiguous axial images were obtained from the base of the skull through the vertex without contrast.  Comparison: 09/28/2010 is most recent CT.  Previous MRI brain 04/21/2009.  Findings: The patient had difficulty remaining motionless for the study.  Images are suboptimal.  Small or subtle lesions could be overlooked.  There is no evidence for acute infarction, intracranial hemorrhage, mass lesion, hydrocephalus, or extra-axial fluid.  Moderate atrophy with chronic microvascular ischemic change is noted.  The patient has a history of left internal carotid artery occlusion with remote left MCA territory infarct which appears stable from priors.  Examination bone windows reveals no evidence for sclerotic or lytic lesions in the skull base or  calvarium to suggest metastatic prostate cancer.  Paranasal sinuses and mastoids are clear. Compared with priors a similar appearance was noted.  IMPRESSION: Motion degraded exam.  Atrophy and chronic microvascular ischemic change.  Remote left MCA territory infarct relates to chronic left internal carotid artery occlusion.  No visible  acute stroke or bleed.  No  gross evidence for metastatic disease.  Original Report Authenticated By: Elsie Stain, M.D.   Portable Chest Xray In Am  04/30/2011  *RADIOLOGY REPORT*  Clinical Data: Evaluate pneumonia.  PORTABLE CHEST - 1 VIEW  Comparison: 04/29/2011  Findings: ET tube approximately 5 cm above carina.  Bibasilar opacities, right greater than left, persist without significant improvement. Lung volumes are reduced.  Small right effusion.  No pneumothorax.  Nasogastric tube tip in stomach. Unremarkable osseous structures.  IMPRESSION: No significant improvement.  Slightly reduced lung volumes.  Original Report Authenticated By: Elsie Stain, M.D.   Portable Chest Xray  04/29/2011  *RADIOLOGY REPORT*  Clinical Data: Intubated.  Central line placement.  PORTABLE CHEST - 1 VIEW  Comparison: 04/26/2011  Findings: Endotracheal tube is 6 cm above the carina.  Left central line tip is in the SVC.  No pneumothorax.  Improving lung volumes. Continued right basilar atelectasis or infiltrate, favor atelectasis.  Improved aeration on the left without current confluent opacity.  No effusions.  Mild cardiomegaly.  IMPRESSION: Support devices in  expected position.  Improving aeration in the lung bases.  Mild residual right base atelectasis or infiltrate.  Original Report Authenticated By: Cyndie Chime, M.D.   Dg Abd Acute W/chest  04/29/2011  *RADIOLOGY REPORT*  Clinical Data: Edema, ileus, delirium tremens.  ACUTE ABDOMEN SERIES (ABDOMEN 2 VIEW & CHEST 1 VIEW)  Comparison: 04/28/2011.  Findings: Frontal view is slightly rotated.  Image quality is degraded by motion.  Trachea is midline.  Heart size stable.  Mild diffuse bilateral air space disease, with focality in the right lung base.  Two views of the abdomen show minimal gaseous prominence of small bowel and colon, as before.  IMPRESSION:  1.  Pulmonary edema with right basilar airspace consolidation. Superimposed pneumonia cannot be excluded. 2.  Stable mild gaseous distention  of small bowel and colon, favoring an ileus.  Original Report Authenticated By: Reyes Ivan, M.D.     Assessment:  ETOH abuse/ withdrawal with hematuria, presumed radiation cystitis with febrile transfusion reactions, intubated 4/11 for HCAP/poor mental status  Acute respiratory failure d/t RLL HCAP and severe debilitation Plan SBTs Goal RASS 0 to -1   Hosp acquired RLL PNA  With aspiration. Assessment: New RLL infiltrate, leukocytosis,  E.coli UTI S to ceftx Plan VAnco/zosyn empiric duw to clinical worsening F/u resp c/s & simplify abx pulm toilet, BD neb meds  . Alcohol abuse / withdrawal D/c ativan,  Start continuous sedation protocol - versed ok  Febrile non hemolytic transfusion reaction  - Premedicate with tylenol, benadryl, solumedrol prior to each transfusion   AF/RVR - per cards off diltiazem drip., Will cont amiod drip - change to po in 24 h now that he is back in nsR & ct for 4-6 wks  Gross hematuria secondary to radiation cystitis with prostate CA hx  - Management per urology  Hypokalemia  - replete, rpt 40 meq today & rechk in am , Mg ok  Hypernatremia - add free water  Severe protein calorie malnutrition Plan Start TFs    PCCM primary svc Full code TF later DVT proph - SCds PPI for SUP  Care during the described time interval was provided by me and/or other providers on the critical care team.  I have reviewed this patient's available data, including medical history, events of note, physical examination and test results as part of my evaluation  CC time x  35 mins   04/30/2011, 8:38 AM Mikele Sifuentes V.

## 2011-04-30 NOTE — Progress Notes (Signed)
Hgb 6.5, K+ 3.0, orders received for transfusion and K+ replacement VT. OGT replaced and secured, type and screen drawn. Dr Sherene Sires asked for orders for premed for transfusion, order for benadryl received.

## 2011-04-30 NOTE — Progress Notes (Signed)
INITIAL ADULT NUTRITION ASSESSMENT Date: 04/30/2011   Time: 10:35 AM Reason for Assessment: Consult, Vent, New TF  ASSESSMENT: Male 78 y.o.  Dx: gross hematuria secondary to radiation cystitis, ETOH withdrawal, e.coli UTI, acute respiratory failure d/t RLL HCAP and severe debilitation, PNA with aspiration, hypernatremia  Hx:  Past Medical History  Diagnosis Date  . ADENOCARCINOMA, PROSTATE   . ALLERGIC RHINITIS   . B12 DEFICIENCY   . BENIGN PROSTATIC HYPERTROPHY   . CAROTID ARTERY DISEASE   . CEREBROVASCULAR ACCIDENT, HX OF   . CORONARY ARTERY DISEASE   . DIVERTICULOSIS, COLON   . GERD   . HEMORRHOIDS   . HIATAL HERNIA   . HYPERLIPIDEMIA   . HYPERTENSION   . OSTEOPENIA   . PEPTIC ULCER DISEASE   . PERIPHERAL VASCULAR DISEASE   . Personal history of alcoholism   . PROSTATE CANCER, HX OF   . PUD, HX OF   . TRANSIENT ISCHEMIC ATTACK   . Shingles   . Anemia   . AS (aortic stenosis)   . S/P AVR (aortic valve replacement) 09/23/2010  . Complication of anesthesia 09/23/2010    very confused after waking up. Stopped drinking 40 days before this surgery.  . Transfusion reaction, chill fever type 04/28/2011    Fever, rigors, tachycardia,tachypnea but no evidence of hemolysis  . Hemorrhagic cystitis 04/28/2011    Due to previous radiation for PSA recurrence of prostate cancer   Past Surgical History  Procedure Date  . Popliteal synovial cyst excision   . Right carotid endarterectomy 1999    Left carotid total chronic occlusion  . Prostate surgery   . Cardic stent 2004  . Aortic valve replacement 09/23/2010    #70mm [facility] Ease pericardial tissue valve  . Coronary artery bypass graft 09/23/2010    CABG x1 using LIMA to LAD  . Cystoscopy with biopsy 02/15/2011    Procedure: CYSTOSCOPY WITH BIOPSY;  Surgeon: Anner Crete, MD;  Location: [facility];  Service: Urology;  Laterality: N/A;  Cystoscopy, biopsy and fulguration of the bladder    Related Meds: biotene, folic acid,  solumedrol, protonix, KCL,thiamine, vancocin  Ht: 5\' 11"  (180.3 cm)  Wt: 186 lb 15.2 oz (84.8 kg) Ideal Wt: 78 kg % Ideal Wt: 115  Usual Wt:  Wt Readings from Last 3 Encounters:  04/26/11 186 lb 15.2 oz (84.8 kg)  02/15/11 173 lb (78.472 kg)  02/15/11 173 lb (78.472 kg)     % Usual Wt:  108  Body mass index is 26.07 kg/(m^2).  Food/Nutrition Related Hx: unknown  Labs:  CMP     Component Value Date/Time   NA 152* 04/30/2011 0445   K 3.0* 04/30/2011 0445   CL 120* 04/30/2011 0445   CO2 21 04/30/2011 0445   GLUCOSE 111* 04/30/2011 0445   BUN 36* 04/30/2011 0445   CREATININE 1.56* 04/30/2011 0445   CREATININE 1.05 01/29/2011 1708   CALCIUM 7.6* 04/30/2011 0445   PROT 6.9 04/28/2011 0335   ALBUMIN 2.5* 04/28/2011 0335   AST 27 04/28/2011 0335   ALT 13 04/28/2011 0335   ALKPHOS 95 04/28/2011 0335   BILITOT 0.5 04/28/2011 0335   GFRNONAA 39* 04/30/2011 0445   GFRAA 46* 04/30/2011 0445    Intake: I/O last 3 completed shifts: In: 10025.4 [I.V.:4925.4; Other:4800; IV Piggyback:300] Out: 9110 [Urine:9110]    Diet Order:  NPO  Supplements/Tube Feeding:  Jevity 1.2 at 20 ml/hr per Adult Enteral Feeding Protocol  Free water:  200 ml every 6 hours  IVF:    sodium chloride Last Rate: 20 mL/hr at 04/29/11 1900  amiodarone (NEXTERONE PREMIX) 360 mg/200 mL dextrose Last Rate: 60 mg/hr (04/29/11 0900)  And   amiodarone (NEXTERONE PREMIX) 360 mg/200 mL dextrose Last Rate: 30 mg/hr (04/30/11 0700)  feeding supplement (JEVITY 1.2 CAL)   fentaNYL infusion INTRAVENOUS Last Rate: 100 mcg/hr (04/30/11 0700)  midazolam (VERSED) infusion Last Rate: 2 mg/hr (04/30/11 0700)  DISCONTD: carboprost (HEMABATE) bladder irrigation - continuous   DISCONTD: diltiazem (CARDIZEM) infusion Last Rate: Stopped (04/29/11 1500)    Estimated Nutritional Needs:  Per day   Kcal: 2082-2290 Protein: 100-115g Fluid: >2L  Pt with ETOH abuse prior to admit.  Diet hx unknown but expect<75% energy intake for >5 days  and noted moderate muscle mass depletion on examination.  Based on this, pt meets criteria for Severe Malnutrition (Acute).    NUTRITION DIAGNOSIS: -Inadequate oral intake (NI-2.1).  Status: Ongoing  RELATED TO: vent  AS EVIDENCE BY: NPO status  MONITORING/EVALUATION(Goals): Monitor:  Weight trend, TF tolerance, labs, change in status  EDUCATION NEEDS: -No education needs identified at this time  INTERVENTION: 1.  Begin Jevity 1.2  Per OG tube at 20 ml/hr.  Increase 10 ml every 8 hours to goal of 75 ml/hr to provide 2016 kcal, 93 g protein, `458 ml free water daily at goal. 2.  Add prostat 30 ml daily (15g protein, 72 kcals) 3.  Continue Free water 4.  Pt at risk for refeeding syndrome.  Rec monitoring of mg, K+ and Phos.  Dietitian # [phone]  DOCUMENTATION CODES Per approved criteria  -Severe malnutrition in the context of acute illness or injury    Amaan Meyer, Anastasia Fiedler 04/30/2011, 10:35 AM

## 2011-04-30 NOTE — Progress Notes (Signed)
@   Subjective:  Intubated and unresponsive   Objective:  Filed Vitals:   04/30/11 0500 04/30/11 0600 04/30/11 0727 04/30/11 0749  BP: 106/48 122/54 153/69   Pulse: 104 87 103   Temp:      TempSrc:      Resp: 16 15 15    Height:      Weight:      SpO2: 96% 97% 98% 98%    Intake/Output from previous day:  Intake/Output Summary (Last 24 hours) at 04/30/11 0811 Last data filed at 04/30/11 0700  Gross per 24 hour  Intake 5291.12 ml  Output   3210 ml  Net 2081.12 ml    Physical Exam: Physical exam: Well-developed, intubated Skin is warm and dry.  HEENT is normal.  Chest is clear to auscultation anteriorly Cardiovascular exam is irregular Abdominal exam nontender or distended. No masses palpated. Extremities show no edema. neuro not assessed    Lab Results: Basic Metabolic Panel:  Basename 04/30/11 0445 04/29/11 1615 04/29/11 0130  NA 152* 148* --  K 3.0* 3.3* --  CL 120* 117* --  CO2 21 21 --  GLUCOSE 111* 129* --  BUN 36* 42* --  CREATININE 1.56* 1.46* --  CALCIUM 7.6* 7.8* --  MG 2.2 -- 2.1  PHOS -- -- --   CBC:  Basename 04/30/11 0445 04/29/11 0130  WBC 4.7 8.5  NEUTROABS 3.6 6.6  HGB 6.5* 7.5*  HCT 19.6* 22.2*  MCV 89.1 88.4  PLT 221 279   Cardiac Enzymes:  Basename 04/29/11 0130 04/28/11 1625 04/28/11 1002  CKTOTAL 186 271* 262*  CKMB 2.3 4.2* 4.4*  CKMBINDEX -- -- --  TROPONINI 0.38* <0.30 0.40*     Assessment/Plan:  1) atrial fibrillation - patient back in sinus; afib most likely being driven by ETOH withdrawal, aspiration pneumonia, UTI and anemia; LV function normal; continue IV amiodarone. Patient cannot be anticoagulated given hematuria and anemia (also ETOH withdrawal). Probably can DC amiodarone in 4-6 weeks if other acute issues improve 2) Mild elevation in troponin - most likely related to atrial fibrillation; LV function normal; no plans for further ischemia eval 3) UTI/aspiration pneumonia - continue antibiotics 4)  hematuria/acute blood loss anemia - Needs transfusion; will leave to primary service 5) acute on chronic renal insuff/hypernatremia - follow up BMET in AM 6) CAD (s/p CABG) 7) ETOH withdrawal - management per primary team 8) VDRF - CCM managing   Olga Millers 04/30/2011, 8:11 AM

## 2011-04-30 NOTE — Progress Notes (Signed)
Subjective: Gross hematuria: resolved. Off Carboprost protocol.  Remains  intubated, 2ndary respiratory failure, pneumonia. Also hx ASVD, remote hx of bladder/prostate cancer with radiation hemorrhagic cystitis.   Objective: Vital signs in last 24 hours: Temp:  [97.3 F (36.3 C)-102.2 F (39 C)] 101.6 F (38.7 C) (04/12 0800) Pulse Rate:  [43-111] 103  (04/12 0727) Resp:  [12-20] 15  (04/12 0727) BP: (86-153)/(40-76) 153/69 mmHg (04/12 0727) SpO2:  [94 %-100 %] 98 % (04/12 0749) FiO2 (%):  [30 %-100 %] 30 % (04/12 0749)A  Intake/Output from previous day: 04/11 0701 - 04/12 0700 In: 6105.4 [I.V.:4455.4; IV Piggyback:300] Out: 3760 [Urine:3760] Intake/Output this shift:    Past Medical History  Diagnosis Date  . ADENOCARCINOMA, PROSTATE   . ALLERGIC RHINITIS   . B12 DEFICIENCY   . BENIGN PROSTATIC HYPERTROPHY   . CAROTID ARTERY DISEASE   . CEREBROVASCULAR ACCIDENT, HX OF   . CORONARY ARTERY DISEASE   . DIVERTICULOSIS, COLON   . GERD   . HEMORRHOIDS   . HIATAL HERNIA   . HYPERLIPIDEMIA   . HYPERTENSION   . OSTEOPENIA   . PEPTIC ULCER DISEASE   . PERIPHERAL VASCULAR DISEASE   . Personal history of alcoholism   . PROSTATE CANCER, HX OF   . PUD, HX OF   . TRANSIENT ISCHEMIC ATTACK   . Shingles   . Anemia   . AS (aortic stenosis)   . S/P AVR (aortic valve replacement) 09/23/2010  . Complication of anesthesia 09/23/2010    very confused after waking up. Stopped drinking 40 days before this surgery.  . Transfusion reaction, chill fever type 04/28/2011    Fever, rigors, tachycardia,tachypnea but no evidence of hemolysis  . Hemorrhagic cystitis 04/28/2011    Due to previous radiation for PSA recurrence of prostate cancer    Physical Exam:  General: thin w m, intubated.  Lungs - on ventilator. Abdomen - Soft, non-tender & non-distended. GU: Foley in place. Urine clear.   Lab Results:  Basename 04/30/11 0445 04/29/11 0130 04/28/11 0335  WBC 4.7 8.5 11.4*  HGB 6.5*  7.5* 8.2*  HCT 19.6* 22.2* 24.2*   BMET  Basename 04/30/11 0445 04/29/11 1615  NA 152* 148*  K 3.0* 3.3*  CL 120* 117*  CO2 21 21  GLUCOSE 111* 129*  BUN 36* 42*  CREATININE 1.56* 1.46*  CALCIUM 7.6* 7.8*   No results found for this basename: LABURIN:1 in the last 72 hours Results for orders placed during the hospital encounter of 04/25/11  URINE CULTURE     Status: Normal   Collection Time   04/25/11  4:24 AM      Component Value Range Status Comment   Specimen Description URINE, CATHETERIZED   Final    Special Requests NONE   Final    Culture  Setup Time [id number]   Final    Colony Count >=100,000 COLONIES/ML   Final    Culture ESCHERICHIA COLI   Final    Report Status 04/27/2011 FINAL   Final    Organism ID, Bacteria ESCHERICHIA COLI   Final   MRSA PCR SCREENING     Status: Normal   Collection Time   04/25/11  6:54 PM      Component Value Range Status Comment   MRSA by PCR NEGATIVE  NEGATIVE  Final   CULTURE, RESPIRATORY     Status: Normal (Preliminary result)   Collection Time   04/29/11  4:29 PM      Component Value Range Status  Comment   Specimen Description TRACHEAL ASPIRATE   Final    Special Requests Immunocompromised   Final    Gram Stain     Final    Value: RARE WBC PRESENT,BOTH PMN AND MONONUCLEAR     RARE SQUAMOUS EPITHELIAL CELLS PRESENT     NO ORGANISMS SEEN   Culture PENDING   Incomplete    Report Status PENDING   Incomplete     Studies/Results: @[id number] @  Assessment/Plan: Resolved gross hematuria. May now use CBI prn.   Efrata Brunner I 04/30/2011, 11:46 AM

## 2011-04-30 NOTE — Progress Notes (Signed)
Intubated on 04/29/2011 and placed on mechanical ventilation FIO2 30%, on amiodarone drip for elevated HR, fentanyl drip at 100 mcg/hr, versed drip at 2mg /hr. Continuous bladder irrigation, no clots noted in urine. Boluses of pain and sedation meds given with repositioning and suctioning prn due to agitation threatening vent compliance, attempting to pull off vent, lines, and monitor. BP stable, no pressors.

## 2011-05-01 ENCOUNTER — Inpatient Hospital Stay (HOSPITAL_COMMUNITY): Payer: Medicare Other

## 2011-05-01 DIAGNOSIS — D649 Anemia, unspecified: Secondary | ICD-10-CM

## 2011-05-01 DIAGNOSIS — N179 Acute kidney failure, unspecified: Secondary | ICD-10-CM

## 2011-05-01 LAB — BASIC METABOLIC PANEL
CO2: 22 mEq/L (ref 19–32)
Calcium: 8 mg/dL — ABNORMAL LOW (ref 8.4–10.5)
Creatinine, Ser: 1.25 mg/dL (ref 0.50–1.35)
GFR calc non Af Amer: 51 mL/min — ABNORMAL LOW (ref >90)
Glucose, Bld: 122 mg/dL — ABNORMAL HIGH (ref 70–99)
Sodium: 150 mEq/L — ABNORMAL HIGH (ref 135–145)

## 2011-05-01 LAB — CBC
MCHC: 33.2 g/dL (ref 30.0–36.0)
RBC: 2.57 MIL/uL — ABNORMAL LOW (ref 4.22–5.81)
WBC: 8.6 10*3/uL (ref 4.0–10.5)

## 2011-05-01 MED ORDER — CHLORHEXIDINE GLUCONATE 0.12 % MT SOLN
15.0000 mL | Freq: Four times a day (QID) | OROMUCOSAL | Status: DC
  Administered 2011-05-01 – 2011-05-11 (×40): 15 mL via OROMUCOSAL
  Filled 2011-05-01 (×45): qty 15

## 2011-05-01 MED ORDER — FUROSEMIDE 10 MG/ML IJ SOLN
20.0000 mg | Freq: Once | INTRAMUSCULAR | Status: AC
  Administered 2011-05-01: 20 mg via INTRAVENOUS
  Filled 2011-05-01: qty 2

## 2011-05-01 MED ORDER — DEXTROSE 5 % IV SOLN
INTRAVENOUS | Status: DC
  Administered 2011-05-01: 75 mL via INTRAVENOUS
  Administered 2011-05-02: via INTRAVENOUS

## 2011-05-01 NOTE — Progress Notes (Addendum)
Subjective:  Remains sedated and intubated  Objective:  Vital Signs in the last 24 hours: BP 129/60  Pulse 79  Temp(Src) 99 F (37.2 C) (Oral)  Resp 13  Ht 5\' 11"  (1.803 m)  Wt 72.1 kg (158 lb 15.2 oz)  BMI 22.17 kg/m2  SpO2 100%  Physical Exam: Thin BM intubated, not responsive secondary to sedation Lungs: rhonchi bilat Cardiac:  Regular rhythm, normal S1 and S2, no S3 2/6 systolic murmr Abdomen:  Soft, nontender, no masses Extremities:  No edema present  Intake/Output from previous day: 04/12 0701 - 04/13 0700 In: 2285.6 [I.V.:1283.1; Blood:362.5; NG/GT:640] Out: 1760 [Urine:1760]  Weight Filed Weights   04/25/11 0206 04/26/11 0100 05/01/11 0500  Weight: 78.472 kg (173 lb) 84.8 kg (186 lb 15.2 oz) 72.1 kg (158 lb 15.2 oz)    Lab Results: Basic Metabolic Panel:  Basename 05/01/11 0415 04/30/11 0445  NA 150* 152*  K 3.5 3.0*  CL 121* 120*  CO2 22 21  GLUCOSE 122* 111*  BUN 26* 36*  CREATININE 1.25 1.56*   CBC:  Basename 05/01/11 0415 04/30/11 0445 04/29/11 0130  WBC 8.6 4.7 --  NEUTROABS -- 3.6 6.6  HGB 7.7* 6.5* --  HCT 23.2* 19.6* --  MCV 90.3 89.1 --  PLT 167 221 --   BNP    Component Value Date/Time   PROBNP 11811.0* 04/29/2011 0130   Telemetry: Currently sinus.  Occasional runs of VT.  Assessment/Plan:  1) atrial fibrillation - patient back in sinus; afib most likely being driven by ETOH withdrawal, aspiration pneumonia, UTI and anemia;  2) Mild elevation in troponin - most likely related to atrial fibrillation; LV function normal; no plans for further ischemia eval  3) UTI/aspiration pneumonia - continue antibiotics  4)Transfusion reaction  5) acute on chronic renal insuff/hypernatremia - follow up BMET in AM  6) CAD (s/p CABG)  7) ETOH withdrawal - management per primary team  8) VDRF - CCM managing  Rec:  Keep on IV amiodarone b/o occasion VT.  Correct electrolytes per CCM.  Darden Palmer  MD [facility]  05/01/2011,  9:32 AM

## 2011-05-01 NOTE — Progress Notes (Signed)
Patient ID: [name], male   DOB: [birth year], 78 y.o.   MRN: [id number]   Subjective: Patient remains intubated and ventilated. His urine remains clear at this time.  Objective: Vital signs in last 24 hours: Temp:  [98.6 F (37 C)-100.3 F (37.9 C)] 99.2 F (37.3 C) (04/13 0400) Pulse Rate:  [76-100] 77  (04/13 0700) Resp:  [11-20] 15  (04/13 0700) BP: (110-144)/(54-80) 116/61 mmHg (04/13 0700) SpO2:  [98 %-100 %] 100 % (04/13 0739) FiO2 (%):  [30 %] 30 % (04/13 0800) Weight:  [72.1 kg (158 lb 15.2 oz)] 72.1 kg (158 lb 15.2 oz) (04/13 0500)  Intake/Output from previous day: 04/12 0701 - 04/13 0700 In: 2086.7 [I.V.:1084.2; Blood:362.5; NG/GT:640] Out: 1760 [Urine:1760] Intake/Output this shift:    Physical Exam:  Constitutional: Vital signs reviewed. WD WN in NAD     Cardiovascular: RRR Pulmonary/Chest: Mechanical ventilation Abdominal: Soft. Non-tender, non-distended, bowel sounds are normal, no masses, organomegaly, or guarding present.  Genitourinary: Foley catheter in place with drainage of clear urine. Extremities: No cyanosis or edema   Lab Results:  Basename 05/01/11 0415 04/30/11 0445 04/29/11 0130  HGB 7.7* 6.5* 7.5*  HCT 23.2* 19.6* 22.2*   BMET  Basename 05/01/11 0415 04/30/11 0445  NA 150* 152*  K 3.5 3.0*  CL 121* 120*  CO2 22 21  GLUCOSE 122* 111*  BUN 26* 36*  CREATININE 1.25 1.56*  CALCIUM 8.0* 7.6*   No results found for this basename: LABPT:3,INR:3 in the last 72 hours No results found for this basename: LABURIN:1 in the last 72 hours Results for orders placed during the hospital encounter of 04/25/11  URINE CULTURE     Status: Normal   Collection Time   04/25/11  4:24 AM      Component Value Range Status Comment   Specimen Description URINE, CATHETERIZED   Final    Special Requests NONE   Final    Culture  Setup Time [id number]   Final    Colony Count >=100,000 COLONIES/ML   Final    Culture ESCHERICHIA COLI   Final    Report Status  04/27/2011 FINAL   Final    Organism ID, Bacteria ESCHERICHIA COLI   Final   MRSA PCR SCREENING     Status: Normal   Collection Time   04/25/11  6:54 PM      Component Value Range Status Comment   MRSA by PCR NEGATIVE  NEGATIVE  Final   CULTURE, RESPIRATORY     Status: Normal (Preliminary result)   Collection Time   04/29/11  4:29 PM      Component Value Range Status Comment   Specimen Description TRACHEAL ASPIRATE   Final    Special Requests Immunocompromised   Final    Gram Stain     Final    Value: RARE WBC PRESENT,BOTH PMN AND MONONUCLEAR     RARE SQUAMOUS EPITHELIAL CELLS PRESENT     NO ORGANISMS SEEN   Culture PENDING   Incomplete    Report Status PENDING   Incomplete     Studies/Results: Ct Head Wo Contrast  04/29/2011  *RADIOLOGY REPORT*  Clinical Data: Confusion with history of dementia and known prostate cancer.  Cheyne-Stokes respiration.  Previous aortic valve replacement. Previous right carotid endarterectomy.  CT HEAD WITHOUT CONTRAST  Technique:  Contiguous axial images were obtained from the base of the skull through the vertex without contrast.  Comparison: 09/28/2010 is most recent CT.  Previous MRI brain 04/21/2009.  Findings:  The patient had difficulty remaining motionless for the study.  Images are suboptimal.  Small or subtle lesions could be overlooked.  There is no evidence for acute infarction, intracranial hemorrhage, mass lesion, hydrocephalus, or extra-axial fluid.  Moderate atrophy with chronic microvascular ischemic change is noted.  The patient has a history of left internal carotid artery occlusion with remote left MCA territory infarct which appears stable from priors.  Examination bone windows reveals no evidence for sclerotic or lytic lesions in the skull base or  calvarium to suggest metastatic prostate cancer.  Paranasal sinuses and mastoids are clear. Compared with priors a similar appearance was noted.  IMPRESSION: Motion degraded exam.  Atrophy and chronic  microvascular ischemic change.  Remote left MCA territory infarct relates to chronic left internal carotid artery occlusion.  No visible  acute stroke or bleed.  No gross evidence for metastatic disease.  Original Report Authenticated By: Elsie Stain, M.D.   Dg Chest Port 1 View  05/01/2011  *RADIOLOGY REPORT*  Clinical Data: Follow up pneumonia.  Endotracheal tube placement.  PORTABLE CHEST - 1 VIEW  Comparison: 04/30/2011 and 04/29/2011.  Findings: 0533 hours.  The endotracheal tube, central line and nasogastric tube appear unchanged in position.  Several telemetry leads overlie the chest.  There is bibasilar atelectasis and mild vascular congestion.  A small amount of pleural fluid is likely bilaterally.  No pneumothorax is seen.  Heart size and mediastinal contours stable status post aortic valve replacement.  IMPRESSION: Stable examination.  Patchy basilar opacities are radiographically most consistent with atelectasis.  Original Report Authenticated By: Gerrianne Scale, M.D.   Portable Chest Xray In Am  04/30/2011  *RADIOLOGY REPORT*  Clinical Data: Evaluate pneumonia.  PORTABLE CHEST - 1 VIEW  Comparison: 04/29/2011  Findings: ET tube approximately 5 cm above carina.  Bibasilar opacities, right greater than left, persist without significant improvement. Lung volumes are reduced.  Small right effusion.  No pneumothorax.  Nasogastric tube tip in stomach. Unremarkable osseous structures.  IMPRESSION: No significant improvement.  Slightly reduced lung volumes.  Original Report Authenticated By: Elsie Stain, M.D.   Portable Chest Xray  04/29/2011  *RADIOLOGY REPORT*  Clinical Data: Intubated.  Central line placement.  PORTABLE CHEST - 1 VIEW  Comparison: 04/26/2011  Findings: Endotracheal tube is 6 cm above the carina.  Left central line tip is in the SVC.  No pneumothorax.  Improving lung volumes. Continued right basilar atelectasis or infiltrate, favor atelectasis.  Improved aeration on the left  without current confluent opacity.  No effusions.  Mild cardiomegaly.  IMPRESSION: Support devices in expected position.  Improving aeration in the lung bases.  Mild residual right base atelectasis or infiltrate.  Original Report Authenticated By: Cyndie Chime, M.D.    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 On going plan per Dr. Patsi Sears.   LOS: 6 days   Lashika Erker S 05/01/2011, 9:13 AM

## 2011-05-01 NOTE — Progress Notes (Addendum)
Patient name: [name] Medical record number: [id number] Date of birth: Nov 28, 1926 Age: 78 y.o. Gender: male PCP: Oliver Barre, MD, MD   PCCM FU NOTE  Reason for Consult: Acute transfusion reaction Referring Physician: Altha Harm, MD  Brief patient profile: 64 yowm with prostate cancer, active ETOH use, CVA, CAD, HTN, PVD, AS post valve replacement. Adm 4/7 with gross hematuria secondary to radiation cystitis. Critical care consult called by Dr. Ashley Royalty on 4/7 for evaluation of high grade fevers, chills , tachypnea during transfusion , recurred 4/8 >> attributed to minor non hemolytic reaction, tolerated transfusion 4/9 with pre-medication. Course complicated by ETOH withdrawal, e.coli UTI  & RVR 4/10 PCCM signed off. 4/11 PCCM reconsulted for agitation.Marland Kitchen  HCAP RLL on CXR, resp failure acute, intubated and tfr to PCCM .  Lines/tubes ETT 4/11>> CVL L IJ 4/11>>  Microbiology/Sepsis markers: 4/7 urine >> e. Coli S to ceftx 4/11 resp c/s > rare wbc, no org seen >>> 4/11 blood >>  Abx ceftx 4/7 >>4/11 4/11 zoysn(HCAP)>> 4/11 Vanco (HCAP)>>  Events: CT HEAD 4/11>>no acute process    Subj: Sedated on drips, synchronous with vent    Vital signs for last 24 hours: Temp:  [98.6 F (37 C)-101.6 F (38.7 C)] 99.2 F (37.3 C) (04/13 0400) Pulse Rate:  [76-101] 77  (04/13 0700) Resp:  [11-20] 15  (04/13 0700) BP: (110-144)/(54-80) 116/61 mmHg (04/13 0700) SpO2:  [98 %-100 %] 100 % (04/13 0739) FiO2 (%):  [30 %] 30 % (04/13 0739) Weight:  [158 lb 15.2 oz (72.1 kg)] 158 lb 15.2 oz (72.1 kg) (04/13 0500)     . sodium chloride 20 mL/hr at 04/29/11 1900  . amiodarone (NEXTERONE PREMIX) 360 mg/200 mL dextrose 30 mg/hr (05/01/11 0700)  . feeding supplement (JEVITY 1.2 CAL) 1,000 mL (04/30/11 1148)  . fentaNYL infusion INTRAVENOUS 50 mcg/hr (05/01/11 0700)  . midazolam (VERSED) infusion 1 mg/hr (05/01/11 0700)  . DISCONTD: feeding supplement (JEVITY 1.2 CAL)        Intake/Output Summary (Last 24 hours) at 05/01/11 0800 Last data filed at 05/01/11 0700  Gross per 24 hour  Intake   2038 ml  Output   1460 ml  Net    578 ml    CVP:  [12 mmHg-15 mmHg] 15 mmHg       Vent Mode:  [-] PRVC FiO2 (%):  [30 %] 30 % Set Rate:  [16 bmp] 16 bmp Vt Set:  [480 mL] 480 mL PEEP:  [5 cmH20] 5 cmH20 Pressure Support:  [8 cmH20] 8 cmH20 Plateau Pressure:  [7 cmH20-19 cmH20] 8 cmH20  Physical Exam:  General  Intubated sdedate Eyes: Anicteric sclerae. ENT: Oropharynx clear. Moist mucous membranes. No thrush Lymph: No cervical, supraclavicular, or axillary lymphadenopathy. Heart: .HSIR Lungs: coarse rhonchi  Abdomen: Abdomen soft, non-tender and not distended, normoactive bowel sounds. No hepatosplenomegaly or masses. Musculoskeletal: No clubbing or synovitis. Skin: No rashes or lesions Neuro: sedated   LAB RESULT  Lab 05/01/11 0415 04/30/11 0445 04/29/11 1615  NA 150* 152* 148*  K 3.5 3.0* 3.3*  CL 121* 120* 117*  CO2 22 21 21   BUN 26* 36* 42*  CREATININE 1.25 1.56* 1.46*  GLUCOSE 122* 111* 129*    Lab 05/01/11 0415 04/30/11 0445 04/29/11 0130  HGB 7.7* 6.5* 7.5*  HCT 23.2* 19.6* 22.2*  WBC 8.6 4.7 8.5  PLT 167 221 279     Ct Head Wo Contrast  04/29/2011  *RADIOLOGY REPORT*  Clinical Data: Confusion with history  of dementia and known prostate cancer.  Cheyne-Stokes respiration.  Previous aortic valve replacement. Previous right carotid endarterectomy.  CT HEAD WITHOUT CONTRAST  Technique:    IMPRESSION: Motion degraded exam.  Atrophy and chronic microvascular ischemic change.  Remote left MCA territory infarct relates to chronic left internal carotid artery occlusion.  No visible  acute stroke or bleed.  No gross evidence for metastatic disease.  Original Report Authenticated By: Elsie Stain, M.D.      04/30/2011  *RADIOLOGY REPORT*  Clinical Data: Evaluate pneumonia.  PORTABLE CHEST - 1 VIEW  Comparison: 04/29/2011  Findings: ET tube  approximately 5 cm above carina.  Bibasilar opacities, right greater than left, persist without significant improvement. Lung volumes are reduced.  Small right effusion.  No pneumothorax.  Nasogastric tube tip in stomach. Unremarkable osseous structures.  IMPRESSION: No significant improvement.  Slightly reduced lung volumes.  Original Report Authenticated By: Elsie Stain, M.D.   Portable Chest Xray  04/29/2011  *RADIOLOGY REPORT*  Clinical Data: Intubated.  Central line placement.  PORTABLE CHEST - 1 VIEW  Comparison: 04/26/2011  Findings: Endotracheal tube is 6 cm above the carina.  Left central line tip is in the SVC.  No pneumothorax.  Improving lung volumes. Continued right basilar atelectasis or infiltrate, favor atelectasis.  Improved aeration on the left without current confluent opacity.  No effusions.  Mild cardiomegaly.  IMPRESSION: Support devices in expected position.  Improving aeration in the lung bases.  Mild residual right base atelectasis or infiltrate.  Original Report Authenticated By: Cyndie Chime, M.D.     Assessment:  ETOH abuse/ withdrawal with hematuria, presumed radiation cystitis with febrile transfusion reactions, intubated 4/11 for HCAP/poor mental status  Acute respiratory failure d/t RLL HCAP and severe debilitation Plan SBTs Goal RASS 0 to -1   Hosp acquired RLL PNA  With aspiration. Assessment: New RLL infiltrate, leukocytosis,  E.coli UTI S to ceftx Plan VAnco/zosyn empiric due to clinical worsening 4/11 per dashboard F/u resp c/s & simplify abx pulm toilet, BD neb meds  . Alcohol abuse / withdrawal continuous sedation protocol - versed ok  Febrile non hemolytic transfusion reaction  - Premedicate with tylenol, benadryl, solumedrol prior to each transfusion   AF/RVR - per cards off diltiazem drip., Will cont amiod drip -  ct for 4-6 wks but there is risk of undetectable lung injury here since his baseline is abn cxr  Gross hematuria secondary to  radiation cystitis with prostate CA hx  - Management per urology  Hypokalemia  - replete, rpt 40 meq today & rechk in am , Mg ok  Hypernatremia - continue  free water ? Vol overloaded 4/13 so change maint IV to D5W and diures  Acute renal failure Lab Results  Component Value Date   CREATININE 1.25 05/01/2011   CREATININE 1.56* 04/30/2011   CREATININE 1.46* 04/29/2011   CREATININE 1.05 01/29/2011   Resolving so ok to diures 4/13  Severe protein calorie malnutrition Plan TF's a@ 50 to target planned 4/13   PCCM primary svc Full code  DVT proph - SCds PPI for SUP  Care during the described time interval was provided by me and/or other providers on the critical care team.  I have reviewed this patient's available data, including medical history, events of note, physical examination and test results as part of my evaluation  CC time x  35 mins     Sandrea Hughs, MD Pulmonary and Critical Care Medicine [facility] Cell [phone]

## 2011-05-01 NOTE — Progress Notes (Addendum)
79 yo male with complex medical hx, RRT from floor 4/7, hx of prostate Ca, s/p radiation tx, alcoholic encephalopathy, anemia with recent Hgb 6.5, received blood transfusion with questionable mild hemolytic reaction?/ sensitivity. Recently transfused after premedication without any difficulty. Episodic non-sustained Vtach. Sedated on 1mg  versed, fentanyl 50 mcg/hr. Remains on amiodarone drip at 30 mg/hr, CVP 10. Continuous tubefeedings via OGT well tolerated.

## 2011-05-02 ENCOUNTER — Inpatient Hospital Stay (HOSPITAL_COMMUNITY): Payer: Medicare Other

## 2011-05-02 DIAGNOSIS — E870 Hyperosmolality and hypernatremia: Secondary | ICD-10-CM

## 2011-05-02 LAB — BASIC METABOLIC PANEL
BUN: 23 mg/dL (ref 6–23)
CO2: 24 mEq/L (ref 19–32)
Chloride: 114 mEq/L — ABNORMAL HIGH (ref 96–112)
GFR calc non Af Amer: 54 mL/min — ABNORMAL LOW (ref >90)
Glucose, Bld: 169 mg/dL — ABNORMAL HIGH (ref 70–99)
Potassium: 3.1 mEq/L — ABNORMAL LOW (ref 3.5–5.1)

## 2011-05-02 LAB — CULTURE, RESPIRATORY W GRAM STAIN

## 2011-05-02 LAB — GLUCOSE, CAPILLARY: Glucose-Capillary: 159 mg/dL — ABNORMAL HIGH (ref 70–99)

## 2011-05-02 MED ORDER — IPRATROPIUM BROMIDE 0.02 % IN SOLN
0.5000 mg | Freq: Four times a day (QID) | RESPIRATORY_TRACT | Status: DC
  Administered 2011-05-02 – 2011-05-08 (×24): 0.5 mg via RESPIRATORY_TRACT
  Filled 2011-05-02 (×23): qty 2.5

## 2011-05-02 MED ORDER — LEVALBUTEROL HCL 0.63 MG/3ML IN NEBU
0.6300 mg | INHALATION_SOLUTION | Freq: Four times a day (QID) | RESPIRATORY_TRACT | Status: DC
  Administered 2011-05-02 – 2011-05-08 (×25): 0.63 mg via RESPIRATORY_TRACT
  Filled 2011-05-02 (×30): qty 3

## 2011-05-02 MED ORDER — INSULIN ASPART 100 UNIT/ML ~~LOC~~ SOLN
0.0000 [IU] | SUBCUTANEOUS | Status: DC
  Administered 2011-05-02: 3 [IU] via SUBCUTANEOUS
  Administered 2011-05-03: 2 [IU] via SUBCUTANEOUS
  Administered 2011-05-03: via SUBCUTANEOUS
  Administered 2011-05-03 (×4): 3 [IU] via SUBCUTANEOUS
  Administered 2011-05-03 – 2011-05-04 (×2): 2 [IU] via SUBCUTANEOUS
  Administered 2011-05-04 (×2): 3 [IU] via SUBCUTANEOUS
  Administered 2011-05-04 – 2011-05-05 (×4): 2 [IU] via SUBCUTANEOUS
  Administered 2011-05-05 – 2011-05-06 (×4): 3 [IU] via SUBCUTANEOUS
  Administered 2011-05-07 – 2011-05-08 (×2): 2 [IU] via SUBCUTANEOUS

## 2011-05-02 MED ORDER — VANCOMYCIN HCL 1000 MG IV SOLR
1250.0000 mg | INTRAVENOUS | Status: DC
  Administered 2011-05-03: 1250 mg via INTRAVENOUS
  Filled 2011-05-02 (×2): qty 1250

## 2011-05-02 MED ORDER — METOCLOPRAMIDE HCL 5 MG/5ML PO SOLN
10.0000 mg | Freq: Four times a day (QID) | ORAL | Status: DC
  Administered 2011-05-02 – 2011-05-05 (×13): 10 mg
  Filled 2011-05-02 (×17): qty 10

## 2011-05-02 MED ORDER — SODIUM CHLORIDE 0.9 % IV SOLN
1250.0000 mg | INTRAVENOUS | Status: AC
  Filled 2011-05-02: qty 1250

## 2011-05-02 MED ORDER — INSULIN ASPART 100 UNIT/ML ~~LOC~~ SOLN: 0.0000 [IU] | Freq: Three times a day (TID) | SUBCUTANEOUS | Status: DC

## 2011-05-02 MED ORDER — POTASSIUM CHLORIDE 20 MEQ/15ML (10%) PO LIQD
40.0000 meq | Freq: Once | ORAL | Status: AC
  Administered 2011-05-02: 40 meq
  Filled 2011-05-02: qty 30

## 2011-05-02 NOTE — Progress Notes (Signed)
Patient name: [name] Medical record number: [id number] Date of birth: March 03, 1926 Age: 78 y.o. Gender: male PCP: Oliver Barre, MD, MD   PCCM SVC  Reason for Consult: Acute transfusion reaction Referring Physician: Altha Harm, MD  Brief patient profile: 16 yowm with prostate cancer, active ETOH use, CVA, CAD, HTN, PVD, AS post valve replacement. Adm 4/7 with gross hematuria secondary to radiation cystitis. Critical care consult called by Dr. Ashley Royalty on 4/7 for evaluation of high grade fevers, chills , tachypnea during transfusion , recurred 4/8 >> attributed to minor non hemolytic reaction, tolerated transfusion 4/9 with pre-medication. Course complicated by ETOH withdrawal, e.coli UTI  & RVR 4/10 PCCM signed off. 4/11 PCCM reconsulted for agitation.Marland Kitchen  HCAP RLL on CXR, resp failure acute, intubated and tfr to PCCM .  Lines/tubes ETT 4/11>> CVL L IJ 4/11>>  Microbiology/Sepsis markers: 4/7 urine >> e. Coli S to ceftx 4/11 resp c/s > rare wbc, no org seen >>> 4/11 blood x2  >>  Abx ceftx 4/7 >>4/11 4/11 Vanco (HCAP)> 4/14 4/11 zoysn(HCAP)>>  Events: CT HEAD 4/11>>no acute process    Subj: Sedated on wua but tol ps wean/ air trapping    Vital signs for last 24 hours: Temp:  [98.9 F (37.2 C)-102.1 F (38.9 C)] 98.9 F (37.2 C) (04/14 0750) Pulse Rate:  [54-131] 83  (04/14 0700) Resp:  [13-24] 21  (04/14 0700) BP: (109-158)/(40-74) 115/49 mmHg (04/14 0700) SpO2:  [95 %-100 %] 98 % (04/14 0750) FiO2 (%):  [30 %] 30 % (04/14 0750) Weight:  [164 lb 10.9 oz (74.7 kg)] 164 lb 10.9 oz (74.7 kg) (04/14 0000)      . amiodarone (NEXTERONE PREMIX) 360 mg/200 mL dextrose 30 mg/hr (05/02/11 0700)  . dextrose 75 mL (05/01/11 0930)  . feeding supplement (JEVITY 1.2 CAL) 75 mL/hr at 05/02/11 0607  . fentaNYL infusion INTRAVENOUS 50 mcg/hr (05/02/11 0700)  . midazolam (VERSED) infusion 1 mg/hr (05/02/11 0700)     Intake/Output Summary (Last 24 hours) at 05/02/11  0813 Last data filed at 05/02/11 0800  Gross per 24 hour  Intake 5444.1 ml  Output   2850 ml  Net 2594.1 ml    CVP:  [4 mmHg-15 mmHg] 4 mmHg     Vent Mode:  [-] CPAP FiO2 (%):  [30 %] 30 % Set Rate:  [16 bmp] 16 bmp Vt Set:  [480 mL] 480 mL PEEP:  [5 cmH20] 5 cmH20 Pressure Support:  [5 cmH20-8 cmH20] 5 cmH20 Plateau Pressure:  [6 cmH20-13 cmH20] 13 cmH20  Physical Exam:  General  Intubated sdedated Eyes: Anicteric sclerae. ENT: Oropharynx clear. Moist mucous membranes. No thrush Lymph: No cervical, supraclavicular, or axillary lymphadenopathy. Heart: .HSIR Lungs: coarse rhonchi  Abdomen: Abdomen soft, non-tender and not distended, normoactive bowel sounds. No hepatosplenomegaly or masses. POS RESIDUAL X 400 cc Musculoskeletal: No clubbing or synovitis. Skin: No rashes or lesions Neuro: sedated   LAB RESULT  Lab 05/02/11 0408 05/01/11 0415 04/30/11 0445  NA 146* 150* 152*  K 3.1* 3.5 3.0*  CL 114* 121* 120*  CO2 24 22 21   BUN 23 26* 36*  CREATININE 1.20 1.25 1.56*  GLUCOSE 169* 122* 111*    Lab 05/01/11 0415 04/30/11 0445 04/29/11 0130  HGB 7.7* 6.5* 7.5*  HCT 23.2* 19.6* 22.2*  WBC 8.6 4.7 8.5  PLT 167 221 279     Ct Head Wo Contrast  04/29/2011  *RADIOLOGY REPORT*  Clinical Data: Confusion with history of dementia and known prostate cancer.  Cheyne-Stokes respiration.  Previous aortic valve replacement. Previous right carotid endarterectomy.  CT HEAD WITHOUT CONTRAST  Technique:    IMPRESSION: Motion degraded exam.  Atrophy and chronic microvascular ischemic change.  Remote left MCA territory infarct relates to chronic left internal carotid artery occlusion.  No visible  acute stroke or bleed.  No gross evidence for metastatic disease.  Original Report Authenticated By: Elsie Stain, M.D.      04/30/2011  *RADIOLOGY REPORT*  Clinical Data: Evaluate pneumonia.  PORTABLE CHEST - 1 VIEW  Comparison: 04/29/2011  Findings: ET tube approximately 5 cm above carina.   Bibasilar opacities, right greater than left, persist without significant improvement. Lung volumes are reduced.  Small right effusion.  No pneumothorax.  Nasogastric tube tip in stomach. Unremarkable osseous structures.  IMPRESSION: No significant improvement.  Slightly reduced lung volumes.  Original Report Authenticated By: Elsie Stain, M.D.   Portable Chest Xray  04/29/2011  *RADIOLOGY REPORT*  Clinical Data: Intubated.  Central line placement.  PORTABLE CHEST - 1 VIEW  Comparison: 04/26/2011  Findings: Endotracheal tube is 6 cm above the carina.  Left central line tip is in the SVC.  No pneumothorax.  Improving lung volumes. Continued right basilar atelectasis or infiltrate, favor atelectasis.  Improved aeration on the left without current confluent opacity.  No effusions.  Mild cardiomegaly.  IMPRESSION: Support devices in expected position.  Improving aeration in the lung bases.  Mild residual right base atelectasis or infiltrate.  Original Report Authenticated By: Cyndie Chime, M.D.     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 Assessment: New RLL infiltrate, leukocytosis,  E.coli UTI S to ceftx Plan VAnco/zosyn empiric due to clinical worsening 4/11 per dashboard, neg mrsa so dc'd  vanc 4/14   pulm toilet, BD neb meds  . 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 Vol overloaded 4/13 so change maint IV to D5W and diures Lab Results  Component Value Date   NA 146* 05/02/2011   NA 150* 05/01/2011   NA 152* 04/30/2011   improving 4/14 > continue free water and D5@ at 50  Acute renal failure Lab Results  Component Value Date   CREATININE 1.20 05/02/2011   CREATININE 1.25 05/01/2011   CREATININE 1.56* 04/30/2011   CREATININE 1.05 01/29/2011   Euvolvemic to slt dry by cvp 4/14 so continue IV hydration with hypotonic fluids  Severe protein calorie malnutrition Plan TF's on hold due to residual 4/14 > try reglan   PCCM primary svc Full code DVT proph - SCds due to hematuria  PPI for SUP  The patient is critically ill with multiple organ systems failure and requires high complexity decision making for  assessment and support, frequent evaluation and titration of therapies, application of advanced monitoring technologies and extensive interpretation of multiple databases. Critical Care Time devoted to patient care services described in this note is 45 minutes.      Sandrea Hughs, MD Pulmonary and Critical Care Medicine [facility] Cell [phone]

## 2011-05-02 NOTE — Progress Notes (Signed)
eLink Physician-Brief Progress Note Patient Name: [name] DOB: 1926/12/13 MRN: [id number]  Date of Service  05/02/2011   HPI/Events of Note   rn says patient on tube feeds. Glucose 163  eICU Interventions  Sliding scale   Intervention Category Intermediate Interventions: Other:  [name] 05/02/2011, 5:10 PM

## 2011-05-02 NOTE — Progress Notes (Signed)
eLink Physician-Brief Progress Note Patient Name: [name] DOB: Apr 09, 1926 MRN: [id number]  Date of Service  05/02/2011   HPI/Events of Note   hypokalemia  eICU Interventions  Potassium replaced   Intervention Category Minor Interventions: Electrolytes abnormality - evaluation and management  [name] 05/02/2011, 5:13 AM

## 2011-05-02 NOTE — Progress Notes (Signed)
Subjective:  Remains sedated and intubated  Objective:  Vital Signs in the last 24 hours: BP 138/80  Pulse 70  Temp(Src) 98.9 F (37.2 C) (Oral)  Resp 23  Ht 5\' 11"  (1.803 m)  Wt 74.7 kg (164 lb 10.9 oz)  BMI 22.97 kg/m2  SpO2 98%  Physical Exam: Thin BM intubated, not responsive secondary to sedation Lungs: rhonchi bilat Cardiac:  Rapid irregulat rhythm, normal S1 and S2, no S3 2/6 systolic murmr Abdomen:  Soft, nontender, no masses Extremities:  No edema present  Intake/Output from previous day: 04/13 0701 - 04/14 0700 In: 5439.1 [I.V.:2609.1; NG/GT:1880; IV Piggyback:350] Out: 3250 [Urine:3250]  Weight Filed Weights   04/26/11 0100 05/01/11 0500 05/02/11 0000  Weight: 84.8 kg (186 lb 15.2 oz) 72.1 kg (158 lb 15.2 oz) 74.7 kg (164 lb 10.9 oz)    Lab Results: Basic Metabolic Panel:  Basename 05/02/11 0408 05/01/11 0415  NA 146* 150*  K 3.1* 3.5  CL 114* 121*  CO2 24 22  GLUCOSE 169* 122*  BUN 23 26*  CREATININE 1.20 1.25   CBC:  Basename 05/01/11 0415 04/30/11 0445  WBC 8.6 4.7  NEUTROABS -- 3.6  HGB 7.7* 6.5*  HCT 23.2* 19.6*  MCV 90.3 89.1  PLT 167 221   BNP    Component Value Date/Time   PROBNP 11811.0* 04/29/2011 0130   Telemetry: In rapid afib today with occasional runs of VT versus   Assessment/Plan:  1) atrial fibrillation - back in a fib today on amiodarone 2) Mild elevation in troponin - most likely related to atrial fibrillation; LV function normal; no plans for further ischemia eval  3) UTI/aspiration pneumonia - continue antibiotics  4)acute on chronic renal insuff/hypernatremia - improving 5) CAD (s/p CABG)  6)hypokalemia 7) VDRF - CCM managing  Rec:  Keep on IV amiodarone b/o occasion VT.  Correct potassium. Continue amiodarone for now. Darden Palmer  MD [facility]  05/02/2011, 9:33 AM

## 2011-05-02 NOTE — Progress Notes (Signed)
eLink Physician-Brief Progress Note Patient Name: [name] DOB: 1926/05/03 MRN: [id number]  Date of Service  05/02/2011   HPI/Events of Note   RN calling elink  New phlebitis v cellulitis on Rt forearm distal aspect  eICU Interventions  Elevate rt forearm Warm compress Restart vanc Monitor closely   Intervention Category Intermediate Interventions: Other:  Amika Tassin 05/02/2011, 6:33 PM

## 2011-05-02 NOTE — Progress Notes (Signed)
Patient ID: [name], male   DOB: [birth year], 78 y.o.   MRN: [id number]  Patient remains ventilated and intubated. Urologically his situation has improved measures continued to be crystal clear. We would can be reconsulted if he gross hematuria recurs.

## 2011-05-02 NOTE — Progress Notes (Signed)
Nursing note late entry. Red streak noted  rt forearm, warm to touch, warm compress applied.

## 2011-05-02 NOTE — Progress Notes (Signed)
Nutrition Follow-up  Diet Order:  NPO TF: Jevity 1.2 at 75 ml/hr providing 2016 kcal, 93 g protein, 1458 ml free water, 200 ml free water QID.   Residual of 280 ml measured today. Note: residuals up to 400 ml are acceptable.   Patient remains intubated and sedated.   Patient at refeeding risk. K at 3.1 L, Mg WNL, Phos not tested.   Meds: Scheduled Meds:   . folic acid  1 mg Intravenous Daily  . metoCLOPramide  10 mg Per Tube Q6H  . pantoprazole sodium  40 mg Per Tube Q1200  . potassium chloride  40 mEq Per Tube Once  . sodium chloride  3 mL Intravenous Q12H  . thiamine  100 mg Intravenous Daily   Continuous Infusions:   . dextrose 75 mL (05/01/11 0930)  . feeding supplement (JEVITY 1.2 CAL) 75 mL/hr at 05/02/11 0607   Labs:  CMP     Component Value Date/Time   NA 146* 05/02/2011 0408   K 3.1* 05/02/2011 0408   CL 114* 05/02/2011 0408   CO2 24 05/02/2011 0408   GLUCOSE 169* 05/02/2011 0408   BUN 23 05/02/2011 0408   CREATININE 1.20 05/02/2011 0408   CREATININE 1.05 01/29/2011 1708   CALCIUM 8.0* 05/02/2011 0408   PROT 6.9 04/28/2011 0335   ALBUMIN 2.5* 04/28/2011 0335   AST 27 04/28/2011 0335   ALT 13 04/28/2011 0335   ALKPHOS 95 04/28/2011 0335   BILITOT 0.5 04/28/2011 0335   GFRNONAA 54* 05/02/2011 0408   GFRAA 63* 05/02/2011 0408   MG 2.2   Intake/Output Summary (Last 24 hours) at 05/02/11 1116 Last data filed at 05/02/11 0900  Gross per 24 hour  Intake 4834.7 ml  Output   2350 ml  Net 2484.7 ml   Weight Status:  74.7 kg  Re-estimated needs:  No changes 2082-2290 kcal 100-115 g protein 2 L fluid  Nutrition Dx:  Inadequate oral intake, ongoing  Goal:  Patient will meet 100% estimated needs, meeting  Intervention:  Continue Jevity 1.2 at 75 ml/hr. Hold TF if residuals >400 ml  Monitor:  TF tolerance, refeeding risk, weight, labs   Kaylee Trivett, [employer]

## 2011-05-02 NOTE — Progress Notes (Addendum)
ANTIBIOTIC CONSULT NOTE - INITIAL  Pharmacy Consult for Zosyn Indication: pneumonia  Allergies  Allergen Reactions  . Tolterodine Tartrate     Patient Measurements: Height: 5\' 11"  (180.3 cm) Weight: 164 lb 10.9 oz (74.7 kg) IBW/kg (Calculated) : 75.3    Vital Signs: Temp: 98.9 F (37.2 C) (04/14 0750) Temp src: Oral (04/14 0750) BP: 138/80 mmHg (04/14 0820) Pulse Rate: 70  (04/14 0820) Intake/Output from previous day: 04/13 0701 - 04/14 0700 In: 5439.1 [I.V.:2609.1; NG/GT:1880; IV Piggyback:350] Out: 3250 [Urine:3250] Intake/Output from this shift: Total I/O In: 687.3 [I.V.:374.8; NG/GT:300; IV Piggyback:12.5] Out: 100 [Urine:100]  Labs:  Basename 05/02/11 0408 05/01/11 0415 04/30/11 0445  WBC -- 8.6 4.7  HGB -- 7.7* 6.5*  PLT -- 167 221  LABCREA -- -- --  CREATININE 1.20 1.25 1.56*   Estimated Creatinine Clearance: 49.3 ml/min (by C-G formula based on Cr of 1.2).   Microbiology: 4/7 mrsa pcr: negative 4/7 urine: >100k E.coli (sensitive to all agents tested except ampicillin)  Medical History: Past Medical History  Diagnosis Date  . ADENOCARCINOMA, PROSTATE   . ALLERGIC RHINITIS   . B12 DEFICIENCY   . BENIGN PROSTATIC HYPERTROPHY   . CAROTID ARTERY DISEASE   . CEREBROVASCULAR ACCIDENT, HX OF   . CORONARY ARTERY DISEASE   . DIVERTICULOSIS, COLON   . GERD   . HEMORRHOIDS   . HIATAL HERNIA   . HYPERLIPIDEMIA   . HYPERTENSION   . OSTEOPENIA   . PEPTIC ULCER DISEASE   . PERIPHERAL VASCULAR DISEASE   . Personal history of alcoholism   . PROSTATE CANCER, HX OF   . PUD, HX OF   . TRANSIENT ISCHEMIC ATTACK   . Shingles   . Anemia   . AS (aortic stenosis)   . S/P AVR (aortic valve replacement) 09/23/2010  . Complication of anesthesia 09/23/2010    very confused after waking up. Stopped drinking 40 days before this surgery.  . Transfusion reaction, chill fever type 04/28/2011    Fever, rigors, tachycardia,tachypnea but no evidence of hemolysis  .  Hemorrhagic cystitis 04/28/2011    Due to previous radiation for PSA recurrence of prostate cancer    Medications:  Scheduled:     . acetaminophen  650 mg Oral Once  . antiseptic oral rinse  15 mL Mouth Rinse BID  . chlorhexidine  15 mL Mouth/Throat QID  . clotrimazole   Topical BID  . folic acid  1 mg Intravenous Daily  . free water  200 mL Per Tube Q6H  . ipratropium  0.5 mg Nebulization Q6H  . levalbuterol  0.63 mg Nebulization Q6H  . metoCLOPramide  10 mg Per Tube Q6H  . pantoprazole sodium  40 mg Per Tube Q1200  . piperacillin-tazobactam (ZOSYN)  IV  3.375 g Intravenous Q8H  . potassium chloride  40 mEq Per Tube Once  . sodium chloride  3 mL Intravenous Q12H  . thiamine  100 mg Intravenous Daily  . DISCONTD: albuterol  2.5 mg Nebulization Q6H  . DISCONTD: ipratropium  0.5 mg Nebulization Q4H  . DISCONTD: vancomycin  1,250 mg Intravenous Q24H   Infusions:     . amiodarone (NEXTERONE PREMIX) 360 mg/200 mL dextrose 30 mg/hr (05/02/11 0700)  . dextrose 75 mL (05/01/11 0930)  . feeding supplement (JEVITY 1.2 CAL) 75 mL/hr at 05/02/11 0607  . fentaNYL infusion INTRAVENOUS 50 mcg/hr (05/02/11 0700)  . midazolam (VERSED) infusion 1 mg/hr (05/02/11 0700)   Assessment:  25 YOM with prostate cancer presents with gross  hematuria due to radiation cystitis.  Received 2 doses Rocephin for E.coli UTI, then expanded coverage to Vanc/Zosyn for HCAP with aspiration and UTI.  Today is D#4 Zosyn.  Blood and tracheal aspirate cultures: no growth to date  MRSA negative - Vancomycin d/c today  SCr is improving, CrCl today ~50 ml/min  Goal of Therapy:  Eradication of infection  Plan:  Continue Zosyn 3.375g IV Q8H infused over 4hrs. Follow renal function & cultures   Lynann Beaver PharmD, BCPS Pager [phone] 05/02/2011 11:35 AM      Addendum:   4/14 PM  Mr. Cauthorn was on Vancomycin/Zosyn for HCAP and UTI. Vancomycin was discontinued earlier today given negative MRSA PCR.   Dr. Marchelle Gearing re-ordered Vanc per pharmacy for cellulitis on R forearm.    Scr improved, will resume vancomycin at 1250 mg IV q24h.  New vancomycin trough of 10-15 for cellulitis.    Geoffry Paradise, PharmD.   Pager:  [phone] 6:45 PM

## 2011-05-03 ENCOUNTER — Inpatient Hospital Stay (HOSPITAL_COMMUNITY): Payer: Medicare Other

## 2011-05-03 DIAGNOSIS — D62 Acute posthemorrhagic anemia: Secondary | ICD-10-CM

## 2011-05-03 LAB — GLUCOSE, CAPILLARY
Glucose-Capillary: 152 mg/dL — ABNORMAL HIGH (ref 70–99)
Glucose-Capillary: 156 mg/dL — ABNORMAL HIGH (ref 70–99)

## 2011-05-03 LAB — BASIC METABOLIC PANEL
GFR calc Af Amer: 66 mL/min — ABNORMAL LOW (ref >90)
GFR calc non Af Amer: 57 mL/min — ABNORMAL LOW (ref >90)
Potassium: 3.2 mEq/L — ABNORMAL LOW (ref 3.5–5.1)
Sodium: 140 mEq/L (ref 135–145)

## 2011-05-03 LAB — CBC
Hemoglobin: 7.7 g/dL — ABNORMAL LOW (ref 13.0–17.0)
Platelets: 153 10*3/uL (ref 150–400)
RBC: 2.67 MIL/uL — ABNORMAL LOW (ref 4.22–5.81)
WBC: 11.2 10*3/uL — ABNORMAL HIGH (ref 4.0–10.5)

## 2011-05-03 MED ORDER — AMIODARONE HCL 200 MG PO TABS
200.0000 mg | ORAL_TABLET | Freq: Two times a day (BID) | ORAL | Status: DC
  Administered 2011-05-03 – 2011-05-11 (×14): 200 mg via ORAL
  Filled 2011-05-03 (×19): qty 1

## 2011-05-03 MED ORDER — MIDAZOLAM HCL 5 MG/ML IJ SOLN: 1.0000 mg | INTRAMUSCULAR | Status: DC | PRN

## 2011-05-03 MED ORDER — MIDAZOLAM HCL 5 MG/ML IJ SOLN
INTRAMUSCULAR | Status: AC
  Administered 2011-05-03: 2 mg
  Filled 2011-05-03: qty 1

## 2011-05-03 MED ORDER — POTASSIUM CHLORIDE 20 MEQ/15ML (10%) PO LIQD
40.0000 meq | Freq: Three times a day (TID) | ORAL | Status: AC
  Administered 2011-05-03 (×3): 40 meq
  Filled 2011-05-03 (×3): qty 30

## 2011-05-03 MED ORDER — FREE WATER
300.0000 mL | Freq: Four times a day (QID) | Status: DC
  Administered 2011-05-03 – 2011-05-06 (×12): 300 mL

## 2011-05-03 MED ORDER — JEVITY 1.2 CAL PO LIQD
1000.0000 mL | ORAL | Status: DC
  Administered 2011-05-04 – 2011-05-05 (×2): 1000 mL

## 2011-05-03 MED ORDER — FUROSEMIDE 10 MG/ML IJ SOLN
40.0000 mg | Freq: Four times a day (QID) | INTRAMUSCULAR | Status: AC
  Administered 2011-05-03 (×3): 40 mg via INTRAVENOUS
  Filled 2011-05-03 (×3): qty 4

## 2011-05-03 MED ORDER — FENTANYL CITRATE 0.05 MG/ML IJ SOLN
25.0000 ug | INTRAMUSCULAR | Status: DC | PRN
  Administered 2011-05-03 – 2011-05-04 (×7): 50 ug via INTRAVENOUS
  Filled 2011-05-03 (×7): qty 2

## 2011-05-03 MED ORDER — POTASSIUM CHLORIDE 20 MEQ/15ML (10%) PO LIQD
60.0000 meq | Freq: Once | ORAL | Status: AC
  Administered 2011-05-03: 60 meq
  Filled 2011-05-03: qty 45

## 2011-05-03 NOTE — Procedures (Signed)
Central Venous Catheter Insertion Procedure Note [name] [id number] 1926/03/16  Procedure: Insertion of Central Venous Catheter Indications: Assessment of intravascular volume  Procedure Details Consent: Risks of procedure as well as the alternatives and risks of each were explained to the (patient/caregiver).  Consent for procedure obtained. Time Out: Verified patient identification, verified procedure, site/side was marked, verified correct patient position, special equipment/implants available, medications/allergies/relevent history reviewed, required imaging and test results available.  Performed  Maximum sterile technique was used including antiseptics, cap, gloves, gown, hand hygiene, mask and sheet. Skin prep: Chlorhexidine; local anesthetic administered A antimicrobial bonded/coated triple lumen catheter was placed in the right internal jugular vein using the Seldinger technique. Ultrasound guidance used.yes Catheter placed to 17 cm. Blood aspirated via all 3 ports and then flushed x 3. Line sutured x 2 and dressing applied.  Evaluation Blood flow good Complications: No apparent complications Patient did tolerate procedure well. Chest X-ray ordered to verify placement.  CXR: pending.  Brett Canales Minor ACNP [facility]ack PCCM Pager [phone] till 3 pm If no answer page [phone] 05/03/2011, 11:24 AM   U/S used in placement, picture in chart.  I was present for the entire procedure.  Patient seen and examined, agree with above note.  I dictated the care and orders written for this patient under my direction.  Koren Bound, M.D. [phone]

## 2011-05-03 NOTE — Progress Notes (Signed)
CARE MANAGEMENT NOTE 05/03/2011  Patient:  [name], [name]   Account Number:  [id number]  Date Initiated:  04/28/2011  Documentation initiated by:  Raiford Noble  Subjective/Objective Assessment:   pt adm with hematuria, anemia; hx of etoh, prostate cancer     Action/Plan:   from home w/ wife-- may need snf at dc- will await pt recs   Anticipated DC Date:  05/06/2011   Anticipated DC Plan:  SKILLED NURSING FACILITY  In-house referral  Clinical Social Worker      DC Planning Services  CM consult      [facility] Choice  NA   Choice offered to / List presented to:  NA   DME arranged  NA      DME agency  NA     HH arranged  NA      HH agency  NA   Status of service:  In process, will continue to follow Medicare Important Message given?  NA - LOS <3 / Initial given by admissions (If response is "NO", the following Medicare IM given date fields will be blank) Date Medicare IM given:   Date Additional Medicare IM given:    Discharge Disposition:    Per UR Regulation:  Reviewed for med. necessity/level of care/duration of stay  If discussed at Long Length of Stay Meetings, dates discussed:    Comments:  [id number]/Zorianna Taliaferro,RN,BSN,CCM [phone] Patient remains on the vent is calmer due to sedation. 04-29-11 Raiford Noble, RN,BSN,CM [phone] Pt remains in restraints, agitated, confused. Now intubated on vent. Will follow. Likely will need snf at dc if stable.   04-28-11 Raiford Noble, RN,BSN,CM [phone] Not stable for dc at this time. May need SNF at dc. Will cont to follow.

## 2011-05-03 NOTE — Progress Notes (Signed)
PROGRESS NOTE  Subjective:    [name] is a 78 y.o. male with a history of OHS/AoVR with CABG September 2012. 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 He had anemia requiring transfusions, then had a transfusion reaction which was treated. He had confusion and agitation, requiring sedation, and was also going through ETOH withdrawal.  He was seen by hematology as well. He was noted to have worsening pulmonary edema on CXR and cardiology was asked to evaluate him. The patient has received sedation. His wife is present. He is not able to answer questions and per the nursing staff, is very agitated unless sedated. His wife states he has not complained of chest pain recently but did get her to feel his heart because he stated it felt different, but did not say why.  He is still intubated for resp. Failure and pneumonia. Remains in AF- has been on IV amiodarone for 4 days. Not responsive.  Objective:    Vital Signs:   Temp:  [98.8 F (37.1 C)-100.7 F (38.2 C)] 100 F (37.8 C) (04/15 0400) Pulse Rate:  [55-129] 85  (04/15 0500) Resp:  [21-32] 24  (04/15 0500) BP: (90-152)/(38-80) 126/50 mmHg (04/15 0500) SpO2:  [96 %-100 %] 98 % (04/15 0500) FiO2 (%):  [30 %] 30 % (04/15 0744) Weight:  [171 lb 4.8 oz (77.7 kg)] 171 lb 4.8 oz (77.7 kg) (04/15 0500)  Last BM Date: 05/02/11   24-hour weight change: Weight change: 6 lb 9.8 oz (3 kg)  Weight trends: Filed Weights   05/01/11 0500 05/02/11 0000 05/03/11 0500  Weight: 158 lb 15.2 oz (72.1 kg) 164 lb 10.9 oz (74.7 kg) 171 lb 4.8 oz (77.7 kg)    Intake/Output:  04/14 0701 - 04/15 0700 In: 4406.1 [I.V.:1821.1; NG/GT:2435; IV Piggyback:150] Out: 1723 [Urine:1178; Emesis/NG output:545]     Physical Exam: BP 126/50  Pulse 85  Temp(Src) 100 F (37.8 C) (Oral)  Resp 24  Ht 5\' 11"  (1.803 m)  Wt 171 lb 4.8 oz (77.7 kg)  BMI 23.89 kg/m2  SpO2 98%    General: Vital signs reviewed and noted. Intubated, sedated, on vent.  Head: Normocephalic, atraumatic.  Eyes: conjunctivae/corneas clear. PERRL, EOM's intact. Fundi benign.  Throat: Oropharynx nonerythematous, no exudate appreciated.   Neck: Supple. Normal carotids. No JVD  Lungs:  rhonchi  Heart: Irregularly irregular. 2/6 systolic murmur  Abdomen:  Soft, non-tender, non-distended with normoactive bowel sounds. No hepatomegaly. No rebound/guarding. No abdominal masses.  Extremities: No edema.  Distal pedal pulses are 2+ and equal bilaterally.  Neurologic: sedated  Psych:     Labs: BMET:  Basename 05/03/11 0330 05/02/11 0408  NA 140 146*  K 3.2* 3.1*  CL 107 114*  CO2 25 24  GLUCOSE 169* 169*  BUN 23 23  CREATININE 1.15 1.20  CALCIUM 7.9* 8.0*  MG -- --  PHOS -- --    Liver function tests: No results found for this basename: AST:2,ALT:2,ALKPHOS:2,BILITOT:2,PROT:2,ALBUMIN:2 in the last 72 hours No results found for this basename: LIPASE:2,AMYLASE:2 in the last 72 hours  CBC:  Basename 05/03/11 0330 05/01/11 0415  WBC 11.2* 8.6  NEUTROABS -- --  HGB 7.7* 7.7*  HCT 24.2* 23.2*  MCV 90.6 90.3  PLT 153 167    Cardiac Enzymes: No results found for this basename: CKTOTAL:4,CKMB:4,TROPONINI:4 in the last 72 hours  Coagulation Studies: No results found for this basename: LABPROT:5,INR:5 in the last 72 hours  Other:  Tele:  A-Fib at 90   Medications:    Infusions:    . amiodarone (NEXTERONE PREMIX) 360 mg/200 mL dextrose 30 mg/hr (05/03/11 0600)  . dextrose 75 mL/hr at 05/03/11 0615  . feeding supplement (JEVITY 1.2 CAL) 1,000 mL (05/02/11 1849)  . fentaNYL infusion INTRAVENOUS 25 mcg/hr (05/03/11 0630)  . midazolam (VERSED) infusion 1 mg/hr (05/03/11 0600)    Scheduled Medications:    . acetaminophen  650 mg Oral Once  . antiseptic oral rinse  15 mL Mouth Rinse BID  . chlorhexidine  15 mL Mouth/Throat QID  . clotrimazole   Topical BID  . folic acid   1 mg Intravenous Daily  . free water  200 mL Per Tube Q6H  . insulin aspart  0-15 Units Subcutaneous Q4H  . ipratropium  0.5 mg Nebulization Q6H  . levalbuterol  0.63 mg Nebulization Q6H  . metoCLOPramide  10 mg Per Tube Q6H  . pantoprazole sodium  40 mg Per Tube Q1200  . piperacillin-tazobactam (ZOSYN)  IV  3.375 g Intravenous Q8H  . potassium chloride  60 mEq Per Tube Once  . sodium chloride  3 mL Intravenous Q12H  . thiamine  100 mg Intravenous Daily  . vancomycin  1,250 mg Intravenous Q24H  . vancomycin  1,250 mg Intravenous NOW  . DISCONTD: albuterol  2.5 mg Nebulization Q6H  . DISCONTD: insulin aspart  0-15 Units Subcutaneous TID WC  . DISCONTD: ipratropium  0.5 mg Nebulization Q4H  . DISCONTD: vancomycin  1,250 mg Intravenous Q24H    Assessment/ Plan:    1. A-Fib: will change to PO (NG) amiodarone.  No other cardiac recs   Disposition:  Length of Stay: 8  Vesta Mixer, Montez Hageman., MD, [facility] 05/03/2011, 8:02 AM

## 2011-05-03 NOTE — Progress Notes (Signed)
Subjective: Patient still intubated, 2ndary respiratory failure. C/s negative.  Weaning slowly. #2. Hemorrhagic cystitis: urine clear. Off Carboprost.  Objective: Vital signs in last 24 hours: Temp:  [98.8 F (37.1 C)-100.7 F (38.2 C)] 99.7 F (37.6 C) (04/15 0800) Pulse Rate:  [55-129] 92  (04/15 0800) Resp:  [21-32] 27  (04/15 0800) BP: (90-152)/(38-75) 140/69 mmHg (04/15 0800) SpO2:  [96 %-100 %] 100 % (04/15 0800) FiO2 (%):  [30 %] 30 % (04/15 0744) Weight:  [77.7 kg (171 lb 4.8 oz)] 77.7 kg (171 lb 4.8 oz) (04/15 0500)A  Intake/Output from previous day: 04/14 0701 - 04/15 0700 In: 4406.1 [I.V.:1821.1; NG/GT:2435; IV Piggyback:150] Out: 1723 [Urine:1178; Emesis/NG output:545] Intake/Output this shift: Total I/O In: -  Out: 95 [Urine:95]  Past Medical History  Diagnosis Date  . ADENOCARCINOMA, PROSTATE   . ALLERGIC RHINITIS   . B12 DEFICIENCY   . BENIGN PROSTATIC HYPERTROPHY   . CAROTID ARTERY DISEASE   . CEREBROVASCULAR ACCIDENT, HX OF   . CORONARY ARTERY DISEASE   . DIVERTICULOSIS, COLON   . GERD   . HEMORRHOIDS   . HIATAL HERNIA   . HYPERLIPIDEMIA   . HYPERTENSION   . OSTEOPENIA   . PEPTIC ULCER DISEASE   . PERIPHERAL VASCULAR DISEASE   . Personal history of alcoholism   . PROSTATE CANCER, HX OF   . PUD, HX OF   . TRANSIENT ISCHEMIC ATTACK   . Shingles   . Anemia   . AS (aortic stenosis)   . S/P AVR (aortic valve replacement) 09/23/2010  . Complication of anesthesia 09/23/2010    very confused after waking up. Stopped drinking 40 days before this surgery.  . Transfusion reaction, chill fever type 04/28/2011    Fever, rigors, tachycardia,tachypnea but no evidence of hemolysis  . Hemorrhagic cystitis 04/28/2011    Due to previous radiation for PSA recurrence of prostate cancer    Physical Exam:  General:thin w male intubated, sleeping. Foley uine clear. Lungs - Normal respiratory effort, chest expands symmetrically.  Abdomen - Soft, non-tender &  non-distended.  Lab Results:  Basename 05/03/11 0330 05/01/11 0415  WBC 11.2* 8.6  HGB 7.7* 7.7*  HCT 24.2* 23.2*   BMET  Basename 05/03/11 0330 05/02/11 0408  NA 140 146*  K 3.2* 3.1*  CL 107 114*  CO2 25 24  GLUCOSE 169* 169*  BUN 23 23  CREATININE 1.15 1.20  CALCIUM 7.9* 8.0*   No results found for this basename: LABURIN:1 in the last 72 hours Results for orders placed during the hospital encounter of 04/25/11  URINE CULTURE     Status: Normal   Collection Time   04/25/11  4:24 AM      Component Value Range Status Comment   Specimen Description URINE, CATHETERIZED   Final    Special Requests NONE   Final    Culture  Setup Time [id number]   Final    Colony Count >=100,000 COLONIES/ML   Final    Culture ESCHERICHIA COLI   Final    Report Status 04/27/2011 FINAL   Final    Organism ID, Bacteria ESCHERICHIA COLI   Final   MRSA PCR SCREENING     Status: Normal   Collection Time   04/25/11  6:54 PM      Component Value Range Status Comment   MRSA by PCR NEGATIVE  NEGATIVE  Final   CULTURE, BLOOD (ROUTINE X 2)     Status: Normal (Preliminary result)   Collection Time  04/29/11 12:45 PM      Component Value Range Status Comment   Specimen Description BLOOD CL   Final    Special Requests BOTTLES DRAWN AEROBIC AND ANAEROBIC 10CC   Final    Culture  Setup Time [id number]   Final    Culture     Final    Value:        BLOOD CULTURE RECEIVED NO GROWTH TO DATE CULTURE WILL BE HELD FOR 5 DAYS BEFORE ISSUING A FINAL NEGATIVE REPORT   Report Status PENDING   Incomplete   CULTURE, BLOOD (ROUTINE X 2)     Status: Normal (Preliminary result)   Collection Time   04/29/11  4:15 PM      Component Value Range Status Comment   Specimen Description BLOOD RIGHT ARM   Final    Special Requests BOTTLES DRAWN AEROBIC AND ANAEROBIC 4 CC EACH   Final    Culture  Setup Time [id number]   Final    Culture     Final    Value:        BLOOD CULTURE RECEIVED NO GROWTH TO DATE CULTURE WILL BE  HELD FOR 5 DAYS BEFORE ISSUING A FINAL NEGATIVE REPORT   Report Status PENDING   Incomplete   CULTURE, RESPIRATORY     Status: Normal   Collection Time   04/29/11  4:29 PM      Component Value Range Status Comment   Specimen Description TRACHEAL ASPIRATE   Final    Special Requests Immunocompromised   Final    Gram Stain     Final    Value: RARE WBC PRESENT,BOTH PMN AND MONONUCLEAR     RARE SQUAMOUS EPITHELIAL CELLS PRESENT     NO ORGANISMS SEEN   Culture Non-Pathogenic Oropharyngeal-type Flora Isolated.   Final    Report Status 05/02/2011 FINAL   Final     Studies/Results: @RISRSLT24 @  Assessment/Plan: Ulologically stable today. No new hematuria.  Discussed eventual  possible SNF with pts wife. He has refused in the past.  Rx per [facility]. Can D/c foley when pt is extubated, metabolically stable, and able to void on his own. Iszabella Hebenstreit I 05/03/2011, 9:22 AM

## 2011-05-03 NOTE — Progress Notes (Signed)
HPI:  52 yowm with prostate cancer, active ETOH use, CVA, CAD, HTN, PVD, AS post valve replacement. Adm 4/7 with gross hematuria secondary to radiation cystitis. Critical care consult called by Dr. Ashley Royalty on 4/7 for evaluation of high grade fevers, chills , tachypnea during transfusion , recurred 4/8 >> attributed to minor non hemolytic reaction, tolerated transfusion 4/9 with pre-medication.  Course complicated by ETOH withdrawal, e.coli UTI & RVR  4/10 PCCM signed off.  4/11 PCCM reconsulted for agitation.Marland Kitchen HCAP RLL on CXR, resp failure acute, intubated and tfr to PCCM .  Antibiotics:   ceftx 4/7 >>4/11  4/11 Vanco (HCAP)> 4/14>>>4/15>>> 4/11 zoysn(HCAP)>>  Cultures/Sepsis Markers:   4/7 urine >> e. Coli S to ceftx  4/11 resp c/s > rare wbc, no org seen >>>  4/11 blood x2 >>Neg  Access/Protocols:  ETT 4/11>>> CVL L IJ 4/11>>>  Best Practice: DVT: SCD's GI: Protonix  Subjective: No events overnight.  Physical Exam: Filed Vitals:   05/03/11 0800  BP: 140/69  Pulse: 92  Temp: 99.7 F (37.6 C)  Resp: 27    Intake/Output Summary (Last 24 hours) at 05/03/11 0924 Last data filed at 05/03/11 0800  Gross per 24 hour  Intake 3866.7 ml  Output   1718 ml  Net 2148.7 ml   Vent Mode:  [-] PRVC FiO2 (%):  [30 %] 30 % Set Rate:  [16 bmp] 16 bmp Vt Set:  [480 mL] 480 mL PEEP:  [5 cmH20] 5 cmH20 Pressure Support:  [8 cmH20] 8 cmH20 Plateau Pressure:  [10 cmH20-27 cmH20] 10 cmH20  Neuro: Sedated and intubated. Cardiac: RRR, Nl S1/S2, -M/R/G. Pulmonary: Coarse BS diffusely. GI: Soft, NT, ND and +BS. Extremities: 2+ edema and -tenderness with right forearm erythema.  Labs: CBC    Component Value Date/Time   WBC 11.2* 05/03/2011 0330   RBC 2.67* 05/03/2011 0330   HGB 7.7* 05/03/2011 0330   HCT 24.2* 05/03/2011 0330   PLT 153 05/03/2011 0330   MCV 90.6 05/03/2011 0330   MCH 28.8 05/03/2011 0330   MCHC 31.8 05/03/2011 0330   RDW 15.4 05/03/2011 0330   LYMPHSABS 0.6* 04/30/2011  0445   MONOABS 0.5 04/30/2011 0445   EOSABS 0.0 04/30/2011 0445   BASOSABS 0.0 04/30/2011 0445    BMET    Component Value Date/Time   NA 140 05/03/2011 0330   K 3.2* 05/03/2011 0330   CL 107 05/03/2011 0330   CO2 25 05/03/2011 0330   GLUCOSE 169* 05/03/2011 0330   BUN 23 05/03/2011 0330   CREATININE 1.15 05/03/2011 0330   CREATININE 1.05 01/29/2011 1708   CALCIUM 7.9* 05/03/2011 0330   GFRNONAA 57* 05/03/2011 0330   GFRAA 66* 05/03/2011 0330   ABG    Component Value Date/Time   PHART 7.470* 04/29/2011 1316   PCO2ART 28.1* 04/29/2011 1316   PO2ART 370.0* 04/29/2011 1316   HCO3 20.2 04/29/2011 1316   TCO2 19.4 04/29/2011 1316   ACIDBASEDEF 2.7* 04/29/2011 1316   O2SAT 100.0 04/29/2011 1316    Lab 04/30/11 0445  MG 2.2   Lab Results  Component Value Date   CALCIUM 7.9* 05/03/2011    Chest Xray: pulmonary edema, bibasilar infiltrate.  Assessment & Plan: ETOH abuse/ withdrawal with hematuria, presumed radiation cystitis with febrile transfusion reactions, intubated 4/11 for HCAP/poor mental status now with cellulitis of the right forearm.  Neuro: Toxic/metabolic encephalopathy, no evidence of CVA on CT, on fentanyl and PRN benzos for withdrawal. Plan: EEG.  Neuro consult if not awake after adequate  treatment of infection.  Daily WUA  Minimize sedation, change fentanyl/versed to PRN  Cardiac: Recent surgery, 2D echo from 4/10 with EF 65%, grade 1 diastolic dysfunction and PAP of 53. Plan: Diurese   KVO IVF  Continue NGT flushes  Pulmonary: VDRF due to PNA and acute on chronic diastolic CHF.  In respiratory alkalosis. Plan: Decrease vent rate once on full support  Maintain on PS as tolerated with full vent support at night  Daily SBTs  GI: tolerating TF well with normal BS. Plan: Continue TF.  Renal: UOP low with evidence of fluid overload on CXR. Plan: Replace K aggressively.  IV Lasix 40 q6 x3 doses.  Recheck BMP  KVO IVF  ID: PNA and right forearm  cellulitis Plan: Continue vanc/zosyn day 5/x.  Cultures are all negative  Endocrine: ISS.  The patient is critically ill with multiple organ systems failure and requires high complexity decision making for assessment and support, frequent evaluation and titration of therapies, application of advanced monitoring technologies and extensive interpretation of multiple databases. Critical Care Time devoted to patient care services described in this note is 45 minutes.  Koren Bound, MD [phone]

## 2011-05-03 NOTE — Progress Notes (Signed)
Clinical Social Work Department BRIEF PSYCHOSOCIAL ASSESSMENT 05/03/2011  Patient:  TAMERON, LAMA     Account Number:  [id number]     Admit date:  04/25/2011  Clinical Social Worker:  Vennie Homans, Theresia Majors  Date/Time:  05/03/2011 12:00 N  Referred by:  CSW  Date Referred:  04/30/2011 Referred for  Other - See comment  SNF Placement   Other Referral:   Interview type:  Family Other interview type:   CHART REVIEW, DISCUSSION WITH RN    PSYCHOSOCIAL DATA Living Status:  WIFE Admitted from facility:   Level of care:   Primary support name:  Dresden Lozito Primary support relationship to patient:  SPOUSE Degree of support available:   adequate    CURRENT CONCERNS Current Concerns  Adjustment to Illness  Post-Acute Placement   Other Concerns:    SOCIAL WORK ASSESSMENT / PLAN CSW attempted to meet with wife last week when Pt placed on vent. CSW had left message and then was missing connecting wife. Wife open to meeting CSW, concerned for Pt but hopeful. Wife aware that Pt will most likely SNF after this hospitalization. She reports he refused in the past, but she feels he MUST go now. Wife interested in [facility] and CSW will assist with placement.   Assessment/plan status:  Psychosocial Support/Ongoing Assessment of Needs Other assessment/ plan:   Assist with SNF search this week.   Information/referral to community resources:    PATIENT'S/FAMILY'S RESPONSE TO PLAN OF CARE: Wife appreciated CSW support and open to SNF. CSW to follow for support and placement.

## 2011-05-03 NOTE — Progress Notes (Signed)
eLink Physician-Brief Progress Note Patient Name: ADEKUNLE ROHRBACH DOB: 1926-11-01 MRN: [id number]  Date of Service  05/03/2011   HPI/Events of Note  Hypokalemia   eICU Interventions  Potassium replaced   Intervention Category Minor Interventions: Electrolytes abnormality - evaluation and management  Bryleigh Ottaway 05/03/2011, 4:54 AM

## 2011-05-04 ENCOUNTER — Inpatient Hospital Stay (HOSPITAL_COMMUNITY): Payer: Medicare Other

## 2011-05-04 ENCOUNTER — Other Ambulatory Visit (HOSPITAL_COMMUNITY): Payer: Medicare Other

## 2011-05-04 DIAGNOSIS — I359 Nonrheumatic aortic valve disorder, unspecified: Secondary | ICD-10-CM

## 2011-05-04 LAB — CBC
HCT: 25.5 % — ABNORMAL LOW (ref 39.0–52.0)
Hemoglobin: 8.3 g/dL — ABNORMAL LOW (ref 13.0–17.0)
MCH: 29.5 pg (ref 26.0–34.0)
MCV: 90.7 fL (ref 78.0–100.0)
RBC: 2.81 MIL/uL — ABNORMAL LOW (ref 4.22–5.81)

## 2011-05-04 LAB — BLOOD GAS, ARTERIAL
Acid-Base Excess: 4.6 mmol/L — ABNORMAL HIGH (ref 0.0–2.0)
MECHVT: 480 mL
Patient temperature: 99.3
TCO2: 25.4 mmol/L (ref 0–100)
pH, Arterial: 7.51 — ABNORMAL HIGH (ref 7.350–7.450)

## 2011-05-04 LAB — GLUCOSE, CAPILLARY
Glucose-Capillary: 124 mg/dL — ABNORMAL HIGH (ref 70–99)
Glucose-Capillary: 111 mg/dL — ABNORMAL HIGH (ref 70–99)
Glucose-Capillary: 155 mg/dL — ABNORMAL HIGH (ref 70–99)
Glucose-Capillary: 165 mg/dL — ABNORMAL HIGH (ref 70–99)

## 2011-05-04 LAB — TYPE AND SCREEN
ABO/RH(D): O NEG
Antibody Screen: NEGATIVE
Unit division: 0
Unit division: 0

## 2011-05-04 LAB — BASIC METABOLIC PANEL
CO2: 28 mEq/L (ref 19–32)
Calcium: 8.4 mg/dL (ref 8.4–10.5)
Glucose, Bld: 154 mg/dL — ABNORMAL HIGH (ref 70–99)
Sodium: 142 mEq/L (ref 135–145)

## 2011-05-04 LAB — PHOSPHORUS: Phosphorus: 3.8 mg/dL (ref 2.3–4.6)

## 2011-05-04 MED ORDER — MIDAZOLAM HCL 5 MG/ML IJ SOLN
INTRAMUSCULAR | Status: AC
  Administered 2011-05-04: 2 mg
  Filled 2011-05-04: qty 1

## 2011-05-04 MED ORDER — METOPROLOL TARTRATE 25 MG/10 ML ORAL SUSPENSION
25.0000 mg | Freq: Two times a day (BID) | ORAL | Status: DC
  Administered 2011-05-04 (×2): 25 mg
  Filled 2011-05-04 (×6): qty 10

## 2011-05-04 MED ORDER — SODIUM CHLORIDE 0.9 % IV SOLN
0.2000 ug/kg/h | INTRAVENOUS | Status: DC
  Administered 2011-05-04: 0.4 ug/kg/h via INTRAVENOUS
  Administered 2011-05-05 (×2): 1.2 ug/kg/h via INTRAVENOUS
  Filled 2011-05-04 (×3): qty 2

## 2011-05-04 MED ORDER — FENTANYL CITRATE 0.05 MG/ML IJ SOLN
25.0000 ug | INTRAMUSCULAR | Status: DC | PRN
  Administered 2011-05-04 – 2011-05-06 (×5): 50 ug via INTRAVENOUS
  Filled 2011-05-04 (×5): qty 2

## 2011-05-04 NOTE — Progress Notes (Signed)
HPI:  59 yowm with prostate cancer, active ETOH use, CVA, CAD, HTN, PVD, AS post valve replacement. Adm 4/7 with gross hematuria secondary to radiation cystitis. Critical care consult called by Dr. Ashley Royalty on 4/7 for evaluation of high grade fevers, chills , tachypnea during transfusion , recurred 4/8 >> attributed to minor non hemolytic reaction, tolerated transfusion 4/9 with pre-medication.  Course complicated by ETOH withdrawal, e.coli UTI & RVR  4/10 PCCM signed off.  4/11 PCCM reconsulted for agitation.  HCAP RLL on CXR, resp failure acute, intubated and tfr to PCCM .  Antibiotics:   ceftx 4/7 >>4/11  4/11 Vanco (HCAP)> 4/14>>>4/15>>>4/16 dc 4/11 zoysn(HCAP)>>  Cultures/Sepsis Markers:   4/7 urine >> e. Coli S to ceftx  4/11 resp c/s > rare wbc, no org seen >>> nl flora 4/11 blood x2 >>  Access/Protocols:  ETT 4/11>>> CVL L IJ 4/11>>>4/15 4/15 rt i j cvl>>  Best Practice: DVT: SCD's GI: Protonix  Subjective: No events overnight.  Physical Exam: Temp:  [99.3 F (37.4 C)-101.5 F (38.6 C)] 99.6 F (37.6 C) (04/16 0800) Pulse Rate:  [45-132] 96  (04/16 0700) Resp:  [18-34] 25  (04/16 0700) BP: (89-152)/(42-85) 150/55 mmHg (04/16 0700) SpO2:  [99 %-100 %] 100 % (04/16 0700) FiO2 (%):  [30 %] 30 % (04/16 0805) Weight:  [165 lb 12.6 oz (75.2 kg)] 165 lb 12.6 oz (75.2 kg) (04/16 0500)  Intake/Output Summary (Last 24 hours) at 05/04/11 0824 Last data filed at 05/04/11 0600  Gross per 24 hour  Intake 2879.13 ml  Output   5755 ml  Net -2875.87 ml   Vent Mode:  [-] PSV;CPAP FiO2 (%):  [30 %] 30 % Set Rate:  [16 bmp] 16 bmp Vt Set:  [480 mL] 480 mL PEEP:  [5 cmH20] 5 cmH20 Pressure Support:  [5 cmH20-8 cmH20] 5 cmH20 Plateau Pressure:  [12 cmH20-15 cmH20] 15 cmH20  Neuro: Sedated and intubated.No follow commands when sedation decreased Cardiac: HSIR IR afib Pulmonary: Coarse BS diffusely. GI: Soft, NT, ND and +BS. Extremities: 2+ edema and -tenderness with right  forearm erythema.  Labs:  Lab 05/04/11 0500 05/03/11 0330 05/02/11 0408  NA 142 140 146*  K 4.1 3.2* 3.1*  CL 104 107 114*  CO2 28 25 24   BUN 26* 23 23  CREATININE 1.34 1.15 1.20  GLUCOSE 154* 169* 169*    Lab 05/04/11 0500 05/03/11 0330 05/01/11 0415  HGB 8.3* 7.7* 7.7*  HCT 25.5* 24.2* 23.2*  WBC 14.0* 11.2* 8.6  PLT 196 153 167   ABG    Component Value Date/Time   PHART 7.470* 04/29/2011 1316   PCO2ART 28.1* 04/29/2011 1316   PO2ART 370.0* 04/29/2011 1316   HCO3 20.2 04/29/2011 1316   TCO2 19.4 04/29/2011 1316   ACIDBASEDEF 2.7* 04/29/2011 1316   O2SAT 100.0 04/29/2011 1316  Dg Chest Port 1 View  05/04/2011  *RADIOLOGY REPORT*  Clinical Data: Ventilator.  PORTABLE CHEST - 1 VIEW  Comparison: 05/03/2011  Findings: Support devices are unchanged.  Heart is borderline in size.  Bibasilar atelectasis, which appears slightly more pronounced, but likely is related to positioning and technique.  No effusions.  No acute bony abnormality.  IMPRESSION: Slight bibasilar atelectasis.  Doubt significant change.  Original Report Authenticated By: Cyndie Chime, M.D.   Dg Chest Port 1 View  05/03/2011  *RADIOLOGY REPORT*  Clinical Data: Central venous line  PORTABLE CHEST - 1 VIEW  Comparison:  05/03/2011  Findings: Cardiomediastinal silhouette is stable.  Status post  median sternotomy again noted.  Stable endotracheal and NG tube position.  There is a right IJ central venous line with tip crossing the midline at the level of the aortic arch probable within the azygos vein.  No diagnostic pneumothorax.  IMPRESSION: Stable support apparatus. There is a right IJ central venous line with tip crossing the midline at the level of the aortic arch probable within the azygos vein.  No diagnostic pneumothorax.  Original Report Authenticated By: Natasha Mead, M.D.   Dg Chest Port 1 View  05/03/2011  *RADIOLOGY REPORT*  Clinical Data: Evaluate endotracheal tube  PORTABLE CHEST - 1 VIEW  Comparison: 05/02/2011;  04/30/2011; 04/29/2011  Findings:  Grossly unchanged cardiac silhouette and mediastinal contours post median sternotomy.  Interval removal of left arm/chest central venous catheter.  Otherwise, stable positioning of support apparatus.  The cuff of the endotracheal tube may be slightly overinflated.  No pneumothorax.  No significant change in bibasilar heterogeneous / consolidative opacities, left greater than right. Query trace bilateral pleural effusions.  Grossly unchanged bones.  IMPRESSION: 1.  Interval removal of left-sided central venous catheter. Otherwise, stable positioning remain support apparatus.  Of note, the cuff of the endotracheal tube may be slightly overinflated.  No pneumothorax. 2.  Grossly unchanged bibasilar heterogeneous consolidative opacities, left greater than right, atelectasis versus infiltrate.  This was made a call report.  Original Report Authenticated By: Waynard Reeds, M.D.   Assessment & Plan: ETOH abuse/ withdrawal with hematuria, presumed radiation cystitis with febrile transfusion reactions, intubated 4/11 for HCAP/poor mental status now with cellulitis of the right forearm.  Neuro: Toxic/metabolic encephalopathy, no evidence of CVA on CT, on fentanyl and PRN benzos for withdrawal. Plan: EEG.  Neuro consult if not awake after adequate treatment of infection.  Daily WUA  Minimize sedation, change fentanyl/versed to PRN  Cardiac: Recent surgery, 2D echo from 4/10 with EF 65%, grade 1 diastolic dysfunction and PAP of 53. Afib on amio and bb. Careful with diuresis Plan: Diurese   KVO IVF  Continue NGT flushes  Pulmonary: VDRF due to PNA and acute on chronic diastolic CHF.  In respiratory alkalosis. Plan: Decrease vent rate once on full support  Maintain on PS as tolerated with full vent support at night  Daily SBTs wean as tolerated             Check cvp qs  GI: tolerating TF well with normal BS. Plan: Continue TF.  Renal: UOP low with evidence of fluid  overload on CXR. Lab Results  Component Value Date   CREATININE 1.34 05/04/2011   CREATININE 1.15 05/03/2011   CREATININE 1.20 05/02/2011   CREATININE 1.05 01/29/2011  Plan: Replace K aggressively.  Lab 05/04/11 0500 05/03/11 0330 05/02/11 0408  K 4.1 3.2* 3.1*   IV Lasix 40 q6 x3 doses. 4/15  Recheck BMP  KVO IVF             Check cvp  ID: PNA and right forearm cellulitis Plan: Continue vanc/zosyn . 4/16 dc vanc  Cultures are all negative xcept bc are pending as of 4/16  Endocrine: ISS.  Brett Canales Minor ACNP [facility] PCCM Pager [phone] till 3 pm If no answer page [phone] 05/04/2011, 8:24 AM  Patient weaning very well today but mental status remains very poor, not enough to support extubation at this point.  Will have neuro come evaluate the patient and order an EEG today.  Will likely show diffuse slowing.  Will need additional conversation with family regarding  extubation with no intention to re-intubate vs trach.  CC time 35 minutes.  Patient seen and examined, agree with above note.  I dictated the care and orders written for this patient under my direction.  Koren Bound, M.D. [phone]

## 2011-05-04 NOTE — Progress Notes (Signed)
Subjective: Remains intubated tonight. Pt's wife reports that he may be extubated tomorrow. Her expectation is that he will be awake and alert.   Objective: Vital signs in last 24 hours: Temp:  [98.4 F (36.9 C)-101.4 F (38.6 C)] 98.4 F (36.9 C) (04/16 1600) Pulse Rate:  [45-132] 84  (04/16 1800) Resp:  [20-34] 21  (04/16 1800) BP: (89-150)/(46-85) 111/54 mmHg (04/16 1800) SpO2:  [99 %-100 %] 100 % (04/16 1800) FiO2 (%):  [30 %] 30 % (04/16 1600) Weight:  [75.2 kg (165 lb 12.6 oz)] 75.2 kg (165 lb 12.6 oz) (04/16 0500)A  Intake/Output from previous day: 04/15 0701 - 04/16 0700 In: 3298.4 [I.V.:7.9; NG/GT:2595; IV Piggyback:395.5] Out: 5850 [Urine:5850] Intake/Output this shift: Total I/O In: 1282.5 [Other:120; NG/GT:1100; IV Piggyback:62.5] Out: 550 [Urine:550]  Past Medical History  Diagnosis Date  . ADENOCARCINOMA, PROSTATE   . ALLERGIC RHINITIS   . B12 DEFICIENCY   . BENIGN PROSTATIC HYPERTROPHY   . CAROTID ARTERY DISEASE   . CEREBROVASCULAR ACCIDENT, HX OF   . CORONARY ARTERY DISEASE   . DIVERTICULOSIS, COLON   . GERD   . HEMORRHOIDS   . HIATAL HERNIA   . HYPERLIPIDEMIA   . HYPERTENSION   . OSTEOPENIA   . PEPTIC ULCER DISEASE   . PERIPHERAL VASCULAR DISEASE   . Personal history of alcoholism   . PROSTATE CANCER, HX OF   . PUD, HX OF   . TRANSIENT ISCHEMIC ATTACK   . Shingles   . Anemia   . AS (aortic stenosis)   . S/P AVR (aortic valve replacement) 09/23/2010  . Complication of anesthesia 09/23/2010    very confused after waking up. Stopped drinking 40 days before this surgery.  . Transfusion reaction, chill fever type 04/28/2011    Fever, rigors, tachycardia,tachypnea but no evidence of hemolysis  . Hemorrhagic cystitis 04/28/2011    Due to previous radiation for PSA recurrence of prostate cancer    Physical Exam:  General: Thin w m in NAD, ventillator dependent.  Lungs - Normal respiratory effort, chest expands symmetrically.  Abdomen - Soft,  non-tender & non-distended.  Lab Results:  Basename 05/04/11 0500 05/03/11 0330  WBC 14.0* 11.2*  HGB 8.3* 7.7*  HCT 25.5* 24.2*   BMET  Basename 05/04/11 0500 05/03/11 0330  NA 142 140  K 4.1 3.2*  CL 104 107  CO2 28 25  GLUCOSE 154* 169*  BUN 26* 23  CREATININE 1.34 1.15  CALCIUM 8.4 7.9*   No results found for this basename: LABURIN:1 in the last 72 hours Results for orders placed during the hospital encounter of 04/25/11  URINE CULTURE     Status: Normal   Collection Time   04/25/11  4:24 AM      Component Value Range Status Comment   Specimen Description URINE, CATHETERIZED   Final    Special Requests NONE   Final    Culture  Setup Time [id number]   Final    Colony Count >=100,000 COLONIES/ML   Final    Culture ESCHERICHIA COLI   Final    Report Status 04/27/2011 FINAL   Final    Organism ID, Bacteria ESCHERICHIA COLI   Final   MRSA PCR SCREENING     Status: Normal   Collection Time   04/25/11  6:54 PM      Component Value Range Status Comment   MRSA by PCR NEGATIVE  NEGATIVE  Final   CULTURE, BLOOD (ROUTINE X 2)     Status: Normal (Preliminary  result)   Collection Time   04/29/11 12:45 PM      Component Value Range Status Comment   Specimen Description BLOOD CL   Final    Special Requests BOTTLES DRAWN AEROBIC AND ANAEROBIC 10CC   Final    Culture  Setup Time [id number]   Final    Culture     Final    Value:        BLOOD CULTURE RECEIVED NO GROWTH TO DATE CULTURE WILL BE HELD FOR 5 DAYS BEFORE ISSUING A FINAL NEGATIVE REPORT   Report Status PENDING   Incomplete   CULTURE, BLOOD (ROUTINE X 2)     Status: Normal (Preliminary result)   Collection Time   04/29/11  4:15 PM      Component Value Range Status Comment   Specimen Description BLOOD RIGHT ARM   Final    Special Requests BOTTLES DRAWN AEROBIC AND ANAEROBIC 4 CC EACH   Final    Culture  Setup Time [id number]   Final    Culture     Final    Value:        BLOOD CULTURE RECEIVED NO GROWTH TO DATE CULTURE  WILL BE HELD FOR 5 DAYS BEFORE ISSUING A FINAL NEGATIVE REPORT   Report Status PENDING   Incomplete   CULTURE, RESPIRATORY     Status: Normal   Collection Time   04/29/11  4:29 PM      Component Value Range Status Comment   Specimen Description TRACHEAL ASPIRATE   Final    Special Requests Immunocompromised   Final    Gram Stain     Final    Value: RARE WBC PRESENT,BOTH PMN AND MONONUCLEAR     RARE SQUAMOUS EPITHELIAL CELLS PRESENT     NO ORGANISMS SEEN   Culture Non-Pathogenic Oropharyngeal-type Flora Isolated.   Final    Report Status 05/02/2011 FINAL   Final     Studies/Results: @RISRSLT24 @  Assessment/Plan: Urine clear tonight. However, I think the  Patient's wife has unclear expectation of his ability to be independent of the ventilator. P: per CCM  Curt Oatis I 05/04/2011, 6:04 PM

## 2011-05-04 NOTE — Progress Notes (Signed)
eLink Physician-Brief Progress Note Patient Name: [name] DOB: January 27, 1926 MRN: [id number]  Date of Service  05/04/2011   HPI/Events of Note   RASS + 3 off sedation. No sitter available  eICU Interventions  Precedex gtt + fent prn   Intervention Category Major Interventions: Delirium, psychosis, severe agitation - evaluation and management  Lenn Volker 05/04/2011, 10:06 PM

## 2011-05-04 NOTE — Progress Notes (Signed)
PROGRESS NOTE  Subjective:    [name] is a 78 y.o. male with a history of OHS/AoVR with CABG September 2012. 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 He had anemia requiring transfusions, then had a transfusion reaction which was treated. He had confusion and agitation, requiring sedation, and was also going through ETOH withdrawal.  He was seen by hematology as well. He was noted to have worsening pulmonary edema on CXR and cardiology was asked to evaluate him. The patient has received sedation. His wife is present. He is not able to answer questions and per the nursing staff, is very agitated unless sedated. His wife states he has not complained of chest pain recently but did get her to feel his heart because he stated it felt different, but did not say why.  He is still intubated for resp. Failure and pneumonia. Remains in AF-we changed his IV amiodarone to PO / NG amiodarone yesterday  Pt remains on vent - sedated and unresponsive.  Objective:    Vital Signs:   Temp:  [99.3 F (37.4 C)-101.5 F (38.6 C)] 99.7 F (37.6 C) (04/16 0400) Pulse Rate:  [45-132] 96  (04/16 0700) Resp:  [18-34] 25  (04/16 0700) BP: (89-152)/(42-85) 150/55 mmHg (04/16 0700) SpO2:  [99 %-100 %] 100 % (04/16 0700) FiO2 (%):  [30 %] 30 % (04/16 0430) Weight:  [165 lb 12.6 oz (75.2 kg)] 165 lb 12.6 oz (75.2 kg) (04/16 0500)  Last BM Date: 05/04/11   24-hour weight change: Weight change: -5 lb 8.2 oz (-2.5 kg)  Weight trends: Filed Weights   05/02/11 0000 05/03/11 0500 05/04/11 0500  Weight: 164 lb 10.9 oz (74.7 kg) 171 lb 4.8 oz (77.7 kg) 165 lb 12.6 oz (75.2 kg)    Intake/Output:  04/15 0701 - 04/16 0700 In: 3190.9 [I.V.:7.9; NG/GT:2520; IV Piggyback:383] Out: 5850 [Urine:5850]     Physical Exam: BP 150/55  Pulse 96  Temp(Src) 99.7 F (37.6 C) (Oral)  Resp 25  Ht 5\' 11"  (1.803 m)  Wt 165 lb 12.6 oz  (75.2 kg)  BMI 23.12 kg/m2  SpO2 100%  General: Vital signs reviewed and noted. Intubated, sedated, on vent.  Head: Normocephalic, atraumatic.  Eyes: conjunctivae/corneas clear. PERRL, EOM's intact. Fundi benign.  Throat: Oropharynx nonerythematous, no exudate appreciated.   Neck: Supple. Normal carotids. No JVD  Lungs:  On vent. Lungs sound a bit better this am  Heart: Irregularly irregular. 2/6 systolic murmur, tachy.  Abdomen:  Soft, non-tender, non-distended with normoactive bowel sounds. No hepatomegaly. No rebound/guarding. No abdominal masses.  Extremities: No edema.  Distal pedal pulses are 2+ and equal bilaterally.  Neurologic: sedated  Psych:     Labs: BMET:  Basename 05/04/11 0500 05/03/11 0330  NA 142 140  K 4.1 3.2*  CL 104 107  CO2 28 25  GLUCOSE 154* 169*  BUN 26* 23  CREATININE 1.34 1.15  CALCIUM 8.4 7.9*  MG 1.9 --  PHOS 3.8 --    Liver function tests: No results found for this basename: AST:2,ALT:2,ALKPHOS:2,BILITOT:2,PROT:2,ALBUMIN:2 in the last 72 hours No results found for this basename: LIPASE:2,AMYLASE:2 in the last 72 hours  CBC:  Basename 05/04/11 0500 05/03/11 0330  WBC 14.0* 11.2*  NEUTROABS -- --  HGB 8.3* 7.7*  HCT 25.5* 24.2*  MCV 90.7 90.6  PLT 196 153    Cardiac Enzymes: No results found for this basename: CKTOTAL:4,CKMB:4,TROPONINI:4 in the last 72 hours  Coagulation Studies: No results found for this basename: LABPROT:5,INR:5 in the last 72 hours  Other:  Tele:  A-Fib at 90   Medications:    Infusions:  Scheduled Medications:    . acetaminophen  650 mg Oral Once  . amiodarone  200 mg Oral BID  . antiseptic oral rinse  15 mL Mouth Rinse BID  . chlorhexidine  15 mL Mouth/Throat QID  . clotrimazole   Topical BID  . folic acid  1 mg Intravenous Daily  . free water  300 mL Per Tube Q6H  . furosemide  40 mg Intravenous Q6H  . insulin aspart  0-15 Units Subcutaneous Q4H  . ipratropium  0.5 mg Nebulization Q6H  .  levalbuterol  0.63 mg Nebulization Q6H  . metoCLOPramide  10 mg Per Tube Q6H  . midazolam      . midazolam      . midazolam      . midazolam      . midazolam      . pantoprazole sodium  40 mg Per Tube Q1200  . piperacillin-tazobactam (ZOSYN)  IV  3.375 g Intravenous Q8H  . potassium chloride  40 mEq Per Tube TID  . sodium chloride  3 mL Intravenous Q12H  . thiamine  100 mg Intravenous Daily  . vancomycin  1,250 mg Intravenous Q24H  . vancomycin  1,250 mg Intravenous NOW  . DISCONTD: free water  200 mL Per Tube Q6H    Assessment/ Plan:    1. A-Fib: will change to PO (NG) amiodarone.  His HR remains a bit high.  Will try low dose Metoprolol 25  Bid. (suspension)   Disposition:  Length of Stay: 9  Vesta Mixer, Montez Hageman., MD, [facility] 05/04/2011, 7:35 AM

## 2011-05-05 ENCOUNTER — Inpatient Hospital Stay (HOSPITAL_COMMUNITY)
Admit: 2011-05-05 | Discharge: 2011-05-05 | Disposition: A | Discharge status: Home / Self Care | Payer: Medicare Other | Attending: Pulmonary Disease | Admitting: Pulmonary Disease

## 2011-05-05 ENCOUNTER — Inpatient Hospital Stay (HOSPITAL_COMMUNITY): Payer: Medicare Other

## 2011-05-05 LAB — GLUCOSE, CAPILLARY
Glucose-Capillary: 111 mg/dL — ABNORMAL HIGH (ref 70–99)
Glucose-Capillary: 159 mg/dL — ABNORMAL HIGH (ref 70–99)

## 2011-05-05 LAB — BASIC METABOLIC PANEL
Chloride: 106 mEq/L (ref 96–112)
Creatinine, Ser: 1.2 mg/dL (ref 0.50–1.35)
GFR calc Af Amer: 63 mL/min — ABNORMAL LOW (ref >90)
Potassium: 3.8 mEq/L (ref 3.5–5.1)
Sodium: 143 mEq/L (ref 135–145)

## 2011-05-05 LAB — CBC
Platelets: 214 10*3/uL (ref 150–400)
RDW: 14.6 % (ref 11.5–15.5)
WBC: 11.6 10*3/uL — ABNORMAL HIGH (ref 4.0–10.5)

## 2011-05-05 LAB — MAGNESIUM: Magnesium: 2 mg/dL (ref 1.5–2.5)

## 2011-05-05 LAB — PHOSPHORUS: Phosphorus: 3.7 mg/dL (ref 2.3–4.6)

## 2011-05-05 LAB — CLOSTRIDIUM DIFFICILE BY PCR: Toxigenic C. Difficile by PCR: NEGATIVE

## 2011-05-05 MED ORDER — POTASSIUM CHLORIDE 20 MEQ/15ML (10%) PO LIQD
40.0000 meq | Freq: Three times a day (TID) | ORAL | Status: AC
  Administered 2011-05-05 (×2): 40 meq
  Filled 2011-05-05 (×2): qty 30

## 2011-05-05 MED ORDER — HALOPERIDOL LACTATE 5 MG/ML IJ SOLN
5.0000 mg | Freq: Once | INTRAMUSCULAR | Status: AC
Start: 2011-05-05 — End: 2011-05-05
  Administered 2011-05-05: 5 mg via INTRAVENOUS
  Filled 2011-05-05: qty 1

## 2011-05-05 MED ORDER — FUROSEMIDE 10 MG/ML IJ SOLN
20.0000 mg | Freq: Four times a day (QID) | INTRAMUSCULAR | Status: AC
  Administered 2011-05-05 (×2): 20 mg via INTRAVENOUS
  Filled 2011-05-05 (×3): qty 2

## 2011-05-05 MED ORDER — SODIUM CHLORIDE 0.9 % IV SOLN
0.2000 ug/kg/h | INTRAVENOUS | Status: DC
  Administered 2011-05-05: 1.1 ug/kg/h via INTRAVENOUS
  Administered 2011-05-05: 0.8 ug/kg/h via INTRAVENOUS
  Administered 2011-05-05: 0.3 ug/kg/h via INTRAVENOUS
  Administered 2011-05-05: 0.7 ug/kg/h via INTRAVENOUS
  Administered 2011-05-05: 0.4 ug/kg/h via INTRAVENOUS
  Administered 2011-05-06: 0.043 ug/kg/h via INTRAVENOUS
  Filled 2011-05-05 (×4): qty 4

## 2011-05-05 NOTE — Progress Notes (Signed)
HPI:  23 yowm with prostate cancer, active ETOH use, CVA, CAD, HTN, PVD, AS post valve replacement. Adm 4/7 with gross hematuria secondary to radiation cystitis. Critical care consult called by Dr. Ashley Royalty on 4/7 for evaluation of high grade fevers, chills , tachypnea during transfusion , recurred 4/8 >> attributed to minor non hemolytic reaction, tolerated transfusion 4/9 with pre-medication.  Course complicated by ETOH withdrawal, e.coli UTI & RVR  4/10 PCCM signed off.  4/11 PCCM reconsulted for agitation.  HCAP RLL on CXR, resp failure acute, intubated and tfr to PCCM .  Antibiotics:   ceftx 4/7 >>4/11  4/11 Vanco (HCAP)> 4/14>>>4/15>>>4/16 dc 4/11 zoysn(HCAP)>>  Cultures/Sepsis Markers:   4/7 urine >> e. Coli S to ceftx  4/11 resp c/s > rare wbc, no org seen >>> nl flora 4/11 blood x2 >>  Access/Protocols:  ETT 4/11>>> CVL L IJ 4/11>>>4/15 4/15 rt i j cvl>>4/16 pt pulled out  Best Practice: DVT: SCD's GI: Protonix  Subjective: No events overnight. Getting eeg 4/17  Physical Exam: Temp:  [97.8 F (36.6 C)-101.4 F (38.6 C)] 97.8 F (36.6 C) (04/17 0800) Pulse Rate:  [52-98] 55  (04/17 0900) Resp:  [19-28] 19  (04/17 0900) BP: (77-151)/(39-78) 94/51 mmHg (04/17 0900) SpO2:  [99 %-100 %] 100 % (04/17 0900) FiO2 (%):  [30 %] 30 % (04/17 0955)  Intake/Output Summary (Last 24 hours) at 05/05/11 1022 Last data filed at 05/05/11 1000  Gross per 24 hour  Intake 2241.33 ml  Output    645 ml  Net 1596.33 ml   Vent Mode:  [-] CPAP FiO2 (%):  [30 %] 30 % Set Rate:  [16 bmp] 16 bmp Vt Set:  [480 mL] 480 mL PEEP:  [5 cmH20] 5 cmH20 Pressure Support:  [5 cmH20] 5 cmH20 Plateau Pressure:  [12 cmH20-14 cmH20] 12 cmH20  Neuro: Sedated and intubated.No follow commands when sedation decreased Cardiac: HSIR IR afib Pulmonary: Coarse BS diffusely. GI: Soft, NT, ND and +BS. Extremities: 2+ edema and -tenderness with right forearm erythema.  Labs:  Lab 05/05/11 0330 05/04/11  0500 05/03/11 0330  NA 143 142 140  K 3.8 4.1 3.2*  CL 106 104 107  CO2 29 28 25   BUN 27* 26* 23  CREATININE 1.20 1.34 1.15  GLUCOSE 105* 154* 169*    Lab 05/05/11 0330 05/04/11 0500 05/03/11 0330  HGB 7.2* 8.3* 7.7*  HCT 21.8* 25.5* 24.2*  WBC 11.6* 14.0* 11.2*  PLT 214 196 153   ABG    Component Value Date/Time   PHART 7.510* 05/04/2011 0500   PCO2ART 34.7* 05/04/2011 0500   PO2ART 125.0* 05/04/2011 0500   HCO3 27.4* 05/04/2011 0500   TCO2 25.4 05/04/2011 0500   ACIDBASEDEF 2.7* 04/29/2011 1316   O2SAT 99.2 05/04/2011 0500  Dg Chest Port 1 View  05/05/2011  *RADIOLOGY REPORT*  Clinical Data: Respiratory distress, endotracheal tube  PORTABLE CHEST - 1 VIEW  Comparison: 05/04/2011; 05/03/2011; 04/22/2011  Findings: The patient is again rotated to the left.  Grossly unchanged cardiac silhouette and mediastinal contour post median sternotomy and CABG. Interval removal of the right jugular approach central venous catheter.  Otherwise, stable positioning of support apparatus.  No pneumothorax.  Minimal increase in heterogeneous interstitial opacities within the left upper lung.  Unchanged bilateral infrahilar heterogeneous opacities.  No definite pleural effusion.  Grossly unchanged bones.  IMPRESSION: 1.  Interval removal of right jugular approach central venous catheter otherwise, stable positioning of remaining support apparatus.  No pneumothorax. 2.  Minimal  increase in left upper and mid lung heterogeneous opacities nonspecific but may represent asymmetric pulmonary edema versus atypical infection.  Continued attention on follow-up is recommended.  Original Report Authenticated By: Waynard Reeds, M.D.   Dg Chest Port 1 View  05/04/2011  *RADIOLOGY REPORT*  Clinical Data: Ventilator.  PORTABLE CHEST - 1 VIEW  Comparison: 05/03/2011  Findings: Support devices are unchanged.  Heart is borderline in size.  Bibasilar atelectasis, which appears slightly more pronounced, but likely is related to  positioning and technique.  No effusions.  No acute bony abnormality.  IMPRESSION: Slight bibasilar atelectasis.  Doubt significant change.  Original Report Authenticated By: Cyndie Chime, M.D.   Dg Chest Port 1 View  05/03/2011  *RADIOLOGY REPORT*  Clinical Data: Central venous line  PORTABLE CHEST - 1 VIEW  Comparison:  05/03/2011  Findings: Cardiomediastinal silhouette is stable.  Status post median sternotomy again noted.  Stable endotracheal and NG tube position.  There is a right IJ central venous line with tip crossing the midline at the level of the aortic arch probable within the azygos vein.  No diagnostic pneumothorax.  IMPRESSION: Stable support apparatus. There is a right IJ central venous line with tip crossing the midline at the level of the aortic arch probable within the azygos vein.  No diagnostic pneumothorax.  Original Report Authenticated By: Natasha Mead, M.D.   Dg Abd Portable 1v  05/04/2011  *RADIOLOGY REPORT*  Clinical Data: Panda tube placement  PORTABLE ABDOMEN - 1 VIEW  Comparison: 04/29/2011  Findings:  Enteric tube tip projects over the expected location of the gastric antrum/duodenal bulb.  Limited visualization of the lower thorax demonstrates the sequela of prior median sternotomy.  Visualized bowel gas pattern is normal.  IMPRESSION: Enteric tube tip projects over the expected location of the gastric antrum/duodenal bulb.  Original Report Authenticated By: Waynard Reeds, M.D.   Assessment & Plan: ETOH abuse/ withdrawal with hematuria, presumed radiation cystitis with febrile transfusion reactions, intubated 4/11 for HCAP/poor mental status now with cellulitis of the right forearm.  Neuro: Toxic/metabolic encephalopathy, no evidence of CVA on CT, on fentanyl and PRN benzos for withdrawal. Plan: EEG.  Neuro consult if not awake after adequate treatment of infection. Called 4/17   Daily WUA  Minimize sedation, change fentanyl/versed to PRN  Cardiac: Recent surgery, 2D  echo from 4/10 with EF 65%, grade 1 diastolic dysfunction and PAP of 53. Afib on amio and bb. Careful with diuresis Plan: Diurese   KVO IVF  Continue NGT flushes  Pulmonary: VDRF due to PNA and acute on chronic diastolic CHF.  In respiratory alkalosis. Plan: Decrease vent rate once on full support  Maintain on PS as tolerated with full vent support at night then SBT in AM.  Daily SBTs wean as tolerated             Check cvp qshift  Will need to speak with wife prior to extubation to determine whether the family wishes for a trach/peg or not incase of respiratory failure again.  GI: tolerating TF well with normal BS. Plan: Continue TF.  Renal: UOP low with evidence of fluid overload on CXR. Lab Results  Component Value Date   CREATININE 1.20 05/05/2011   CREATININE 1.34 05/04/2011   CREATININE 1.15 05/03/2011   CREATININE 1.05 01/29/2011  Plan: Replace K aggressively.  Lab 05/05/11 0330 05/04/11 0500 05/03/11 0330  K 3.8 4.1 3.2*   IV Lasix 40 q6 x3 doses. 4/15  Recheck BMP  KVO IVF             Check cvp  ID: PNA and right forearm cellulitis Plan: Continue vanc/zosyn . 4/16 dc vanc  Cultures are all negative xcept bc are pending as of 4/17  Diarrhea -dc Reglan 4/17  Endocrine: ISS.  Brett Canales Minor ACNP [facility]ack PCCM Pager [phone] till 3 pm If no answer page [phone] 05/05/2011, 10:22 AM  CC time 35 min.  Patient seen and examined, agree with above note.  I dictated the care and orders written for this patient under my direction.  Koren Bound, M.D. [phone]

## 2011-05-05 NOTE — Progress Notes (Signed)
PROGRESS NOTE  Subjective:    [name] is a 78 y.o. male with a history of OHS/AoVR with CABG September 2012. 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 He had anemia requiring transfusions, then had a transfusion reaction which was treated. He had confusion and agitation, requiring sedation, and was also going through ETOH withdrawal.  He was seen by hematology as well. He was noted to have worsening pulmonary edema on CXR and cardiology was asked to evaluate him. The patient has received sedation. His wife is present. He is not able to answer questions and per the nursing staff, is very agitated unless sedated. His wife states he has not complained of chest pain recently but did get her to feel his heart because he stated it felt different, but did not say why.  He is still intubated for resp. Failure and pneumonia. Remains in AF-we changed his IV amiodarone to PO / NG amiodarone yesterday  Pt remains on vent - sedated and unresponsive.  4/17 - He has been having some episodes of NSVT overnight. Currently in NSR at 55   Objective:    Vital Signs:   Temp:  [98.2 F (36.8 C)-101.4 F (38.6 C)] 98.4 F (36.9 C) (04/16 2330) Pulse Rate:  [53-112] 53  (04/17 0700) Resp:  [20-28] 21  (04/17 0700) BP: (77-151)/(39-78) 97/46 mmHg (04/17 0700) SpO2:  [99 %-100 %] 100 % (04/17 0700) FiO2 (%):  [30 %] 30 % (04/17 0420)  Last BM Date: 05/04/11   24-hour weight change: Weight change:   Weight trends: Filed Weights   05/02/11 0000 05/03/11 0500 05/04/11 0500  Weight: 164 lb 10.9 oz (74.7 kg) 171 lb 4.8 oz (77.7 kg) 165 lb 12.6 oz (75.2 kg)    Intake/Output:  04/16 0701 - 04/17 0700 In: 2413.1 [I.V.:90.6; NG/GT:2000; IV Piggyback:162.5] Out: 825 [Urine:825]     Physical Exam: BP 97/46  Pulse 53  Temp(Src) 98.4 F (36.9 C) (Oral)  Resp 21  Ht 5\' 11"  (1.803 m)  Wt 165 lb 12.6 oz (75.2 kg)   BMI 23.12 kg/m2  SpO2 100%  General: Vital signs reviewed and noted. Intubated, sedated, on vent.  Head: Normocephalic, atraumatic.  Eyes: conjunctivae/corneas clear. PERRL, EOM's intact. Fundi benign.  Throat: Oropharynx nonerythematous, no exudate appreciated.   Neck: Supple. Normal carotids. No JVD  Lungs:  On vent. Lungs sound a bit better this am  Heart: RR. 2/6 systolic murmur,  Abdomen:  Soft, non-tender, non-distended with normoactive bowel sounds. No hepatomegaly. No rebound/guarding. No abdominal masses.  Extremities: No edema.  Distal pedal pulses are 2+ and equal bilaterally.  Neurologic: sedated  Psych:     Labs: BMET:  Basename 05/05/11 0330 05/04/11 0500  NA 143 142  K 3.8 4.1  CL 106 104  CO2 29 28  GLUCOSE 105* 154*  BUN 27* 26*  CREATININE 1.20 1.34  CALCIUM 8.3* 8.4  MG 2.0 1.9  PHOS 3.7 3.8    Liver function tests: No results found for this basename: AST:2,ALT:2,ALKPHOS:2,BILITOT:2,PROT:2,ALBUMIN:2 in the last 72 hours No results found for this basename: LIPASE:2,AMYLASE:2 in the last 72 hours  CBC:  Basename 05/05/11 0330 05/04/11 0500  WBC 11.6* 14.0*  NEUTROABS -- --  HGB 7.2* 8.3*  HCT 21.8* 25.5*  MCV 89.7 90.7  PLT 214 196    Cardiac Enzymes: No results found for this basename: CKTOTAL:4,CKMB:4,TROPONINI:4 in the last 72 hours  Coagulation Studies: No results  found for this basename: LABPROT:5,INR:5 in the last 72 hours  Other:  Tele:  A-Fib at 90   Medications:    Infusions:  Scheduled Medications:    . acetaminophen  650 mg Oral Once  . amiodarone  200 mg Oral BID  . antiseptic oral rinse  15 mL Mouth Rinse BID  . chlorhexidine  15 mL Mouth/Throat QID  . clotrimazole   Topical BID  . folic acid  1 mg Intravenous Daily  . free water  300 mL Per Tube Q6H  . insulin aspart  0-15 Units Subcutaneous Q4H  . ipratropium  0.5 mg Nebulization Q6H  . levalbuterol  0.63 mg Nebulization Q6H  . metoCLOPramide  10 mg Per Tube Q6H    . metoprolol tartrate  25 mg Per Tube BID  . pantoprazole sodium  40 mg Per Tube Q1200  . piperacillin-tazobactam (ZOSYN)  IV  3.375 g Intravenous Q8H  . sodium chloride  3 mL Intravenous Q12H  . thiamine  100 mg Intravenous Daily  . DISCONTD: vancomycin  1,250 mg Intravenous Q24H    Assessment/ Plan:    1. A-Fib: continue amiodarone.  Cont. Metoprolol  2. NSVT:  LVEF is normal by echo 60-65%.  Cont. Metoprolol. Keep K above 4.0     Disposition:  Length of Stay: 10  Vesta Mixer, Montez Hageman., MD, [facility] 05/05/2011, 8:20 AM

## 2011-05-05 NOTE — Progress Notes (Signed)
eLink Physician-Brief Progress Note Patient Name: [name] DOB: Jul 21, 1926 MRN: [id number]  Date of Service  05/05/2011   HPI/Events of Note  Agitated per RN despite lower dose of precedex and fent prn. I am unable to camera in due to software glittch   eICU Interventions  Haldol 5mg  IV stat and reassess   Intervention Category Major Interventions: Delirium, psychosis, severe agitation - evaluation and management  [name] 05/05/2011, 10:11 PM

## 2011-05-05 NOTE — Progress Notes (Signed)
ANTIBIOTIC CONSULT NOTE - Follow up  Pharmacy Consult for Vanc, Zosyn Indication: pneumonia, cellulitis, UTI  Allergies  Allergen Reactions  . Tolterodine Tartrate     Patient Measurements: Height: 5\' 11"  (180.3 cm) Weight: 165 lb 12.6 oz (75.2 kg) IBW/kg (Calculated) : 75.3   Vital Signs: Temp: 97.8 F (36.6 C) (04/17 0800) Temp src: Oral (04/17 0800) BP: 94/51 mmHg (04/17 0900) Pulse Rate: 55  (04/17 0900)  Labs:  Basename 05/05/11 0330 05/04/11 0500 05/03/11 0330  WBC 11.6* 14.0* 11.2*  HGB 7.2* 8.3* 7.7*  PLT 214 196 153  LABCREA -- -- --  CREATININE 1.20 1.34 1.15   Estimated Creatinine Clearance: 49.6 ml/min (by C-G formula based on Cr of 1.2).   Microbiology: 4/7 mrsa pcr: negative 4/7 urine: >100k E.coli (sensitive to all agents tested except ampicillin) 4/11 Blood: NGTD 4/11 Trach asp:  Normal flora 4/16  Cdiff: pending   Medications:  Anti-infectives     Start     Dose/Rate Route Frequency Ordered Stop   05/03/11 1800   vancomycin (VANCOCIN) 1,250 mg in sodium chloride 0.9 % 250 mL IVPB  Status:  Discontinued        1,250 mg 166.7 mL/hr over 90 Minutes Intravenous Every 24 hours 05/02/11 1840 05/04/11 0838   05/02/11 1845   vancomycin (VANCOCIN) 1,250 mg in sodium chloride 0.9 % 250 mL IVPB        1,250 mg 166.7 mL/hr over 90 Minutes Intravenous NOW 05/02/11 1840 05/03/11 1845   04/29/11 1300   vancomycin (VANCOCIN) 1,250 mg in sodium chloride 0.9 % 250 mL IVPB  Status:  Discontinued        1,250 mg 166.7 mL/hr over 90 Minutes Intravenous Every 24 hours 04/29/11 1158 05/02/11 0824   04/29/11 1200  piperacillin-tazobactam (ZOSYN) IVPB 3.375 g       3.375 g 12.5 mL/hr over 240 Minutes Intravenous Every 8 hours 04/29/11 0936     04/27/11 0900   cefTRIAXone (ROCEPHIN) 1 g in dextrose 5 % 50 mL IVPB  Status:  Discontinued        1 g 100 mL/hr over 30 Minutes Intravenous Every 24 hours 04/27/11 0828 04/29/11 0853          Assessment:  17 YOM  with prostate cancer presents with gross hematuria due to radiation cystitis.  Received 2 doses Rocephin for E.coli UTI, then expanded coverage to Vanc/Zosyn for HCAP with aspiration, UTI and cellulitis  D#7 Zosyn (completed 6 days of vanc)  Cultures negative to date, except 4/7 urine: >100k E.coli  SCr stable/ improving.  CrCl ~ 50 ml/min  Goal of Therapy:  Vancomycin trough level of 10-15 for cellulitis  Plan:  Continue Zosyn 3.375g IV Q8H infused over 4hrs. Follow renal function, labs, and cultures as available.    Lynann Beaver PharmD, BCPS Pager [phone] 05/05/2011 10:27 AM

## 2011-05-05 NOTE — Progress Notes (Signed)
Portable EEG completed

## 2011-05-06 ENCOUNTER — Inpatient Hospital Stay (HOSPITAL_COMMUNITY): Payer: Medicare Other

## 2011-05-06 DIAGNOSIS — J811 Chronic pulmonary edema: Secondary | ICD-10-CM

## 2011-05-06 LAB — GLUCOSE, CAPILLARY
Glucose-Capillary: 162 mg/dL — ABNORMAL HIGH (ref 70–99)
Glucose-Capillary: 97 mg/dL (ref 70–99)

## 2011-05-06 LAB — CULTURE, BLOOD (ROUTINE X 2)
Culture  Setup Time: 201304120058
Culture  Setup Time: 201304120124
Culture: NO GROWTH

## 2011-05-06 LAB — BASIC METABOLIC PANEL
BUN: 36 mg/dL — ABNORMAL HIGH (ref 6–23)
Calcium: 8.2 mg/dL — ABNORMAL LOW (ref 8.4–10.5)
Creatinine, Ser: 1.39 mg/dL — ABNORMAL HIGH (ref 0.50–1.35)
GFR calc non Af Amer: 45 mL/min — ABNORMAL LOW (ref >90)
Glucose, Bld: 203 mg/dL — ABNORMAL HIGH (ref 70–99)
Sodium: 142 mEq/L (ref 135–145)

## 2011-05-06 LAB — CBC
MCH: 29.5 pg (ref 26.0–34.0)
MCHC: 32.6 g/dL (ref 30.0–36.0)
Platelets: 278 10*3/uL (ref 150–400)
RBC: 2.61 MIL/uL — ABNORMAL LOW (ref 4.22–5.81)
RDW: 14.7 % (ref 11.5–15.5)

## 2011-05-06 LAB — BLOOD GAS, ARTERIAL
Bicarbonate: 25.8 mEq/L — ABNORMAL HIGH (ref 20.0–24.0)
Drawn by: 34767
PEEP: 5 cmH2O
pH, Arterial: 7.496 — ABNORMAL HIGH (ref 7.350–7.450)
pO2, Arterial: 128 mmHg — ABNORMAL HIGH (ref 80.0–100.0)

## 2011-05-06 LAB — MAGNESIUM: Magnesium: 2.2 mg/dL (ref 1.5–2.5)

## 2011-05-06 MED ORDER — PANTOPRAZOLE SODIUM 40 MG IV SOLR
40.0000 mg | INTRAVENOUS | Status: DC
  Administered 2011-05-06 – 2011-05-10 (×5): 40 mg via INTRAVENOUS
  Filled 2011-05-06 (×6): qty 40

## 2011-05-06 MED ORDER — SODIUM CHLORIDE 0.9 % IV SOLN
INTRAVENOUS | Status: DC
  Administered 2011-05-06: 04:00:00 via INTRAVENOUS

## 2011-05-06 NOTE — Progress Notes (Signed)
eLink Physician-Brief Progress Note Patient Name: FREDERIK STANDLEY DOB: May 03, 1926 MRN: [id number]  Date of Service  05/06/2011   HPI/Events of Note   Post extubation house keeping orders  eICU Interventions  protonix iv changed to po   Intervention Category Intermediate Interventions: Best-practice therapies (e.g. DVT, beta blocker, etc.)  Hudsen Fei 05/06/2011, 3:54 PM

## 2011-05-06 NOTE — Procedures (Signed)
EEG NUMBER:  REFERRING PHYSICIAN:  Dr. Molli Knock.  HISTORY:  An 78 year old male with altered mental status.  MEDICATIONS:  NovoLog, Lopressor, Zosyn, vancomycin, amiodarone, Lotrimin, Reglan, folic acid and thiamine.  CONDITIONS OF RECORDING:  This is a 16 channel EEG carried out the patient in the awake state.  DESCRIPTION:  Throughout of the majority of the tracing, the background activity consists of a polymorphic delta rhythm that frequency of 3-4 Hz and is seen over both hemispheres.  This activity of low voltage and at times has superimposed theta activity.  This activity continues until the patient receive stimulus.  After the stimulus the background activity achieved a higher frequency.  There is noted to be a increased amounts of theta and some intermixed alpha rhythm as well.  There does continue to be some underlying polymorphic delta activity seen at times. Although this activity does consist for prolonged periods of time, by the end of the tracing the polymorphic delta rhythms return.  The are some occasional transient that resembled vertex central sharp transients associated with sleep.  Hypoventilation was not performed.  Intermittent photic stimulation failed to elicit any change in the tracing.  IMPRESSION:  This is an abnormal EEG secondary to general background slowing.  This finding can be seen in a diffuse disturbance that is etiologically nonspecific, but may include a metabolic encephalopathy among other possibilities.  No epileptiform activity is noted.          ______________________________ Thana Farr, MD    [id number] D:  05/05/2011 18:29:28  T:  05/06/2011 01:59:13  Job #:  [id number]

## 2011-05-06 NOTE — Progress Notes (Signed)
PROGRESS NOTE  Subjective:    [name] is a 78 y.o. male with a history of OHS/AoVR with CABG September 2012. 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 He had anemia requiring transfusions, then had a transfusion reaction which was treated. He had confusion and agitation, requiring sedation, and was also going through ETOH withdrawal.  He was seen by hematology as well. He was noted to have worsening pulmonary edema on CXR and cardiology was asked to evaluate him. The patient has received sedation. His wife is present. He is not able to answer questions and per the nursing staff, is very agitated unless sedated. His wife states he has not complained of chest pain recently but did get her to feel his heart because he stated it felt different, but did not say why.  He is still intubated for resp. Failure and pneumonia. Remains in AF-we changed his IV amiodarone to PO / NG amiodarone yesterday  Pt remains on vent - sedated and unresponsive.  4/17 - He has been having some episodes of NSVT overnight. Currently in NSR/ sinus brady at 55  Awake and alter.  Wants to have ETT out. Objective:    Vital Signs:   Temp:  [97.9 F (36.6 C)-100 F (37.8 C)] 97.9 F (36.6 C) (04/18 0000) Pulse Rate:  [51-120] 58  (04/18 0607) Resp:  [19-32] 26  (04/18 0607) BP: (87-202)/(36-121) 95/51 mmHg (04/18 0607) SpO2:  [98 %-100 %] 100 % (04/18 0607) FiO2 (%):  [30 %] 30 % (04/18 0759)  Last BM Date: 05/05/11   24-hour weight change: Weight change:   Weight trends: Filed Weights   05/02/11 0000 05/03/11 0500 05/04/11 0500  Weight: 164 lb 10.9 oz (74.7 kg) 171 lb 4.8 oz (77.7 kg) 165 lb 12.6 oz (75.2 kg)    Intake/Output:  04/17 0701 - 04/18 0700 In: 1590.1 [I.V.:101.1; NG/GT:1425; IV Piggyback:54] Out: 2418 [Urine:2415; Stool:3]     Physical Exam: BP 95/51  Pulse 58  Temp(Src) 97.9 F (36.6 C) (Oral)   Resp 26  Ht 5\' 11"  (1.803 m)  Wt 165 lb 12.6 oz (75.2 kg)  BMI 23.12 kg/m2  SpO2 100%  General: Vital signs reviewed and noted. Intubated, sedated, on vent.  Head: Normocephalic, atraumatic.  Eyes: conjunctivae/corneas clear. PERRL, EOM's intact. Fundi benign.  Throat: Oropharynx nonerythematous, no exudate appreciated.   Neck: Supple. Normal carotids. No JVD  Lungs:  On vent. Lungs sound a bit better this am  Heart: RR. 2/6 systolic murmur,  Abdomen:  Soft, non-tender, non-distended with normoactive bowel sounds. No hepatomegaly. No rebound/guarding. No abdominal masses.  Extremities: No edema.  Distal pedal pulses are 2+ and equal bilaterally.  Neurologic: sedated  Psych:     Labs: BMET:  Basename 05/06/11 0332 05/05/11 0330  NA 142 143  K 4.3 3.8  CL 106 106  CO2 27 29  GLUCOSE 203* 105*  BUN 36* 27*  CREATININE 1.39* 1.20  CALCIUM 8.2* 8.3*  MG 2.2 2.0  PHOS 3.6 3.7    Liver function tests: No results found for this basename: AST:2,ALT:2,ALKPHOS:2,BILITOT:2,PROT:2,ALBUMIN:2 in the last 72 hours No results found for this basename: LIPASE:2,AMYLASE:2 in the last 72 hours  CBC:  Basename 05/06/11 0332 05/05/11 0330  WBC 9.9 11.6*  NEUTROABS -- --  HGB 7.7* 7.2*  HCT 23.6* 21.8*  MCV 90.4 89.7  PLT 278 214    Cardiac Enzymes: No results found for this basename:  CKTOTAL:4,CKMB:4,TROPONINI:4 in the last 72 hours  Coagulation Studies: No results found for this basename: LABPROT:5,INR:5 in the last 72 hours  Other:  Tele: sinus brady  Medications:    Infusions:  Scheduled Medications:    . acetaminophen  650 mg Oral Once  . amiodarone  200 mg Oral BID  . antiseptic oral rinse  15 mL Mouth Rinse BID  . chlorhexidine  15 mL Mouth/Throat QID  . clotrimazole   Topical BID  . folic acid  1 mg Intravenous Daily  . free water  300 mL Per Tube Q6H  . furosemide  20 mg Intravenous Q6H  . haloperidol lactate  5 mg Intravenous Once  . insulin aspart  0-15  Units Subcutaneous Q4H  . ipratropium  0.5 mg Nebulization Q6H  . levalbuterol  0.63 mg Nebulization Q6H  . metoprolol tartrate  25 mg Per Tube BID  . pantoprazole sodium  40 mg Per Tube Q1200  . piperacillin-tazobactam (ZOSYN)  IV  3.375 g Intravenous Q8H  . potassium chloride  40 mEq Per Tube TID  . sodium chloride  3 mL Intravenous Q12H  . thiamine  100 mg Intravenous Daily  . DISCONTD: metoCLOPramide  10 mg Per Tube Q6H    Assessment/ Plan:    1. A-Fib: continue amiodarone. He has converted to sinus rhythm.   DC Metoprolol  2. NSVT:  LVEF is normal by echo 60-65%.   Keep K above 4.0.  He is bradycardic at baseline.  Will hold Metoprolol and observe.      Disposition:  Length of Stay: 31  Vesta Mixer, Montez Hageman., MD, [facility] 05/06/2011, 8:18 AM

## 2011-05-06 NOTE — Progress Notes (Signed)
CSW attended meeting with wife, sisters and Dr. Molli Knock to discuss plans for Pt. CSW there for support to wife. Wife coping adequately and appears very realistic currently. CSW will continue to assist and follow for support and dispo as appropriate.  Vennie Homans, [place] 10:52 AM 05/06/2011 [phone]

## 2011-05-06 NOTE — Progress Notes (Signed)
Pt extubated to 3L nasal cannula.  Cuff leak audible prior to extubation. Pt suctioned, and placed on 100% fio2 prior to extubation.  Pt tolerated well, stable throughout procedure.

## 2011-05-06 NOTE — Progress Notes (Signed)
HPI:  17 yowm with prostate cancer, active ETOH use, CVA, CAD, HTN, PVD, AS post valve replacement. Adm 4/7 with gross hematuria secondary to radiation cystitis. Critical care consult called by Dr. Ashley Royalty on 4/7 for evaluation of high grade fevers, chills , tachypnea during transfusion , recurred 4/8 >> attributed to minor non hemolytic reaction, tolerated transfusion 4/9 with pre-medication.  Course complicated by ETOH withdrawal, e.coli UTI & RVR  4/10 PCCM signed off.  4/11 PCCM reconsulted for agitation.  HCAP RLL on CXR, resp failure acute, intubated and tfr to PCCM .  Antibiotics:   ceftx 4/7 >>4/11  4/11 Vanco (HCAP)> 4/14>>>4/15>>>4/16 dc 4/11 zoysn(HCAP)>>  Cultures/Sepsis Markers:   4/7 urine >> e. Coli S to ceftx  4/11 resp c/s > rare wbc, no org seen >>> nl flora 4/11 blood x2 >>  Access/Protocols:  ETT 4/11>>> CVL L IJ 4/11>>>4/15 4/15 rt i j cvl>>4/16 pt pulled out  Best Practice: DVT: SCD's GI: Protonix  Subjective: No events overnight. Getting eeg 4/17  Physical Exam: Temp:  [97.9 F (36.6 C)-100 F (37.8 C)] 97.9 F (36.6 C) (04/18 0000) Pulse Rate:  [51-120] 58  (04/18 0607) Resp:  [19-32] 26  (04/18 0607) BP: (87-202)/(36-121) 95/51 mmHg (04/18 0607) SpO2:  [98 %-100 %] 100 % (04/18 0607) FiO2 (%):  [30 %] 30 % (04/18 0759)  Intake/Output Summary (Last 24 hours) at 05/06/11 0815 Last data filed at 05/06/11 0500  Gross per 24 hour  Intake 1499.62 ml  Output   2358 ml  Net -858.38 ml   Vent Mode:  [-] PRVC FiO2 (%):  [30 %] 30 % Set Rate:  [16 bmp] 16 bmp Vt Set:  [480 mL] 480 mL PEEP:  [5 cmH20] 5 cmH20 Pressure Support:  [5 cmH20] 5 cmH20 Plateau Pressure:  [12 cmH20-17 cmH20] 16 cmH20  Neuro: Sedated and intubated.No follow commands when sedation decreased Cardiac: HSIR IR afib Pulmonary: Coarse BS diffusely. GI: Soft, NT, ND and +BS. Extremities: 2+ edema and -tenderness with right forearm erythema.  Labs:  Lab 05/06/11 0332 05/05/11  0330 05/04/11 0500  NA 142 143 142  K 4.3 3.8 4.1  CL 106 106 104  CO2 27 29 28   BUN 36* 27* 26*  CREATININE 1.39* 1.20 1.34  GLUCOSE 203* 105* 154*    Lab 05/06/11 0332 05/05/11 0330 05/04/11 0500  HGB 7.7* 7.2* 8.3*  HCT 23.6* 21.8* 25.5*  WBC 9.9 11.6* 14.0*  PLT 278 214 196   ABG    Component Value Date/Time   PHART 7.496* 05/06/2011 0355   PCO2ART 33.5* 05/06/2011 0355   PO2ART 128.0* 05/06/2011 0355   HCO3 25.8* 05/06/2011 0355   TCO2 24.0 05/06/2011 0355   ACIDBASEDEF 2.7* 04/29/2011 1316   O2SAT 98.9 05/06/2011 0355  Dg Chest Port 1 View  05/06/2011  *RADIOLOGY REPORT*  Clinical Data: Ventilator patient.  PORTABLE CHEST - 1 VIEW  Comparison: 05/05/2011 and 05/04/2011.  Findings: 0411 hours.  The patient remains rotated to the left. Feeding tube has been exchanged for a nasogastric tube which projects below the diaphragm.  Endotracheal tube appears unchanged. The heart size and mediastinal contours are stable.  There is stable bibasilar atelectasis.  No pneumothorax or significant pleural effusion is seen.  IMPRESSION: Support system changes as above.  Stable bibasilar atelectasis.  Original Report Authenticated By: Gerrianne Scale, M.D.   Dg Chest Port 1 View  05/05/2011  *RADIOLOGY REPORT*  Clinical Data: Respiratory distress, endotracheal tube  PORTABLE CHEST - 1 VIEW  Comparison: 05/04/2011; 05/03/2011; 04/22/2011  Findings: The patient is again rotated to the left.  Grossly unchanged cardiac silhouette and mediastinal contour post median sternotomy and CABG. Interval removal of the right jugular approach central venous catheter.  Otherwise, stable positioning of support apparatus.  No pneumothorax.  Minimal increase in heterogeneous interstitial opacities within the left upper lung.  Unchanged bilateral infrahilar heterogeneous opacities.  No definite pleural effusion.  Grossly unchanged bones.  IMPRESSION: 1.  Interval removal of right jugular approach central venous catheter  otherwise, stable positioning of remaining support apparatus.  No pneumothorax. 2.  Minimal increase in left upper and mid lung heterogeneous opacities nonspecific but may represent asymmetric pulmonary edema versus atypical infection.  Continued attention on follow-up is recommended.  Original Report Authenticated By: Waynard Reeds, M.D.   Dg Abd Portable 1v  05/04/2011  *RADIOLOGY REPORT*  Clinical Data: Panda tube placement  PORTABLE ABDOMEN - 1 VIEW  Comparison: 04/29/2011  Findings:  Enteric tube tip projects over the expected location of the gastric antrum/duodenal bulb.  Limited visualization of the lower thorax demonstrates the sequela of prior median sternotomy.  Visualized bowel gas pattern is normal.  IMPRESSION: Enteric tube tip projects over the expected location of the gastric antrum/duodenal bulb.  Original Report Authenticated By: Waynard Reeds, M.D.   Assessment & Plan: ETOH abuse/ withdrawal with hematuria, presumed radiation cystitis with febrile transfusion reactions, intubated 4/11 for HCAP/poor mental status now with cellulitis of the right forearm.  Neuro: Toxic/metabolic encephalopathy, no evidence of CVA on CT, on fentanyl and PRN benzos for withdrawal. Plan: EEG. Abnormal and non focal ?TME  Neuro consult if not awake after adequate treatment of infection.  Daily WUA  Minimize sedation, change fentanyl/versed to PRN  Cardiac: Recent surgery, 2D echo from 4/10 with EF 65%, grade 1 diastolic dysfunction and PAP of 53. Afib on amio and bb. Careful with diuresis Plan: Diurese   KVO IVF  Continue NGT flushes  Pulmonary: VDRF due to PNA and acute on chronic diastolic CHF.  In respiratory alkalosis. Plan: Decrease vent rate once on full support  Maintain on PS as tolerated with full vent support at night then SBT in AM.  Daily SBTs wean as tolerated             Check cvp qshift  Will need to speak with wife prior to extubation to determine whether the family wishes for  a trach/peg or not incase of respiratory failure again. meeting for 4/18 10 am planned.  GI: tolerating TF well with normal BS. Plan: Continue TF.  Renal: UOP low with evidence of fluid overload on CXR. Lab Results  Component Value Date   CREATININE 1.39* 05/06/2011   CREATININE 1.20 05/05/2011   CREATININE 1.34 05/04/2011   CREATININE 1.05 01/29/2011  Plan: Replace K aggressively.  Lab 05/06/11 0332 05/05/11 0330 05/04/11 0500  K 4.3 3.8 4.1   IV Lasix 40 q6 x3 doses. 4/15, 4/17  Recheck BMP  KVO IVF             Check cvp, no cvl  ID: PNA and right forearm cellulitis Plan: Continue vanc/zosyn . 4/16 dc vanc  Cultures are all negative xcept bc are pending as of 4/17  Diarrhea -dc Reglan 4/17  Endocrine: ISS.  Disposition: Family meet for 4/18 to discuss extubation with no reintubation. Brett Canales Minor ACNP [facility] Pollack PCCM Pager [phone] till 3 pm If no answer page [phone] 05/06/2011, 8:15 AM  Patient is more awake today  than I have ever seen him.  He is writing and following commands, weaning well.  After discussion with the patient and family, it is clear that the patient would not want to have a tracheostomy.  After extubation today, will make patient a full NCB.  If he is to have difficulty with airway protection then will change to comfort care only.  Hold additional lasix and will recheck labs in AM.  CC time 45 min.  Patient seen and examined, agree with above note.  I dictated the care and orders written for this patient under my direction.  Koren Bound, M.D. [phone]

## 2011-05-06 NOTE — Progress Notes (Signed)
Nutrition Follow-up  Diet Order:  Jevity 1.2 @ goal rate of 75 ml/hr  Jevity 1.2 @ 75 ml/hr provides 2016 kcal, 93 g protein, 1458 ml free water, 200 ml free water QID (meeting estimated energy needs).  RN stated patient with positive tolerance of TF. Patient with 3 bowel movements yesterday. TF currently held for extubation.    Meds: Scheduled Meds:   . acetaminophen  650 mg Oral Once  . amiodarone  200 mg Oral BID  . antiseptic oral rinse  15 mL Mouth Rinse BID  . chlorhexidine  15 mL Mouth/Throat QID  . clotrimazole   Topical BID  . folic acid  1 mg Intravenous Daily  . free water  300 mL Per Tube Q6H  . furosemide  20 mg Intravenous Q6H  . haloperidol lactate  5 mg Intravenous Once  . insulin aspart  0-15 Units Subcutaneous Q4H  . ipratropium  0.5 mg Nebulization Q6H  . levalbuterol  0.63 mg Nebulization Q6H  . pantoprazole sodium  40 mg Per Tube Q1200  . piperacillin-tazobactam (ZOSYN)  IV  3.375 g Intravenous Q8H  . potassium chloride  40 mEq Per Tube TID  . sodium chloride  3 mL Intravenous Q12H  . thiamine  100 mg Intravenous Daily  . DISCONTD: metoprolol tartrate  25 mg Per Tube BID   Continuous Infusions:   . sodium chloride 20 mL/hr at 05/06/11 0419  . dexmedetomidine (PRECEDEX) IV infusion for high rates 0.3 mcg/kg/hr (05/06/11 1000)  . feeding supplement (JEVITY 1.2 CAL) 1,000 mL (05/05/11 2132)   PRN Meds:.acetaminophen (TYLENOL) oral liquid 160 mg/5 mL, diphenhydrAMINE, fentaNYL, metoprolol  Labs:  CMP     Component Value Date/Time   NA 142 05/06/2011 0332   K 4.3 05/06/2011 0332   CL 106 05/06/2011 0332   CO2 27 05/06/2011 0332   GLUCOSE 203* 05/06/2011 0332   BUN 36* 05/06/2011 0332   CREATININE 1.39* 05/06/2011 0332   CREATININE 1.05 01/29/2011 1708   CALCIUM 8.2* 05/06/2011 0332   PROT 6.9 04/28/2011 0335   ALBUMIN 2.5* 04/28/2011 0335   AST 27 04/28/2011 0335   ALT 13 04/28/2011 0335   ALKPHOS 95 04/28/2011 0335   BILITOT 0.5 04/28/2011 0335   GFRNONAA 45*  05/06/2011 0332   GFRAA 52* 05/06/2011 0332     Intake/Output Summary (Last 24 hours) at 05/06/11 1048 Last data filed at 05/06/11 1000  Gross per 24 hour  Intake 1558.71 ml  Output   2498 ml  Net -939.29 ml    Weight Status:  165 lb. Weight stable  Estimated needs:       2082-2290 kcal     100-115 g protein     2 L fluid  Nutrition Dx: Inadequate oral intake, ongoing  Goal: 1.Patient will meet 100% estimated needs, meeting 2. Patient to have positive tolerance of TF, meeting. 3. Advance diet as medically able to oral nutrition.   Intervention:  Continue Jevity 1.2 at 75 ml/hr. Hold TF if residuals >400 ml   Monitor:  Swallowing evaluation after extubation, TF tolerance, weight trends, labs, I/O's RD to follow for nutrition plan of care.    Iven Finn [facility] Pager #:  [phone]

## 2011-05-06 NOTE — Progress Notes (Signed)
CARE MANAGEMENT NOTE 05/06/2011  Patient:  CREE, KUNERT   Account Number:  [id number]  Date Initiated:  04/28/2011  Documentation initiated by:  Raiford Noble  Subjective/Objective Assessment:   pt adm with hematuria, anemia; hx of etoh, prostate cancer     Action/Plan:   from home w/ wife-- may need snf at dc- will await pt recs   Anticipated DC Date:  05/09/2011   Anticipated DC Plan:  SKILLED NURSING FACILITY  In-house referral  Clinical Social Worker      DC Planning Services  CM consult      [facility] Choice  NA   Choice offered to / List presented to:  NA   DME arranged  NA      DME agency  NA     HH arranged  NA      HH agency  NA   Status of service:  In process, will continue to follow Medicare Important Message given?  NA - LOS <3 / Initial given by admissions (If response is "NO", the following Medicare IM given date fields will be blank) Date Medicare IM given:   Date Additional Medicare IM given:    Discharge Disposition:    Per UR Regulation:  Reviewed for med. necessity/level of care/duration of stay  If discussed at Long Length of Stay Meetings, dates discussed:   05/05/2011    Comments:  [id number]/Patinet remains on the vent on full respiratory support/ PCCM to meet with family [phone] to discuss extubation of patient without reintubation.  EEG on [id number] did show encephalopathy and general abnormal study. [id number] Lorrin Mais Case Management [phone]  [id number] Candace Ramus,RN,BSN,CCM [phone] Patient remains on the vent is calmer due to sedation. 04-29-11 Raiford Noble, RN,BSN,CM [phone] Pt remains in restraints, agitated, confused. Now intubated on vent. Will follow. Likely will need snf at dc if stable.   04-28-11 Raiford Noble, RN,BSN,CM [phone] Not stable for dc at this time. May need SNF at dc. Will cont to follow.

## 2011-05-07 ENCOUNTER — Inpatient Hospital Stay (HOSPITAL_COMMUNITY): Payer: Medicare Other

## 2011-05-07 LAB — CBC
HCT: 25.9 % — ABNORMAL LOW (ref 39.0–52.0)
MCHC: 32.4 g/dL (ref 30.0–36.0)
MCV: 90.9 fL (ref 78.0–100.0)
RDW: 14.7 % (ref 11.5–15.5)

## 2011-05-07 LAB — BASIC METABOLIC PANEL
BUN: 30 mg/dL — ABNORMAL HIGH (ref 6–23)
Calcium: 8.7 mg/dL (ref 8.4–10.5)
Chloride: 109 mEq/L (ref 96–112)
Creatinine, Ser: 1.13 mg/dL (ref 0.50–1.35)
GFR calc Af Amer: 67 mL/min — ABNORMAL LOW (ref >90)

## 2011-05-07 LAB — GLUCOSE, CAPILLARY
Glucose-Capillary: 112 mg/dL — ABNORMAL HIGH (ref 70–99)
Glucose-Capillary: 92 mg/dL (ref 70–99)
Glucose-Capillary: 123 mg/dL — ABNORMAL HIGH (ref 70–99)

## 2011-05-07 LAB — MAGNESIUM: Magnesium: 2.3 mg/dL (ref 1.5–2.5)

## 2011-05-07 MED ORDER — HALOPERIDOL LACTATE 5 MG/ML IJ SOLN
5.0000 mg | Freq: Once | INTRAMUSCULAR | Status: AC
  Administered 2011-05-07: 5 mg via INTRAVENOUS
  Filled 2011-05-07: qty 1

## 2011-05-07 MED ORDER — HALOPERIDOL LACTATE 5 MG/ML IJ SOLN: 10.0000 mg | INTRAMUSCULAR | Status: DC | PRN

## 2011-05-07 MED ORDER — FOOD THICKENER (THICKENUP CLEAR)
ORAL | Status: DC | PRN
  Filled 2011-05-07: qty 120

## 2011-05-07 MED ORDER — POTASSIUM CHLORIDE 10 MEQ/100ML IV SOLN
10.0000 meq | INTRAVENOUS | Status: AC
  Administered 2011-05-07 (×4): 10 meq via INTRAVENOUS
  Filled 2011-05-07 (×6): qty 100

## 2011-05-07 NOTE — Progress Notes (Signed)
Patient transferred via wheel chair with his personal belongings to 1421 at 1120. Receiving nurse called and given report. Patient stable, wife accompanied patient for the transfer.

## 2011-05-07 NOTE — Progress Notes (Signed)
eLink Physician-Brief Progress Note Patient Name: [name] DOB: 02-27-1926 MRN: [id number]  Date of Service  05/07/2011   HPI/Events of Note  Call from nurse reporting agitation/confusion in patient recently transferred from ICU.  Refusing medications and attempting to get OOB - high fall risk   eICU Interventions  Plan: Haldol with titrated dose Posey belt 12 lead EKG in AM to evaluate QTc   Intervention Category Intermediate Interventions:  (Agitation/Confusion)  Lajune Perine 05/07/2011, 11:27 PM

## 2011-05-07 NOTE — Progress Notes (Signed)
HPI:  59 yowm with prostate cancer, active ETOH use, CVA, CAD, HTN, PVD, AS post valve replacement. Adm 4/7 with gross hematuria secondary to radiation cystitis. Critical care consult called by Dr. Ashley Royalty on 4/7 for evaluation of high grade fevers, chills , tachypnea during transfusion , recurred 4/8 >> attributed to minor non hemolytic reaction, tolerated transfusion 4/9 with pre-medication.  Course complicated by ETOH withdrawal, e.coli UTI & RVR  4/10 PCCM signed off.  4/11 PCCM reconsulted for agitation.  HCAP RLL on CXR, resp failure acute, intubated and tfr to PCCM .  Antibiotics:   ceftx 4/7 >>4/11  4/11 Vanco (HCAP)> 4/14>>>4/15>>>4/16 dc 4/11 zoysn(HCAP)>>  Cultures/Sepsis Markers:   4/7 urine >> e. Coli S to ceftx  4/11 resp c/s > rare wbc, no org seen >>> nl flora 4/11 blood x2 >>  Access/Protocols:  ETT 4/11>>>4/18 CVL L IJ 4/11>>>4/15 4/15 rt i j cvl>>4/16 pt pulled out  Best Practice: DVT: SCD's GI: Protonix  Subjective:Now extubated , sitting in chair  Physical Exam: Temp:  [97.4 F (36.3 C)-98.4 F (36.9 C)] 97.4 F (36.3 C) (04/19 0800) Pulse Rate:  [57-97] 72  (04/19 0200) Resp:  [16-28] 18  (04/19 0200) BP: (105-141)/(39-84) 109/84 mmHg (04/19 0100) SpO2:  [98 %-100 %] 100 % (04/19 0200) FiO2 (%):  [30 %] 30 % (04/18 1100)  Intake/Output Summary (Last 24 hours) at 05/07/11 0913 Last data filed at 05/07/11 0500  Gross per 24 hour  Intake  318.1 ml  Output   1200 ml  Net -881.9 ml   Vent Mode:  [-]  FiO2 (%):  [30 %] 30 %  Neuro: Confused  Cardiac: HSIR IR afib Pulmonary: Coarse BS diffusely. GI: Soft, NT, ND and +BS. Extremities: 2+ edema and -tenderness with right forearm erythema.  Labs:  Lab 05/07/11 0330 05/06/11 0332 05/05/11 0330  NA 145 142 143  K 3.3* 4.3 3.8  CL 109 106 106  CO2 24 27 29   BUN 30* 36* 27*  CREATININE 1.13 1.39* 1.20  GLUCOSE 126* 203* 105*    Lab 05/07/11 0330 05/06/11 0332 05/05/11 0330  HGB 8.4* 7.7* 7.2*   HCT 25.9* 23.6* 21.8*  WBC 9.6 9.9 11.6*  PLT 357 278 214   ABG    Component Value Date/Time   PHART 7.496* 05/06/2011 0355   PCO2ART 33.5* 05/06/2011 0355   PO2ART 128.0* 05/06/2011 0355   HCO3 25.8* 05/06/2011 0355   TCO2 24.0 05/06/2011 0355   ACIDBASEDEF 2.7* 04/29/2011 1316   O2SAT 98.9 05/06/2011 0355  Dg Chest Port 1 View  05/07/2011  *RADIOLOGY REPORT*  Clinical Data: Hypertension.  Endotracheal tube.  PORTABLE CHEST - 1 VIEW  Comparison: Portable chest 05/06/2011.  Findings: The patient has been extubated.  The NG tube was removed. Aeration is improved.  Emphysematous changes are noted.  Scarring or linear atelectasis in the left lung is again noted.  Remote right-sided rib fractures are evident.  IMPRESSION:  1.  Status post extubation and removal of the NG tube. 2.  Continued improvement in aeration with some residual scarring or linear atelectasis.  Original Report Authenticated By: Jamesetta Orleans. MATTERN, M.D.   Dg Chest Port 1 View  05/06/2011  *RADIOLOGY REPORT*  Clinical Data: Ventilator patient.  PORTABLE CHEST - 1 VIEW  Comparison: 05/05/2011 and 05/04/2011.  Findings: 0411 hours.  The patient remains rotated to the left. Feeding tube has been exchanged for a nasogastric tube which projects below the diaphragm.  Endotracheal tube appears unchanged. The heart size  and mediastinal contours are stable.  There is stable bibasilar atelectasis.  No pneumothorax or significant pleural effusion is seen.  IMPRESSION: Support system changes as above.  Stable bibasilar atelectasis.  Original Report Authenticated By: Gerrianne Scale, M.D.   Assessment & Plan: ETOH abuse/ withdrawal with hematuria, presumed radiation cystitis with febrile transfusion reactions, intubated 4/11 for HCAP/poor mental status now with cellulitis of the right forearm.  Neuro: Toxic/metabolic encephalopathy, no evidence of CVA on CT, on fentanyl and PRN benzos for withdrawal. Plan: EEG. Abnormal and non focal  ?TME  Neuro consult if not awake after adequate treatment of infection.  4/19 confused but follows commands  Cardiac: Recent surgery, 2D echo from 4/10 with EF 65%, grade 1 diastolic dysfunction and PAP of 53. Afib on amio and bb. Careful with diuresis Plan: Diurese   KVO IVF   Pulmonary: VDRF due to PNA and acute on chronic diastolic CHF.  In respiratory alkalosis. Plan: Decrease vent rate once on full support  Maintain on PS as tolerated with full vent support at night then SBT in AM.  Daily SBTs wean as tolerated             Check cvp qshift  Extubated as dnr 4/18 following family discussion.   Plan: feed  Renal: UOP low with evidence of fluid overload on CXR. Lab Results  Component Value Date   CREATININE 1.13 05/07/2011   CREATININE 1.39* 05/06/2011   CREATININE 1.20 05/05/2011   CREATININE 1.05 01/29/2011  Plan: Replace K aggressively.  Lab 05/07/11 0330 05/06/11 0332 05/05/11 0330  K 3.3* 4.3 3.8   IV Lasix 40 q6 x3 doses. 4/15, 4/17  Recheck BMP  KVO IVF             Check cvp, no cvl  ID: PNA and right forearm cellulitis Plan: Continue vanc/zosyn . 4/16 dc vanc  Cultures are all negative xcept bc are pending as of 4/17  Diarrhea -dc Reglan 4/17  Endocrine: ISS.  Disposition: Family meet for 4/18 to discuss extubation with no reintubation. 4/19 tx to tele and back to triad service Brett Canales Minor ACNP Ad[facility] Pollack PCCM Pager [phone] till 3 pm If no answer page [phone] 05/07/2011, 9:13 AM  Patient seen and examined, agree with above note.  I dictated the care and orders written for this patient under my direction.  Koren Bound, M.D. [phone]

## 2011-05-07 NOTE — Evaluation (Signed)
Physical Therapy Evaluation Patient Details Name: [name] MRN: [id number] DOB: [birth year] Today's Date: 05/07/2011 Time: 6213-0865 PT Time Calculation (min): 16 min  PT Assessment / Plan / Recommendation Clinical Impression  Pt admitted for hematuria.  Pt would benefit from acute PT services in order to improve independence and safety with transfers and ambulation and improve balance to prepare for d/c to SNF.    PT Assessment  Patient needs continued PT services    Follow Up Recommendations  Skilled nursing facility;Supervision/Assistance - 24 hour    Equipment Recommendations  Defer to next venue    Frequency Min 3X/week    Precautions / Restrictions Precautions Precautions: Fall Restrictions Weight Bearing Restrictions: No         Mobility  Bed Mobility Bed Mobility: Not assessed Transfers Transfers: Sit to Stand;Stand to Sit Sit to Stand: With upper extremity assist;With armrests;From chair/3-in-1;4: Min assist Stand to Sit: With armrests;To chair/3-in-1;4: Min assist Details for Transfer Assistance: assist to steady upon rising and to control descent, verbal cues for hand placement Ambulation/Gait Ambulation/Gait Assistance: 3: Mod assist Ambulation Distance (Feet): 80 Feet Assistive device: Rolling walker Ambulation/Gait Assistance Details: assist for LOB with head turns and stopping, pt does better with balance at faster pace, cues for wider BOS Gait Pattern: Step-through pattern;Decreased stride length;Scissoring;Narrow base of support    Exercises     PT Goals Acute Rehab PT Goals PT Goal Formulation: With patient Time For Goal Achievement: 05/21/11 Potential to Achieve Goals: Good Pt will go Sit to Stand: with supervision PT Goal: Sit to Stand - Progress: Goal set today Pt will go Stand to Sit: with supervision PT Goal: Stand to Sit - Progress: Goal set today Pt will Ambulate: >150 feet;with supervision;with least restrictive assistive device PT  Goal: Ambulate - Progress: Goal set today Pt will Perform Home Exercise Program: with supervision, verbal cues required/provided PT Goal: Perform Home Exercise Program - Progress: Goal set today Additional Goals Additional Goal #1: Pt will demonstrate no LOB with head turns and stopping on cue during ambulation for improving balance. PT Goal: Additional Goal #1 - Progress: Goal set today  Visit Information  Last PT Received On: 05/07/11 Assistance Needed: +1    Subjective Data  Subjective: "Look at that.  (catheter bag with reddish urine)  That'd clear up with a couple beers."   Prior Functioning  Home Living Lives With: Spouse Type of Home: House Home Access: Stairs to enter Secretary/administrator of Steps: 3 Home Layout: One level Home Adaptive Equipment: None Prior Function Level of Independence: Independent Vocation: Retired Musician: No difficulties    Cognition  Overall Cognitive Status: Appears within functional limits for tasks assessed/performed Arousal/Alertness: Awake/alert Orientation Level: Appears intact for tasks assessed Behavior During Session: [facility] for tasks performed    Extremity/Trunk Assessment Right Upper Extremity Assessment RUE ROM/Strength/Tone: [facility] for tasks assessed Left Upper Extremity Assessment LUE ROM/Strength/Tone: [facility] for tasks assessed Right Lower Extremity Assessment RLE ROM/Strength/Tone: [facility] for tasks assessed Left Lower Extremity Assessment LLE ROM/Strength/Tone: [facility] for tasks assessed   Balance High Level Balance High Level Balance Activites: Turns;Sudden stops;Head turns High Level Balance Comments: pt requires at least mod assist for balance during ambulation with challenges above  End of Session PT - End of Session Equipment Utilized During Treatment: Oxygen Activity Tolerance: Patient tolerated treatment well Patient left: in chair;with call bell/phone within reach;with family/visitor present    Yesena Reaves,KATHrine E 05/07/2011, 1:07 PM Pager: [phone]

## 2011-05-07 NOTE — Progress Notes (Signed)
CSW following for support and SNF placement. Wife agrees to SNF and CSW has begun paperwork. Pt has existing [place] number. FL2 will need updates once PT sees Pt. CSW will continue to follow for SNF.  Vennie Homans, [place] 05/07/2011 11:37 AM [phone]

## 2011-05-07 NOTE — Progress Notes (Signed)
Patient is refusing amiodarone tonight, stating that he must speak with his doctor tomorrow before he will take medication. Explained to patient importance of medication and that it was his doctor that placed him on it and wishes him to take it. Patient still refused stating that he will not take the medication and has been doing just fine. Of note patient's BP was elevated tonight. When I mentioned to the patient that we may need to treat his elevated BP he refused. Patient mentioned he was nervous and having tremors, which I did take note of. I discussed with the patient perhaps the need for ativan since he could be withdrawing. Patient also refused medication to assist with ETOH abuse. Patient very confused tonight as well. Will not stay in the chair or the bed. Very high fall risk. Patient's gait very unsteady. Will page and inform MD.

## 2011-05-07 NOTE — Progress Notes (Signed)
PROGRESS NOTE  Subjective:    [name] is a 78 y.o. male with a history of OHS/AoVR with CABG September 2012. 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 He had anemia requiring transfusions, then had a transfusion reaction which was treated. He had confusion and agitation, requiring sedation, and was also going through ETOH withdrawal.  He was seen by hematology as well. He was noted to have worsening pulmonary edema on CXR and cardiology was asked to evaluate him. The patient has received sedation. His wife is present. He is not able to answer questions and per the nursing staff, is very agitated unless sedated. His wife states he has not complained of chest pain recently but did get her to feel his heart because he stated it felt different, but did not say why.  He has been having some episodes of NSVT earlier this week.  Had A-Fib last night and converted to NSR this am. Currently in NSR.  He has been extubated.  Awake alert and asking questions. Overall doing much better.   Awake and alter.  Wants to have ETT out. Objective:    Vital Signs:   Temp:  [97.6 F (36.4 C)-98.9 F (37.2 C)] 97.6 F (36.4 C) (04/19 0000) Pulse Rate:  [55-97] 72  (04/19 0200) Resp:  [16-30] 18  (04/19 0200) BP: (97-141)/(39-84) 109/84 mmHg (04/19 0100) SpO2:  [98 %-100 %] 100 % (04/19 0200) FiO2 (%):  [30 %] 30 % (04/18 1100)  Last BM Date: 05/06/11   24-hour weight change: Weight change:   Weight trends: Filed Weights   05/02/11 0000 05/03/11 0500 05/04/11 0500  Weight: 164 lb 10.9 oz (74.7 kg) 171 lb 4.8 oz (77.7 kg) 165 lb 12.6 oz (75.2 kg)    Intake/Output:  04/18 0701 - 04/19 0700 In: 530.7 [I.V.:258.2; NG/GT:150; IV Piggyback:122.5] Out: 1400 [Urine:1400]     Physical Exam: BP 109/84  Pulse 72  Temp(Src) 97.6 F (36.4 C) (Oral)  Resp 18  Ht 5\' 11"  (1.803 m)  Wt 165 lb 12.6 oz (75.2 kg)  BMI  23.12 kg/m2  SpO2 100%  General: Vital signs reviewed and noted. Awake, alert,  thin  Head: Normocephalic, atraumatic.  Eyes: conjunctivae/corneas clear. PERRL, EOM's intact. Fundi benign.  Throat: Oropharynx nonerythematous, no exudate appreciated.   Neck: Supple. Normal carotids. No JVD  Lungs:  On vent. Lungs sound a bit better this am  Heart: RR. 2/6 systolic murmur,  Abdomen:  Soft, non-tender, non-distended with normoactive bowel sounds. No hepatomegaly. No rebound/guarding. No abdominal masses.  Extremities: No edema.  Distal pedal pulses are 2+ and equal bilaterally.  Neurologic: Normal, nonfocal  Psych:     Labs: BMET:  Basename 05/07/11 0330 05/06/11 0332  NA 145 142  K 3.3* 4.3  CL 109 106  CO2 24 27  GLUCOSE 126* 203*  BUN 30* 36*  CREATININE 1.13 1.39*  CALCIUM 8.7 8.2*  MG 2.3 2.2  PHOS 3.5 3.6    Liver function tests: No results found for this basename: AST:2,ALT:2,ALKPHOS:2,BILITOT:2,PROT:2,ALBUMIN:2 in the last 72 hours No results found for this basename: LIPASE:2,AMYLASE:2 in the last 72 hours  CBC:  Basename 05/07/11 0330 05/06/11 0332  WBC 9.6 9.9  NEUTROABS -- --  HGB 8.4* 7.7*  HCT 25.9* 23.6*  MCV 90.9 90.4  PLT 357 278    Cardiac Enzymes: No results found for this basename: CKTOTAL:4,CKMB:4,TROPONINI:4 in the last 72 hours  Coagulation Studies:  No results found for this basename: LABPROT:5,INR:5 in the last 72 hours  Other:  Tele: sinus brady  Medications:    Infusions:  Scheduled Medications:    . acetaminophen  650 mg Oral Once  . amiodarone  200 mg Oral BID  . antiseptic oral rinse  15 mL Mouth Rinse BID  . chlorhexidine  15 mL Mouth/Throat QID  . clotrimazole   Topical BID  . folic acid  1 mg Intravenous Daily  . insulin aspart  0-15 Units Subcutaneous Q4H  . ipratropium  0.5 mg Nebulization Q6H  . levalbuterol  0.63 mg Nebulization Q6H  . pantoprazole (PROTONIX) IV  40 mg Intravenous Q24H  . piperacillin-tazobactam  (ZOSYN)  IV  3.375 g Intravenous Q8H  . sodium chloride  3 mL Intravenous Q12H  . thiamine  100 mg Intravenous Daily  . DISCONTD: free water  300 mL Per Tube Q6H  . DISCONTD: metoprolol tartrate  25 mg Per Tube BID  . DISCONTD: pantoprazole sodium  40 mg Per Tube Q1200    Assessment/ Plan:    1. A-Fib: continue amiodarone. He has converted to sinus rhythm.  Will continue Amiodarone 200 bid for a week or so and then decrease the dose to 200 mg daily.    2. NSVT:  LVEF is normal by echo 60-65%.   Keep K above 4.0.  He is bradycardic at baseline.    He should follow up with Dr. Antoine Poche as OP.   Disposition:  Length of Stay: 12  Vesta Mixer, Montez Hageman., MD, [facility] 05/07/2011, 7:06 AM

## 2011-05-07 NOTE — Evaluation (Signed)
Clinical/Bedside Swallow Evaluation Patient Details  Name: [name] MRN: [id number] DOB: [birth year] Today's Date: 05/07/2011  Past Medical History:  Past Medical History  Diagnosis Date  . ADENOCARCINOMA, PROSTATE   . ALLERGIC RHINITIS   . B12 DEFICIENCY   . BENIGN PROSTATIC HYPERTROPHY   . CAROTID ARTERY DISEASE   . CEREBROVASCULAR ACCIDENT, HX OF   . CORONARY ARTERY DISEASE   . DIVERTICULOSIS, COLON   . GERD   . HEMORRHOIDS   . HIATAL HERNIA   . HYPERLIPIDEMIA   . HYPERTENSION   . OSTEOPENIA   . PEPTIC ULCER DISEASE   . PERIPHERAL VASCULAR DISEASE   . Personal history of alcoholism   . PROSTATE CANCER, HX OF   . PUD, HX OF   . TRANSIENT ISCHEMIC ATTACK   . Shingles   . Anemia   . AS (aortic stenosis)   . S/P AVR (aortic valve replacement) 09/23/2010  . Complication of anesthesia 09/23/2010    very confused after waking up. Stopped drinking 40 days before this surgery.  . Transfusion reaction, chill fever type 04/28/2011    Fever, rigors, tachycardia,tachypnea but no evidence of hemolysis  . Hemorrhagic cystitis 04/28/2011    Due to previous radiation for PSA recurrence of prostate cancer   Past Surgical History:  Past Surgical History  Procedure Date  . Popliteal synovial cyst excision   . Right carotid endarterectomy 1999    Left carotid total chronic occlusion  . Prostate surgery   . Cardic stent 2004  . Aortic valve replacement 09/23/2010    #17mm [facility] Ease pericardial tissue valve  . Coronary artery bypass graft 09/23/2010    CABG x1 using LIMA to LAD  . Cystoscopy with biopsy 02/15/2011    Procedure: CYSTOSCOPY WITH BIOPSY;  Surgeon: Anner Crete, MD;  Location: [facility]S;  Service: Urology;  Laterality: N/A;  Cystoscopy, biopsy and fulguration of the bladder   HPI:  HPI:  74 yowm with prostate cancer, active ETOH use, CVA, CAD, HTN, PVD, AS post valve replacement. Adm 4/7 with gross hematuria secondary to radiation cystitis. Critical care consult  called by Dr. Ashley Royalty on 4/7 for evaluation of high grade fevers, chills , tachypnea during transfusion , recurred 4/8 >> attributed to minor non hemolytic reaction, tolerated transfusion 4/9 with pre-medication.     Assessment/Recommendations/Treatment Plan    SLP Assessment Clinical Impression Statement: Patient exhibits s/s of aspiration with thin liquids, (strong immediate cough intermittently).  Patient was impulsive, grabbing the cup and taking large consecutive swallows.  With max cues, he was able to take small sips, one sip at a time, but still coughed as if aspirating, after a small sip.  Patient most likely has an acute reversible dysphagia s/p oral intubation X 7 days.  It is expected that this will resolve over the next 1-2 days. Risk for Aspiration: Moderate Other Related Risk Factors: History of pneumonia;History of GERD;History of esophageal-related issues;Cognitive impairment  Swallow Evaluation Recommendations Diet Recommendations: Dysphagia 3 (Mechanical Soft);Nectar-thick liquid Liquid Administration via: Cup;No straw Medication Administration: Whole meds with puree Supervision: Full supervision/cueing for compensatory strategies;Patient able to self feed Compensations: Slow rate;Small sips/bites Postural Changes and/or Swallow Maneuvers: Seated upright 90 degrees;Upright 30-60 min after meal Oral Care Recommendations: Oral care QID;Staff/trained caregiver to provide oral care Other Recommendations: Order thickener from pharmacy;Clarify dietary restrictions Follow up Recommendations: 24 hour supervision/assistance  Treatment Plan Treatment Plan Recommendations: Other (Comment);Therapy as outlined in treatment plan below Speech Therapy Frequency: min 1 x/week Treatment Duration:  2 weeks Interventions: Aspiration precaution training;Patient/family education;Trials of upgraded texture/liquids  Prognosis Prognosis for Safe Diet Advancement: Good Barriers to Reach Goals:  Cognitive deficits  Individuals Consulted Consulted and Agree with Results and Recommendations: Patient;Family member/caregiver;RN  Swallowing Goals  SLP Swallowing Goals Patient will consume recommended diet without observed clinical signs of aspiration with: Supervision/safety;Set-up;Minimal cueing  Swallow Study Prior Functional Status  Cognitive/Linguistic Baseline: Within functional limits Type of Home: House Lives With: Spouse Vocation: Retired  Radio producer  HPI: HPI:  36 yowm with prostate cancer, active ETOH use, CVA, CAD, HTN, PVD, AS post valve replacement. Adm 4/7 with gross hematuria secondary to radiation cystitis. Critical care consult called by Dr. Ashley Royalty on 4/7 for evaluation of high grade fevers, chills , tachypnea during transfusion , recurred 4/8 >> attributed to minor non hemolytic reaction, tolerated transfusion 4/9 with pre-medication.   Type of Study: Bedside swallow evaluation Diet Prior to this Study: Regular;NPO;Other (Comment) ("I drink a lot of beer.") Temperature Spikes Noted: No Respiratory Status: Supplemental O2 delivered via (comment) History of Intubation: Yes Length of Intubations (days): 7 days Date extubated: 05/06/11 Behavior/Cognition: Alert;Cooperative;Pleasant mood;Requires cueing Oral Cavity - Dentition: Dentures, top;Dentures, bottom Vision: Functional for self-feeding Patient Positioning: Upright in chair Baseline Vocal Quality: Clear Volitional Cough: Strong Volitional Swallow: Able to elicit  Oral Motor/Sensory Function  Overall Oral Motor/Sensory Function: Appears within functional limits for tasks assessed  Consistency Results     Thin Liquid Thin Liquid: Impaired Presentation: Cup Pharyngeal  Phase Impairments: Cough - Immediate;Throat Clearing - Immediate  Nectar Thick Liquid Nectar Thick Liquid: Within functional limits Presentation: Cup  Honey Thick Liquid Honey Thick Liquid: Not tested  Puree Puree: Within  functional limits Presentation: Self Fed  Solid Solid: Within functional limits Presentation: Self [name], [name] 05/07/2011,10:34 AM

## 2011-05-08 LAB — GLUCOSE, CAPILLARY
Glucose-Capillary: 114 mg/dL — ABNORMAL HIGH (ref 70–99)
Glucose-Capillary: 129 mg/dL — ABNORMAL HIGH (ref 70–99)

## 2011-05-08 LAB — BASIC METABOLIC PANEL
BUN: 24 mg/dL — ABNORMAL HIGH (ref 6–23)
Calcium: 8.4 mg/dL (ref 8.4–10.5)
Creatinine, Ser: 1.17 mg/dL (ref 0.50–1.35)
GFR calc Af Amer: 65 mL/min — ABNORMAL LOW (ref >90)
GFR calc non Af Amer: 56 mL/min — ABNORMAL LOW (ref >90)
Potassium: 3.2 mEq/L — ABNORMAL LOW (ref 3.5–5.1)

## 2011-05-08 LAB — CBC
HCT: 24.6 % — ABNORMAL LOW (ref 39.0–52.0)
MCH: 29.2 pg (ref 26.0–34.0)
MCHC: 32.1 g/dL (ref 30.0–36.0)
MCV: 90.8 fL (ref 78.0–100.0)
RDW: 14.7 % (ref 11.5–15.5)

## 2011-05-08 MED ORDER — LEVALBUTEROL HCL 0.63 MG/3ML IN NEBU
0.6300 mg | INHALATION_SOLUTION | Freq: Two times a day (BID) | RESPIRATORY_TRACT | Status: DC
  Administered 2011-05-08 – 2011-05-11 (×5): 0.63 mg via RESPIRATORY_TRACT
  Filled 2011-05-08 (×7): qty 3

## 2011-05-08 MED ORDER — HALOPERIDOL LACTATE 5 MG/ML IJ SOLN: 5.0000 mg | Freq: Four times a day (QID) | INTRAMUSCULAR | Status: DC | PRN

## 2011-05-08 MED ORDER — HALOPERIDOL LACTATE 5 MG/ML IJ SOLN
10.0000 mg | INTRAMUSCULAR | Status: DC | PRN
  Administered 2011-05-08 (×2): 10 mg via INTRAVENOUS
  Filled 2011-05-08: qty 2

## 2011-05-08 MED ORDER — IPRATROPIUM BROMIDE 0.02 % IN SOLN
0.5000 mg | Freq: Two times a day (BID) | RESPIRATORY_TRACT | Status: DC
  Administered 2011-05-08 – 2011-05-11 (×5): 0.5 mg via RESPIRATORY_TRACT
  Filled 2011-05-08 (×5): qty 2.5

## 2011-05-08 MED ORDER — HALOPERIDOL LACTATE 5 MG/ML IJ SOLN
INTRAMUSCULAR | Status: AC
  Filled 2011-05-08: qty 2

## 2011-05-08 MED ORDER — POTASSIUM CHLORIDE 10 MEQ/100ML IV SOLN
10.0000 meq | INTRAVENOUS | Status: AC
  Administered 2011-05-08 (×3): 10 meq via INTRAVENOUS
  Filled 2011-05-08 (×4): qty 100

## 2011-05-08 NOTE — Progress Notes (Addendum)
Subjective:  The patient remains confused and disoriented. Required restraints during the night.  Denies any chest pain or dyspnea.  Objective:  Vital Signs in the last 24 hours: Temp:  [97.2 F (36.2 C)-98.6 F (37 C)] 97.5 F (36.4 C) (04/20 0617) Pulse Rate:  [80-99] 86  (04/20 0617) Resp:  [18-21] 18  (04/20 0617) BP: (129-168)/(59-90) 156/74 mmHg (04/20 0617) SpO2:  [95 %-100 %] 96 % (04/20 0617) Weight:  [73.573 kg (162 lb 3.2 oz)] 73.573 kg (162 lb 3.2 oz) (04/20 0617)  Intake/Output from previous day: 04/19 0701 - 04/20 0700 In: 550 [I.V.:100; IV Piggyback:450] Out: 1000 [Urine:1000] Intake/Output from this shift: Total I/O In: 60 [P.O.:60] Out: -      . acetaminophen  650 mg Oral Once  . amiodarone  200 mg Oral BID  . antiseptic oral rinse  15 mL Mouth Rinse BID  . chlorhexidine  15 mL Mouth/Throat QID  . clotrimazole   Topical BID  . folic acid  1 mg Intravenous Daily  . haloperidol lactate      . haloperidol lactate  5 mg Intravenous Once  . insulin aspart  0-15 Units Subcutaneous Q4H  . ipratropium  0.5 mg Nebulization Q6H  . levalbuterol  0.63 mg Nebulization Q6H  . pantoprazole (PROTONIX) IV  40 mg Intravenous Q24H  . piperacillin-tazobactam (ZOSYN)  IV  3.375 g Intravenous Q8H  . potassium chloride  10 mEq Intravenous Q1 Hr x 4  . sodium chloride  3 mL Intravenous Q12H  . thiamine  100 mg Intravenous Daily      . sodium chloride 10 mL/hr at 05/06/11 2000    Physical Exam: The patient appears to be in mild distress seondary to fighting against wrist restraints  Head and neck exam reveals that the pupils are equal and reactive.  The extraocular movements are full.  There is no scleral icterus.  Mouth and pharynx are benign.  No lymphadenopathy.  No carotid bruits.  The jugular venous pressure is normal.  Thyroid is not enlarged or tender.  Chest is clear to percussion and auscultation.  No rales or rhonchi.  Expansion of the chest is  symmetrical.  Heart reveals no abnormal lift or heave.  First and second heart sounds are normal.  There is no gallop rub or click. Grade 2/6 aortic systolic murmur across prosthetic valve  The abdomen is soft and nontender.  Bowel sounds are normoactive.  There is no hepatosplenomegaly or mass.  There are no abdominal bruits.  Extremities reveal no phlebitis or edema.  Pedal pulses are good.  There is no cyanosis or clubbing.  Neurologic exam is normal strength and no lateralizing weakness.  No sensory deficits.  Integument reveals no rash  Lab Results:  Basename 05/08/11 0451 05/07/11 0330  WBC 8.9 9.6  HGB 7.9* 8.4*  PLT 424* 357    Basename 05/08/11 0451 05/07/11 0330  NA 144 145  K 3.2* 3.3*  CL 110 109  CO2 23 24  GLUCOSE 121* 126*  BUN 24* 30*  CREATININE 1.17 1.13   No results found for this basename: TROPONINI:2,CK,MB:2 in the last 72 hours Hepatic Function Panel No results found for this basename: PROT,ALBUMIN,AST,ALT,ALKPHOS,BILITOT,BILIDIR,IBILI in the last 72 hours No results found for this basename: CHOL in the last 72 hours No results found for this basename: PROTIME in the last 72 hours  Imaging: Dg Chest Port 1 View  05/07/2011  *RADIOLOGY REPORT*  Clinical Data: Hypertension.  Endotracheal tube.  PORTABLE CHEST -  1 VIEW  Comparison: Portable chest 05/06/2011.  Findings: The patient has been extubated.  The NG tube was removed. Aeration is improved.  Emphysematous changes are noted.  Scarring or linear atelectasis in the left lung is again noted.  Remote right-sided rib fractures are evident.  IMPRESSION:  1.  Status post extubation and removal of the NG tube. 2.  Continued improvement in aeration with some residual scarring or linear atelectasis.  Original Report Authenticated By: Jamesetta Orleans. MATTERN, M.D.    Cardiac Studies: Telemetry shows NSR with PVCs and one run of 3 beats VT Assessment/Plan:   Atrial fibrillation (04/28/2011)    Assessment:Maintaining NSR.   Plan: Continue amiodarone.    LOS: 13 days    Cassell Clement 05/08/2011, 8:24 AM

## 2011-05-08 NOTE — Progress Notes (Signed)
Patient confused for the duration of the shift. Patient trying to get out of bed and attempted to strike staff members trying to get him back into bed. Also verbally abusing staff members. Since patient was a safety risk to himself and others also being a high fall risk with very unsteady gait MD on call paged and orders for Haldol protocol given. Also order for posey belt restraint given. Patient continued to try to get out of bed and strike staff so bilateral wrist restraints were obtained from the NP on call for the [facility]. Patient did calm down but continued to request that restraints be removed. Restraint removal criteria discussed with patient. Happenings of the night discussed with day shift RN. Will continue to assess patient. Patient also had 3 bts of Vtach at 0630 this morning. Patient asymptomatic. Of note patient has a history of beats of Vtach. Informed day shift RN of this activity as well.

## 2011-05-08 NOTE — Progress Notes (Signed)
CC: Hemorraghic Cystitis, h/o pelvic radiation    Subjective: 1 - hemorraghic Cystitis - Urine clearing, No problems with foley. Hgb approx stable.  Today [name] is at baseline with confusion and mild agitation.   Objective: Vital signs in last 24 hours: Temp:  [97.2 F (36.2 C)-98.6 F (37 C)] 97.5 F (36.4 C) (04/20 0617) Pulse Rate:  [80-99] 86  (04/20 0617) Resp:  [14-21] 18  (04/20 0617) BP: (129-168)/(59-90) 156/74 mmHg (04/20 0617) SpO2:  [95 %-100 %] 96 % (04/20 0617) Weight:  [73.573 kg (162 lb 3.2 oz)] 73.573 kg (162 lb 3.2 oz) (04/20 0617) Last BM Date: 05/07/11  Intake/Output from previous day: 04/19 0701 - 04/20 0700 In: 550 [I.V.:100; IV Piggyback:450] Out: 1000 [Urine:1000] Intake/Output this shift: Total I/O In: 60 [P.O.:60] Out: -   General appearance: alert and delirious Head: Normocephalic, without obvious abnormality, atraumatic Back: symmetric, no curvature. ROM normal. No CVA tenderness. GI: soft, non-tender; bowel sounds normal; no masses,  no organomegaly Male genitalia: normal, Foley c/d/i with pink urine, no clots Extremities: extremities normal, atraumatic, no cyanosis or edema and Homans sign is negative, no sign of DVT Neurologic: Mental status: Alert, oriented, thought content appropriate, at his baseline of confusion and mild agitation Incision/Wound:  Lab Results:   Basename 05/08/11 0451 05/07/11 0330  WBC 8.9 9.6  HGB 7.9* 8.4*  HCT 24.6* 25.9*  PLT 424* 357   BMET  Basename 05/08/11 0451 05/07/11 0330  NA 144 145  K 3.2* 3.3*  CL 110 109  CO2 23 24  GLUCOSE 121* 126*  BUN 24* 30*  CREATININE 1.17 1.13  CALCIUM 8.4 8.7   PT/INR No results found for this basename: LABPROT:2,INR:2 in the last 72 hours ABG  Basename 05/06/11 0355  PHART 7.496*  HCO3 25.8*    Studies/Results: Dg Chest Port 1 View  05/07/2011  *RADIOLOGY REPORT*  Clinical Data: Hypertension.  Endotracheal tube.  PORTABLE CHEST - 1 VIEW  Comparison:  Portable chest 05/06/2011.  Findings: The patient has been extubated.  The NG tube was removed. Aeration is improved.  Emphysematous changes are noted.  Scarring or linear atelectasis in the left lung is again noted.  Remote right-sided rib fractures are evident.  IMPRESSION:  1.  Status post extubation and removal of the NG tube. 2.  Continued improvement in aeration with some residual scarring or linear atelectasis.  Original Report Authenticated By: Jamesetta Orleans. MATTERN, M.D.    Anti-infectives: Anti-infectives     Start     Dose/Rate Route Frequency Ordered Stop   05/03/11 1800   vancomycin (VANCOCIN) 1,250 mg in sodium chloride 0.9 % 250 mL IVPB  Status:  Discontinued        1,250 mg 166.7 mL/hr over 90 Minutes Intravenous Every 24 hours 05/02/11 1840 05/04/11 0838   05/02/11 1845   vancomycin (VANCOCIN) 1,250 mg in sodium chloride 0.9 % 250 mL IVPB        1,250 mg 166.7 mL/hr over 90 Minutes Intravenous NOW 05/02/11 1840 05/03/11 1845   04/29/11 1300   vancomycin (VANCOCIN) 1,250 mg in sodium chloride 0.9 % 250 mL IVPB  Status:  Discontinued        1,250 mg 166.7 mL/hr over 90 Minutes Intravenous Every 24 hours 04/29/11 1158 05/02/11 0824   04/29/11 1200   piperacillin-tazobactam (ZOSYN) IVPB 3.375 g        3.375 g 12.5 mL/hr over 240 Minutes Intravenous Every 8 hours 04/29/11 0936     04/27/11 0900  cefTRIAXone (ROCEPHIN) 1 g in dextrose 5 % 50 mL IVPB  Status:  Discontinued        1 g 100 mL/hr over 30 Minutes Intravenous Every 24 hours 04/27/11 0828 04/29/11 0853          Assessment/Plan: 1 - Hemorraghic Cystitis - urine w/o clots. Consider DC FOLEY, as he is at high risk of pulling at tubes and making things worse. Addtionally, catheter itself may cause further bladder irritation in current setting w/o clots.  2 - Agitation - In our past experience with [name] he has occasionally done well with ethanol therapy.  3 - Will follow. [facility], Kyrin Garn 05/08/2011

## 2011-05-08 NOTE — Progress Notes (Signed)
Ambulated patient in the hall needing 2 person assist. Still doing okay without restraint but needs sitter to help with cueing. Continue to F/U with plan of care.

## 2011-05-08 NOTE — Progress Notes (Signed)
Patient ID: [name], male   DOB: July 22, 1926, 78 y.o.   MRN: [id number]  Assessment/Plan:  Principal Problem:   *Acute respiratory failure with hypoxia - status post extubation - respiratory status stable at this time - albuterol and atrovent for shortness of breath  Active Problems:   Acute blood loss anemia - secondary to hemorrhagic cystitis - hemoglobin today 7.0   Hemorrhagic cystitis - foley in place   Hospital acquired PNA - continue zosyn for now  Hypokalemia - replete - follow up BMP in am   GERD and PUD - continue protonix   Confusion - perhaps secondary to UTI versus PNA/dementia - psych consult apprecated   Hypertension - BP 156/74 - continue metoprolol   E-coli UTI - continue zosyn   Atrial fibrillation - rate controlled - continue amiodarone and metoprolol   ARF (acute renal failure) - secondary to UTI sepsis - Creatinine 1.17 <--1.13 <-- 1.39 <--1.20 - resolved at this time   Protein-calorie malnutrition, severe - nutrition consult   ADENOCARCINOMA, PROSTATE - history of prostate cancer      Subjective:  No events overnight.   Objective:  Vital signs in last 24 hours:   05/08/11 0226 05/08/11 0617  BP: 147/77 156/74  Pulse: 84 86  Temp: 97.2 F (36.2 C) 97.5 F (36.4 C)  TempSrc: Oral Oral  Resp: 19 18  Weight:  73.573 kg (162 lb 3.2 oz)  SpO2: 98% 96%    Intake/Output from previous day:   Gross per 24 hour  Intake    720 ml  Output   1000 ml  Net   -280 ml    Physical Exam: General:  in no acute distress. HEENT: No bruits, no goiter. Moist mucous membranes, no scleral icterus, no conjunctival pallor. Heart: Regular rate and rhythm, S1/S2 +, SEM murmur Lungs: Clear to auscultation bilaterally. No wheezing, no rhonchi, no rales.  Abdomen: Soft, nontender, nondistended, positive bowel sounds. Extremities: No clubbing or cyanosis, no pitting edema,  positive pedal pulses. Neuro: Grossly nonfocal.  Lab  Results:  Lab 05/08/11 0451 05/07/11 0330 05/06/11 0332 05/05/11 0330 05/04/11 0500  WBC 8.9 9.6 9.9 11.6* 14.0*  HGB 7.9* 8.4* 7.7* 7.2* 8.3*  HCT 24.6* 25.9* 23.6* 21.8* 25.5*  PLT 424* 357 278 214 196  MCV 90.8 90.9 90.4 89.7 90.7    Lab 05/08/11 0451 05/07/11 0330 05/06/11 0332 05/05/11 0330 05/04/11 0500  NA 144 145 142 143 142  K 3.2* 3.3* 4.3 3.8 4.1  CL 110 109 106 106 104  CO2 23 24 27 29 28   GLUCOSE 121* 126* 203* 105* 154*  BUN 24* 30* 36* 27* 26*  CREATININE 1.17 1.13 1.39* 1.20 1.34  CALCIUM 8.4 8.7 8.2* 8.3* 8.4    CULTURE, BLOOD (ROUTINE X 2)     Status: Normal   Collection Time   04/29/11 12:45 PM      Component Value Range Status Comment   Specimen Description BLOOD CL   Final    Culture NO GROWTH 5 DAYS   Final    Report Status 05/06/2011 FINAL   Final   CULTURE, BLOOD (ROUTINE X 2)     Status: Normal   Collection Time   04/29/11  4:15 PM      Component Value Range Status Comment   Specimen Description BLOOD RIGHT ARM   Final    Culture NO GROWTH 5 DAYS   Final    Report Status 05/06/2011 FINAL   Final   CULTURE, RESPIRATORY  Status: Normal   Collection Time   04/29/11  4:29 PM      Component Value Range Status Comment   Specimen Description TRACHEAL ASPIRATE   Final    Special Requests Immunocompromised   Final    Gram Stain     Final    Value: RARE WBC PRESENT,BOTH PMN AND MONONUCLEAR     NO ORGANISMS SEEN   Report Status 05/02/2011 FINAL   Final   CLOSTRIDIUM DIFFICILE BY PCR     Status: Normal   Collection Time   05/04/11  2:12 PM      Component Value Range Status Comment   C difficile by pcr NEGATIVE  NEGATIVE  Final     Studies/Results: Dg Chest Port 1 View 05/07/2011 IMPRESSION:  1.  Status post extubation and removal of the NG tube. 2.  Continued improvement in aeration with some residual scarring or linear atelectasis.    Medications: Scheduled Meds:   . acetaminophen  650 mg Oral Once  . amiodarone  200 mg Oral BID  . clotrimazole    Topical BID  . folic acid  1 mg Intravenous Daily  . haloperidol lactate  5 mg Intravenous prn  . insulin aspart  0-15 Units Subcutaneous Q4H  . ipratropium  0.5 mg Nebulization BID  . levalbuterol  0.63 mg Nebulization BID  . pantoprazole (PROTONIX) IV  40 mg Intravenous Q24H  . piperacillin-tazobactam (ZOSYN)  IV  3.375 g Intravenous Q8H  . potassium chloride  10 mEq Intravenous Q1 Hr x 3  . thiamine  100 mg Intravenous Daily   Continuous Infusions:   . sodium chloride 10 mL/hr at 05/06/11 2000   PRN Meds:.acetaminophen (TYLENOL) oral liquid 160 mg/5 mL, fentaNYL, food thickener, haloperidol lactate, metoprolol, DISCONTD: haloperidol lactate, DISCONTD: haloperidol lactate    LOS: 13 days   [name] 05/08/2011, 2:58 PM  [facility] Pager: [phone]

## 2011-05-08 NOTE — Progress Notes (Signed)
Patient have been out of restraint since 2pm, still confuse but not combative follows most command, still requiring cue/reinforcement not to get out of bed which the sitter at the bedside is helping remind patient. Will continue to assess patient.

## 2011-05-08 NOTE — Progress Notes (Signed)
Speech Language Pathology Dysphagia Treatment  Patient Details Name: [name] MRN: [id number] DOB: [birth year] Today's Date: 05/08/2011  SLP Assessment/Plan/Recommendation Assessment / Recommendations / Plan Clinical Impression Statement: Pt continuing to demonstrate clinical signs of dyspahgia, however his lungs are clear and he is afebrile.  Per sitter in room, pt without coughing or problems swallowing breakfast - pt consumed a few bites of grits and drank nectar liquid (sitter provided via tsp).  Pt is impulsive and needs total cues to take small sips, appeared to tolerate tsp better than cup.  Pt observed with graham cracker, 4 ounces icecream, and 8 ounces Ensure.   At completion of snack, pt's voice was "gurgly", encouraged him to cough and expectorate - viscous secretions with small amount of Ensure.  Do not recommend advance diet currently and follow STRICT aspiration precautions.  SLP to follow up Monday to determine tolerance and readiness for advancement.  Pt acknowledges h/o coughing with chocolate.  Hopeful for pt to retrurn to baseline level of dysphagia with decr edema from 7 days of intubation.  RN and sitter educated. Thanks!  Continue with Current Diet: Dysphagia 3 (mechanical soft);Nectar-thick liquid Liquids provided via: Teaspoon (if cup causes coughing) Medication Administration: Crushed with puree Supervision: Full supervision/cueing for compensatory strategies Compensations: Slow rate;Small sips/bites (cough and expectorate if voice is wet) Postural Changes and/or Swallow Maneuvers: Seated upright 90 degrees;Upright 30-60 min after meal Oral Care Recommendations: Oral care QID Plan: Continue with current plan of care Swallowing Goals   Tolerate diet without s/s of aspiration  With min assist.  Progressing.  General Respiratory Status: Room air Oral Cavity - Dentition: Dentures, top;Dentures, bottom Patient Positioning: Other (comment) (restrained)  Dysphagia  Treatment Treatment focused on: Skilled observation of diet tolerance Treatment Methods/Modalities: Skilled observation Feeding: Able to feed self (self feeding with right hand unrestrained takes large boluse) Liquids provided via: Straw;Cup;Teaspoon Pharyngeal Phase Signs & Symptoms: Suspected delayed swallow initiation;Delayed throat clear;Delayed cough Type of cueing: Verbal;Tactile Amount of cueing: Total   [name], [facility] SLP [phone]

## 2011-05-09 LAB — GLUCOSE, CAPILLARY
Glucose-Capillary: 98 mg/dL (ref 70–99)
Glucose-Capillary: 107 mg/dL — ABNORMAL HIGH (ref 70–99)
Glucose-Capillary: 100 mg/dL — ABNORMAL HIGH (ref 70–99)
Glucose-Capillary: 98 mg/dL (ref 70–99)

## 2011-05-09 LAB — CBC
HCT: 23.3 % — ABNORMAL LOW (ref 39.0–52.0)
MCH: 29 pg (ref 26.0–34.0)
MCHC: 31.3 g/dL (ref 30.0–36.0)
MCV: 92.5 fL (ref 78.0–100.0)
Platelets: 433 10*3/uL — ABNORMAL HIGH (ref 150–400)
RDW: 15 % (ref 11.5–15.5)

## 2011-05-09 LAB — BASIC METABOLIC PANEL
BUN: 15 mg/dL (ref 6–23)
Calcium: 8.2 mg/dL — ABNORMAL LOW (ref 8.4–10.5)
Creatinine, Ser: 1.12 mg/dL (ref 0.50–1.35)
GFR calc Af Amer: 68 mL/min — ABNORMAL LOW (ref >90)
GFR calc non Af Amer: 59 mL/min — ABNORMAL LOW (ref >90)

## 2011-05-09 MED ORDER — METOPROLOL TARTRATE 12.5 MG HALF TABLET
12.5000 mg | ORAL_TABLET | Freq: Two times a day (BID) | ORAL | Status: DC
  Administered 2011-05-09 – 2011-05-11 (×5): 12.5 mg via ORAL
  Filled 2011-05-09 (×7): qty 1

## 2011-05-09 MED ORDER — ENSURE PUDDING PO PUDG
1.0000 | Freq: Two times a day (BID) | ORAL | Status: DC
  Administered 2011-05-09 – 2011-05-11 (×4): 1 via ORAL
  Filled 2011-05-09 (×6): qty 1

## 2011-05-09 MED ORDER — PRO-STAT SUGAR FREE PO LIQD
30.0000 mL | Freq: Three times a day (TID) | ORAL | Status: DC
  Administered 2011-05-09 – 2011-05-11 (×6): 30 mL via ORAL
  Filled 2011-05-09 (×8): qty 30

## 2011-05-09 MED ORDER — POTASSIUM CHLORIDE 10 MEQ/100ML IV SOLN
10.0000 meq | INTRAVENOUS | Status: AC
  Administered 2011-05-09 (×3): 10 meq via INTRAVENOUS
  Filled 2011-05-09 (×3): qty 100

## 2011-05-09 NOTE — Progress Notes (Signed)
Patient ID: [name], male   DOB: January 08, 1927, 78 y.o.   MRN: [id number]  Assessment/Plan:   Principal Problem:   *Acute respiratory failure with hypoxia  - status post extubation  - respiratory status stable at this time  - albuterol and atrovent for shortness of breath   Active Problems:   Acute blood loss anemia  - secondary to hemorrhagic cystitis  - hemoglobin today 7.3   Hemorrhagic cystitis  - foley in place   Hospital acquired PNA  - continue zosyn for now   Hypokalemia  - repleted today  - follow up BMP in am   GERD and PUD  - continue protonix   Confusion  - perhaps secondary to UTI versus PNA/dementia  - psych consult apprecated   Hypertension  - BP 154/67 - continue metoprolol  - as per cardio will need statin on discharge  E-coli UTI  - continue zosyn   Atrial fibrillation  - rate controlled  - continue amiodarone and metoprolol   ARF (acute renal failure)  - secondary to UTI sepsis  - Creatinine 1.17 <--1.13 <-- 1.39 <--1.20  - resolved at this time   Protein-calorie malnutrition, severe  - nutrition consult   ADENOCARCINOMA, PROSTATE  - history of prostate cancer   Subjective: No events overnight. Patient denies chest pain, shortness of breath, abdominal pain. Had bowel movement and reports ambulating.  Objective:  Vital signs in last 24 hours:  Filed Vitals:   05/08/11 2158 05/09/11 0612 05/09/11 0945 05/09/11 1010  BP: 142/65 148/73 154/67   Pulse: 82 66 60   Temp: 97.8 F (36.6 C) 98 F (36.7 C)    TempSrc: Oral Oral    Resp: 18 18    Height:      Weight:  74.4 kg (164 lb 0.4 oz)    SpO2: 95% 95%  97%    Intake/Output from previous day:   Intake/Output Summary (Last 24 hours) at 05/09/11 1156 Last data filed at 05/09/11 0901  Gross per 24 hour  Intake   1850 ml  Output    800 ml  Net   1050 ml    Physical Exam: General:  no acute distress. HEENT: No bruits, no goiter. Moist mucous membranes, no scleral  icterus, no conjunctival pallor. Heart: Regular rate and rhythm, S1/S2 +, no murmurs, rubs, gallops. Lungs: Clear to auscultation bilaterally. No wheezing, no rhonchi, no rales.  Abdomen: Soft, nontender, nondistended, positive bowel sounds. Extremities: No clubbing or cyanosis, no pitting edema,  positive pedal pulses. Neuro: Grossly nonfocal.  Lab Results:  Lab 05/09/11 0449 05/08/11 0451 05/07/11 0330 05/06/11 0332 05/05/11 0330  WBC 9.6 8.9 9.6 9.9 11.6*  HGB 7.3* 7.9* 8.4* 7.7* 7.2*  HCT 23.3* 24.6* 25.9* 23.6* 21.8*  PLT 433* 424* 357 278 214  MCV 92.5 90.8 90.9 90.4 89.7    Lab 05/09/11 0449 05/08/11 0451 05/07/11 0330 05/06/11 0332 05/05/11 0330  NA 141 144 145 142 143  K 3.3* 3.2* 3.3* 4.3 3.8  CL 110 110 109 106 106  CO2 21 23 24 27 29   GLUCOSE 97 121* 126* 203* 105*  BUN 15 24* 30* 36* 27*  CREATININE 1.12 1.17 1.13 1.39* 1.20  CALCIUM 8.2* 8.4 8.7 8.2* 8.3*    Recent Results (from the past 240 hour(s))  CULTURE, BLOOD (ROUTINE X 2)     Status: Normal   Collection Time   04/29/11 12:45 PM      Component Value Range Status Comment   Specimen  Description BLOOD CL   Final    Special Requests BOTTLES DRAWN AEROBIC AND ANAEROBIC 10CC   Final    Culture  Setup Time [id number]   Final    Culture NO GROWTH 5 DAYS   Final    Report Status 05/06/2011 FINAL   Final   CULTURE, BLOOD (ROUTINE X 2)     Status: Normal   Collection Time   04/29/11  4:15 PM      Component Value Range Status Comment   Specimen Description BLOOD RIGHT ARM   Final    Special Requests BOTTLES DRAWN AEROBIC AND ANAEROBIC 4 CC EACH   Final    Culture  Setup Time [id number]   Final    Culture NO GROWTH 5 DAYS   Final    Report Status 05/06/2011 FINAL   Final   CULTURE, RESPIRATORY     Status: Normal   Collection Time   04/29/11  4:29 PM      Component Value Range Status Comment   Specimen Description TRACHEAL ASPIRATE   Final    Special Requests Immunocompromised   Final    Gram Stain      Final    Value: RARE WBC PRESENT,BOTH PMN AND MONONUCLEAR     RARE SQUAMOUS EPITHELIAL CELLS PRESENT     NO ORGANISMS SEEN   Culture Non-Pathogenic Oropharyngeal-type Flora Isolated.   Final    Report Status 05/02/2011 FINAL   Final   CLOSTRIDIUM DIFFICILE BY PCR     Status: Normal   Collection Time   05/04/11  2:12 PM      Component Value Range Status Comment   C difficile by pcr NEGATIVE  NEGATIVE  Final    Medications: Scheduled Meds:   . acetaminophen  650 mg Oral Once  . amiodarone  200 mg Oral BID  . antiseptic oral rinse  15 mL Mouth Rinse BID  . chlorhexidine  15 mL Mouth/Throat QID  . clotrimazole   Topical BID  . feeding supplement  1 Container Oral BID BM  . feeding supplement  30 mL Oral TID WC  . folic acid  1 mg Intravenous Daily  . haloperidol lactate      . insulin aspart  0-15 Units Subcutaneous Q4H  . ipratropium  0.5 mg Nebulization BID  . levalbuterol  0.63 mg Nebulization BID  . metoprolol tartrate  12.5 mg Oral BID  . pantoprazole (PROTONIX) IV  40 mg Intravenous Q24H  . piperacillin-tazobactam (ZOSYN)  IV  3.375 g Intravenous Q8H  . potassium chloride  10 mEq Intravenous Q1 Hr x 3  . potassium chloride  10 mEq Intravenous Q1 Hr x 3  . sodium chloride  3 mL Intravenous Q12H  . thiamine  100 mg Intravenous Daily   Continuous Infusions:   . sodium chloride 10 mL/hr at 05/06/11 2000   PRN Meds:.acetaminophen (TYLENOL) oral liquid 160 mg/5 mL, fentaNYL, food thickener, haloperidol lactate, metoprolol, DISCONTD: haloperidol lactate   LOS: 14 days   Rionna Feltes 05/09/2011, 11:56 AM  [facility] Pager: [phone]

## 2011-05-09 NOTE — Progress Notes (Signed)
Nutrition Follow-up/Consult secondary to malnutrition/ Initial assessment 4/12-Severe Malnutrition in the context of acute illness  Spoke with patient.  There are many foods that he doesn't like.  Doesn't like meat "don't like to eat animals" and fish not prepared to his liking.  Wants beer but knows that this is not a good choice.  Dislikes many of the thickened liquids.  Diet Order:  Dysphagia 3 nectar thick, 2 gm Na  Meds: Scheduled Meds:   . acetaminophen  650 mg Oral Once  . amiodarone  200 mg Oral BID  . antiseptic oral rinse  15 mL Mouth Rinse BID  . chlorhexidine  15 mL Mouth/Throat QID  . clotrimazole   Topical BID  . folic acid  1 mg Intravenous Daily  . haloperidol lactate      . insulin aspart  0-15 Units Subcutaneous Q4H  . ipratropium  0.5 mg Nebulization BID  . levalbuterol  0.63 mg Nebulization BID  . metoprolol tartrate  12.5 mg Oral BID  . pantoprazole (PROTONIX) IV  40 mg Intravenous Q24H  . piperacillin-tazobactam (ZOSYN)  IV  3.375 g Intravenous Q8H  . potassium chloride  10 mEq Intravenous Q1 Hr x 3  . potassium chloride  10 mEq Intravenous Q1 Hr x 3  . sodium chloride  3 mL Intravenous Q12H  . thiamine  100 mg Intravenous Daily   Continuous Infusions:   . sodium chloride 10 mL/hr at 05/06/11 2000   PRN Meds:.acetaminophen (TYLENOL) oral liquid 160 mg/5 mL, fentaNYL, food thickener, haloperidol lactate, metoprolol, DISCONTD: haloperidol lactate  Labs:  CMP     Component Value Date/Time   NA 141 05/09/2011 0449   K 3.3* 05/09/2011 0449   CL 110 05/09/2011 0449   CO2 21 05/09/2011 0449   GLUCOSE 97 05/09/2011 0449   BUN 15 05/09/2011 0449   CREATININE 1.12 05/09/2011 0449   CREATININE 1.05 01/29/2011 1708   CALCIUM 8.2* 05/09/2011 0449   PROT 6.9 04/28/2011 0335   ALBUMIN 2.5* 04/28/2011 0335   AST 27 04/28/2011 0335   ALT 13 04/28/2011 0335   ALKPHOS 95 04/28/2011 0335   BILITOT 0.5 04/28/2011 0335   GFRNONAA 59* 05/09/2011 0449   GFRAA 68* 05/09/2011 0449      Intake/Output Summary (Last 24 hours) at 05/09/11 1027 Last data filed at 05/09/11 0403  Gross per 24 hour  Intake   1480 ml  Output   1100 ml  Net    380 ml    Weight Status:   Wt Readings from Last 3 Encounters:  05/09/11 164 lb 0.4 oz (74.4 kg)  02/15/11 173 lb (78.472 kg)  02/15/11 173 lb (78.472 kg)    Re-estimated needs:  2000-2200 kcal, 90-100 g protein  Nutrition Dx:  Inadequate oral intake, ongoing  Goal:   1.  Patient will meet 100% estimated needs with TF-met 2.  Patient to have positive tolerance of TF-met 3.  Advance diet as medically able to oral nutrition-met  New Goal:   1.  Meet >90% estimated needs with meals and supplements.  Intervention:   1.  Add Prostat 30 ml tid po 2.  Add Ensure Pudding bid 3.  Magic cup with meals (dessert supplement) 4.  Encouraged po and obtained preferences MD please remove sodium restriction from current diet to liberalize choices as pt not eating well.  Monitor:  Intake, weights, labs   Jeoffrey Massed Pager #:  [phone]

## 2011-05-09 NOTE — Progress Notes (Signed)
Subjective:  The patient remains confused and disoriented. Less agitated than yesterday.  No longer in restraints.  Sitter present. Denies any chest pain or dyspnea.  Objective:  Vital Signs in the last 24 hours: Temp:  [97.8 F (36.6 C)-98.7 F (37.1 C)] 98 F (36.7 C) (04/21 0612) Pulse Rate:  [54-82] 66  (04/21 0612) Resp:  [18] 18  (04/21 0612) BP: (123-148)/(63-73) 148/73 mmHg (04/21 0612) SpO2:  [95 %-97 %] 95 % (04/21 0612) FiO2 (%):  [21 %] 21 % (04/20 0828) Weight:  [74.4 kg (164 lb 0.4 oz)] 74.4 kg (164 lb 0.4 oz) (04/21 0612)  Intake/Output from previous day: 04/20 0701 - 04/21 0700 In: 1800 [P.O.:1620; I.V.:130; IV Piggyback:50] Out: 1100 [Urine:1100] Intake/Output from this shift:       . acetaminophen  650 mg Oral Once  . amiodarone  200 mg Oral BID  . antiseptic oral rinse  15 mL Mouth Rinse BID  . chlorhexidine  15 mL Mouth/Throat QID  . clotrimazole   Topical BID  . folic acid  1 mg Intravenous Daily  . haloperidol lactate      . insulin aspart  0-15 Units Subcutaneous Q4H  . ipratropium  0.5 mg Nebulization BID  . levalbuterol  0.63 mg Nebulization BID  . pantoprazole (PROTONIX) IV  40 mg Intravenous Q24H  . piperacillin-tazobactam (ZOSYN)  IV  3.375 g Intravenous Q8H  . potassium chloride  10 mEq Intravenous Q1 Hr x 3  . sodium chloride  3 mL Intravenous Q12H  . thiamine  100 mg Intravenous Daily  . DISCONTD: ipratropium  0.5 mg Nebulization Q6H  . DISCONTD: levalbuterol  0.63 mg Nebulization Q6H      . sodium chloride 10 mL/hr at 05/06/11 2000    Physical Exam: The patient appears to be in mild distress seondary to fighting against wrist restraints  Head and neck exam reveals that the pupils are equal and reactive.  The extraocular movements are full.  There is no scleral icterus.  Mouth and pharynx are benign.  No lymphadenopathy.  No carotid bruits.  The jugular venous pressure is normal.  Thyroid is not enlarged or tender.  Chest is  clear to percussion and auscultation.  No rales or rhonchi.  Expansion of the chest is symmetrical.  Heart reveals no abnormal lift or heave.  First and second heart sounds are normal.  There is no gallop rub or click. Grade 2/6 aortic systolic murmur across prosthetic valve  The abdomen is soft and nontender.  Bowel sounds are normoactive.  There is no hepatosplenomegaly or mass.  There are no abdominal bruits.  Extremities reveal no phlebitis or edema.  Pedal pulses are good.  There is no cyanosis or clubbing.  Neurologic exam is normal strength and no lateralizing weakness.  No sensory deficits.  Integument reveals no rash  Lab Results:  Basename 05/09/11 0449 05/08/11 0451  WBC 9.6 8.9  HGB 7.3* 7.9*  PLT 433* 424*    Basename 05/09/11 0449 05/08/11 0451  NA 141 144  K 3.3* 3.2*  CL 110 110  CO2 21 23  GLUCOSE 97 121*  BUN 15 24*  CREATININE 1.12 1.17   No results found for this basename: TROPONINI:2,CK,MB:2 in the last 72 hours Hepatic Function Panel No results found for this basename: PROT,ALBUMIN,AST,ALT,ALKPHOS,BILITOT,BILIDIR,IBILI in the last 72 hours No results found for this basename: CHOL in the last 72 hours No results found for this basename: PROTIME in the last 72 hours  Imaging: No  results found.  Cardiac Studies: Telemetry shows NSR .  No VT in past 24 hours Assessment/Plan:   Atrial fibrillation (04/28/2011)   Assessment:Maintaining NSR.   Plan: Continue amiodarone. S/P aortic valve replacement and CABG on 09/23/10    Assessment: Stable    Plan:Presently off ASA because of anemia and hematuria.            Restart Metoprolol orally now.            Restart statin prior to discharge.    LOS: 14 days    Cassell Clement 05/09/2011, 8:01 AM

## 2011-05-09 NOTE — Progress Notes (Signed)
ANTIBIOTIC CONSULT NOTE - Follow up  Pharmacy Consult for Zosyn Indication: pneumonia, cellulitis, UTI  Allergies  Allergen Reactions  . Tolterodine Tartrate     Patient Measurements: Height: 5\' 11"  (180.3 cm) Weight: 164 lb 0.4 oz (74.4 kg) IBW/kg (Calculated) : 75.3   Vital Signs: Temp: 98 F (36.7 C) (04/21 0612) Temp src: Oral (04/21 0612) BP: 154/67 mmHg (04/21 0945) Pulse Rate: 60  (04/21 0945)  Labs:  Basename 05/09/11 0449 05/08/11 0451 05/07/11 0330  WBC 9.6 8.9 9.6  HGB 7.3* 7.9* 8.4*  PLT 433* 424* 357  LABCREA -- -- --  CREATININE 1.12 1.17 1.13   Estimated Creatinine Clearance: 52.6 ml/min (by C-G formula based on Cr of 1.12).   Microbiology: 4/7 mrsa pcr: negative 4/7 urine: >100k E.coli (sensitive to all agents tested except ampicillin) 4/11 Blood: NGF 4/11 Trach asp:  Normal flora 4/16 Cdiff: negative   Medications:  Anti-infectives     Start     Dose/Rate Route Frequency Ordered Stop   05/03/11 1800   vancomycin (VANCOCIN) 1,250 mg in sodium chloride 0.9 % 250 mL IVPB  Status:  Discontinued        1,250 mg 166.7 mL/hr over 90 Minutes Intravenous Every 24 hours 05/02/11 1840 05/04/11 0838   05/02/11 1845   vancomycin (VANCOCIN) 1,250 mg in sodium chloride 0.9 % 250 mL IVPB        1,250 mg 166.7 mL/hr over 90 Minutes Intravenous NOW 05/02/11 1840 05/03/11 1845   04/29/11 1300   vancomycin (VANCOCIN) 1,250 mg in sodium chloride 0.9 % 250 mL IVPB  Status:  Discontinued        1,250 mg 166.7 mL/hr over 90 Minutes Intravenous Every 24 hours 04/29/11 1158 05/02/11 0824   04/29/11 1200  piperacillin-tazobactam (ZOSYN) IVPB 3.375 g       3.375 g 12.5 mL/hr over 240 Minutes Intravenous Every 8 hours 04/29/11 0936     04/27/11 0900   cefTRIAXone (ROCEPHIN) 1 g in dextrose 5 % 50 mL IVPB  Status:  Discontinued        1 g 100 mL/hr over 30 Minutes Intravenous Every 24 hours 04/27/11 0828 04/29/11 0853          Assessment:  50 YOM with  prostate cancer presents with gross hematuria due to radiation cystitis.  Received 2 doses Rocephin for E.coli UTI, then expanded coverage to Vanc/Zosyn for HCAP with aspiration, UTI and cellulitis  D#11 Zosyn (completed 6 days of vanc)  Cultures negative to date, except 4/7 urine: >100k E.coli  SCr stable/ improving.  CrCl ~ 50 ml/min  Goal of Therapy:  Eradication of infection  Plan:  Continue Zosyn 3.375g IV Q8H infused over 4hrs. Follow renal function, labs, and cultures as available. What is planned duration of therapy?  Narrow to ceftriaxone or cipro based on E.coli cultures?   Loralee Pacas, PharmD, BCPS Pager: [phone] 05/09/2011 10:10 AM

## 2011-05-09 NOTE — Progress Notes (Signed)
Patient had 15 beats of V-tach, patient denies any chest pain or distress, vitals 133/75, 64, 98.0, 94%-RA. Asymptomatic; Dr. Elisabeth Pigeon notified, no new order given. Will continue to assess patient.

## 2011-05-10 LAB — GLUCOSE, CAPILLARY
Glucose-Capillary: 108 mg/dL — ABNORMAL HIGH (ref 70–99)
Glucose-Capillary: 89 mg/dL (ref 70–99)
Glucose-Capillary: 97 mg/dL (ref 70–99)
Glucose-Capillary: 98 mg/dL (ref 70–99)
Glucose-Capillary: 99 mg/dL (ref 70–99)

## 2011-05-10 LAB — CBC
Platelets: 526 10*3/uL — ABNORMAL HIGH (ref 150–400)
RDW: 14.9 % (ref 11.5–15.5)
WBC: 6.9 10*3/uL (ref 4.0–10.5)

## 2011-05-10 LAB — BASIC METABOLIC PANEL
Calcium: 8.7 mg/dL (ref 8.4–10.5)
Chloride: 108 mEq/L (ref 96–112)
Creatinine, Ser: 1.08 mg/dL (ref 0.50–1.35)
GFR calc Af Amer: 71 mL/min — ABNORMAL LOW (ref >90)
Sodium: 139 mEq/L (ref 135–145)

## 2011-05-10 MED ORDER — VITAMIN B-1 100 MG PO TABS
100.0000 mg | ORAL_TABLET | Freq: Every day | ORAL | Status: DC
  Administered 2011-05-11: 100 mg via ORAL
  Filled 2011-05-10 (×2): qty 1

## 2011-05-10 MED ORDER — POTASSIUM CHLORIDE 10 MEQ/100ML IV SOLN
10.0000 meq | INTRAVENOUS | Status: AC
  Administered 2011-05-10 (×2): 10 meq via INTRAVENOUS
  Filled 2011-05-10 (×2): qty 100

## 2011-05-10 MED ORDER — FOLIC ACID 1 MG PO TABS
1.0000 mg | ORAL_TABLET | Freq: Every day | ORAL | Status: DC
  Administered 2011-05-10 – 2011-05-11 (×2): 1 mg via ORAL
  Filled 2011-05-10 (×2): qty 1

## 2011-05-10 MED ORDER — LORAZEPAM 2 MG/ML IJ SOLN
1.0000 mg | Freq: Four times a day (QID) | INTRAMUSCULAR | Status: DC | PRN
  Administered 2011-05-10 (×2): 1 mg via INTRAVENOUS
  Filled 2011-05-10 (×2): qty 1

## 2011-05-10 NOTE — Progress Notes (Signed)
Patient ID: [name], male   DOB: 05/23/1926, 78 y.o.   MRN: [id number]  Assessment/Plan:  Principal Problem:  *Acute respiratory failure with hypoxia  - status post extubation  - respiratory status stable at this time  - albuterol and atrovent for shortness of breath  Active Problems:  Acute blood loss anemia  - secondary to hemorrhagic cystitis  - hemoglobin 7.3  Hemorrhagic cystitis  - foley in place  Hospital acquired PNA  - continue zosyn for now  Hypokalemia  - repleted today  - follow up BMP in am  GERD and PUD  - continue protonix  Confusion  - perhaps secondary to UTI versus PNA/dementia  - psych consult apprecated  Hypertension  - BP 154/67  - continue metoprolol  - as per cardio will need statin on discharge  E-coli UTI  - continue zosyn  Atrial fibrillation  - rate controlled  - continue amiodarone and metoprolol  ARF (acute renal failure)  - secondary to UTI sepsis  - Creatinine 1.17 <--1.13 <-- 1.39 <--1.20  - resolved at this time  Protein-calorie malnutrition, severe  - nutrition consult  ADENOCARCINOMA, PROSTATE  - history of prostate cancer   Subjective: No events overnight.   Objective:  Vital signs in last 24 hours:  Filed Vitals:   05/10/11 0517 05/10/11 0906 05/10/11 1511 05/10/11 2205  BP: 172/78  129/71 170/74  Pulse: 70  60 84  Temp: 97.4 F (36.3 C)  97.4 F (36.3 C) 97.8 F (36.6 C)  TempSrc: Oral  Oral Oral  Resp: 18  16 18   Height:      Weight: 72.5 kg (159 lb 13.3 oz)     SpO2: 96% 100% 100% 96%    Intake/Output from previous day:   Intake/Output Summary (Last 24 hours) at 05/10/11 2232 Last data filed at 05/10/11 1511  Gross per 24 hour  Intake    360 ml  Output    875 ml  Net   -515 ml    Physical Exam: General:  no acute distress. HEENT: No bruits, no goiter. Moist mucous membranes, no scleral icterus, no conjunctival pallor. Heart: Regular rate and rhythm, S1/S2 +, SEM Lungs: Clear to auscultation  bilaterally. No wheezing, no rhonchi, no rales.  Abdomen: Soft, nontender, nondistended, positive bowel sounds. Extremities: No clubbing or cyanosis, no pitting edema,  positive pedal pulses. Neuro: Grossly nonfocal.  Lab Results:   Lab 05/10/11 1129 05/09/11 0449 05/08/11 0451 05/07/11 0330 05/06/11 0332  WBC 6.9 9.6 8.9 9.6 9.9  HGB 8.7* 7.3* 7.9* 8.4* 7.7*  HCT 27.8* 23.3* 24.6* 25.9* 23.6*  PLT 526* 433* 424* 357 278  MCV 92.1 92.5 90.8 90.9 90.4  MCH 28.8 29.0 29.2 29.5 29.5  MCHC 31.3 31.3 32.1 32.4 32.6  RDW 14.9 15.0 14.7 14.7 14.7  LYMPHSABS -- -- -- -- --  MONOABS -- -- -- -- --  EOSABS -- -- -- -- --  BASOSABS -- -- -- -- --  BANDABS -- -- -- -- --    Lab 05/10/11 1129 05/09/11 0449 05/08/11 0451 05/07/11 0330 05/06/11 0332 05/05/11 0330 05/04/11 0500  NA 139 141 144 145 142 -- --  K 3.3* 3.3* 3.2* 3.3* 4.3 -- --  CL 108 110 110 109 106 -- --  CO2 21 21 23 24 27  -- --  GLUCOSE 106* 97 121* 126* 203* -- --  BUN 12 15 24* 30* 36* -- --  CREATININE 1.08 1.12 1.17 1.13 1.39* -- --  CALCIUM 8.7  8.2* 8.4 8.7 8.2* -- --  MG -- -- -- 2.3 2.2 2.0 1.9   No results found for this basename: INR:5,PROTIME:5 in the last 168 hours Cardiac markers: No results found for this basename: CK:3,CKMB:3,TROPONINI:3,MYOGLOBIN:3 in the last 168 hours No components found with this basename: POCBNP:3 Recent Results (from the past 240 hour(s))  CLOSTRIDIUM DIFFICILE BY PCR     Status: Normal   Collection Time   05/04/11  2:12 PM      Component Value Range Status Comment   C difficile by pcr NEGATIVE  NEGATIVE  Final     Studies/Results: No results found.  Medications: Scheduled Meds:   . acetaminophen  650 mg Oral Once  . amiodarone  200 mg Oral BID  . antiseptic oral rinse  15 mL Mouth Rinse BID  . chlorhexidine  15 mL Mouth/Throat QID  . clotrimazole   Topical BID  . feeding supplement  1 Container Oral BID BM  . feeding supplement  30 mL Oral TID WC  . folic acid  1 mg Oral  Daily  . insulin aspart  0-15 Units Subcutaneous Q4H  . ipratropium  0.5 mg Nebulization BID  . levalbuterol  0.63 mg Nebulization BID  . metoprolol tartrate  12.5 mg Oral BID  . pantoprazole (PROTONIX) IV  40 mg Intravenous Q24H  . potassium chloride  10 mEq Intravenous Q1 Hr x 2  . sodium chloride  3 mL Intravenous Q12H  . thiamine  100 mg Oral Daily  . DISCONTD: folic acid  1 mg Intravenous Daily  . DISCONTD: piperacillin-tazobactam (ZOSYN)  IV  3.375 g Intravenous Q8H  . DISCONTD: thiamine  100 mg Intravenous Daily   Continuous Infusions:   . sodium chloride 10 mL/hr at 05/06/11 2000   PRN Meds:.acetaminophen (TYLENOL) oral liquid 160 mg/5 mL, fentaNYL, food thickener, haloperidol lactate, LORazepam, metoprolol    LOS: 15 days   ,  05/10/2011, 10:32 PM  [facility] Pager: [phone]

## 2011-05-10 NOTE — Progress Notes (Signed)
Patient ID: [name], male   DOB: 21-Jul-1926, 78 y.o.   MRN: [id number]   Subjective:  No agitation.  Disoriented.  Able to converse.  Wife with him  Objective:  Vital Signs in the last 24 hours: Temp:  [97.4 F (36.3 C)-98.1 F (36.7 C)] 97.4 F (36.3 C) (04/22 0517) Pulse Rate:  [60-78] 70  (04/22 0517) Resp:  [13-18] 18  (04/22 0517) BP: (132-172)/(67-80) 172/78 mmHg (04/22 0517) SpO2:  [96 %-97 %] 96 % (04/22 0517) FiO2 (%):  [21 %] 21 % (04/21 1010) Weight:  [72.5 kg (159 lb 13.3 oz)] 72.5 kg (159 lb 13.3 oz) (04/22 0517)  Intake/Output from previous day: 04/21 0701 - 04/22 0700 In: 540 [P.O.:540] Out: 1375 [Urine:1375] Intake/Output from this shift:       . acetaminophen  650 mg Oral Once  . amiodarone  200 mg Oral BID  . antiseptic oral rinse  15 mL Mouth Rinse BID  . chlorhexidine  15 mL Mouth/Throat QID  . clotrimazole   Topical BID  . feeding supplement  1 Container Oral BID BM  . feeding supplement  30 mL Oral TID WC  . folic acid  1 mg Intravenous Daily  . insulin aspart  0-15 Units Subcutaneous Q4H  . ipratropium  0.5 mg Nebulization BID  . levalbuterol  0.63 mg Nebulization BID  . metoprolol tartrate  12.5 mg Oral BID  . pantoprazole (PROTONIX) IV  40 mg Intravenous Q24H  . piperacillin-tazobactam (ZOSYN)  IV  3.375 g Intravenous Q8H  . potassium chloride  10 mEq Intravenous Q1 Hr x 3  . sodium chloride  3 mL Intravenous Q12H  . thiamine  100 mg Intravenous Daily      . sodium chloride 10 mL/hr at 05/06/11 2000    Physical Exam: The patient appears to be in mild distress seondary to fighting against wrist restraints  Head and neck exam reveals that the pupils are equal and reactive.  The extraocular movements are full.  There is no scleral icterus.  Mouth and pharynx are benign.  No lymphadenopathy.  No carotid bruits.  The jugular venous pressure is normal.  Thyroid is not enlarged or tender.  Chest is clear to percussion and auscultation.   No rales or rhonchi.  Expansion of the chest is symmetrical.  Heart reveals no abnormal lift or heave.  First and second heart sounds are normal.  There is no gallop rub or click. Grade 2/6 aortic systolic murmur across prosthetic valve  The abdomen is soft and nontender.  Bowel sounds are normoactive.  There is no hepatosplenomegaly or mass.  There are no abdominal bruits.  Extremities reveal no phlebitis or edema.  Pedal pulses are good.  There is no cyanosis or clubbing.  Neurologic exam is normal strength and no lateralizing weakness.  No sensory deficits.  Integument reveals no rash  Lab Results:  Basename 05/09/11 0449 05/08/11 0451  WBC 9.6 8.9  HGB 7.3* 7.9*  PLT 433* 424*    Basename 05/09/11 0449 05/08/11 0451  NA 141 144  K 3.3* 3.2*  CL 110 110  CO2 21 23  GLUCOSE 97 121*  BUN 15 24*  CREATININE 1.12 1.17    Imaging: No results found.  Cardiac Studies:  ECG:  NSR QT 448  Normal ECG TELEMETRY:  NSR no afib and no VT Echo:  4/10  EF normal 60%  Normal functioning AVR with small systolic gradient Assessment/Plan:   Atrial fibrillation (04/28/2011)  Assessment:Maintaining NSR.   Plan: Continue amiodarone. S/P aortic valve replacement and CABG on 09/23/10    Assessment: Stable    Plan:Presently off ASA because of anemia and hematuria.            Lopresser 12.5 bid             Restart statin prior to discharge.    LOS: 15 days    Charlton Haws 05/10/2011, 8:19 AM

## 2011-05-10 NOTE — Progress Notes (Signed)
CSW spoke with patients spouse. Discussed need for SNF. Requesting [facility]. [facility] can accept patient. Patient can DC to [facility] tomorrow.  Axel Meas C. Kashana Breach MSW, LCSW [phone]

## 2011-05-10 NOTE — Progress Notes (Signed)
PHARMACIST - PHYSICIAN COMMUNICATION DR:   Elisabeth Pigeon CONCERNING: IV to Oral Route Change Policy  RECOMMENDATION: This patient is receiving Thiamine and Folic Acid by the intravenous route.  Based on criteria approved by the Pharmacy and Therapeutics Committee, these are being converted to the equivalent oral dose form(s).  DESCRIPTION: These criteria include:  The patient is eating (either orally or via tube) and/or has been taking other orally administered medications for a least 24 hours   If you have questions about this conversion, please contact the Pharmacy Department   []   [phone] )  [facility] PharmD, [place] Pager [phone] 05/10/2011 11:00 AM

## 2011-05-10 NOTE — Progress Notes (Signed)
SLP Cancellation Note  Treatment cancelled today due to RN reports pt agitated earlier and is now asleep after having Ativan.  Asked SLP not to wake pt.  Reports pt with poor intake but tolerance of diet.  SLP to return next date to assess for readiness for dietary advancement.  RN reports giving pt thin liquid trial today without signs of difficulty. Hopeful for continued improved swallow function.  Would  be ideal for pt to have least restrictive diet for d/c to SNF.  Thanks.    Pt is afebrile with intake documented as 33% today.    [name], MS [facility] SLP [phone]

## 2011-05-10 NOTE — Progress Notes (Signed)
Physical Therapy Treatment Patient Details Name: [name] MRN: [id number] DOB: 09-16-1926 Today's Date: 05/10/2011 Time: 7829-5621 PT Time Calculation (min): 17 min  PT Assessment / Plan / Recommendation Comments on Treatment Session  Pt did better with ambulation today only requiring minA instead of modA (from last visit) for LOB.    Follow Up Recommendations  Skilled nursing facility;Supervision/Assistance - 24 hour    Equipment Recommendations  Defer to next venue    Frequency     Plan Discharge plan remains appropriate;Frequency remains appropriate    Precautions / Restrictions Precautions Precautions: Fall Restrictions Weight Bearing Restrictions: No   Pertinent Vitals/Pain     Mobility  Bed Mobility Bed Mobility: Supine to Sit Supine to Sit: 5: Supervision;HOB elevated Details for Bed Mobility Assistance: increased time and cues for technique Transfers Transfers: Sit to Stand;Stand to Sit Sit to Stand: 4: Min assist;With upper extremity assist;From bed Stand to Sit: With upper extremity assist;To chair/3-in-1;4: Min guard Details for Transfer Assistance: pt uses LEs on back of chair to control descent, verbal cues for hand placement and forward lean Ambulation/Gait Ambulation/Gait Assistance: 4: Min assist Ambulation Distance (Feet): 200 Feet Assistive device: Rolling walker Ambulation/Gait Assistance Details: required less assist today for LOB and continues to have narrow BOS but no scissoring-like gait pattern observed today Gait Pattern: Step-through pattern;Decreased stride length;Narrow base of support Gait velocity: varies, able to increase speed    Exercises General Exercises - Lower Extremity Mini-Sqauts: AROM;Strengthening;15 reps;Seated;Standing;Other (comment);Limitations (performed from recliner) Mini Squats Limitations: pt using back of LEs on chair to assist and unable to stand in full extension, verbal cues to bring weight forward but pt  demonstrates decreased ability    PT Goals Acute Rehab PT Goals PT Goal: Sit to Stand - Progress: Progressing toward goal PT Goal: Stand to Sit - Progress: Progressing toward goal PT Goal: Ambulate - Progress: Progressing toward goal PT Goal: Perform Home Exercise Program - Progress: Progressing toward goal  Visit Information  Last PT Received On: 05/10/11 Assistance Needed: +1    Subjective Data  Subjective: "I want to go home with you."  (speaking to wife, wife needing to leave to feed dogs)   Cognition  Overall Cognitive Status: Appears within functional limits for tasks assessed/performed Arousal/Alertness: Awake/alert Orientation Level: Appears intact for tasks assessed Behavior During Session: [facility] for tasks performed    Balance     End of Session PT - End of Session Equipment Utilized During Treatment: Gait belt Activity Tolerance: Patient tolerated treatment well Patient left: in chair;with call bell/phone within reach;with chair alarm set    Rynn Markiewicz,KATHrine E 05/10/2011, 12:01 PM Pager: [phone]

## 2011-05-10 NOTE — Progress Notes (Signed)
   CARE MANAGEMENT NOTE 05/10/2011  Patient:  TEVITA, GOMER   Account Number:  [id number]  Date Initiated:  04/28/2011  Documentation initiated by:  Raiford Noble  Subjective/Objective Assessment:   pt adm with hematuria, anemia; hx of etoh, prostate cancer     Action/Plan:   from home w/ wife-- may need snf at dc- will await pt recs   Anticipated DC Date:  05/11/2011   Anticipated DC Plan:  SKILLED NURSING FACILITY  In-house referral  Clinical Social Worker      DC Planning Services  CM consult      [facility] Choice  NA   Choice offered to / List presented to:  NA   DME arranged  NA      DME agency  NA     HH arranged  NA      HH agency  NA   Status of service:  Completed, signed off Medicare Important Message given?  NA - LOS <3 / Initial given by admissions (If response is "NO", the following Medicare IM given date fields will be blank) Date Medicare IM given:   Date Additional Medicare IM given:    Discharge Disposition:  SKILLED NURSING FACILITY  Per UR Regulation:  Reviewed for med. necessity/level of care/duration of stay  If discussed at Long Length of Stay Meetings, dates discussed:   05/05/2011    Comments:  05/10/11 Andrey Hoobler RN,BSN NCM [phone] D/C SNF IN AM.  [id number] remains on the vent on full respiratory support/ PCCM to meet with family [phone] to discuss extubation of patient without reintubation.  EEG on [id number] did show encephalopathy and general abnormal study. [id number] Lorrin Mais Case Management [phone]  [id number] Davis,RN,BSN,CCM [phone] Patient remains on the vent is calmer due to sedation. 04-29-11 Raiford Noble, RN,BSN,CM [phone] Pt remains in restraints, agitated, confused. Now intubated on vent. Will follow. Likely will need snf at dc if stable.   04-28-11 Raiford Noble, RN,BSN,CM [phone] Not stable for dc at this time. May need SNF at dc. Will cont to follow.

## 2011-05-11 LAB — BASIC METABOLIC PANEL
CO2: 21 mEq/L (ref 19–32)
Calcium: 8.5 mg/dL (ref 8.4–10.5)
Chloride: 108 mEq/L (ref 96–112)
Potassium: 3.2 mEq/L — ABNORMAL LOW (ref 3.5–5.1)
Sodium: 138 mEq/L (ref 135–145)

## 2011-05-11 LAB — CBC
Platelets: 480 10*3/uL — ABNORMAL HIGH (ref 150–400)
RBC: 2.89 MIL/uL — ABNORMAL LOW (ref 4.22–5.81)
WBC: 7 10*3/uL (ref 4.0–10.5)

## 2011-05-11 LAB — GLUCOSE, CAPILLARY

## 2011-05-11 MED ORDER — FOLIC ACID 1 MG PO TABS: 1.0000 mg | ORAL_TABLET | Freq: Every day | ORAL | Status: DC

## 2011-05-11 MED ORDER — AMIODARONE HCL 200 MG PO TABS: 200.0000 mg | ORAL_TABLET | Freq: Two times a day (BID) | ORAL | Status: DC

## 2011-05-11 MED ORDER — FERROUS SULFATE 325 (65 FE) MG PO TABS: 325.0000 mg | ORAL_TABLET | Freq: Two times a day (BID) | ORAL | Status: DC

## 2011-05-11 MED ORDER — IPRATROPIUM BROMIDE 0.02 % IN SOLN: 0.5000 mg | Freq: Two times a day (BID) | RESPIRATORY_TRACT | Status: DC

## 2011-05-11 MED ORDER — ENSURE PUDDING PO PUDG: 1.0000 | Freq: Two times a day (BID) | ORAL | Status: DC

## 2011-05-11 MED ORDER — LEVALBUTEROL HCL 0.63 MG/3ML IN NEBU: 0.6300 mg | INHALATION_SOLUTION | Freq: Two times a day (BID) | RESPIRATORY_TRACT | Status: DC

## 2011-05-11 MED ORDER — ATORVASTATIN CALCIUM 20 MG PO TABS: 20.0000 mg | ORAL_TABLET | Freq: Every day | ORAL | Status: DC

## 2011-05-11 MED ORDER — HALOPERIDOL 5 MG PO TABS: 5.0000 mg | ORAL_TABLET | Freq: Two times a day (BID) | ORAL | Status: DC

## 2011-05-11 MED ORDER — POTASSIUM CHLORIDE CRYS ER 20 MEQ PO TBCR
40.0000 meq | EXTENDED_RELEASE_TABLET | Freq: Once | ORAL | Status: AC
  Administered 2011-05-11: 40 meq via ORAL
  Filled 2011-05-11: qty 2

## 2011-05-11 MED ORDER — PANTOPRAZOLE SODIUM 40 MG PO TBEC: 40.0000 mg | DELAYED_RELEASE_TABLET | Freq: Every day | ORAL | Status: DC

## 2011-05-11 MED ORDER — LORAZEPAM 1 MG PO TABS: 1.0000 mg | ORAL_TABLET | Freq: Three times a day (TID) | ORAL | Status: AC | PRN

## 2011-05-11 MED ORDER — ACETAMINOPHEN 325 MG PO TABS: 650.0000 mg | ORAL_TABLET | Freq: Once | ORAL | Status: DC

## 2011-05-11 MED ORDER — THIAMINE HCL 100 MG PO TABS: 100.0000 mg | ORAL_TABLET | Freq: Every day | ORAL | Status: DC

## 2011-05-11 MED ORDER — POTASSIUM CHLORIDE CRYS ER 20 MEQ PO TBCR: 20.0000 meq | EXTENDED_RELEASE_TABLET | Freq: Every day | ORAL | Status: DC

## 2011-05-11 NOTE — Discharge Instructions (Signed)
Respiratory Failure Respiratory failure is when the breathing (respiratory) system fails. This means your lungs are not working well. This happens when the lungs cannot take in enough oxygen or get rid of enough carbon dioxide. Acute respiratory failure may be life-threatening. If blood gasses are done, respiratory failure is present when the PaO2 value is less than 60 mm Hg while breathing air or the PaCO2 is more than 50 mm Hg. CAUSES  Any problem affecting the heart or lungs can be the cause of this. A few of the common causes are:  Chronic bronchitis and emphysema (COPD).   Blood clot going to lung.   Pulmonary edema (water in the lungs).   Collapse of a lung.   Bronchiectasis.   Any problem from muscles or bones that makes breathing difficult or nearly impossible.   Pneumonia.   Obesity.   Asthma.   Pulmonary embolism.   Adult respiratory distress syndrome.   Heart attack where heart cannot pump well enough.  SYMPTOMS  Respiratory failure commonly shows up with the following problems:  Difficulty breathing (dyspnea).   Rapid breathing (tachypnea).   Shortness of breath with even mild activity.   Bluish color of the skin (cyanosis), the skin beneath your nails, and mucous membranes (like the lining of the mouth).   Confusion and drowsiness or sleepiness.  DIAGNOSIS  This problem is often discovered by your caregiver while taking a careful history and performing a physical examination. The diagnosis of respiratory failure is then proven by doing arterial blood gas analysis. In addition to other blood work, further x-rays and specialized testing may be needed. The cause of the failure must be found early for best results and survival. TREATMENT  In severe cases, the problem may cause you to need a ventilator. A ventilator is a machine that artificially breathes for you. You will determine which treatment is best for you or your loved one by discussing this with your  caregiver. Some of the treatments used include:  Oxygen (in hospital setting or for home use).   Endotracheal tube and ventilator is used for a severe crisis. This is used to increase the amount of oxygen delivered, or to give the muscles needed for breathing a rest. Most ventilation is positive pressure ventilation. This means the breathing machine blows the lungs up much like you would blow up a balloon.  Document Released: 01/04/2005 Document Revised: 12/24/2010 Document Reviewed: 05/18/2006 Exit[employer] Patient Information 2012 [employer]. 

## 2011-05-11 NOTE — Progress Notes (Signed)
Patient ID: [name], male   DOB: 1927-01-13, 78 y.o.   MRN: [id number]   Subjective:  Mild agitation does worse when wife not around  Objective:  Vital Signs in the last 24 hours: Temp:  [97.1 F (36.2 C)-97.8 F (36.6 C)] 97.1 F (36.2 C) (04/23 0536) Pulse Rate:  [60-84] 74  (04/23 0536) Resp:  [16-18] 18  (04/23 0536) BP: (129-170)/(71-85) 167/85 mmHg (04/23 0536) SpO2:  [96 %-100 %] 99 % (04/23 0536) Weight:  [71.4 kg (157 lb 6.5 oz)] 71.4 kg (157 lb 6.5 oz) (04/23 0536)  Intake/Output from previous day: 04/22 0701 - 04/23 0700 In: 240 [P.O.:240] Out: 1075 [Urine:1075] Intake/Output from this shift:       . acetaminophen  650 mg Oral Once  . amiodarone  200 mg Oral BID  . antiseptic oral rinse  15 mL Mouth Rinse BID  . chlorhexidine  15 mL Mouth/Throat QID  . clotrimazole   Topical BID  . feeding supplement  1 Container Oral BID BM  . feeding supplement  30 mL Oral TID WC  . folic acid  1 mg Oral Daily  . insulin aspart  0-15 Units Subcutaneous Q4H  . ipratropium  0.5 mg Nebulization BID  . levalbuterol  0.63 mg Nebulization BID  . metoprolol tartrate  12.5 mg Oral BID  . pantoprazole (PROTONIX) IV  40 mg Intravenous Q24H  . potassium chloride  10 mEq Intravenous Q1 Hr x 2  . sodium chloride  3 mL Intravenous Q12H  . thiamine  100 mg Oral Daily  . DISCONTD: folic acid  1 mg Intravenous Daily  . DISCONTD: piperacillin-tazobactam (ZOSYN)  IV  3.375 g Intravenous Q8H  . DISCONTD: thiamine  100 mg Intravenous Daily      . sodium chloride 10 mL/hr at 05/06/11 2000    Physical Exam: The patient appears to be in mild distress seondary to fighting against wrist restraints  Head and neck exam reveals that the pupils are equal and reactive.  The extraocular movements are full.  There is no scleral icterus.  Mouth and pharynx are benign.  No lymphadenopathy.  No carotid bruits.  The jugular venous pressure is normal.  Thyroid is not enlarged or  tender.  Chest is clear to percussion and auscultation.  No rales or rhonchi.  Expansion of the chest is symmetrical.  Heart reveals no abnormal lift or heave.  First and second heart sounds are normal.  There is no gallop rub or click. Grade 2/6 aortic systolic murmur across prosthetic valve  The abdomen is soft and nontender.  Bowel sounds are normoactive.  There is no hepatosplenomegaly or mass.  There are no abdominal bruits.  Extremities reveal no phlebitis or edema.  Pedal pulses are good.  There is no cyanosis or clubbing.  Neurologic exam is normal strength and no lateralizing weakness.  No sensory deficits.  Integument reveals no rash  Lab Results:  Basename 05/11/11 0514 05/10/11 1129  WBC 7.0 6.9  HGB 8.2* 8.7*  PLT 480* 526*    Basename 05/11/11 0514 05/10/11 1129  NA 138 139  K 3.2* 3.3*  CL 108 108  CO2 21 21  GLUCOSE 106* 106*  BUN 12 12  CREATININE 1.07 1.08    Imaging: No results found.  Cardiac Studies:  ECG:  NSR QT 448  Normal ECG TELEMETRY:  NSR 70-80  no afib and no VT Echo:  4/10  EF normal 60%  Normal functioning AVR with  small systolic gradient Assessment/Plan:   Atrial fibrillation (04/28/2011)   Assessment:Maintaining NSR.   Plan: Continue amiodarone. S/P aortic valve replacement and CABG on 09/23/10    Assessment: Stable    Plan:Presently off ASA because of anemia and hematuria.            Lopresser 12.5 bid             Restart statin prior to discharge.  Watch anemia and supplement K  Will sign off     LOS: 16 days    Charlton Haws 05/11/2011, 7:25 AM

## 2011-05-11 NOTE — Discharge Summary (Signed)
Patient ID: [name] MRN: [id number] DOB/AGE: [birth year] 78 y.o.  Admit date: 04/25/2011 Discharge date: 05/11/2011  Primary Care Physician:  [name] Barre, MD, MD  Assessment/Plan  Principal Problem:   *Acute respiratory failure with hypoxia  - status post extubation  - respiratory status stable at this time  - albuterol and atrovent for shortness of breath can be continued at nursing home  Active Problems:   Acute blood loss anemia  - secondary to hemorrhagic cystitis  - hemoglobin 7.3  - patient has had an allergic reaction to blood transfusion which has initially caused him to go into respiratory failure and subsequent intubation - continue to monitor patient's hemoglobin  - most certainly ha can start ferrous sulfate 325 mg tablets BID  Hemorrhagic cystitis  - foley in place  - attempted void trial prior to discharge - patient may have foley in for 1 month and then follow up with urology for another void trial per Dr. Berneice Heinrich of urology  Hospital acquired PNA  - patient has completed 2 week course of antibiotics and they have been discontinued 05/10/2011  Hypokalemia  - repleted today  - patient can continue taking potassium chloride 20 meq daily but please recheck BMP regularly to ensure potassium is adequately repleted  GERD and PUD  - continue protonix daily  Confusion/ Encephalopathy secondary to alcohol abuse  - perhaps secondary to UTI versus PNA/dementia versus alcohol encephalopathy - there is no significant change in patients mental status - continue ativan 1 mg Q 8 hours PRN and haldol 5 mg BID to control agitation and anxiety  Hypertension  - BP 167/85 - continue home medications metoprolol and diovan - as per cardio will need statin on discharge which we ordered as atorvastatin 20 mg HS  E-coli UTI  - has received zosyn but this has been discontinued 05/10/2011  Atrial fibrillation  - rate controlled  - continue amiodarone and metoprolol   ARF  (acute renal failure)  - secondary to UTI sepsis  - Creatinine 1.17 <--1.13 <-- 1.39 <--1.20  - resolved at this time   Protein-calorie malnutrition, severe  - nutrition consulted  ADENOCARCINOMA, PROSTATE  - history of prostate cancer    Medication List  As of 05/11/2011 10:39 AM   TAKE these medications         acetaminophen 325 MG tablet   Commonly known as: TYLENOL   Take 2 tablets (650 mg total) by mouth once.      amiodarone 200 MG tablet   Commonly known as: PACERONE   Take 1 tablet (200 mg total) by mouth 2 (two) times daily.      atorvastatin 20 MG tablet   Commonly known as: LIPITOR   Take 1 tablet (20 mg total) by mouth daily.      clotrimazole-betamethasone cream   Commonly known as: LOTRISONE   Apply 1 application topically 2 (two) times daily as needed. For rash on/around buttocks.      cyanocobalamin 100 MCG tablet   Take 100 mcg by mouth daily.      feeding supplement Pudg   Take 1 Container by mouth 2 (two) times daily between meals.      folic acid 1 MG tablet   Commonly known as: FOLVITE   Ferrous sulfate 325 mf BID   Take 1 tablet (1 mg total) by mouth daily.      haloperidol 5 MG tablet   Commonly known as: HALDOL   Take 1 tablet (5 mg total) by  mouth 2 (two) times daily.      ipratropium 0.02 % nebulizer solution   Commonly known as: ATROVENT   Take 2.5 mLs (0.5 mg total) by nebulization 2 (two) times daily.      levalbuterol 0.63 MG/3ML nebulizer solution   Commonly known as: XOPENEX   Take 3 mLs (0.63 mg total) by nebulization 2 (two) times daily.      LORazepam 1 MG tablet   Commonly known as: ATIVAN   Take 1 tablet (1 mg total) by mouth every 8 (eight) hours as needed for anxiety.      metoprolol succinate 25 MG 24 hr tablet   Commonly known as: TOPROL-XL   Take 1 tablet (25 mg total) by mouth daily.      mulitivitamin with minerals Tabs   Take 1 tablet by mouth daily.      omeprazole 40 MG capsule   Commonly known as:  PRILOSEC   Take 40 mg by mouth daily.      pantoprazole 40 MG tablet   Commonly known as: PROTONIX   Take 1 tablet (40 mg total) by mouth daily.      thiamine 100 MG tablet   Take 1 tablet (100 mg total) by mouth daily.      valsartan 80 MG tablet   Commonly known as: DIOVAN   Take 1 tablet (80 mg total) by mouth daily.            Disposition and Follow-up:  - patient is medically stable to be discharged to SNF - spoke with patient's wife and she has verbally acknowledged that she agrees with this discharge plan, she has visited the facility and had no issues in regards to the plan of discharge   Significant Diagnostic Studies:  Dg Chest Port 1 View 05/06/2011   IMPRESSION:   Stable bibasilar atelectasis.     Brief H and P: 78 y/o with a h/o prostate cancer and hematuria approximately 2 months prior to admission. Pt started having hematuria 5 days prior to admission and this has persisted. He was seen by his Urologist and a foley catheter was placed and then changed 1 day later. Bleeding has persisted and patient has began to feel weak and thus came to the ER for further evaluation. Pt has continued to have gross hematuria while in the ER. He stated that he was having suprapubic pain which has been relieved with the placement of a 3 way foley.   Physical Exam on Discharge:  Filed Vitals:   05/10/11 1511 05/10/11 2205 05/11/11 0536 05/11/11 0740  BP: 129/71 170/74 167/85   Pulse: 60 84 74   Temp: 97.4 F (36.3 C) 97.8 F (36.6 C) 97.1 F (36.2 C)   TempSrc: Oral Oral Oral   Resp: 16 18 18    Height:      Weight:   71.4 kg (157 lb 6.5 oz)   SpO2: 100% 96% 99% 98%     Intake/Output Summary (Last 24 hours) at 05/11/11 1039 Last data filed at 05/11/11 0538  Gross per 24 hour  Intake    120 ml  Output    825 ml  Net   -705 ml    General:  no acute distress. HEENT: No bruits, no goiter. Heart: Regular rate and rhythm, without murmurs, rubs, gallops. Lungs: Clear to  auscultation bilaterally. Abdomen: Soft, nontender, nondistended, positive bowel sounds. Extremities: No clubbing cyanosis, (+1) LE edema with positive pedal pulses. Neuro: Grossly intact, nonfocal.  CBC:  Component Value Date/Time   WBC 7.0 05/11/2011 0514   HGB 8.2* 05/11/2011 0514   HCT 26.6* 05/11/2011 0514   PLT 480* 05/11/2011 0514   MCV 92.0 05/11/2011 0514   NEUTROABS 3.6 04/30/2011 0445   LYMPHSABS 0.6* 04/30/2011 0445   MONOABS 0.5 04/30/2011 0445   EOSABS 0.0 04/30/2011 0445   BASOSABS 0.0 04/30/2011 0445    Basic Metabolic Panel:    Component Value Date/Time   NA 138 05/11/2011 0514   K 3.2* 05/11/2011 0514   CL 108 05/11/2011 0514   CO2 21 05/11/2011 0514   BUN 12 05/11/2011 0514   CREATININE 1.07 05/11/2011 0514   CREATININE 1.05 01/29/2011 1708   GLUCOSE 106* 05/11/2011 0514   CALCIUM 8.5 05/11/2011 0514    Time spent on Discharge: Greater than 30 minutes  Signed: Timmi Devora 05/11/2011, 10:39 AM

## 2011-05-11 NOTE — Progress Notes (Addendum)
Patient cleared for discharge. Patient accepted at [facility]. Packet copied and placed in shadow chart. CSW called [facility] for transportation.  Yisroel Mullendore C. Josslin Sanjuan MSW, LCSW [phone]

## 2011-05-28 ENCOUNTER — Telehealth: Payer: Self-pay | Admitting: Cardiology

## 2011-05-28 MED ORDER — AMIODARONE HCL 200 MG PO TABS: 200.0000 mg | ORAL_TABLET | Freq: Two times a day (BID) | ORAL | Status: DC

## 2011-05-28 NOTE — Telephone Encounter (Signed)
Pt wife call and they have a medication question

## 2011-05-28 NOTE — Telephone Encounter (Signed)
I spoke with the pt's wife and the pt was just discharged to home yesterday from a SNF.  The pt's wife is reviewing the pt's medications and wanted to know if Amiodarone 200mg  twice a day should be continued.  I made her aware that this should be continued and she needs a Rx sent into the pharmacy.  Rx sent. She would also like to schedule an appointment with Dr Antoine Poche and this is scheduled on 06/18/11.

## 2011-05-31 ENCOUNTER — Encounter (HOSPITAL_BASED_OUTPATIENT_CLINIC_OR_DEPARTMENT_OTHER): Payer: Medicare Other | Attending: Internal Medicine

## 2011-05-31 DIAGNOSIS — Z79899 Other long term (current) drug therapy: Secondary | ICD-10-CM | POA: Insufficient documentation

## 2011-05-31 DIAGNOSIS — Y842 Radiological procedure and radiotherapy as the cause of abnormal reaction of the patient, or of later complication, without mention of misadventure at the time of the procedure: Secondary | ICD-10-CM | POA: Insufficient documentation

## 2011-05-31 DIAGNOSIS — Z8551 Personal history of malignant neoplasm of bladder: Secondary | ICD-10-CM | POA: Insufficient documentation

## 2011-05-31 DIAGNOSIS — I10 Essential (primary) hypertension: Secondary | ICD-10-CM | POA: Insufficient documentation

## 2011-05-31 DIAGNOSIS — Z954 Presence of other heart-valve replacement: Secondary | ICD-10-CM | POA: Insufficient documentation

## 2011-05-31 DIAGNOSIS — N3040 Irradiation cystitis without hematuria: Secondary | ICD-10-CM | POA: Insufficient documentation

## 2011-05-31 DIAGNOSIS — Z951 Presence of aortocoronary bypass graft: Secondary | ICD-10-CM | POA: Insufficient documentation

## 2011-05-31 DIAGNOSIS — Z8546 Personal history of malignant neoplasm of prostate: Secondary | ICD-10-CM | POA: Insufficient documentation

## 2011-05-31 DIAGNOSIS — Z8673 Personal history of transient ischemic attack (TIA), and cerebral infarction without residual deficits: Secondary | ICD-10-CM | POA: Insufficient documentation

## 2011-05-31 DIAGNOSIS — T66XXXS Radiation sickness, unspecified, sequela: Secondary | ICD-10-CM | POA: Insufficient documentation

## 2011-05-31 NOTE — Progress Notes (Signed)
[facility]  NAME:  [name], [name]                ACCOUNT NO.:  [id number]  MEDICAL RECORD NO.:  [id number]      DATE OF BIRTH:  Jun 22, 1926  PHYSICIAN:  Jonelle Sports. Ruthia Person, M.D.  VISIT DATE:  05/31/2011                                  OFFICE VISIT   HISTORY:  This 78 year old white male is seen on referral from [facility] for consideration of hyperbaric oxygen therapy for hemorrhagic cystitis related to prior radiation.  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  In association with these, he received radiation therapy. The exact timing of dose is not known to me at this point, but subsequent to which, he had no particular difficulties until recently he has developed a rather severe hemorrhagic cystitis which has failed to respond to the usual interventions on the part of the radiologist and which is thought based on their cystoscopy and so forth that it is very clearly related to radiation injury to the bladder.  This has been to the point that he is currently being considered for transfusion.  He has an indwelling Foley catheter and the urine never clears.  He has accordingly referred here for our evaluation and advice.  PAST MEDICAL HISTORY:  Notable for the urologic troubles mentioned above but also for coronary artery bypass grafting and aortic valve replacement, which were done approximately 6 years ago.  He also has a history of a TIA, has known hypertension.  MEDICATIONS:  His regular medications include: 1. Amiodarone 200 mg daily. 2. Aspirin 81 mg daily. 3. Ativan 1 mg daily. 4. Diovan 80 mg daily. 5. Ferrous sulfate 325 mg daily. 6. Folic acid 1 mg daily. 7. K-Dur 20 mEq daily. 8. Metoprolol 25 mg daily. 9. Nitrofurantoin 100 mg daily. 10.Pravastatin 80 mg daily. 11.Prevacid 30 mg daily. 12.Vitamin B12 on uncertain  schedule. 13.Vitamin D and multiple vitamin taken orally daily.  ALLERGIES:  Include DETROL TABLETS.  The patient has also completed several recent courses of antibiotics in association with this bleeding without response.  FAMILY HISTORY:  Not known in detail.  PERSONAL HISTORY:  Patient is currently married and is retired.  Smoked for 30 years, quit some 40 years ago.  Does not use alcoholic beverages. Interestingly, has in the past done deep sea diving.  Incidentally did this without any difficulty with tympanic membranes, etc.  REVIEW OF SYSTEMS:  The patient currently has been edematous since he has been anemic as result of this blood loss.  He also had an episode of acute respiratory failure, which led to a hospitalization.  In the April of this year, he just returned home from the [facility] where he had been for rehabilitation.  The patient complains of extreme weakness and little bit of mild dizziness in association with his current anemia.  As indicated, decision making is underway by the primary physician and his urologist as to intervening with transfusion at this time.  EXAMINATION:  VITAL SIGNS: Blood pressure is 170/79, pulse is 57 and regular, respirations 18, temperature 97.7.  His height is 5 feet 11 inches, his weight 155 pounds. GENERAL: He is pale, clearly looks his stated age of  78, but fortunately no immediate distress.  His mucous membranes are quite pale but adequately moist. CHEST: His chest shows distant breath sounds but is grossly clear at this time. CARDIAC: His heart tones are extremely distant.  I cannot hear an aortic valve sound. ABDOMEN: Soft, protuberant with some slight suprapubic tenderness.  He has a Foley catheter in place with a leg bag.  His extremities show 1 to 2+ edema at the ankles.  His distal pulses are faint but palpable.  Incidentally, his tympanic membranes are benign on examination today.  IMPRESSION:   Hemorrhagic cystitis secondary to remote radiation to the prostate and bladder area that apparently was done primarily in 2006.  DISPOSITION: 1. The use of hyperbaric oxygen, this condition is discussed with the     patient.  He would like to proceed.  He understands that we must     qualify him by getting his cardiovascular system situations     stabilize with assurance that there is a satisfactory ejection     fraction, and also that he has no active congestive heart failure.     Workup was underway now to see these problems if discovered are     addressed.          ______________________________ Jonelle Sports Cheryll Cockayne, M.D.     RES/MEDQ  D:  05/31/2011  T:  05/31/2011  Job:  [id number]  cc:   Lynelle Smoke I. Patsi Sears, M.D.

## 2011-06-02 ENCOUNTER — Inpatient Hospital Stay (HOSPITAL_COMMUNITY)
Admission: EM | Admit: 2011-06-02 | Discharge: 2011-06-11 | DRG: 372 | Disposition: A | Discharge status: Home Health | Payer: Medicare Other | Attending: Internal Medicine | Admitting: Internal Medicine

## 2011-06-02 ENCOUNTER — Encounter (HOSPITAL_COMMUNITY): Payer: Self-pay

## 2011-06-02 DIAGNOSIS — N3091 Cystitis, unspecified with hematuria: Secondary | ICD-10-CM

## 2011-06-02 DIAGNOSIS — T66XXXS Radiation sickness, unspecified, sequela: Secondary | ICD-10-CM

## 2011-06-02 DIAGNOSIS — Z952 Presence of prosthetic heart valve: Secondary | ICD-10-CM

## 2011-06-02 DIAGNOSIS — K651 Peritoneal abscess: Principal | ICD-10-CM | POA: Diagnosis present

## 2011-06-02 DIAGNOSIS — E875 Hyperkalemia: Secondary | ICD-10-CM | POA: Diagnosis present

## 2011-06-02 DIAGNOSIS — R310 Gross hematuria: Secondary | ICD-10-CM | POA: Diagnosis present

## 2011-06-02 DIAGNOSIS — E871 Hypo-osmolality and hyponatremia: Secondary | ICD-10-CM

## 2011-06-02 DIAGNOSIS — C61 Malignant neoplasm of prostate: Secondary | ICD-10-CM | POA: Diagnosis present

## 2011-06-02 DIAGNOSIS — E876 Hypokalemia: Secondary | ICD-10-CM | POA: Diagnosis not present

## 2011-06-02 DIAGNOSIS — R609 Edema, unspecified: Secondary | ICD-10-CM | POA: Diagnosis not present

## 2011-06-02 DIAGNOSIS — D72829 Elevated white blood cell count, unspecified: Secondary | ICD-10-CM | POA: Diagnosis present

## 2011-06-02 DIAGNOSIS — K630 Abscess of intestine: Secondary | ICD-10-CM

## 2011-06-02 DIAGNOSIS — Z923 Personal history of irradiation: Secondary | ICD-10-CM

## 2011-06-02 DIAGNOSIS — I739 Peripheral vascular disease, unspecified: Secondary | ICD-10-CM | POA: Diagnosis present

## 2011-06-02 DIAGNOSIS — I2510 Atherosclerotic heart disease of native coronary artery without angina pectoris: Secondary | ICD-10-CM | POA: Diagnosis present

## 2011-06-02 DIAGNOSIS — D649 Anemia, unspecified: Secondary | ICD-10-CM

## 2011-06-02 DIAGNOSIS — A0472 Enterocolitis due to Clostridium difficile, not specified as recurrent: Secondary | ICD-10-CM | POA: Diagnosis not present

## 2011-06-02 DIAGNOSIS — I359 Nonrheumatic aortic valve disorder, unspecified: Secondary | ICD-10-CM

## 2011-06-02 DIAGNOSIS — I4891 Unspecified atrial fibrillation: Secondary | ICD-10-CM | POA: Diagnosis present

## 2011-06-02 DIAGNOSIS — N3040 Irradiation cystitis without hematuria: Secondary | ICD-10-CM | POA: Diagnosis present

## 2011-06-02 DIAGNOSIS — I10 Essential (primary) hypertension: Secondary | ICD-10-CM | POA: Diagnosis present

## 2011-06-02 DIAGNOSIS — A498 Other bacterial infections of unspecified site: Secondary | ICD-10-CM | POA: Diagnosis present

## 2011-06-02 DIAGNOSIS — Y842 Radiological procedure and radiotherapy as the cause of abnormal reaction of the patient, or of later complication, without mention of misadventure at the time of the procedure: Secondary | ICD-10-CM | POA: Diagnosis present

## 2011-06-02 DIAGNOSIS — N3080 Other cystitis without hematuria: Secondary | ICD-10-CM

## 2011-06-02 DIAGNOSIS — D62 Acute posthemorrhagic anemia: Secondary | ICD-10-CM | POA: Diagnosis present

## 2011-06-02 DIAGNOSIS — E785 Hyperlipidemia, unspecified: Secondary | ICD-10-CM | POA: Diagnosis present

## 2011-06-02 DIAGNOSIS — Z8546 Personal history of malignant neoplasm of prostate: Secondary | ICD-10-CM

## 2011-06-02 DIAGNOSIS — N400 Enlarged prostate without lower urinary tract symptoms: Secondary | ICD-10-CM | POA: Diagnosis present

## 2011-06-02 DIAGNOSIS — F1020 Alcohol dependence, uncomplicated: Secondary | ICD-10-CM | POA: Diagnosis present

## 2011-06-02 DIAGNOSIS — Z951 Presence of aortocoronary bypass graft: Secondary | ICD-10-CM

## 2011-06-02 LAB — APTT: aPTT: 31 seconds (ref 24–37)

## 2011-06-02 LAB — CBC
Hemoglobin: 8.8 g/dL — ABNORMAL LOW (ref 13.0–17.0)
MCH: 28.7 pg (ref 26.0–34.0)
MCH: 28.4 pg (ref 26.0–34.0)
Platelets: 352 10*3/uL (ref 150–400)
RBC: 3.17 MIL/uL — ABNORMAL LOW (ref 4.22–5.81)
RBC: 3.1 MIL/uL — ABNORMAL LOW (ref 4.22–5.81)
RDW: 17.2 % — ABNORMAL HIGH (ref 11.5–15.5)
WBC: 12.3 10*3/uL — ABNORMAL HIGH (ref 4.0–10.5)

## 2011-06-02 LAB — PROTIME-INR: Prothrombin Time: 14 seconds (ref 11.6–15.2)

## 2011-06-02 LAB — DIFFERENTIAL
Basophils Absolute: 0 10*3/uL (ref 0.0–0.1)
Basophils Relative: 0 % (ref 0–1)
Eosinophils Absolute: 0.1 10*3/uL (ref 0.0–0.7)
Lymphs Abs: 1.1 10*3/uL (ref 0.7–4.0)
Neutrophils Relative %: 80 % — ABNORMAL HIGH (ref 43–77)

## 2011-06-02 LAB — BASIC METABOLIC PANEL
GFR calc Af Amer: >90 mL/min (ref >90)
GFR calc non Af Amer: 78 mL/min — ABNORMAL LOW (ref >90)
Glucose, Bld: 87 mg/dL (ref 70–99)
Potassium: 6.1 mEq/L — ABNORMAL HIGH (ref 3.5–5.1)
Sodium: 128 mEq/L — ABNORMAL LOW (ref 135–145)

## 2011-06-02 LAB — POTASSIUM: Potassium: 4.1 mEq/L (ref 3.5–5.1)

## 2011-06-02 MED ORDER — MORPHINE SULFATE 2 MG/ML IJ SOLN: 0.5000 mg | INTRAMUSCULAR | Status: DC | PRN

## 2011-06-02 MED ORDER — VITAMIN B-1 100 MG PO TABS
100.0000 mg | ORAL_TABLET | Freq: Every day | ORAL | Status: DC
  Administered 2011-06-02 – 2011-06-11 (×10): 100 mg via ORAL
  Filled 2011-06-02 (×10): qty 1

## 2011-06-02 MED ORDER — IRBESARTAN 75 MG PO TABS
75.0000 mg | ORAL_TABLET | Freq: Every day | ORAL | Status: DC
  Administered 2011-06-03 – 2011-06-11 (×9): 75 mg via ORAL
  Filled 2011-06-02 (×9): qty 1

## 2011-06-02 MED ORDER — SIMVASTATIN 40 MG PO TABS
40.0000 mg | ORAL_TABLET | Freq: Every day | ORAL | Status: DC
  Administered 2011-06-02 – 2011-06-03 (×2): 40 mg via ORAL
  Filled 2011-06-02 (×3): qty 1

## 2011-06-02 MED ORDER — CIPROFLOXACIN IN D5W 400 MG/200ML IV SOLN
400.0000 mg | Freq: Once | INTRAVENOUS | Status: AC
  Administered 2011-06-02: 400 mg via INTRAVENOUS
  Filled 2011-06-02: qty 200

## 2011-06-02 MED ORDER — SODIUM CHLORIDE 0.9 % IV BOLUS (SEPSIS)
500.0000 mL | Freq: Once | INTRAVENOUS | Status: AC
  Administered 2011-06-02: 16:00:00 via INTRAVENOUS

## 2011-06-02 MED ORDER — FERROUS SULFATE 325 (65 FE) MG PO TABS
325.0000 mg | ORAL_TABLET | Freq: Three times a day (TID) | ORAL | Status: DC
  Administered 2011-06-03 – 2011-06-11 (×25): 325 mg via ORAL
  Filled 2011-06-02 (×29): qty 1

## 2011-06-02 MED ORDER — SODIUM CHLORIDE 0.9 % IJ SOLN
3.0000 mL | Freq: Two times a day (BID) | INTRAMUSCULAR | Status: DC
  Administered 2011-06-02 – 2011-06-11 (×15): 3 mL via INTRAVENOUS

## 2011-06-02 MED ORDER — FOLIC ACID 1 MG PO TABS: 1.0000 mg | ORAL_TABLET | Freq: Every day | ORAL | Status: DC

## 2011-06-02 MED ORDER — METOPROLOL SUCCINATE ER 25 MG PO TB24
25.0000 mg | ORAL_TABLET | Freq: Every evening | ORAL | Status: DC
  Administered 2011-06-02 – 2011-06-06 (×4): 25 mg via ORAL
  Filled 2011-06-02 (×6): qty 1

## 2011-06-02 MED ORDER — THIAMINE HCL 100 MG/ML IJ SOLN: 100.0000 mg | Freq: Every day | INTRAMUSCULAR | Status: DC

## 2011-06-02 MED ORDER — FUROSEMIDE 10 MG/ML IJ SOLN
40.0000 mg | Freq: Once | INTRAMUSCULAR | Status: AC
  Administered 2011-06-02: 40 mg via INTRAVENOUS
  Filled 2011-06-02: qty 4

## 2011-06-02 MED ORDER — LORAZEPAM 2 MG/ML IJ SOLN: 1.0000 mg | Freq: Four times a day (QID) | INTRAMUSCULAR | Status: AC | PRN

## 2011-06-02 MED ORDER — SODIUM CHLORIDE 0.9 % IR SOLN
Status: DC
  Administered 2011-06-02 – 2011-06-04 (×2): via INTRAVESICAL
  Filled 2011-06-02 (×8): qty 40

## 2011-06-02 MED ORDER — METRONIDAZOLE IN NACL 5-0.79 MG/ML-% IV SOLN
500.0000 mg | Freq: Three times a day (TID) | INTRAVENOUS | Status: DC
  Administered 2011-06-03 (×2): 500 mg via INTRAVENOUS
  Filled 2011-06-02 (×3): qty 100

## 2011-06-02 MED ORDER — SODIUM CHLORIDE 0.9 % IV SOLN: 250.0000 mL | INTRAVENOUS | Status: DC | PRN

## 2011-06-02 MED ORDER — SODIUM CHLORIDE 0.9 % IJ SOLN: 3.0000 mL | INTRAMUSCULAR | Status: DC | PRN

## 2011-06-02 MED ORDER — AMIODARONE HCL 200 MG PO TABS
200.0000 mg | ORAL_TABLET | Freq: Two times a day (BID) | ORAL | Status: DC
  Administered 2011-06-02 – 2011-06-11 (×16): 200 mg via ORAL
  Filled 2011-06-02 (×19): qty 1

## 2011-06-02 MED ORDER — LORAZEPAM 1 MG PO TABS: 1.0000 mg | ORAL_TABLET | Freq: Four times a day (QID) | ORAL | Status: AC | PRN

## 2011-06-02 MED ORDER — METRONIDAZOLE IN NACL 5-0.79 MG/ML-% IV SOLN
500.0000 mg | Freq: Once | INTRAVENOUS | Status: AC
  Administered 2011-06-02: 500 mg via INTRAVENOUS
  Filled 2011-06-02: qty 100

## 2011-06-02 MED ORDER — CIPROFLOXACIN IN D5W 400 MG/200ML IV SOLN
400.0000 mg | Freq: Two times a day (BID) | INTRAVENOUS | Status: DC
  Administered 2011-06-03: 400 mg via INTRAVENOUS
  Filled 2011-06-02 (×2): qty 200

## 2011-06-02 MED ORDER — HYDROCODONE-ACETAMINOPHEN 5-325 MG PO TABS: 1.0000 | ORAL_TABLET | ORAL | Status: DC | PRN

## 2011-06-02 MED ORDER — PANTOPRAZOLE SODIUM 40 MG PO TBEC
40.0000 mg | DELAYED_RELEASE_TABLET | Freq: Every day | ORAL | Status: DC
  Administered 2011-06-02 – 2011-06-11 (×10): 40 mg via ORAL
  Filled 2011-06-02 (×10): qty 1

## 2011-06-02 MED ORDER — ADULT MULTIVITAMIN W/MINERALS CH
1.0000 | ORAL_TABLET | Freq: Every day | ORAL | Status: DC
  Administered 2011-06-02 – 2011-06-11 (×10): 1 via ORAL
  Filled 2011-06-02 (×9): qty 1

## 2011-06-02 MED ORDER — FOLIC ACID 1 MG PO TABS
1.0000 mg | ORAL_TABLET | Freq: Every day | ORAL | Status: DC
  Administered 2011-06-02 – 2011-06-11 (×10): 1 mg via ORAL
  Filled 2011-06-02 (×10): qty 1

## 2011-06-02 MED ORDER — VITAMIN B-1 100 MG PO TABS: 100.0000 mg | ORAL_TABLET | Freq: Every day | ORAL | Status: DC

## 2011-06-02 MED ORDER — VITAMIN B-12 100 MCG PO TABS: 100.0000 ug | ORAL_TABLET | Freq: Every day | ORAL | Status: DC

## 2011-06-02 MED ORDER — SODIUM CHLORIDE 0.9 % IV SOLN: Freq: Once | INTRAVENOUS | Status: DC

## 2011-06-02 NOTE — ED Notes (Signed)
Attempted to call report. Floor RN unable to accept report.  

## 2011-06-02 NOTE — Consult Note (Signed)
Patient seen and examined.  The rim enhancing fluid collection could be free the GU or GI tract.  Will know more after IR drains the collection tomorrow.

## 2011-06-02 NOTE — ED Notes (Signed)
MD Ignacia Palma, [place] Hunt at bedside.

## 2011-06-02 NOTE — ED Notes (Signed)
Patient here due to foley catheter leaking. Catheter was just replaced 2 days ago and leaking.

## 2011-06-02 NOTE — ED Notes (Signed)
Notified Dr. Pamala Hurry pt will be sitting in the ED for a long time due to lack of beds in the step down. Dr. Pamala Hurry requested pt's bed be changed to a tele bed. Bed control notified. Waiting on pt's new bed assignment

## 2011-06-02 NOTE — ED Provider Notes (Signed)
Medical screening examination/treatment/procedure(s) were conducted as a shared visit with non-physician practitioner(s) and myself.  I personally evaluated the patient during the encounter 79 year old man with hemorrhagic cystitis, whose catheter is leaking thin bloody urine, despite his using a penile clamp.  Dr. Jethro Bolus called in to say that pt has a diverticular abscess that is contiguous to his urinary bladder.  Dr. Patsi Sears will be in to insert a #24 triple lumen urethral catheter.  Pt to be admitted, probably will need to have percutaneous drainage of his diverticular abscess.   Carleene Cooper III, MD 06/02/11 2056

## 2011-06-02 NOTE — ED Provider Notes (Signed)
History     CSN: [id number]  Arrival date & time 06/02/11  1317   First MD Initiated Contact with Patient 06/02/11 1330      Chief Complaint  Patient presents with  . foley catheter leaking     (Consider location/radiation/quality/duration/timing/severity/associated sxs/prior treatment) HPI  Patient has history of complicated hemorrhagic cystitis status post radiation therapy to his bladder who is followed by Dr. Patsi Sears with urology presents to the emergency department with his wife at bedside with complaint of leaking Foley catheter as well as ongoing persistent hemorrhagic cystitis producing hematuria. Patient is also complaining of generalized increasing weakness. Patient was seen by Dr. Patsi Sears 2 days ago to have Foley catheter replaced however has been having ongoing leaking the Foley catheter since insertion. Patient also is complaining of mild abdominal pain but only with bowel movements stating no current abdominal pain and no bowel pain at rest. He denies fevers or chills, he denies chest pain or shortness of breath.  Additional history was obtained by Dr. Patsi Sears who I consulted to address leaking foley catheter and ongoing hemorrhagic cystitis. Dr. Patsi Sears reports that patient has a very complicated history with recent hospitalizations due to hemorrhagic cystitis. He states that he is currently continuing to work on patient's hemorrhagic cystitis as an outpatient however had ordered an outpatient CT scan which shows emergent findings of a pericolonic diverticular abscess. He states he's been attempting to call the patient all morning.  Past Medical History  Diagnosis Date  . ADENOCARCINOMA, PROSTATE   . ALLERGIC RHINITIS   . B12 DEFICIENCY   . BENIGN PROSTATIC HYPERTROPHY   . CAROTID ARTERY DISEASE   . CEREBROVASCULAR ACCIDENT, HX OF   . CORONARY ARTERY DISEASE   . DIVERTICULOSIS, COLON   . GERD   . HEMORRHOIDS   . HIATAL HERNIA   . HYPERLIPIDEMIA   .  HYPERTENSION   . OSTEOPENIA   . PEPTIC ULCER DISEASE   . PERIPHERAL VASCULAR DISEASE   . Personal history of alcoholism   . PROSTATE CANCER, HX OF   . PUD, HX OF   . TRANSIENT ISCHEMIC ATTACK   . Shingles   . Anemia   . AS (aortic stenosis)   . S/P AVR (aortic valve replacement) 09/23/2010  . Complication of anesthesia 09/23/2010    very confused after waking up. Stopped drinking 40 days before this surgery.  . Transfusion reaction, chill fever type 04/28/2011    Fever, rigors, tachycardia,tachypnea but no evidence of hemolysis  . Hemorrhagic cystitis 04/28/2011    Due to previous radiation for PSA recurrence of prostate cancer    Past Surgical History  Procedure Date  . Popliteal synovial cyst excision   . Right carotid endarterectomy 1999    Left carotid total chronic occlusion  . Prostate surgery   . Cardic stent 2004  . Aortic valve replacement 09/23/2010    #35mm [facility] Ease pericardial tissue valve  . Coronary artery bypass graft 09/23/2010    CABG x1 using LIMA to LAD  . Cystoscopy with biopsy 02/15/2011    Procedure: CYSTOSCOPY WITH BIOPSY;  Surgeon: Anner Crete, MD;  Location: [facility];  Service: Urology;  Laterality: N/A;  Cystoscopy, biopsy and fulguration of the bladder    Family History  Problem Relation Age of Onset  . Colon cancer Mother 84  . Diabetes Sister   . Hypertension Sister   . Coronary artery disease Sister   . Multiple sclerosis Sister   . Stroke Brother  History  Substance Use Topics  . Smoking status: Former Smoker    Quit date: 02/14/1958  . Smokeless tobacco: Not on file  . Alcohol Use: 21.0 oz/week    7 Glasses of wine, 28 Cans of beer per week      Review of Systems  All other systems reviewed and are negative.    Allergies  Tolterodine tartrate  Home Medications   Current Outpatient Rx  Name Route Sig Dispense Refill  . AMIODARONE HCL 200 MG PO TABS Oral Take 200 mg by mouth 2 (two) times daily.    . ASPIRIN 81 MG  PO TABS Oral Take 81 mg by mouth daily.    . ATORVASTATIN CALCIUM 20 MG PO TABS Oral Take 1 tablet (20 mg total) by mouth daily. 90 tablet 2  . FERROUS SULFATE 325 (65 FE) MG PO TABS Oral Take 1 tablet (325 mg total) by mouth 2 (two) times daily. 60 tablet 11  . FOLIC ACID 1 MG PO TABS Oral Take 1 tablet (1 mg total) by mouth daily. 30 tablet 0  . HALOPERIDOL 5 MG PO TABS Oral Take 1 tablet (5 mg total) by mouth 2 (two) times daily. 60 tablet 2  . METOPROLOL SUCCINATE ER 25 MG PO TB24 Oral Take 1 tablet (25 mg total) by mouth daily. 90 tablet 2  . ADULT MULTIVITAMIN W/MINERALS CH Oral Take 1 tablet by mouth daily.    Marland Kitchen OMEPRAZOLE 40 MG PO CPDR Oral Take 40 mg by mouth daily.      Marland Kitchen POTASSIUM CHLORIDE CRYS ER 20 MEQ PO TBCR Oral Take 20 mEq by mouth daily.    . THIAMINE HCL 100 MG PO TABS Oral Take 1 tablet (100 mg total) by mouth daily. 30 tablet 0  . VALSARTAN 80 MG PO TABS Oral Take 1 tablet (80 mg total) by mouth daily. 90 tablet 2  . SWEEN EX CREA Topical Apply 1 application topically daily.      BP 131/59  Pulse 55  Temp(Src) 98.1 F (36.7 C) (Oral)  Resp 16  SpO2 97%  Physical Exam  Nursing note and vitals reviewed. Constitutional: He is oriented to person, place, and time. He appears well-developed and well-nourished. No distress.  HENT:  Head: Normocephalic and atraumatic.       Pale mucus membranes and conjunctiva  Eyes: Conjunctivae are normal.  Neck: Normal range of motion. Neck supple.  Cardiovascular: Normal rate, regular rhythm, normal heart sounds and intact distal pulses.  Exam reveals no gallop and no friction rub.   No murmur heard. Pulmonary/Chest: Effort normal and breath sounds normal. No respiratory distress. He has no wheezes. He has no rales. He exhibits no tenderness.  Abdominal: Soft. Bowel sounds are normal. He exhibits no distension and no mass. There is tenderness. There is no rebound and no guarding.       Mild TTP of LLQ no rebound or rigidity.     Genitourinary:       Gross thin hematuria in foley leg bag with continuous streaming leaking blood tinged urine around urethral meatus at foley catheter   Musculoskeletal: Normal range of motion. He exhibits no edema and no tenderness.  Neurological: He is alert and oriented to person, place, and time.  Skin: Skin is warm and dry. No rash noted. He is not diaphoretic. No erythema.  Psychiatric: He has a normal mood and affect.    ED Course  Procedures (including critical care time)  Patient has IV started with Cipro  and Flagyl as well as IV fluids. We will continue monitor him on a cardiac monitor with continuous pulse ox. Patient is complaining of only pain in his abdomen with bowel movement however says he has no pain while resting in bed. Dr. Patsi Sears called to give the result of an outpatient CT scan which revealed a 6.1 cm pericolonic diverticular abscess that is pressing on the bladder stating that he has been attempting to contact the patient and his wife all morning to tell them to come to the ER. He will come to the ER to place a three-way 24 Foley catheter to deal with ongoing leaking hemorrhagic cystitis. He is requesting general surgery as well as interventional radiology consult for further management of pericolonic abscess. Patient will be admitted to general medicine given his multiple medical problems. Dr. Patsi Sears reports that the hemoglobin that resulted this morning at 9 AM shows a hemoglobin of 8.7, a creatinine of 0.88, and a GFR of 79. Pending labs in ER pending.  CRITICAL CARE Performed by: Drucie Opitz   Total critical care time: 29  Critical care time was exclusive of separately billable procedures and treating other patients.  Critical care was necessary to treat or prevent imminent or life-threatening deterioration.  Critical care was time spent personally by me on the following activities: development of treatment plan with patient and/or surrogate as well as  nursing, discussions with consultants, evaluation of patient's response to treatment, examination of patient, obtaining history from patient or surrogate, ordering and performing treatments and interventions, ordering and review of laboratory studies, ordering and review of radiographic studies, pulse oximetry and re-evaluation of patient's condition.  4:35 PM Dr Patsi Sears has consulted IR. I spoke with Will PA-C for gen surg who will see patient in ER as consult. Dr. Cena Benton will admit patient to [facility].   Labs Reviewed  CBC - Abnormal; Notable for the following:    WBC 12.3 (*)    RBC 3.17 (*)    Hemoglobin 9.1 (*)    HCT 28.3 (*)    RDW 17.2 (*)    All other components within normal limits  DIFFERENTIAL - Abnormal; Notable for the following:    Neutrophils Relative 80 (*)    Neutro Abs 9.8 (*)    Lymphocytes Relative 9 (*)    Monocytes Absolute 1.3 (*)    All other components within normal limits  BASIC METABOLIC PANEL - Abnormal; Notable for the following:    Sodium 128 (*)    Potassium 6.1 (*) MODERATE HEMOLYSIS   Calcium 8.3 (*)    GFR calc non Af Amer 78 (*)    All other components within normal limits  PROTIME-INR  APTT  TYPE AND SCREEN   No results found.   1. Pericolonic abscess   2. Hemorrhagic cystitis   3. Hyperkalemia   4. Anemia       MDM  Continuing to monitor VS while in ER. IV abx and fluids started. Patient is not at level of requiring a transfusion at this time.         Steward, [place] 06/02/11 [phone]

## 2011-06-02 NOTE — Consult Note (Signed)
Reason for Consult: Intra abdominal abscess Referring Physician: Gilman Olazabal is an 78 y.o. male.  HPI: Patient's an 78 year old gentleman who's been strongly ill over the last year. He was hospitalized for 7 through 05/11/2011 with acute respiratory failure and acute anemia secondary to hemorrhagic cystitis. He's had multiple medical issues as listed below and was just discharged from a skilled nursing facility last week. He's had ongoing issues with hematuria and leakage around the Foley. He says it makes seen feel weak and tired. He came to the ER initially just for this issue, in hopes of getting a larger Foley to control this leakage. A CT scan was obtained by Dr. Patsi Sears. CT scan shows no evidence of a bowel obstruction but he does have a large duodenal diverticulum with a 6.1 cm fluid collection/abscess in the pelvis which they suspect is communicating with the anterior wall of the distal sigmoid colon/rectum. He also has suspected layering hemorrhage in the bladder with an indwelling Foley catheter. We were asked to see in consultation for the suspected diverticular/intra-abdominal abscess. Interventional radiology has all ready been contacted and they plan to follow. He has no prior history of diverticulosis or diverticulitis. He is undergone colonoscopies by Dr. [name] in the past.  Past Medical History  Diagnosis: Recent hospitalization with acute respiratory failure multiple medical issues including hemorrhagic cystitis, CHF. Status post aVR, September 2012 Dr. Clarnce Flock. Hochrein Atrial fibrillation rate controlled with metoprolol and amiodarone. Adenocarcinoma of the prostate with prostatectomy. History of alcohol abuse with encephalopathy History of CVA with occluded left carotid and status post right carotid endarterectomy.  Date  . ADENOCARCINOMA, PROSTATE/hemorrhagic cystitis    . ALLERGIC RHINITIS   . B12 DEFICIENCY   . BENIGN PROSTATIC HYPERTROPHY   .  CAROTID ARTERY DISEASE   . CEREBROVASCULAR ACCIDENT, HX OF   . CORONARY ARTERY DISEASE   . DIVERTICULOSIS, COLON   . GERD   . HEMORRHOIDS   . HIATAL HERNIA   . HYPERLIPIDEMIA   . HYPERTENSION   . OSTEOPENIA   . PEPTIC ULCER DISEASE   . PERIPHERAL VASCULAR DISEASE   . Personal history of alcoholism   . PROSTATE CANCER, HX OF   . PUD, HX OF   . TRANSIENT ISCHEMIC ATTACK   . Shingles   . Anemia   . AS (aortic stenosis)   . S/P AVR (aortic valve replacement) 09/23/2010  . Complication of anesthesia 09/23/2010    very confused after waking up. Stopped drinking 40 days before this surgery.  . Transfusion reaction, chill fever type 04/28/2011    Fever, rigors, tachycardia,tachypnea but no evidence of hemolysis  . Hemorrhagic cystitis 04/28/2011    Due to previous radiation for PSA recurrence of prostate cancer    Past Surgical History  Procedure Date  . Popliteal synovial cyst excision   . Right carotid endarterectomy 1999    Left carotid total chronic occlusion  . Prostate surgery   . Cardic stent 2004  . Aortic valve replacement 09/23/2010    #37mm [facility] Ease pericardial tissue valve  . Coronary artery bypass graft 09/23/2010    CABG x1 using LIMA to LAD  . Cystoscopy with biopsy 02/15/2011    Procedure: CYSTOSCOPY WITH BIOPSY;  Surgeon: Anner Crete, MD;  Location: [facility];  Service: Urology;  Laterality: N/A;  Cystoscopy, biopsy and fulguration of the bladder    Family History  Problem Relation Age of Onset  . Colon cancer Mother 43  . Diabetes Sister   .  Hypertension Sister   . Coronary artery disease Sister   . Multiple sclerosis Sister   . Stroke Brother     Social History:  reports that he quit smoking about 53 years ago. He has never used smokeless tobacco. He reports that he does not drink alcohol or use illicit drugs.  Allergies:  Allergies  Allergen Reactions  . Tolterodine Tartrate     Medications:  Prior to Admission:  (Not in a hospital  admission) Scheduled:   . sodium chloride   Intravenous Once  . ciprofloxacin  400 mg Intravenous Once  . furosemide  40 mg Intravenous Once  . metronidazole  500 mg Intravenous Once  . sodium chloride  500 mL Intravenous Once   Continuous:  PRN: Anti-infectives     Start     Dose/Rate Route Frequency Ordered Stop   06/02/11 1515   ciprofloxacin (CIPRO) IVPB 400 mg        400 mg 200 mL/hr over 60 Minutes Intravenous  Once 06/02/11 1514 06/02/11 1639   06/02/11 1515   metroNIDAZOLE (FLAGYL) IVPB 500 mg        500 mg 100 mL/hr over 60 Minutes Intravenous  Once 06/02/11 1514 06/02/11 1639          Results for orders placed during the hospital encounter of 06/02/11 (from the past 48 hour(s))  CBC     Status: Abnormal   Collection Time   06/02/11  3:00 PM      Component Value Range Comment   WBC 12.3 (*) 4.0 - 10.5 (K/uL)    RBC 3.17 (*) 4.22 - 5.81 (MIL/uL)    Hemoglobin 9.1 (*) 13.0 - 17.0 (g/dL)    HCT 40.9 (*) 81.1 - 52.0 (%)    MCV 89.3  78.0 - 100.0 (fL)    MCH 28.7  26.0 - 34.0 (pg)    MCHC 32.2  30.0 - 36.0 (g/dL)    RDW 91.4 (*) 78.2 - 15.5 (%)    Platelets 352  150 - 400 (K/uL)   DIFFERENTIAL     Status: Abnormal   Collection Time   06/02/11  3:00 PM      Component Value Range Comment   Neutrophils Relative 80 (*) 43 - 77 (%)    Neutro Abs 9.8 (*) 1.7 - 7.7 (K/uL)    Lymphocytes Relative 9 (*) 12 - 46 (%)    Lymphs Abs 1.1  0.7 - 4.0 (K/uL)    Monocytes Relative 11  3 - 12 (%)    Monocytes Absolute 1.3 (*) 0.1 - 1.0 (K/uL)    Eosinophils Relative 0  0 - 5 (%)    Eosinophils Absolute 0.1  0.0 - 0.7 (K/uL)    Basophils Relative 0  0 - 1 (%)    Basophils Absolute 0.0  0.0 - 0.1 (K/uL)   BASIC METABOLIC PANEL     Status: Abnormal   Collection Time   06/02/11  3:00 PM      Component Value Range Comment   Sodium 128 (*) 135 - 145 (mEq/L)    Potassium 6.1 (*) 3.5 - 5.1 (mEq/L) MODERATE HEMOLYSIS   Chloride 96  96 - 112 (mEq/L)    CO2 24  19 - 32 (mEq/L)     Glucose, Bld 87  70 - 99 (mg/dL)    BUN 15  6 - 23 (mg/dL)    Creatinine, Ser 9.56  0.50 - 1.35 (mg/dL)    Calcium 8.3 (*) 8.4 - 10.5 (mg/dL)  GFR calc non Af Amer 78 (*) >90 (mL/min)    GFR calc Af Amer >90  >90 (mL/min)   PROTIME-INR     Status: Normal   Collection Time   06/02/11  3:00 PM      Component Value Range Comment   Prothrombin Time 14.0  11.6 - 15.2 (seconds)    INR 1.06  0.00 - 1.49    APTT     Status: Normal   Collection Time   06/02/11  3:00 PM      Component Value Range Comment   aPTT 31  24 - 37 (seconds)     No results found.  Review of Systems  Constitutional: Positive for weight loss and malaise/fatigue. Negative for fever, chills and diaphoresis.  HENT: Negative.   Eyes: Negative.   Respiratory: Negative.   Cardiovascular: Positive for leg swelling (Both legs, chronic).  Gastrointestinal: Positive for diarrhea (he reports when he get some discomfort in his abdomen, he has instant diarrhea, and cannot hold it .). Negative for heartburn, nausea, vomiting, abdominal pain, constipation, blood in stool and melena.  Genitourinary:       He has a foley and a clamp with on going hematuria.  He came in to get some help with the leaking of blood and urine around his catheter.  Musculoskeletal: Negative.   Skin: Negative.   Neurological: Positive for weakness. Negative for dizziness, tingling, tremors, sensory change, speech change, focal weakness, seizures and loss of consciousness.       His wife said he was easily confused last admission with sedation for his vent.  Endo/Heme/Allergies: Negative.   Psychiatric/Behavioral: Negative.    Blood pressure 134/66, pulse 68, temperature 98.1 F (36.7 C), temperature source Oral, resp. rate 18, SpO2 100.00%. Physical Exam  Constitutional: He is oriented to person, place, and time. No distress.       Frail elderly WM, NAD  HENT:  Head: Normocephalic and atraumatic.  Nose: Nose normal.  Eyes: Conjunctivae and EOM are  normal. Pupils are equal, round, and reactive to light. Right eye exhibits no discharge. Left eye exhibits no discharge. No scleral icterus.  Neck: Normal range of motion. Neck supple. No JVD present. No tracheal deviation present. No thyromegaly present.       Right CE scar.  Cardiovascular: Normal rate, regular rhythm and intact distal pulses.  Exam reveals no gallop and no friction rub.   Murmur (II/VI SEM all positions) heard. Respiratory: Effort normal and breath sounds normal. No stridor. No respiratory distress. He has no wheezes. He has no rales. He exhibits no tenderness.  GI: Soft. Bowel sounds are normal. He exhibits no distension and no mass. There is no tenderness. There is no rebound and no guarding.  Genitourinary:       He has a foley and a clamp across his penis.  He is leaking urine and has hematuria.  Musculoskeletal: He exhibits edema (Both lower legs with +2 edema). He exhibits no tenderness.  Lymphadenopathy:    He has no cervical adenopathy.  Neurological: He is alert and oriented to person, place, and time. No cranial nerve deficit.  Skin: Skin is warm and dry. No rash noted. He is not diaphoretic. No erythema.  Psychiatric: He has a normal mood and affect. His behavior is normal. Judgment and thought content normal.    Assessment/Plan: 1. Intra-abdominal abscess by CT. No prior history of diverticulitis or diverticulosis. 2. History of prostate cancer, status post prostatectomy, radiation therapy, hemorrhagic cystitis, ongoing leakage  around his foley. 3. Recent hospitalization with hypoxia and acute respiratory failure requiring ventilator support. 4. S/P aortic valve replacement, September 2012 5. Atrial fibrillation controlled with metoprolol and amiodarone. 6. Recent congestive heart failure 7. History of alcohol use and encephalopathy, recurrent DT'S 8. History of stroke with occluded left carotid, status post right carotid  endarterectomy. 9.Hyperkalemia 10.Hyponatremia  Plan: He is scheduled for interventional radiology tomorrow. His abdominal exam and his history are not consistent with diverticulitis. We will follow with you, Dr. Abbey Chatters refused studies with further recommendations also. [facility] physician assistant for Dr. Avel Peace.  Meyer Dockery 06/02/2011, 5:32 PM

## 2011-06-02 NOTE — H&P (Signed)
PCP:   Oliver Barre, MD, MD   Chief Complaint:  Hematuria  HPI: Pt is an 78 y/o with multiple history of hemorrhagic cystitis.  He is s/p radiation therapy to his bladder from history of prostate cancer.  Dr. Patsi Sears is his urologist and has been following the patient.  Reportedly patient had some cauterization to his bladder which helped stop the bleeding until 3 weeks ago when the patient endorses hematuria.  Despite seeing his urologist and getting his foley changed he has still had hematuria and reports that he is starting to feel weaker.    As outpatient work-up his urologist ordered a CT of his abdomen which revealed a 6.1x6.1 cm thick -walled collection/abscess in the pelvis with suspected communication arising from the anterior wall of the distal sigmoid/rectum.    While in the ED patient was noted to have continued hematuria.  They consulted the patient's urologist Dr. Patsi Sears, interventional radiology, and general surgery.  In the ED patient was found to have a wbc of 12.3 with a h/h of 9.1/28.3. CBC on 4/23 was 8.2/26.6.  Patient was also found to have an elevated potassium level at 6.1 and was given lasix while at the ED.  Was also started on IV cipro and flagyl while in the ED.  Last month patient required transfusion due to similar presentation.  While given a unit of blood he developed some altered mental status and difficulty breathing.  Heme/onc was consulted and their note on 04/27/11 by Dr. Cyndie Chime outlines his assessment of the situation.  Should the patient require transfusion would take his prior recommendation  Of:   "Recommendation:  I think it is safe to proceed with additional transfusion if needed. I will order premedication to include Tylenol 650 mg by mouth, Benadryl 50 mg by mouth, Demerol 25 mg IV, and Solu-Medrol 60 mg IV.  Discussed with critical care attending and patient's wife."  Will plan on admitting for further evaluation and management given his  multiple acute medical problems.  Allergies:   Allergies  Allergen Reactions  . Tolterodine Tartrate       Past Medical History  Diagnosis Date  . ADENOCARCINOMA, PROSTATE   . ALLERGIC RHINITIS   . B12 DEFICIENCY   . BENIGN PROSTATIC HYPERTROPHY   . CAROTID ARTERY DISEASE   . CEREBROVASCULAR ACCIDENT, HX OF   . CORONARY ARTERY DISEASE   . DIVERTICULOSIS, COLON   . GERD   . HEMORRHOIDS   . HIATAL HERNIA   . HYPERLIPIDEMIA   . HYPERTENSION   . OSTEOPENIA   . PEPTIC ULCER DISEASE   . PERIPHERAL VASCULAR DISEASE   . Personal history of alcoholism   . PROSTATE CANCER, HX OF   . PUD, HX OF   . TRANSIENT ISCHEMIC ATTACK   . Shingles   . Anemia   . AS (aortic stenosis)   . S/P AVR (aortic valve replacement) 09/23/2010  . Complication of anesthesia 09/23/2010    very confused after waking up. Stopped drinking 40 days before this surgery.  . Transfusion reaction, chill fever type 04/28/2011    Fever, rigors, tachycardia,tachypnea but no evidence of hemolysis  . Hemorrhagic cystitis 04/28/2011    Due to previous radiation for PSA recurrence of prostate cancer    Past Surgical History  Procedure Date  . Popliteal synovial cyst excision   . Right carotid endarterectomy 1999    Left carotid total chronic occlusion  . Prostate surgery   . Cardic stent 2004  . Aortic valve  replacement 09/23/2010    #81mm [facility] Ease pericardial tissue valve  . Coronary artery bypass graft 09/23/2010    CABG x1 using LIMA to LAD  . Cystoscopy with biopsy 02/15/2011    Procedure: CYSTOSCOPY WITH BIOPSY;  Surgeon: Anner Crete, MD;  Location: [facility];  Service: Urology;  Laterality: N/A;  Cystoscopy, biopsy and fulguration of the bladder    Prior to Admission medications   Medication Sig Start Date End Date Taking? Authorizing Provider  amiodarone (PACERONE) 200 MG tablet Take 200 mg by mouth 2 (two) times daily. 05/28/11 05/27/12 Yes Rollene Rotunda, MD  aspirin 81 MG tablet Take 81 mg by  mouth daily.   Yes Historical Provider, MD  atorvastatin (LIPITOR) 20 MG tablet Take 1 tablet (20 mg total) by mouth daily. 02/18/11  Yes Corwin Levins, MD  ferrous sulfate (FERROUSUL) 325 (65 FE) MG tablet Take 1 tablet (325 mg total) by mouth 2 (two) times daily. 05/11/11 05/10/12 Yes Alison Murray, MD  folic acid (FOLVITE) 1 MG tablet Take 1 tablet (1 mg total) by mouth daily. 05/11/11 05/10/12 Yes Alma Concepcion Elk, MD  haloperidol (HALDOL) 5 MG tablet Take 1 tablet (5 mg total) by mouth 2 (two) times daily. 05/11/11 06/10/11 Yes Alma Concepcion Elk, MD  metoprolol succinate (TOPROL-XL) 25 MG 24 hr tablet Take 1 tablet (25 mg total) by mouth daily. 02/18/11  Yes Corwin Levins, MD  Multiple Vitamin (MULITIVITAMIN WITH MINERALS) TABS Take 1 tablet by mouth daily.   Yes Historical Provider, MD  omeprazole (PRILOSEC) 40 MG capsule Take 40 mg by mouth daily.     Yes Historical Provider, MD  potassium chloride SA (K-DUR,KLOR-CON) 20 MEQ tablet Take 20 mEq by mouth daily. 05/11/11 05/10/12 Yes Alma Concepcion Elk, MD  thiamine 100 MG tablet Take 1 tablet (100 mg total) by mouth daily. 05/11/11 05/10/12 Yes Alison Murray, MD  valsartan (DIOVAN) 80 MG tablet Take 1 tablet (80 mg total) by mouth daily. 02/18/11  Yes Corwin Levins, MD  Vitamins A & D (VITAMIN A & D) cream Apply 1 application topically daily.   Yes Historical Provider, MD    Social History:  reports that he quit smoking about 53 years ago. He has never used smokeless tobacco. He reports that he does not drink alcohol or use illicit drugs.  Family History  Problem Relation Age of Onset  . Colon cancer Mother 24  . Diabetes Sister   . Hypertension Sister   . Coronary artery disease Sister   . Multiple sclerosis Sister   . Stroke Brother     Review of Systems:  Constitutional: Denies fever, chills, diaphoresis, appetite change and fatigue.  HEENT: Denies photophobia, eye pain, redness, hearing loss, ear pain, congestion, sore throat, rhinorrhea, sneezing, mouth  sores, trouble swallowing, neck pain, neck stiffness and tinnitus.   Respiratory: Denies SOB, DOE, cough, chest tightness,  and wheezing.   Cardiovascular: Denies chest pain, palpitations and leg swelling.  Gastrointestinal: Denies nausea, vomiting, abdominal pain, diarrhea, constipation, blood in stool and abdominal distention.  Genitourinary: Denies dysuria, urgency, frequency, hematuria, flank pain and difficulty urinating.  Musculoskeletal: Denies myalgias, back pain, joint swelling, arthralgias and gait problem.  Skin: Denies pallor, rash and wound.  Neurological: Denies dizziness, seizures, syncope, weakness, light-headedness, numbness and headaches.  Hematological: Denies adenopathy. Easy bruising, personal or family bleeding history  Psychiatric/Behavioral: Denies suicidal ideation, mood changes, confusion, nervousness, sleep disturbance and agitation   Physical Exam: Blood pressure 134/66, pulse 68,  temperature 98.1 F (36.7 C), temperature source Oral, resp. rate 18, SpO2 100.00%. General: Alert, awake, oriented x3, in no acute distress. HEENT: No bruits, no goiter. Heart: Regular rate and rhythm, without murmurs, rubs, gallops. Lungs: Clear to auscultation bilaterally. Abdomen: Soft, tenderness to palpation at LLQ, no rebound guarding,  nondistended, positive bowel sounds. GU:  Patient has foley in place.  There is frank hematuria around his urethral meatus  Extremities: No clubbing cyanosis or edema with positive pedal pulses. Neuro: Grossly intact, nonfocal.    Labs on Admission:  Results for orders placed during the hospital encounter of 06/02/11 (from the past 48 hour(s))  CBC     Status: Abnormal   Collection Time   06/02/11  3:00 PM      Component Value Range Comment   WBC 12.3 (*) 4.0 - 10.5 (K/uL)    RBC 3.17 (*) 4.22 - 5.81 (MIL/uL)    Hemoglobin 9.1 (*) 13.0 - 17.0 (g/dL)    HCT 16.1 (*) 09.6 - 52.0 (%)    MCV 89.3  78.0 - 100.0 (fL)    MCH 28.7  26.0 - 34.0  (pg)    MCHC 32.2  30.0 - 36.0 (g/dL)    RDW 04.5 (*) 40.9 - 15.5 (%)    Platelets 352  150 - 400 (K/uL)   DIFFERENTIAL     Status: Abnormal   Collection Time   06/02/11  3:00 PM      Component Value Range Comment   Neutrophils Relative 80 (*) 43 - 77 (%)    Neutro Abs 9.8 (*) 1.7 - 7.7 (K/uL)    Lymphocytes Relative 9 (*) 12 - 46 (%)    Lymphs Abs 1.1  0.7 - 4.0 (K/uL)    Monocytes Relative 11  3 - 12 (%)    Monocytes Absolute 1.3 (*) 0.1 - 1.0 (K/uL)    Eosinophils Relative 0  0 - 5 (%)    Eosinophils Absolute 0.1  0.0 - 0.7 (K/uL)    Basophils Relative 0  0 - 1 (%)    Basophils Absolute 0.0  0.0 - 0.1 (K/uL)   BASIC METABOLIC PANEL     Status: Abnormal   Collection Time   06/02/11  3:00 PM      Component Value Range Comment   Sodium 128 (*) 135 - 145 (mEq/L)    Potassium 6.1 (*) 3.5 - 5.1 (mEq/L) MODERATE HEMOLYSIS   Chloride 96  96 - 112 (mEq/L)    CO2 24  19 - 32 (mEq/L)    Glucose, Bld 87  70 - 99 (mg/dL)    BUN 15  6 - 23 (mg/dL)    Creatinine, Ser 8.11  0.50 - 1.35 (mg/dL)    Calcium 8.3 (*) 8.4 - 10.5 (mg/dL)    GFR calc non Af Amer 78 (*) >90 (mL/min)    GFR calc Af Amer >90  >90 (mL/min)   PROTIME-INR     Status: Normal   Collection Time   06/02/11  3:00 PM      Component Value Range Comment   Prothrombin Time 14.0  11.6 - 15.2 (seconds)    INR 1.06  0.00 - 1.49    APTT     Status: Normal   Collection Time   06/02/11  3:00 PM      Component Value Range Comment   aPTT 31  24 - 37 (seconds)     Radiological Exams on Admission: No results found.  Assessment/Plan 1) Acute blood loss  anemia:  -related to his history of hemorrhagic cystitis.  Urology has been consulted and we will follow up with their recommendations.  At this point will plan on holding patient's aspirin.  - Follow cbc's and place patient in step down unit.  Will type and cross and should patient require transfusion will do so as per hem/onc's prior recommendations made on 04/27/11.  2) Intra  abdominal abscess:   -Results of cd containing CT results of abdomen and pelvis reviewed.  Agree with consult to IR and General surgery.  Discussed with Cardiology and they will see patient should patient require surgery.  For now will plan on continuing IV cipro and flagyl.  3) Hyperkalemia:  At this point patient has been given lasix.  I will have to monitor patient as he has become fluid overloaded in the near past and has developed respiratory failure.  Thus I will recheck a level this evening and tomorrow in the am.  Will also plan on holding his potassium replacement medication.  4) Hemorrhagic cystitis/h/o adenocarcinoma prostate:  -Per Urology  5) Atrial fibrillation: Currently well controlled on current regimen.  Will continue and monitor patient closely.  6) HTN: Stable and well controlled currently.  Continue current regimen.  7) CAD:  Will hold aspirin as per discussion with Cardiology  8) Alcohol dependence:  Given history will plan on placing on CIWA protocol  Time Spent on Admission: >70 minutes reviewing history on chart, discussing with other physicians involved, physical exam, history, updating information systems, billing.  Penny Pia [facility] Pager: [phone] 06/02/2011, 5:14 PM

## 2011-06-02 NOTE — Consult Note (Signed)
Urology Consult  Referring physician: [facility] Reason for referral: gross hematuria  Chief Complaint: gross hematuria  History of Present Illness: 78 yo male post RRP 2001 with psa recurrence treated with EBRT in 2007. He has done well until 2013, when he began to have gross hematuria, post cysto,  Negative biopsies, with diagnosis of radiation cystitis. He has had a stormy course requiring multiple hospitalizations with carbopost irrigation, and transfusions, and now is for hyperbaric chamber  Treatment on Monday. However, CT scan this am reveals a 6.5cm peri-colonic fluid filled cystic mass c/w abscess formation, and pt is admitted for Ao valve antibioticcoverage, and interventional radiologic drainage in AM.  Past Medical History  Diagnosis Date  . ADENOCARCINOMA, PROSTATE   . ALLERGIC RHINITIS   . B12 DEFICIENCY   . BENIGN PROSTATIC HYPERTROPHY   . CAROTID ARTERY DISEASE   . CEREBROVASCULAR ACCIDENT, HX OF   . CORONARY ARTERY DISEASE   . DIVERTICULOSIS, COLON   . GERD   . HEMORRHOIDS   . HIATAL HERNIA   . HYPERLIPIDEMIA   . HYPERTENSION   . OSTEOPENIA   . PEPTIC ULCER DISEASE   . PERIPHERAL VASCULAR DISEASE   . Personal history of alcoholism   . PROSTATE CANCER, HX OF   . PUD, HX OF   . TRANSIENT ISCHEMIC ATTACK   . Shingles   . Anemia   . AS (aortic stenosis)   . S/P AVR (aortic valve replacement) 09/23/2010  . Complication of anesthesia 09/23/2010    very confused after waking up. Stopped drinking 40 days before this surgery.  . Transfusion reaction, chill fever type 04/28/2011    Fever, rigors, tachycardia,tachypnea but no evidence of hemolysis  . Hemorrhagic cystitis 04/28/2011    Due to previous radiation for PSA recurrence of prostate cancer   Past Surgical History  Procedure Date  . Popliteal synovial cyst excision   . Right carotid endarterectomy 1999    Left carotid total chronic occlusion  . Prostate surgery   . Cardic stent 2004  . Aortic valve replacement  09/23/2010    #21mm [facility] Ease pericardial tissue valve  . Coronary artery bypass graft 09/23/2010    CABG x1 using LIMA to LAD  . Cystoscopy with biopsy 02/15/2011    Procedure: CYSTOSCOPY WITH BIOPSY;  Surgeon: Anner Crete, MD;  Location: [facility];  Service: Urology;  Laterality: N/A;  Cystoscopy, biopsy and fulguration of the bladder  Delirum Tremens    Medications: I have reviewed the patient's current medications. Allergies:  Allergies  Allergen Reactions  . Tolterodine Tartrate     Family History  Problem Relation Age of Onset  . Colon cancer Mother 53  . Diabetes Sister   . Hypertension Sister   . Coronary artery disease Sister   . Multiple sclerosis Sister   . Stroke Brother    Social History:  reports that he quit smoking about 53 years ago. He has never used smokeless tobacco. He reports that he does not drink alcohol or use illicit drugs.  ROS: All systems are reviewed and negative except as noted.   Physical Exam:  Vital signs in last 24 hours: Temp:  [98.1 F (36.7 C)] 98.1 F (36.7 C) (05/15 1326) Pulse Rate:  [55-68] 68  (05/15 1548) Resp:  [16-18] 18  (05/15 1548) BP: (131-134)/(59-66) 134/66 mmHg (05/15 1548) SpO2:  [97 %-100 %] 100 % (05/15 1548)  Cardiovascular: Skin warm; not flushed Respiratory: Breaths quiet; no shortness of breath Abdomen: No masses Neurological: Normal  sensation to touch Musculoskeletal: Normal motor function arms and legs Lymphatics: No inguinal adenopathy Skin: No rashes Genitourinary: penis pink, viable. Gross hematuria. Cath removed, and exchanged for 83F 3-way cath for irrigation.   Laboratory Data:  Results for orders placed during the hospital encounter of 06/02/11 (from the past 72 hour(s))  CBC     Status: Abnormal   Collection Time   06/02/11  3:00 PM      Component Value Range Comment   WBC 12.3 (*) 4.0 - 10.5 (K/uL)    RBC 3.17 (*) 4.22 - 5.81 (MIL/uL)    Hemoglobin 9.1 (*) 13.0 - 17.0 (g/dL)    HCT  16.1 (*) 09.6 - 52.0 (%)    MCV 89.3  78.0 - 100.0 (fL)    MCH 28.7  26.0 - 34.0 (pg)    MCHC 32.2  30.0 - 36.0 (g/dL)    RDW 04.5 (*) 40.9 - 15.5 (%)    Platelets 352  150 - 400 (K/uL)   DIFFERENTIAL     Status: Abnormal   Collection Time   06/02/11  3:00 PM      Component Value Range Comment   Neutrophils Relative 80 (*) 43 - 77 (%)    Neutro Abs 9.8 (*) 1.7 - 7.7 (K/uL)    Lymphocytes Relative 9 (*) 12 - 46 (%)    Lymphs Abs 1.1  0.7 - 4.0 (K/uL)    Monocytes Relative 11  3 - 12 (%)    Monocytes Absolute 1.3 (*) 0.1 - 1.0 (K/uL)    Eosinophils Relative 0  0 - 5 (%)    Eosinophils Absolute 0.1  0.0 - 0.7 (K/uL)    Basophils Relative 0  0 - 1 (%)    Basophils Absolute 0.0  0.0 - 0.1 (K/uL)   BASIC METABOLIC PANEL     Status: Abnormal   Collection Time   06/02/11  3:00 PM      Component Value Range Comment   Sodium 128 (*) 135 - 145 (mEq/L)    Potassium 6.1 (*) 3.5 - 5.1 (mEq/L) MODERATE HEMOLYSIS   Chloride 96  96 - 112 (mEq/L)    CO2 24  19 - 32 (mEq/L)    Glucose, Bld 87  70 - 99 (mg/dL)    BUN 15  6 - 23 (mg/dL)    Creatinine, Ser 8.11  0.50 - 1.35 (mg/dL)    Calcium 8.3 (*) 8.4 - 10.5 (mg/dL)    GFR calc non Af Amer 78 (*) >90 (mL/min)    GFR calc Af Amer >90  >90 (mL/min)   PROTIME-INR     Status: Normal   Collection Time   06/02/11  3:00 PM      Component Value Range Comment   Prothrombin Time 14.0  11.6 - 15.2 (seconds)    INR 1.06  0.00 - 1.49    APTT     Status: Normal   Collection Time   06/02/11  3:00 PM      Component Value Range Comment   aPTT 31  24 - 37 (seconds)    No results found for this or any previous visit (from the past 240 hour(s)). Creatinine:  Basename 06/02/11 1500  CREATININE 0.85    Xrays: See report/chart See CT report  Impression/Assessment:  78 yo male with radiation cystitis and gross hematuria with pelvic fluid collection.   Plan:  Admit. Bladder drainage. Perc transgluteal in AM. Carboprost protocol.  Jowell Bossi  I 06/02/2011, 6:05 PM

## 2011-06-03 ENCOUNTER — Inpatient Hospital Stay (HOSPITAL_COMMUNITY): Payer: Medicare Other

## 2011-06-03 DIAGNOSIS — E871 Hypo-osmolality and hyponatremia: Secondary | ICD-10-CM

## 2011-06-03 DIAGNOSIS — N3080 Other cystitis without hematuria: Secondary | ICD-10-CM

## 2011-06-03 DIAGNOSIS — I359 Nonrheumatic aortic valve disorder, unspecified: Secondary | ICD-10-CM

## 2011-06-03 DIAGNOSIS — I10 Essential (primary) hypertension: Secondary | ICD-10-CM

## 2011-06-03 LAB — BASIC METABOLIC PANEL
GFR calc non Af Amer: 77 mL/min — ABNORMAL LOW (ref >90)
Glucose, Bld: 95 mg/dL (ref 70–99)
Potassium: 3.9 mEq/L (ref 3.5–5.1)
Sodium: 129 mEq/L — ABNORMAL LOW (ref 135–145)

## 2011-06-03 LAB — CBC
Hemoglobin: 7.9 g/dL — ABNORMAL LOW (ref 13.0–17.0)
Hemoglobin: 7.9 g/dL — ABNORMAL LOW (ref 13.0–17.0)
MCHC: 33.1 g/dL (ref 30.0–36.0)
Platelets: 297 10*3/uL (ref 150–400)
RBC: 2.75 MIL/uL — ABNORMAL LOW (ref 4.22–5.81)
RBC: 2.71 MIL/uL — ABNORMAL LOW (ref 4.22–5.81)

## 2011-06-03 MED ORDER — SODIUM CHLORIDE 0.9 % IV SOLN
1.0000 g | INTRAVENOUS | Status: DC
  Administered 2011-06-03 – 2011-06-06 (×4): 1 g via INTRAVENOUS
  Filled 2011-06-03 (×5): qty 1

## 2011-06-03 MED ORDER — FENTANYL CITRATE 0.05 MG/ML IJ SOLN
INTRAMUSCULAR | Status: AC | PRN
  Administered 2011-06-03 (×2): 100 ug via INTRAVENOUS

## 2011-06-03 NOTE — Progress Notes (Signed)
Patient seen and examined.  Purulent output from perc drain.  I am not sure whether the source of the abscess is a microperforation of the bladder or sigmoid diverticulum.  Will start IV InVanz for broad coverage until culture and sensitivities are known then narrow the coverage.  He should have an injection through the drain prior to it's removal to see if it communicates with the GI or GU tract.

## 2011-06-03 NOTE — Progress Notes (Signed)
Subjective: Can't believe it's already done.  No other complaints  Objective: Vital signs in last 24 hours: Temp:  [98 F (36.7 C)-99.8 F (37.7 C)] 98 F (36.7 C) (05/16 1301) Pulse Rate:  [68-85] 74  (05/16 1301) Resp:  [8-18] 18  (05/16 1301) BP: (108-163)/(55-78) 125/72 mmHg (05/16 1301) SpO2:  [96 %-100 %] 99 % (05/16 1301) Weight:  [73.846 kg (162 lb 12.8 oz)] 73.846 kg (162 lb 12.8 oz) (05/15 2223) Last BM Date: 06/03/11  [facility] PURULENT drainage from CT guided drain.Tm99.8  VSS, Na and K+ better, WBC is up. H/H down  Intake/Output from previous day: 05/15 0701 - 05/16 0700 In: 100 [IV Piggyback:100] Out: 2751 [Urine:2750; Stool:1] Intake/Output this shift: Total I/O In: 0  Out: 1 [Stool:1]  General appearance: alert, cooperative and no distress GI: soft, non-tender; bowel sounds normal; no masses,  no organomegaly  Lab Results:   [facility] 06/03/11 0520 06/02/11 2000  WBC 14.8* 12.1*  HGB 7.9* 8.8*  HCT 23.9* 27.4*  PLT 300 300    BMET  Basename 06/03/11 0520 06/02/11 1810 06/02/11 1500  NA 129* -- 128*  K 3.9 4.1 --  CL 97 -- 96  CO2 23 -- 24  GLUCOSE 95 -- 87  BUN 15 -- 15  CREATININE 0.90 -- 0.85  CALCIUM 8.0* -- 8.3*   PT/INR  Basename 06/02/11 1500  LABPROT 14.0  INR 1.06    No results found for this basename: AST:5,ALT:5,ALKPHOS:5,BILITOT:5,PROT:5,ALBUMIN:5 in the last 168 hours   Lipase  No results found for this basename: lipase     Studies/Results: Ct Guided Abscess Drain  06/03/2011  *RADIOLOGY REPORT*  Clinical Data: Pelvic abscess, elevated white count, pain  CT GUIDED TRANSGLUTEAL PELVIC ABSCESS DRAINAGE  Date:  06/03/2011 10:15:00  Radiologist:  [name]. Ruel Favors, M.D.  Medications:  200 mcg Fentanyl  Guidance:  CT  Fluoroscopy time:  None.  Sedation time:  10 minutes  Contrast volume:  None.  Complications:  No immediate  PROCEDURE/FINDINGS:  Informed consent was obtained from the patient following explanation of the procedure,  risks, benefits and alternatives. The patient understands, agrees and consents for the procedure. All questions were addressed.  A time out was performed.  Maximal barrier sterile technique utilized including caps, mask, sterile gowns, sterile gloves, large sterile drape, hand hygiene, and betadine  Previous imaging reviewed.  The patient was positioned prone. Noncontrast localization CT performed.  The pelvic fluid collection was localized.  Under sterile conditions and local anesthesia, an 18 gauge 15 cm access needle was advanced from a right transgluteal approach into the collection.  Syringe aspiration yielded blood tinged purulent fluid.  Sample sent for Gram stain and culture. Guide wire advanced followed by dilatation to insert a 10-French drain.  Drain catheter position confirmed with CT.  Catheter connected to an external suction bulb.  Catheter was secured at the skin with a Prolene suture.  Sterile dressing applied.  No immediate complication.  The patient tolerated the procedure well. 20 ml pleural fluid removed initially.  IMPRESSION: Successful CT guided right transgluteal pelvic abscess drainage  Original Report Authenticated By: [name]. Ruel Favors, M.D.    Medications:    . amiodarone  200 mg Oral BID  . ciprofloxacin  400 mg Intravenous Once  . ciprofloxacin  400 mg Intravenous Q12H  . ferrous sulfate  325 mg Oral TID WC  . folic acid  1 mg Oral Daily  . furosemide  40 mg Intravenous Once  . irbesartan  75 mg  Oral Daily  . metoprolol succinate  25 mg Oral QPM  . metronidazole  500 mg Intravenous Once  . metronidazole  500 mg Intravenous Q8H  . mulitivitamin with minerals  1 tablet Oral Daily  . pantoprazole  40 mg Oral Q1200  . simvastatin  40 mg Oral QHS  . sodium chloride  500 mL Intravenous Once  . sodium chloride  3 mL Intravenous Q12H  . thiamine  100 mg Oral Daily  . cyanocobalamin  100 mcg Oral Daily  . DISCONTD: sodium chloride   Intravenous Once  . DISCONTD: folic acid   1 mg Oral Daily  . DISCONTD: thiamine  100 mg Intravenous Daily  . DISCONTD: thiamine  100 mg Oral Daily    Assessment/Plan 1. Intra-abdominal abscess by CT. No prior history of diverticulitis or diverticulosis. Purulent drainage from site after CT guided drain placement 06/03/11. 2. History of prostate cancer, status post prostatectomy, radiation therapy, hemorrhagic cystitis, ongoing leakage around his foley.  3. Recent hospitalization with hypoxia and acute respiratory failure requiring ventilator support.  4. S/P aortic valve replacement, September 2012  5. Atrial fibrillation controlled with metoprolol and amiodarone.  6. Recent congestive heart failure  7. History of alcohol use and encephalopathy, recurrent DT'S  8. History of stroke with occluded left carotid, status post right carotid endarterectomy.  9.Hyperkalemia  10.Hyponatremia   Plan:  Keep him NPO for now and treat like diverticular abscess, with bowel rest.     LOS: 1 day    Harshita Bernales 06/03/2011

## 2011-06-03 NOTE — Progress Notes (Deleted)
D/C instructions reviewed with pt and spouse, pt has no further questions at this time. Prescription given.  

## 2011-06-03 NOTE — Progress Notes (Signed)
Subjective: Patient reports feeling better. Reports suprapubic pain this am. No spasm. Bleeding continues. On Carboprost.   Objective: Vital signs in last 24 hours: Temp:  [98.1 F (36.7 C)-99.8 F (37.7 C)] 99.8 F (37.7 C) (05/16 0653) Pulse Rate:  [55-85] 82  (05/16 0653) Resp:  [16-18] 17  (05/16 0653) BP: (108-163)/(55-66) 110/55 mmHg (05/16 0653) SpO2:  [96 %-100 %] 96 % (05/16 0653) Weight:  [73.846 kg (162 lb 12.8 oz)] 73.846 kg (162 lb 12.8 oz) (05/15 2223)A  Intake/Output from previous day: 05/15 0701 - 05/16 0700 In: 100 [IV Piggyback:100] Out: 2751 [Urine:2750; Stool:1] Intake/Output this shift:    Past Medical History  Diagnosis Date  . ADENOCARCINOMA, PROSTATE   . ALLERGIC RHINITIS   . B12 DEFICIENCY   . BENIGN PROSTATIC HYPERTROPHY   . CAROTID ARTERY DISEASE   . CEREBROVASCULAR ACCIDENT, HX OF   . CORONARY ARTERY DISEASE   . DIVERTICULOSIS, COLON   . GERD   . HEMORRHOIDS   . HIATAL HERNIA   . HYPERLIPIDEMIA   . HYPERTENSION   . OSTEOPENIA   . PEPTIC ULCER DISEASE   . PERIPHERAL VASCULAR DISEASE   . Personal history of alcoholism   . PROSTATE CANCER, HX OF   . PUD, HX OF   . TRANSIENT ISCHEMIC ATTACK   . Shingles   . Anemia   . AS (aortic stenosis)   . S/P AVR (aortic valve replacement) 09/23/2010  . Complication of anesthesia 09/23/2010    very confused after waking up. Stopped drinking 40 days before this surgery.  . Transfusion reaction, chill fever type 04/28/2011    Fever, rigors, tachycardia,tachypnea but no evidence of hemolysis  . Hemorrhagic cystitis 04/28/2011    Due to previous radiation for PSA recurrence of prostate cancer    Physical Exam:  General: thin, benign abdomen. Foley in place.Gross hematuria.  Lungs - Normal respiratory effort, chest expands symmetrically.  Abdomen - Soft, non-tender & non-distended.  Lab Results:  Basename 06/03/11 0520 06/02/11 2000 06/02/11 1500  WBC 14.8* 12.1* 12.3*  HGB 7.9* 8.8* 9.1*  HCT 23.9*  27.4* 28.3*   BMET  Basename 06/03/11 0520 06/02/11 1810 06/02/11 1500  NA 129* -- 128*  K 3.9 4.1 --  CL 97 -- 96  CO2 23 -- 24  GLUCOSE 95 -- 87  BUN 15 -- 15  CREATININE 0.90 -- 0.85  CALCIUM 8.0* -- 8.3*   No results found for this basename: LABURIN:1 in the last 72 hours Results for orders placed during the hospital encounter of 04/25/11  URINE CULTURE     Status: Normal   Collection Time   04/25/11  4:24 AM      Component Value Range Status Comment   Specimen Description URINE, CATHETERIZED   Final    Special Requests NONE   Final    Culture  Setup Time [id number]   Final    Colony Count >=100,000 COLONIES/ML   Final    Culture ESCHERICHIA COLI   Final    Report Status 04/27/2011 FINAL   Final    Organism ID, Bacteria ESCHERICHIA COLI   Final   MRSA PCR SCREENING     Status: Normal   Collection Time   04/25/11  6:54 PM      Component Value Range Status Comment   MRSA by PCR NEGATIVE  NEGATIVE  Final   CULTURE, BLOOD (ROUTINE X 2)     Status: Normal   Collection Time   04/29/11 12:45 PM  Component Value Range Status Comment   Specimen Description BLOOD CL   Final    Special Requests BOTTLES DRAWN AEROBIC AND ANAEROBIC 10CC   Final    Culture  Setup Time [id number]   Final    Culture NO GROWTH 5 DAYS   Final    Report Status 05/06/2011 FINAL   Final   CULTURE, BLOOD (ROUTINE X 2)     Status: Normal   Collection Time   04/29/11  4:15 PM      Component Value Range Status Comment   Specimen Description BLOOD RIGHT ARM   Final    Special Requests BOTTLES DRAWN AEROBIC AND ANAEROBIC 4 CC EACH   Final    Culture  Setup Time [id number]   Final    Culture NO GROWTH 5 DAYS   Final    Report Status 05/06/2011 FINAL   Final   CULTURE, RESPIRATORY     Status: Normal   Collection Time   04/29/11  4:29 PM      Component Value Range Status Comment   Specimen Description TRACHEAL ASPIRATE   Final    Special Requests Immunocompromised   Final    Gram Stain     Final     Value: RARE WBC PRESENT,BOTH PMN AND MONONUCLEAR     RARE SQUAMOUS EPITHELIAL CELLS PRESENT     NO ORGANISMS SEEN   Culture Non-Pathogenic Oropharyngeal-type Flora Isolated.   Final    Report Status 05/02/2011 FINAL   Final   CLOSTRIDIUM DIFFICILE BY PCR     Status: Normal   Collection Time   05/04/11  2:12 PM      Component Value Range Status Comment   C difficile by pcr NEGATIVE  NEGATIVE  Final     Studies/Results: @RISRSLT24 @  Assessment/Plan: Radiation cystitis with gross hematuria and clot formation on 3 way continuous irrigation with Carboprost protocol; and CT finding of 6.5cm mass arising from his rectum ( ?). He needs drainage this am, and clot irrigation, antibiotics, and possible transfusion.    Thanks to [facility],and [facility] for help with very difficult management.  Valerian Jewel I 06/03/2011, 8:33 AM

## 2011-06-03 NOTE — Progress Notes (Signed)
Subjective: Patient has no new complaints this morning.  Still is having some hematuria but reports less leakage from foley.  Patient is alert and responding to questions this am.  Denies any fever or chills.  Did report some abdominal discomfort that did not improve after a BM.    Objective: Filed Vitals:   06/02/11 1940 06/02/11 2209 06/02/11 2223 06/03/11 0653  BP: 138/65 108/64 163/65 110/55  Pulse: 76 83 85 82  Temp: 99.2 F (37.3 C)  98.9 F (37.2 C) 99.8 F (37.7 C)  TempSrc: Oral  Oral Oral  Resp: 18  16 17   Height:   5\' 11"  (1.803 m)   Weight:   73.846 kg (162 lb 12.8 oz)   SpO2: 100%  99% 96%   Weight change:   Intake/Output Summary (Last 24 hours) at 06/03/11 0947 Last data filed at 06/03/11 0357  Gross per 24 hour  Intake    100 ml  Output   2751 ml  Net  -2651 ml    General: Alert, awake, oriented x3, in no acute distress.  HEENT: No bruits, no goiter.  Heart: irregularly irregular rate controlled, without murmurs, rubs, gallops.  Lungs: Clear to auscultation, bilateral air movement.  Abdomen: Soft, tenderness with palpation over LLQ no guarding or rebound tenderness, nondistended, positive bowel sounds.  Neuro: Grossly intact, nonfocal.   Lab Results:  Basename 06/03/11 0520 06/02/11 1810 06/02/11 1500  NA 129* -- 128*  K 3.9 4.1 --  CL 97 -- 96  CO2 23 -- 24  GLUCOSE 95 -- 87  BUN 15 -- 15  CREATININE 0.90 -- 0.85  CALCIUM 8.0* -- 8.3*  MG -- -- --  PHOS -- -- --   No results found for this basename: AST:2,ALT:2,ALKPHOS:2,BILITOT:2,PROT:2,ALBUMIN:2 in the last 72 hours No results found for this basename: LIPASE:2,AMYLASE:2 in the last 72 hours  Basename 06/03/11 0520 06/02/11 2000 06/02/11 1500  WBC 14.8* 12.1* --  NEUTROABS -- -- 9.8*  HGB 7.9* 8.8* --  HCT 23.9* 27.4* --  MCV 86.9 88.4 --  PLT 300 300 --   No results found for this basename: CKTOTAL:3,CKMB:3,CKMBINDEX:3,TROPONINI:3 in the last 72 hours No components found with this  basename: POCBNP:3 No results found for this basename: DDIMER:2 in the last 72 hours No results found for this basename: HGBA1C:2 in the last 72 hours No results found for this basename: CHOL:2,HDL:2,LDLCALC:2,TRIG:2,CHOLHDL:2,LDLDIRECT:2 in the last 72 hours No results found for this basename: TSH,T4TOTAL,FREET3,T3FREE,THYROIDAB in the last 72 hours No results found for this basename: VITAMINB12:2,FOLATE:2,FERRITIN:2,TIBC:2,IRON:2,RETICCTPCT:2 in the last 72 hours  Micro Results: No results found for this or any previous visit (from the past 240 hour(s)).  Studies/Results: No results found.  Medications: I have reviewed the patient's current medications.   Patient Active Hospital Problem List: Acute blood loss anemia (04/26/2011) -Secondary to hematuria that has been an on going problem for the patient after localized radiation therapy for his prostate cancer.  Will monitor cbc this afternoon and in the AM.  Will consider transfusion should patient's hemoglobin get closer to 7.0.  Currently at 7.9 will continue to watch at this juncture.  Atrial fibrillation (04/28/2011) -Stable will plan on continuing current regimen.  Intra-abdominal abscess (06/02/2011) -May account for patient's leukocytosis.  Patient is currently on cipro and flagyl IV today is day # 2.  Will plan on continuing an antibiotic regimen for 7-12 days pending clinical improvement in condition.    Patient will be seen by interventional radiologist.  Will reassess after procedure.  ADENOCARCINOMA, PROSTATE (08/06/2008) Per Urology.  HYPERTENSION (11/09/2006) Has been stable.  Will plan on continuing current regimen.   CORONARY ARTERY DISEASE (11/09/2006) We will continue to hold patient's aspirin while he has active hematuria.  S/P AVR (aortic valve replacement) (09/23/2010) Should the patient require surgery will have cardiology clear him.  Otherwise will continue to observe as patient is stable  currently.  Hemorrhagic cystitis (04/28/2011) Urology is managing and has started patient on 3 way continuous irrigation with carboprost protocol.    Disposition:  Pending clinical improvement in condition.      LOS: 1 day   Penny Pia M.D.  [facility] 06/03/2011, 9:47 AM

## 2011-06-03 NOTE — H&P (Signed)
CC: possible abscess versus hematoma. See urology note below :   Kathi Ludwig, MD Physician Signed Urology Consult Note 06/02/2011 6:04 PM  Urology Consult   Referring physician: [facility] Reason for referral: gross hematuria  Chief Complaint: gross hematuria  History of Present Illness: 78 yo male post RRP 2001 with psa recurrence treated with EBRT in 2007. He has done well until 2013, when he began to have gross hematuria, post cysto,  Negative biopsies, with diagnosis of radiation cystitis. He has had a stormy course requiring multiple hospitalizations with carbopost irrigation, and transfusions, and now is for hyperbaric chamber  Treatment on Monday. However, CT scan this am reveals a 6.5cm peri-colonic fluid filled cystic mass c/w abscess formation, and pt is admitted for Ao valve antibioticcoverage, and interventional radiologic drainage in AM.    Past Medical History   Diagnosis  Date   .  ADENOCARCINOMA, PROSTATE     .  ALLERGIC RHINITIS     .  B12 DEFICIENCY     .  BENIGN PROSTATIC HYPERTROPHY     .  CAROTID ARTERY DISEASE     .  CEREBROVASCULAR ACCIDENT, HX OF     .  CORONARY ARTERY DISEASE     .  DIVERTICULOSIS, COLON     .  GERD     .  HEMORRHOIDS     .  HIATAL HERNIA     .  HYPERLIPIDEMIA     .  HYPERTENSION     .  OSTEOPENIA     .  PEPTIC ULCER DISEASE     .  PERIPHERAL VASCULAR DISEASE     .  Personal history of alcoholism     .  PROSTATE CANCER, HX OF     .  PUD, HX OF     .  TRANSIENT ISCHEMIC ATTACK     .  Shingles     .  Anemia     .  AS (aortic stenosis)     .  S/P AVR (aortic valve replacement)  09/23/2010   .  Complication of anesthesia  09/23/2010       very confused after waking up. Stopped drinking 40 days before this surgery.   .  Transfusion reaction, chill fever type  04/28/2011       Fever, rigors, tachycardia,tachypnea but no evidence of hemolysis   .  Hemorrhagic cystitis  04/28/2011       Due to previous radiation for PSA recurrence of  prostate cancer    Past Surgical History   Procedure  Date   .  Popliteal synovial cyst excision     .  Right carotid endarterectomy  1999       Left carotid total chronic occlusion   .  Prostate surgery     .  Cardic stent  2004   .  Aortic valve replacement  09/23/2010       #17mm [facility] Ease pericardial tissue valve   .  Coronary artery bypass graft  09/23/2010       CABG x1 using LIMA to LAD   .  Cystoscopy with biopsy  02/15/2011       Procedure: CYSTOSCOPY WITH BIOPSY;  Surgeon: Anner Crete, MD;  Location: [facility];  Service: Urology;  Laterality: N/A;  Cystoscopy, biopsy and fulguration of the bladder   Delirum Tremens     Medications: I have reviewed the patient's current medications. Allergies:  Allergies   Allergen  Reactions   .  Tolterodine Tartrate         Family History   Problem  Relation  Age of Onset   .  Colon cancer  Mother  41   .  Diabetes  Sister     .  Hypertension  Sister     .  Coronary artery disease  Sister     .  Multiple sclerosis  Sister     .  Stroke  Brother      Social History: reports that he quit smoking about 53 years ago. He has never used smokeless tobacco. He reports that he does not drink alcohol or use illicit drugs.  ROS: All systems are reviewed and negative except as noted.   Physical Exam:   Vital signs in last 24 hours: Temp:  [98.1 F (36.7 C)] 98.1 F (36.7 C) (05/15 1326) Pulse Rate:  [55-68] 68  (05/15 1548) Resp:  [16-18] 18  (05/15 1548) BP: (131-134)/(59-66) 134/66 mmHg (05/15 1548) SpO2:  [97 %-100 %] 100 % (05/15 1548)  Cardiovascular: Skin warm; not flushed Respiratory: Breaths quiet; no shortness of breath Abdomen: No masses Neurological: Normal sensation to touch Musculoskeletal: Normal motor function arms and legs Lymphatics: No inguinal adenopathy Skin: No rashes Genitourinary: penis pink, viable. Gross hematuria. Cath removed, and exchanged for 61F 3-way cath for irrigation.    Laboratory  Data:   Results for orders placed during the hospital encounter of 06/02/11 (from the past 72 hour(s))   CBC     Status: Abnormal     Collection Time     06/02/11  3:00 PM       Component  Value  Range  Comment     WBC  12.3 (*)  4.0 - 10.5 (K/uL)       RBC  3.17 (*)  4.22 - 5.81 (MIL/uL)       Hemoglobin  9.1 (*)  13.0 - 17.0 (g/dL)       HCT  16.1 (*)  09.6 - 52.0 (%)       MCV  89.3   78.0 - 100.0 (fL)       MCH  28.7   26.0 - 34.0 (pg)       MCHC  32.2   30.0 - 36.0 (g/dL)       RDW  04.5 (*)  40.9 - 15.5 (%)       Platelets  352   150 - 400 (K/uL)     DIFFERENTIAL     Status: Abnormal     Collection Time     06/02/11  3:00 PM       Component  Value  Range  Comment     Neutrophils Relative  80 (*)  43 - 77 (%)       Neutro Abs  9.8 (*)  1.7 - 7.7 (K/uL)       Lymphocytes Relative  9 (*)  12 - 46 (%)       Lymphs Abs  1.1   0.7 - 4.0 (K/uL)       Monocytes Relative  11   3 - 12 (%)       Monocytes Absolute  1.3 (*)  0.1 - 1.0 (K/uL)       Eosinophils Relative  0   0 - 5 (%)       Eosinophils Absolute  0.1   0.0 - 0.7 (K/uL)       Basophils Relative  0   0 - 1 (%)  Basophils Absolute  0.0   0.0 - 0.1 (K/uL)     BASIC METABOLIC PANEL     Status: Abnormal     Collection Time     06/02/11  3:00 PM       Component  Value  Range  Comment     Sodium  128 (*)  135 - 145 (mEq/L)       Potassium  6.1 (*)  3.5 - 5.1 (mEq/L)  MODERATE HEMOLYSIS     Chloride  96   96 - 112 (mEq/L)       CO2  24   19 - 32 (mEq/L)       Glucose, Bld  87   70 - 99 (mg/dL)       BUN  15   6 - 23 (mg/dL)       Creatinine, Ser  0.85   0.50 - 1.35 (mg/dL)       Calcium  8.3 (*)  8.4 - 10.5 (mg/dL)       GFR calc non Af Amer  78 (*)  >90 (mL/min)       GFR calc Af Amer  >90   >90 (mL/min)     PROTIME-INR     Status: Normal     Collection Time     06/02/11  3:00 PM       Component  Value  Range  Comment     Prothrombin Time  14.0   11.6 - 15.2 (seconds)       INR  1.06   0.00 - 1.49      APTT      Status: Normal     Collection Time     06/02/11  3:00 PM       Component  Value  Range  Comment     aPTT  31   24 - 37 (seconds)      No results found for this or any previous visit (from the past 240 hour(s)). Creatinine:  Basename  06/02/11 1500   CREATININE  0.85     Xrays: See report/chart See CT report  Impression/Assessment:  78 yo male with radiation cystitis and gross hematuria with pelvic fluid collection.   Plan:  Admit. Bladder drainage. Perc transgluteal in AM. Carboprost protocol.  TANNENBAUM, SIGMUND I 06/02/2011, 6:05 PM       Patient seen and am in agreement with ROS and PE for moderate sedation usage for possible aspiration/drain placement under CT guidance.  Procedure details, risks and benefits reviewed with patient with his apparent understanding. Labs are WNL to proceed.   Plan for procedure later this am.

## 2011-06-03 NOTE — Procedures (Signed)
Successful transgluteal 75fr pelvic abscess drain 20cc purulent fld aspirated No comp Stable GS CX sent Full report in PACS

## 2011-06-04 DIAGNOSIS — N3080 Other cystitis without hematuria: Secondary | ICD-10-CM

## 2011-06-04 DIAGNOSIS — I10 Essential (primary) hypertension: Secondary | ICD-10-CM

## 2011-06-04 DIAGNOSIS — E871 Hypo-osmolality and hyponatremia: Secondary | ICD-10-CM

## 2011-06-04 DIAGNOSIS — I359 Nonrheumatic aortic valve disorder, unspecified: Secondary | ICD-10-CM

## 2011-06-04 LAB — CBC
HCT: 20.3 % — ABNORMAL LOW (ref 39.0–52.0)
Hemoglobin: 6.6 g/dL — CL (ref 13.0–17.0)
Hemoglobin: 6.7 g/dL — CL (ref 13.0–17.0)
MCH: 28.9 pg (ref 26.0–34.0)
MCH: 28.8 pg (ref 26.0–34.0)
MCH: 28.2 pg (ref 26.0–34.0)
MCHC: 33 g/dL (ref 30.0–36.0)
MCHC: 33 g/dL (ref 30.0–36.0)
MCV: 87.5 fL (ref 78.0–100.0)
MCV: 84.4 fL (ref 78.0–100.0)
Platelets: 262 10*3/uL (ref 150–400)
Platelets: 274 10*3/uL (ref 150–400)
RBC: 2.32 MIL/uL — ABNORMAL LOW (ref 4.22–5.81)
RBC: 3.01 MIL/uL — ABNORMAL LOW (ref 4.22–5.81)
RDW: 17 % — ABNORMAL HIGH (ref 11.5–15.5)
RDW: 17.3 % — ABNORMAL HIGH (ref 11.5–15.5)

## 2011-06-04 LAB — PREPARE RBC (CROSSMATCH)

## 2011-06-04 MED ORDER — ATORVASTATIN CALCIUM 20 MG PO TABS
20.0000 mg | ORAL_TABLET | Freq: Every day | ORAL | Status: DC
  Administered 2011-06-04 – 2011-06-10 (×7): 20 mg via ORAL
  Filled 2011-06-04 (×10): qty 1

## 2011-06-04 MED ORDER — ACETAMINOPHEN 325 MG PO TABS
650.0000 mg | ORAL_TABLET | Freq: Once | ORAL | Status: AC
  Administered 2011-06-04: 650 mg via ORAL
  Filled 2011-06-04: qty 2

## 2011-06-04 MED ORDER — SODIUM CHLORIDE 0.9 % IR SOLN
Status: AC
  Administered 2011-06-05 – 2011-06-06 (×2): via INTRAVESICAL
  Filled 2011-06-04 (×2): qty 40

## 2011-06-04 MED ORDER — SODIUM CHLORIDE 0.9 % IV SOLN: 500.0000 mL | Freq: Once | INTRAVENOUS | Status: DC

## 2011-06-04 MED ORDER — SODIUM CHLORIDE 0.9 % IR SOLN
1400.0000 mL | Status: DC
  Administered 2011-06-04: 1400 mL

## 2011-06-04 MED ORDER — DIPHENHYDRAMINE HCL 25 MG PO CAPS
25.0000 mg | ORAL_CAPSULE | Freq: Once | ORAL | Status: AC
  Administered 2011-06-04: 25 mg via ORAL
  Filled 2011-06-04: qty 1

## 2011-06-04 NOTE — Progress Notes (Signed)
CRITICAL VALUE ALERT  Critical value received:  Hgn 6.6  Date of notification:  06/04/2011  Time of notification:  0515  Critical value read back:yes  Nurse who received alert:  Verdene Lennert, RN  MD notified (1st page):  Dr Adela Glimpse  Time of first page:  0515  MD notified (2nd page):  Time of second page:  Responding MD:  Dr. Adela Glimpse Time MD responded:  0520  Hgb =6.6 Urine cleareer

## 2011-06-04 NOTE — Progress Notes (Signed)
Blood available for transfusion, however pt refusing to sign consent before his labs are recollected. Patient states that he wants to make sure that the labs are accurate before agreeing to receive blood. Pt A/O x 4. Explained indication for blood transfusion, pt still refusing. Will notify Dr. Adela Glimpse. Pts foley also now light pink, blood clearing, however pt soiled bed x 2 this shift with due to F/C leaking. Pt states this has been an ongoing issue, MD aware per previous progress notes.

## 2011-06-04 NOTE — Progress Notes (Signed)
Subjective: Pt doing a little better since pelvic drain placed  Objective: Vital signs in last 24 hours: Temp:  [97.2 F (36.2 C)-98.6 F (37 C)] 97.5 F (36.4 C) (05/17 1605) Pulse Rate:  [67-84] 67  (05/17 1605) Resp:  [18-20] 20  (05/17 1605) BP: (97-133)/(49-73) 100/61 mmHg (05/17 1605) SpO2:  [97 %-99 %] 99 % (05/17 1420) Weight:  [162 lb 11.2 oz (73.8 kg)] 162 lb 11.2 oz (73.8 kg) (05/17 0537) Last BM Date: 06/03/11  Intake/Output from previous day: 05/16 0701 - 05/17 0700 In: 120 [I.V.:120] Out: 2016 [Urine:2000; Drains:15; Stool:1] Intake/Output this shift: Total I/O In: 75 [P.O.:50; Blood:25] Out: 950 [Urine:950]  TG/pelvic drain intact, output about 15 cc's light brown to yellow fluid (? Feculent), cx's pend  Lab Results:   Basename 06/04/11 0645 06/04/11 0425  WBC 8.4 10.0  HGB 6.7* 6.6*  HCT 20.3* 20.3*  PLT 251 262   BMET  Basename 06/03/11 0520 06/02/11 1810 06/02/11 1500  NA 129* -- 128*  K 3.9 4.1 --  CL 97 -- 96  CO2 23 -- 24  GLUCOSE 95 -- 87  BUN 15 -- 15  CREATININE 0.90 -- 0.85  CALCIUM 8.0* -- 8.3*   PT/INR  Basename 06/02/11 1500  LABPROT 14.0  INR 1.06   ABG No results found for this basename: PHART:2,PCO2:2,PO2:2,HCO3:2 in the last 72 hours Results for orders placed during the hospital encounter of 06/02/11  CULTURE, ROUTINE-ABSCESS     Status: Normal (Preliminary result)   Collection Time   06/03/11 12:53 PM      Component Value Range Status Comment   Specimen Description ABSCESS PELVIC   Final    Special Requests Normal   Final    Gram Stain     Final    Value: FEW WBC PRESENT,BOTH PMN AND MONONUCLEAR     FEW SQUAMOUS EPITHELIAL CELLS PRESENT     NO ORGANISMS SEEN   Culture NO GROWTH 1 DAY   Final    Report Status PENDING   Incomplete     Studies/Results: Ct Guided Abscess Drain  06/03/2011  *RADIOLOGY REPORT*  Clinical Data: Pelvic abscess, elevated white count, pain  CT GUIDED TRANSGLUTEAL PELVIC ABSCESS DRAINAGE   Date:  06/03/2011 10:15:00  Radiologist:  [name]. Ruel Favors, M.D.  Medications:  200 mcg Fentanyl  Guidance:  CT  Fluoroscopy time:  None.  Sedation time:  10 minutes  Contrast volume:  None.  Complications:  No immediate  PROCEDURE/FINDINGS:  Informed consent was obtained from the patient following explanation of the procedure, risks, benefits and alternatives. The patient understands, agrees and consents for the procedure. All questions were addressed.  A time out was performed.  Maximal barrier sterile technique utilized including caps, mask, sterile gowns, sterile gloves, large sterile drape, hand hygiene, and betadine  Previous imaging reviewed.  The patient was positioned prone. Noncontrast localization CT performed.  The pelvic fluid collection was localized.  Under sterile conditions and local anesthesia, an 18 gauge 15 cm access needle was advanced from a right transgluteal approach into the collection.  Syringe aspiration yielded blood tinged purulent fluid.  Sample sent for Gram stain and culture. Guide wire advanced followed by dilatation to insert a 10-French drain.  Drain catheter position confirmed with CT.  Catheter connected to an external suction bulb.  Catheter was secured at the skin with a Prolene suture.  Sterile dressing applied.  No immediate complication.  The patient tolerated the procedure well. 20 ml pleural fluid removed initially.  IMPRESSION: Successful CT guided right transgluteal pelvic abscess drainage  Original Report Authenticated By: [name]. Ruel Favors, M.D.    Anti-infectives: Anti-infectives     Start     Dose/Rate Route Frequency Ordered Stop   06/03/11 1800   ertapenem (INVANZ) 1 g in sodium chloride 0.9 % 50 mL IVPB        1 g 100 mL/hr over 30 Minutes Intravenous Every 24 hours 06/03/11 1737     06/02/11 1945   ciprofloxacin (CIPRO) IVPB 400 mg  Status:  Discontinued        400 mg 200 mL/hr over 60 Minutes Intravenous Every 12 hours 06/02/11 1944 06/03/11 1737    06/02/11 1945   metroNIDAZOLE (FLAGYL) IVPB 500 mg  Status:  Discontinued        500 mg 100 mL/hr over 60 Minutes Intravenous Every 8 hours 06/02/11 1944 06/03/11 1737   06/02/11 1515   ciprofloxacin (CIPRO) IVPB 400 mg        400 mg 200 mL/hr over 60 Minutes Intravenous  Once 06/02/11 1514 06/02/11 1639   06/02/11 1515   metroNIDAZOLE (FLAGYL) IVPB 500 mg        500 mg 100 mL/hr over 60 Minutes Intravenous  Once 06/02/11 1514 06/02/11 1639          Assessment/Plan: s/p transgluteal pelvic abscess drainage 5/16; monitor labs/check final cx's; check f/u CT with drain injection next week   LOS: 2 days    Ezri Fanguy,D [facility] 06/04/2011

## 2011-06-04 NOTE — Progress Notes (Signed)
  Subjective: Patient reports feeling better.  Has abscess drainage yesterday via trans gluteal route. Hematuria irrigated this AM. Transfusion now.   Objective: Vital signs in last 24 hours: Temp:  [97.3 F (36.3 C)-98.5 F (36.9 C)] 97.6 F (36.4 C) (05/17 1055) Pulse Rate:  [67-84] 67  (05/17 1055) Resp:  [8-20] 20  (05/17 1055) BP: (89-138)/(49-72) 99/55 mmHg (05/17 1055) SpO2:  [96 %-100 %] 97 % (05/17 1055) Weight:  [73.8 kg (162 lb 11.2 oz)] 73.8 kg (162 lb 11.2 oz) (05/17 0537)A  Intake/Output from previous day: 05/16 0701 - 05/17 0700 In: 120 [I.V.:120] Out: 2016 [Urine:2000; Drains:15; Stool:1] Intake/Output this shift: Total I/O In: 250 [Blood:250] Out: 600 [Urine:600]  Past Medical History  Diagnosis Date  . ADENOCARCINOMA, PROSTATE   . ALLERGIC RHINITIS   . B12 DEFICIENCY   . BENIGN PROSTATIC HYPERTROPHY   . CAROTID ARTERY DISEASE   . CEREBROVASCULAR ACCIDENT, HX OF   . CORONARY ARTERY DISEASE   . DIVERTICULOSIS, COLON   . GERD   . HEMORRHOIDS   . HIATAL HERNIA   . HYPERLIPIDEMIA   . HYPERTENSION   . OSTEOPENIA   . PEPTIC ULCER DISEASE   . PERIPHERAL VASCULAR DISEASE   . Personal history of alcoholism   . PROSTATE CANCER, HX OF   . PUD, HX OF   . TRANSIENT ISCHEMIC ATTACK   . Shingles   . Anemia   . AS (aortic stenosis)   . S/P AVR (aortic valve replacement) 09/23/2010  . Complication of anesthesia 09/23/2010    very confused after waking up. Stopped drinking 40 days before this surgery.  . Transfusion reaction, chill fever type 04/28/2011    Fever, rigors, tachycardia,tachypnea but no evidence of hemolysis  . Hemorrhagic cystitis 04/28/2011    Due to previous radiation for PSA recurrence of prostate cancer    Physical Exam:  General:awake ans alert. Lungs - Normal respiratory effort, chest expands symmetrically.  Abdomen - Soft, non-tender & non-distended.  Lab Results:  Basename 06/04/11 0645 06/04/11 0425 06/03/11 1625  WBC 8.4 10.0 15.9*    HGB 6.7* 6.6* 7.9*  HCT 20.3* 20.3* 23.7*   BMET  Basename 06/03/11 0520 06/02/11 1810 06/02/11 1500  NA 129* -- 128*  K 3.9 4.1 --  CL 97 -- 96  CO2 23 -- 24  GLUCOSE 95 -- 87  BUN 15 -- 15  CREATININE 0.90 -- 0.85  CALCIUM 8.0* -- 8.3*   No results found for this basename: LABURIN:1 in the last 72 hours Results for orders placed during the hospital encounter of 06/02/11  CULTURE, ROUTINE-ABSCESS     Status: Normal (Preliminary result)   Collection Time   06/03/11 12:53 PM      Component Value Range Status Comment   Specimen Description ABSCESS PELVIC   Final    Special Requests Normal   Final    Gram Stain     Final    Value: FEW WBC PRESENT,BOTH PMN AND MONONUCLEAR     FEW SQUAMOUS EPITHELIAL CELLS PRESENT     NO ORGANISMS SEEN   Culture NO GROWTH 1 DAY   Final    Report Status PENDING   Incomplete     Studies/Results: @RISRSLT24 @  Assessment/Plan: Much improved today clinically. Will hold off on plan  For. hyperbaric chamber. Awaiting c/s. Continue antibiotics.  [name] I 06/04/2011, 12:10 PM

## 2011-06-04 NOTE — Progress Notes (Signed)
Patient seen and examined.  Agree with PA's note.  Cultures pending.

## 2011-06-04 NOTE — Progress Notes (Signed)
Subjective: Getting blood feels better, very appreciative of any help.  Objective: Vital signs in last 24 hours: Temp:  [97.3 F (36.3 C)-98.5 F (36.9 C)] 97.5 F (36.4 C) (05/17 1205) Pulse Rate:  [67-84] 72  (05/17 1205) Resp:  [18-20] 18  (05/17 1205) BP: (89-127)/(49-72) 127/69 mmHg (05/17 1205) SpO2:  [96 %-99 %] 97 % (05/17 1055) Weight:  [73.8 kg (162 lb 11.2 oz)] 73.8 kg (162 lb 11.2 oz) (05/17 0537) Last BM Date: 06/03/11  15 ml from drain, VSS, H/H  Still down  Intake/Output from previous day: 05/16 0701 - 05/17 0700 In: 120 [I.V.:120] Out: 2016 [Urine:2000; Drains:15; Stool:1] Intake/Output this shift: Total I/O In: 250 [Blood:250] Out: 600 [Urine:600]  General appearance: alert, cooperative and no distress GI: soft, non-tender; bowel sounds normal; no masses,  no organomegaly Drain shows brown color purulent drainage.  Lab Results:   Basename 06/04/11 0645 06/04/11 0425  WBC 8.4 10.0  HGB 6.7* 6.6*  HCT 20.3* 20.3*  PLT 251 262    BMET  Basename 06/03/11 0520 06/02/11 1810 06/02/11 1500  NA 129* -- 128*  K 3.9 4.1 --  CL 97 -- 96  CO2 23 -- 24  GLUCOSE 95 -- 87  BUN 15 -- 15  CREATININE 0.90 -- 0.85  CALCIUM 8.0* -- 8.3*   PT/INR  Basename 06/02/11 1500  LABPROT 14.0  INR 1.06    No results found for this basename: AST:5,ALT:5,ALKPHOS:5,BILITOT:5,PROT:5,ALBUMIN:5 in the last 168 hours   Lipase  No results found for this basename: lipase     Studies/Results: Ct Guided Abscess Drain  06/03/2011  *RADIOLOGY REPORT*  Clinical Data: Pelvic abscess, elevated white count, pain  CT GUIDED TRANSGLUTEAL PELVIC ABSCESS DRAINAGE  Date:  06/03/2011 10:15:00  Radiologist:  [name]. Ruel Favors, M.D.  Medications:  200 mcg Fentanyl  Guidance:  CT  Fluoroscopy time:  None.  Sedation time:  10 minutes  Contrast volume:  None.  Complications:  No immediate  PROCEDURE/FINDINGS:  Informed consent was obtained from the patient following explanation of the  procedure, risks, benefits and alternatives. The patient understands, agrees and consents for the procedure. All questions were addressed.  A time out was performed.  Maximal barrier sterile technique utilized including caps, mask, sterile gowns, sterile gloves, large sterile drape, hand hygiene, and betadine  Previous imaging reviewed.  The patient was positioned prone. Noncontrast localization CT performed.  The pelvic fluid collection was localized.  Under sterile conditions and local anesthesia, an 18 gauge 15 cm access needle was advanced from a right transgluteal approach into the collection.  Syringe aspiration yielded blood tinged purulent fluid.  Sample sent for Gram stain and culture. Guide wire advanced followed by dilatation to insert a 10-French drain.  Drain catheter position confirmed with CT.  Catheter connected to an external suction bulb.  Catheter was secured at the skin with a Prolene suture.  Sterile dressing applied.  No immediate complication.  The patient tolerated the procedure well. 20 ml pleural fluid removed initially.  IMPRESSION: Successful CT guided right transgluteal pelvic abscess drainage  Original Report Authenticated By: [name]. Ruel Favors, M.D.    Medications:    . sodium chloride  500 mL Intravenous Once  . acetaminophen  650 mg Oral Once  . amiodarone  200 mg Oral BID  . diphenhydrAMINE  25 mg Oral Once  . ertapenem  1 g Intravenous Q24H  . ferrous sulfate  325 mg Oral TID WC  . folic acid  1 mg Oral Daily  .  irbesartan  75 mg Oral Daily  . metoprolol succinate  25 mg Oral QPM  . mulitivitamin with minerals  1 tablet Oral Daily  . pantoprazole  40 mg Oral Q1200  . simvastatin  40 mg Oral QHS  . sodium chloride  3 mL Intravenous Q12H  . thiamine  100 mg Oral Daily  . cyanocobalamin  100 mcg Oral Daily  . DISCONTD: ciprofloxacin  400 mg Intravenous Q12H  . DISCONTD: metronidazole  500 mg Intravenous Q8H    Assessment/Plan 1. Intra-abdominal abscess by CT.  No prior history of diverticulitis or diverticulosis. Purulent drainage from site after CT guided drain placement 06/03/11.  Not sure is this is from bladder or diverticulum. Invanz added yesterday. 2. History of prostate cancer, status post prostatectomy, radiation therapy, hemorrhagic cystitis, ongoing leakage around his foley.  3. Recent hospitalization with hypoxia and acute respiratory failure requiring ventilator support.  4. S/P aortic valve replacement, September 2012  5. Atrial fibrillation controlled with metoprolol and amiodarone.  6. Recent congestive heart failure  7. History of alcohol use and encephalopathy, recurrent DT'S  8. History of stroke with occluded left carotid, status post right carotid endarterectomy.  9.Hyperkalemia  10.Hyponatremia   Plan:  Antibiotics, start some clears, but go slow with diet.   LOS: 2 days    Lamine Laton 06/04/2011

## 2011-06-04 NOTE — Progress Notes (Addendum)
Subjective: Patient is feeling better today especially after transfusion reports that he feels less fatigued.  No acute issues overnight.  Patient also mentions that he is not having any leak of hematuria at urethral catheter site.  Mentions that catheter size was changed and he has not noticed any more hematuria.  Objective: Filed Vitals:   06/04/11 1504 06/04/11 1520 06/04/11 1605 06/04/11 1705  BP: 123/70 122/68 100/61 124/74  Pulse: 70 70 67 70  Temp: 98.6 F (37 C) 97.3 F (36.3 C) 97.5 F (36.4 C) 97.3 F (36.3 C)  TempSrc: Oral Axillary Oral Oral  Resp: 18 20 20 18   Height:      Weight:      SpO2:       Weight change: -0.046 kg (-1.6 oz)  Intake/Output Summary (Last 24 hours) at 06/04/11 1803 Last data filed at 06/04/11 1540  Gross per 24 hour  Intake     75 ml  Output   2340 ml  Net  -2265 ml    General: Alert, awake, oriented x3, in no acute distress.  HEENT: No bruits, no goiter.  Heart: Regular rate and rhythm, without murmurs, rubs, gallops.  Lungs: Clear to auscultation, bilateral air movement.  GU: New catheter in place.  No blood noticed at urethral meatus Abdomen: Soft, nontender, nondistended, positive bowel sounds.  Neuro: Grossly intact, nonfocal. Back:  Percutaneous drain inplace   Lab Results:  Basename 06/03/11 0520 06/02/11 1810 06/02/11 1500  NA 129* -- 128*  K 3.9 4.1 --  CL 97 -- 96  CO2 23 -- 24  GLUCOSE 95 -- 87  BUN 15 -- 15  CREATININE 0.90 -- 0.85  CALCIUM 8.0* -- 8.3*  MG -- -- --  PHOS -- -- --   No results found for this basename: AST:2,ALT:2,ALKPHOS:2,BILITOT:2,PROT:2,ALBUMIN:2 in the last 72 hours No results found for this basename: LIPASE:2,AMYLASE:2 in the last 72 hours  Basename 06/04/11 0645 06/04/11 0425 06/02/11 1500  WBC 8.4 10.0 --  NEUTROABS -- -- 9.8*  HGB 6.7* 6.6* --  HCT 20.3* 20.3* --  MCV 87.1 87.5 --  PLT 251 262 --   No results found for this basename: CKTOTAL:3,CKMB:3,CKMBINDEX:3,TROPONINI:3 in the  last 72 hours No components found with this basename: POCBNP:3 No results found for this basename: DDIMER:2 in the last 72 hours No results found for this basename: HGBA1C:2 in the last 72 hours No results found for this basename: CHOL:2,HDL:2,LDLCALC:2,TRIG:2,CHOLHDL:2,LDLDIRECT:2 in the last 72 hours No results found for this basename: TSH,T4TOTAL,FREET3,T3FREE,THYROIDAB in the last 72 hours No results found for this basename: VITAMINB12:2,FOLATE:2,FERRITIN:2,TIBC:2,IRON:2,RETICCTPCT:2 in the last 72 hours  Micro Results: Recent Results (from the past 240 hour(s))  CULTURE, ROUTINE-ABSCESS     Status: Normal (Preliminary result)   Collection Time   06/03/11 12:53 PM      Component Value Range Status Comment   Specimen Description ABSCESS PELVIC   Final    Special Requests Normal   Final    Gram Stain     Final    Value: FEW WBC PRESENT,BOTH PMN AND MONONUCLEAR     FEW SQUAMOUS EPITHELIAL CELLS PRESENT     NO ORGANISMS SEEN   Culture NO GROWTH 1 DAY   Final    Report Status PENDING   Incomplete     Studies/Results: Ct Guided Abscess Drain  06/03/2011  *RADIOLOGY REPORT*  Clinical Data: Pelvic abscess, elevated white count, pain  CT GUIDED TRANSGLUTEAL PELVIC ABSCESS DRAINAGE  Date:  06/03/2011 10:15:00  Radiologist:  [name]. Beryle Beams  Miles Costain, M.D.  Medications:  200 mcg Fentanyl  Guidance:  CT  Fluoroscopy time:  None.  Sedation time:  10 minutes  Contrast volume:  None.  Complications:  No immediate  PROCEDURE/FINDINGS:  Informed consent was obtained from the patient following explanation of the procedure, risks, benefits and alternatives. The patient understands, agrees and consents for the procedure. All questions were addressed.  A time out was performed.  Maximal barrier sterile technique utilized including caps, mask, sterile gowns, sterile gloves, large sterile drape, hand hygiene, and betadine  Previous imaging reviewed.  The patient was positioned prone. Noncontrast localization CT  performed.  The pelvic fluid collection was localized.  Under sterile conditions and local anesthesia, an 18 gauge 15 cm access needle was advanced from a right transgluteal approach into the collection.  Syringe aspiration yielded blood tinged purulent fluid.  Sample sent for Gram stain and culture. Guide wire advanced followed by dilatation to insert a 10-French drain.  Drain catheter position confirmed with CT.  Catheter connected to an external suction bulb.  Catheter was secured at the skin with a Prolene suture.  Sterile dressing applied.  No immediate complication.  The patient tolerated the procedure well. 20 ml pleural fluid removed initially.  IMPRESSION: Successful CT guided right transgluteal pelvic abscess drainage  Original Report Authenticated By: [name]. Ruel Favors, M.D.    Medications: I have reviewed the patient's current medications.  Acute blood loss anemia (04/26/2011) -Secondary to hematuria that has been an on going problem for the patient after localized radiation therapy for his prostate cancer.   Patient's h/h dropped to 6.7 and he was subsequently transfused 2 units  Atrial fibrillation (04/28/2011) -Stable will plan on continuing current regimen.   Intra-abdominal abscess (06/02/2011) -May account for patient's leukocytosis.  Cipro and flagyl d/c'd yesterday and patient is now on invanz   ADENOCARCINOMA, PROSTATE (08/06/2008) Per Urology.   HYPERTENSION (11/09/2006) Has been stable. Will plan on continuing current regimen.   CORONARY ARTERY DISEASE (11/09/2006) We will continue to hold patient's aspirin while he has active hematuria. Will plan on discharging off of aspirin given his recurrent episodes of hematuria and have cardiology consider adding medication as outpatient.  S/P AVR (aortic valve replacement) (09/23/2010) Should the patient require surgery will have cardiology clear him. Otherwise will continue to observe as patient is stable currently.   Hemorrhagic  cystitis (04/28/2011) Urology is managing and has started patient on 3 way continuous irrigation with carboprost protocol.   Disposition: Currently awaiting for disposition from surgical standpoint given his recent abscess.     LOS: 2 days   Penny Pia M.D.  [facility] 06/04/2011, 6:03 PM

## 2011-06-05 DIAGNOSIS — E871 Hypo-osmolality and hyponatremia: Secondary | ICD-10-CM | POA: Diagnosis present

## 2011-06-05 DIAGNOSIS — I10 Essential (primary) hypertension: Secondary | ICD-10-CM

## 2011-06-05 DIAGNOSIS — N3080 Other cystitis without hematuria: Secondary | ICD-10-CM

## 2011-06-05 DIAGNOSIS — I359 Nonrheumatic aortic valve disorder, unspecified: Secondary | ICD-10-CM

## 2011-06-05 LAB — CBC
HCT: 27.5 % — ABNORMAL LOW (ref 39.0–52.0)
Hemoglobin: 9.1 g/dL — ABNORMAL LOW (ref 13.0–17.0)
MCH: 28.2 pg (ref 26.0–34.0)
MCHC: 33.1 g/dL (ref 30.0–36.0)
MCV: 85.1 fL (ref 78.0–100.0)
Platelets: 296 10*3/uL (ref 150–400)
RBC: 3.23 MIL/uL — ABNORMAL LOW (ref 4.22–5.81)
RDW: 17.3 % — ABNORMAL HIGH (ref 11.5–15.5)
WBC: 7.2 10*3/uL (ref 4.0–10.5)

## 2011-06-05 LAB — TYPE AND SCREEN
Antibody Screen: NEGATIVE
Unit division: 0

## 2011-06-05 LAB — BASIC METABOLIC PANEL
Chloride: 98 mEq/L (ref 96–112)
GFR calc Af Amer: >90 mL/min (ref >90)
GFR calc non Af Amer: 83 mL/min — ABNORMAL LOW (ref >90)
Potassium: 3.8 mEq/L (ref 3.5–5.1)
Sodium: 131 mEq/L — ABNORMAL LOW (ref 135–145)

## 2011-06-05 NOTE — Progress Notes (Signed)
  Subjective: Pt sitting up in chair; no new c/o  Objective: Vital signs in last 24 hours: Temp:  [97.3 F (36.3 C)-98.2 F (36.8 C)] 97.5 F (36.4 C) (05/18 1400) Pulse Rate:  [57-70] 57  (05/18 1400) Resp:  [18-20] 18  (05/18 1400) BP: (100-137)/(61-74) 117/64 mmHg (05/18 1400) SpO2:  [95 %-99 %] 99 % (05/18 1400) Last BM Date: 06/03/11  Intake/Output from previous day: 05/17 0701 - 05/18 0700 In: 200 [P.O.:170; Blood:25] Out: 1705 [Urine:1675; Drains:30] Intake/Output this shift: Total I/O In: -  Out: 1200 [Urine:1200]  Transgluteal/pelvic drain intact, insertion site ok, mildly tender, output about 30 cc's today cream colored fluid, cx's pend  Lab Results:   Basename 06/05/11 0850 06/04/11 2015  WBC 7.2 7.4  HGB 9.1* 8.5*  HCT 27.5* 25.4*  PLT 296 274   BMET  Basename 06/05/11 0850 06/03/11 0520  NA 131* 129*  K 3.8 3.9  CL 98 97  CO2 25 23  GLUCOSE 88 95  BUN 9 15  CREATININE 0.74 0.90  CALCIUM 7.7* 8.0*   PT/INR No results found for this basename: LABPROT:2,INR:2 in the last 72 hours ABG No results found for this basename: PHART:2,PCO2:2,PO2:2,HCO3:2 in the last 72 hours Results for orders placed during the hospital encounter of 06/02/11  CULTURE, ROUTINE-ABSCESS     Status: Normal (Preliminary result)   Collection Time   06/03/11 12:53 PM      Component Value Range Status Comment   Specimen Description ABSCESS PELVIC   Final    Special Requests Normal   Final    Gram Stain     Final    Value: FEW WBC PRESENT,BOTH PMN AND MONONUCLEAR     FEW SQUAMOUS EPITHELIAL CELLS PRESENT     NO ORGANISMS SEEN   Culture Culture reincubated for better growth   Final    Report Status PENDING   Incomplete     Studies/Results: No results found.  Anti-infectives: Anti-infectives     Start     Dose/Rate Route Frequency Ordered Stop   06/03/11 1800   ertapenem (INVANZ) 1 g in sodium chloride 0.9 % 50 mL IVPB        1 g 100 mL/hr over 30 Minutes Intravenous  Every 24 hours 06/03/11 1737     06/02/11 1945   ciprofloxacin (CIPRO) IVPB 400 mg  Status:  Discontinued        400 mg 200 mL/hr over 60 Minutes Intravenous Every 12 hours 06/02/11 1944 06/03/11 1737   06/02/11 1945   metroNIDAZOLE (FLAGYL) IVPB 500 mg  Status:  Discontinued        500 mg 100 mL/hr over 60 Minutes Intravenous Every 8 hours 06/02/11 1944 06/03/11 1737   06/02/11 1515   ciprofloxacin (CIPRO) IVPB 400 mg        400 mg 200 mL/hr over 60 Minutes Intravenous  Once 06/02/11 1514 06/02/11 1639   06/02/11 1515   metroNIDAZOLE (FLAGYL) IVPB 500 mg        500 mg 100 mL/hr over 60 Minutes Intravenous  Once 06/02/11 1514 06/02/11 1639          Assessment/Plan: s/p transgluteal/pelvic abscess drain 5/16; check final cx's, cont drain irrigation, check f/u CT next week with drain injection   LOS: 3 days    [name],D [facility] 06/05/2011

## 2011-06-05 NOTE — Progress Notes (Signed)
Subjective: No acute issues overnight.  Patient mentions that he is still noticing hematuria.  Foley still in place.  Denies any fever, chills, or nausea/abd pain, fatigue, HA  Objective: Filed Vitals:   06/04/11 1847 06/04/11 2041 06/05/11 0618 06/05/11 1400  BP: 128/69 121/63 137/71 117/64  Pulse: 67 70 65 57  Temp: 98.1 F (36.7 C) 98.2 F (36.8 C) 97.4 F (36.3 C) 97.5 F (36.4 C)  TempSrc: Oral Oral Oral Oral  Resp: 18 18 18 18   Height:      Weight:      SpO2:  95% 97% 99%   Weight change:   Intake/Output Summary (Last 24 hours) at 06/05/11 1544 Last data filed at 06/05/11 1300  Gross per 24 hour  Intake    125 ml  Output   1940 ml  Net  -1815 ml    General: Alert, awake, oriented x3, in no acute distress.  HEENT: No bruits, no goiter.  Heart: Regular rate and rhythm, without murmurs, rubs, gallops.  Lungs: Clear to auscultation, bilateral air movement.  GU: New catheter in place. No blood noticed at urethral meatus  Abdomen: Soft, nontender, nondistended, positive bowel sounds.  Neuro: Grossly intact, nonfocal.  Back: Percutaneous drain inplace  Lab Results:  Basename 06/05/11 0850 06/03/11 0520  NA 131* 129*  K 3.8 3.9  CL 98 97  CO2 25 23  GLUCOSE 88 95  BUN 9 15  CREATININE 0.74 0.90  CALCIUM 7.7* 8.0*  MG -- --  PHOS -- --   No results found for this basename: AST:2,ALT:2,ALKPHOS:2,BILITOT:2,PROT:2,ALBUMIN:2 in the last 72 hours No results found for this basename: LIPASE:2,AMYLASE:2 in the last 72 hours  Basename 06/05/11 0850 06/04/11 2015  WBC 7.2 7.4  NEUTROABS -- --  HGB 9.1* 8.5*  HCT 27.5* 25.4*  MCV 85.1 84.4  PLT 296 274   No results found for this basename: CKTOTAL:3,CKMB:3,CKMBINDEX:3,TROPONINI:3 in the last 72 hours No components found with this basename: POCBNP:3 No results found for this basename: DDIMER:2 in the last 72 hours No results found for this basename: HGBA1C:2 in the last 72 hours No results found for this basename:  CHOL:2,HDL:2,LDLCALC:2,TRIG:2,CHOLHDL:2,LDLDIRECT:2 in the last 72 hours No results found for this basename: TSH,T4TOTAL,FREET3,T3FREE,THYROIDAB in the last 72 hours No results found for this basename: VITAMINB12:2,FOLATE:2,FERRITIN:2,TIBC:2,IRON:2,RETICCTPCT:2 in the last 72 hours  Micro Results: Recent Results (from the past 240 hour(s))  CULTURE, ROUTINE-ABSCESS     Status: Normal (Preliminary result)   Collection Time   06/03/11 12:53 PM      Component Value Range Status Comment   Specimen Description ABSCESS PELVIC   Final    Special Requests Normal   Final    Gram Stain     Final    Value: FEW WBC PRESENT,BOTH PMN AND MONONUCLEAR     FEW SQUAMOUS EPITHELIAL CELLS PRESENT     NO ORGANISMS SEEN   Culture Culture reincubated for better growth   Final    Report Status PENDING   Incomplete     Studies/Results: No results found.  Medications: I have reviewed the patient's current medications.  Acute blood loss anemia (04/26/2011) -Secondary to hematuria that has been an on going problem for the patient after localized radiation therapy for his prostate cancer.  Patient's h/h dropped to 6.7 and he was subsequently transfused 2 units post hemoglovin 8.5 and today's hemoglobin 9.1   Atrial fibrillation (04/28/2011) -Stable will plan on continuing current regimen.   Intra-abdominal abscess (06/02/2011) -May account for patient's leukocytosis. Cipro  and flagyl d/c'd yesterday and patient is now on invanz   ADENOCARCINOMA, PROSTATE (08/06/2008) Per Urology.   HYPERTENSION (11/09/2006) Has been stable. Will plan on continuing current regimen.   CORONARY ARTERY DISEASE (11/09/2006) We will continue to hold patient's aspirin while he has active hematuria. Will plan on discharging off of aspirin given his recurrent episodes of hematuria and have cardiology consider adding medication as outpatient.   S/P AVR (aortic valve replacement) (09/23/2010) Should the patient require surgery will  have cardiology clear him. Otherwise will continue to observe as patient is stable currently.   Hemorrhagic cystitis (04/28/2011) Urology is managing and has started patient on 3 way continuous irrigation with carboprost protocol.   Disposition: Currently awaiting for disposition from surgical standpoint given his recent abscess.   Hyponatremia:   Initially as low as 128 but is improving and today is 131.  Suspect this was secondary to poor oral solute intake.  Currently is improving.  Will continue to monitor.    Hypocalcemia:  Mild and patient asymptomatic and likely related to recent history of transfusion.  Will monitor for now should calcium level drop below 7.5 or symptoms develop will plan on replacing.     LOS: 3 days   Penny Pia M.D.  [facility] 06/05/2011, 3:44 PM

## 2011-06-05 NOTE — Progress Notes (Signed)
Urology Progress Note  Subjective:     No acute urologic events overnight. Urine remains yellow to light pink on CBI/carboprost.  ROS: Negative: Chest pain  Objective:  Patient Vitals for the past 24 hrs:  BP Temp Temp src Pulse Resp SpO2  06/05/11 0618 137/71 mmHg 97.4 F (36.3 C) Oral 65  18  97 %  06/04/11 2041 121/63 mmHg 98.2 F (36.8 C) Oral 70  18  95 %  06/04/11 1847 128/69 mmHg 98.1 F (36.7 C) Oral 67  18  -  06/04/11 1805 121/70 mmHg 98 F (36.7 C) Oral 67  20  -  06/04/11 1705 124/74 mmHg 97.3 F (36.3 C) Oral 70  18  -  06/04/11 1605 100/61 mmHg 97.5 F (36.4 C) Oral 67  20  -  06/04/11 1520 122/68 mmHg 97.3 F (36.3 C) Axillary 70  20  -  06/04/11 1504 123/70 mmHg 98.6 F (37 C) Oral 70  18  -  06/04/11 1445 124/71 mmHg 97.2 F (36.2 C) Oral 72  20  -  06/04/11 1420 133/73 mmHg 98 F (36.7 C) Oral 70  18  99 %  06/04/11 1205 127/69 mmHg 97.5 F (36.4 C) Oral 72  18  -    Physical Exam: General:  No acute distress, awake Chest: Equal effort-B Abdomen:               []  Soft, appropriately TTP  [x]  Soft, NTTP, JP drain with cloudy yellow fluid  []  Soft, appropriately TTP, incision(s) clean/dry/intact  Genitourinary: 3- way foley in place, minimal genital drainage Foley:  Draining yellow to pink urine.    I/O last 3 completed shifts: In: 200 [P.O.:170; Blood:25; Other:5] Out: 3080 [Urine:3050; Drains:30]  Recent Labs  [facility] 06/05/11 0850 06/04/11 2015   HGB 9.1* 8.5*   WBC 7.2 7.4   PLT 296 274    Recent Labs  [facility] 06/05/11 0850 06/03/11 0520   NA 131* 129*   K 3.8 3.9   CL 98 97   CO2 25 23   BUN 9 15   CREATININE 0.74 0.90   CALCIUM 7.7* 8.0*   GFRNONAA 83* 77*   GFRAA >90 89*     Recent Labs  Basename 06/02/11 1500   INR 1.06   APTT 31     No components found with this basename: ABG:2    Length of stay: 3 days.  Assessment: Gross hematuria.  Plan: -Hematuria responding well to carbaprost. Would recommend  continuing this medication alternating with CBI. -Will check on urine again tomorrow to determine whether or not to continue intravesical therapy.   Natalia Leatherwood, MD [phone]

## 2011-06-06 DIAGNOSIS — E871 Hypo-osmolality and hyponatremia: Secondary | ICD-10-CM

## 2011-06-06 DIAGNOSIS — I10 Essential (primary) hypertension: Secondary | ICD-10-CM

## 2011-06-06 DIAGNOSIS — I359 Nonrheumatic aortic valve disorder, unspecified: Secondary | ICD-10-CM

## 2011-06-06 DIAGNOSIS — N3080 Other cystitis without hematuria: Secondary | ICD-10-CM

## 2011-06-06 LAB — CBC
HCT: 26.3 % — ABNORMAL LOW (ref 39.0–52.0)
Hemoglobin: 8.6 g/dL — ABNORMAL LOW (ref 13.0–17.0)
MCHC: 32.7 g/dL (ref 30.0–36.0)
RBC: 3.07 MIL/uL — ABNORMAL LOW (ref 4.22–5.81)
WBC: 6.2 10*3/uL (ref 4.0–10.5)

## 2011-06-06 LAB — BASIC METABOLIC PANEL
BUN: 7 mg/dL (ref 6–23)
Chloride: 97 mEq/L (ref 96–112)
GFR calc non Af Amer: 83 mL/min — ABNORMAL LOW (ref >90)
Glucose, Bld: 134 mg/dL — ABNORMAL HIGH (ref 70–99)
Potassium: 3.3 mEq/L — ABNORMAL LOW (ref 3.5–5.1)
Sodium: 130 mEq/L — ABNORMAL LOW (ref 135–145)

## 2011-06-06 MED ORDER — POTASSIUM CHLORIDE CRYS ER 20 MEQ PO TBCR
40.0000 meq | EXTENDED_RELEASE_TABLET | Freq: Once | ORAL | Status: AC
  Administered 2011-06-06: 40 meq via ORAL
  Filled 2011-06-06: qty 2

## 2011-06-06 NOTE — Progress Notes (Signed)
Patient ID: [name], male   DOB: [birth year], 78 y.o.   MRN: [id number] [facility] Surgery Progress Note:   * No surgery found *  Subjective: Mental status is clear.  Asking about going home Objective: Vital signs in last 24 hours: Temp:  [97.5 F (36.4 C)-97.9 F (36.6 C)] 97.8 F (36.6 C) (05/19 0447) Pulse Rate:  [57-61] 61  (05/19 0447) Resp:  [18-19] 18  (05/19 0447) BP: (111-150)/(58-75) 111/58 mmHg (05/19 0447) SpO2:  [98 %-99 %] 98 % (05/19 0447) Weight:  [165 lb 12.6 oz (75.2 kg)] 165 lb 12.6 oz (75.2 kg) (05/19 0447)  Intake/Output from previous day: 05/18 0701 - 05/19 0700 In: 530 [P.O.:520] Out: 2825 [Urine:2800; Drains:25] Intake/Output this shift:    Physical Exam: Work of breathing is  Normal.  Right JP drain has scant brown material-purulent  Lab Results:  Results for orders placed during the hospital encounter of 06/02/11 (from the past 48 hour(s))  CBC     Status: Abnormal   Collection Time   06/04/11  8:15 PM      Component Value Range Comment   WBC 7.4  4.0 - 10.5 (K/uL)    RBC 3.01 (*) 4.22 - 5.81 (MIL/uL)    Hemoglobin 8.5 (*) 13.0 - 17.0 (g/dL)    HCT 63.8 (*) 75.6 - 52.0 (%)    MCV 84.4  78.0 - 100.0 (fL)    MCH 28.2  26.0 - 34.0 (pg)    MCHC 33.5  30.0 - 36.0 (g/dL)    RDW 43.3 (*) 29.5 - 15.5 (%)    Platelets 274  150 - 400 (K/uL)   CBC     Status: Abnormal   Collection Time   06/05/11  8:50 AM      Component Value Range Comment   WBC 7.2  4.0 - 10.5 (K/uL)    RBC 3.23 (*) 4.22 - 5.81 (MIL/uL)    Hemoglobin 9.1 (*) 13.0 - 17.0 (g/dL)    HCT 18.8 (*) 41.6 - 52.0 (%)    MCV 85.1  78.0 - 100.0 (fL)    MCH 28.2  26.0 - 34.0 (pg)    MCHC 33.1  30.0 - 36.0 (g/dL)    RDW 60.6 (*) 30.1 - 15.5 (%)    Platelets 296  150 - 400 (K/uL)   BASIC METABOLIC PANEL     Status: Abnormal   Collection Time   06/05/11  8:50 AM      Component Value Range Comment   Sodium 131 (*) 135 - 145 (mEq/L)    Potassium 3.8  3.5 - 5.1 (mEq/L)    Chloride 98  96  - 112 (mEq/L)    CO2 25  19 - 32 (mEq/L)    Glucose, Bld 88  70 - 99 (mg/dL)    BUN 9  6 - 23 (mg/dL)    Creatinine, Ser 6.01  0.50 - 1.35 (mg/dL)    Calcium 7.7 (*) 8.4 - 10.5 (mg/dL)    GFR calc non Af Amer 83 (*) >90 (mL/min)    GFR calc Af Amer >90  >90 (mL/min)   CBC     Status: Abnormal   Collection Time   06/06/11  4:11 AM      Component Value Range Comment   WBC 6.2  4.0 - 10.5 (K/uL)    RBC 3.07 (*) 4.22 - 5.81 (MIL/uL)    Hemoglobin 8.6 (*) 13.0 - 17.0 (g/dL)    HCT 09.3 (*) 23.5 - 52.0 (%)  MCV 85.7  78.0 - 100.0 (fL)    MCH 28.0  26.0 - 34.0 (pg)    MCHC 32.7  30.0 - 36.0 (g/dL)    RDW 16.1 (*) 09.6 - 15.5 (%)    Platelets 328  150 - 400 (K/uL)   BASIC METABOLIC PANEL     Status: Abnormal   Collection Time   06/06/11  4:11 AM      Component Value Range Comment   Sodium 130 (*) 135 - 145 (mEq/L)    Potassium 3.3 (*) 3.5 - 5.1 (mEq/L)    Chloride 97  96 - 112 (mEq/L)    CO2 25  19 - 32 (mEq/L)    Glucose, Bld 134 (*) 70 - 99 (mg/dL)    BUN 7  6 - 23 (mg/dL)    Creatinine, Ser 0.45  0.50 - 1.35 (mg/dL)    Calcium 8.0 (*) 8.4 - 10.5 (mg/dL)    GFR calc non Af Amer 83 (*) >90 (mL/min)    GFR calc Af Amer >90  >90 (mL/min)     Radiology/Results: No results found.  Anti-infectives: Anti-infectives     Start     Dose/Rate Route Frequency Ordered Stop   06/03/11 1800   ertapenem (INVANZ) 1 g in sodium chloride 0.9 % 50 mL IVPB        1 g 100 mL/hr over 30 Minutes Intravenous Every 24 hours 06/03/11 1737     06/02/11 1945   ciprofloxacin (CIPRO) IVPB 400 mg  Status:  Discontinued        400 mg 200 mL/hr over 60 Minutes Intravenous Every 12 hours 06/02/11 1944 06/03/11 1737   06/02/11 1945   metroNIDAZOLE (FLAGYL) IVPB 500 mg  Status:  Discontinued        500 mg 100 mL/hr over 60 Minutes Intravenous Every 8 hours 06/02/11 1944 06/03/11 1737   06/02/11 1515   ciprofloxacin (CIPRO) IVPB 400 mg        400 mg 200 mL/hr over 60 Minutes Intravenous  Once 06/02/11  1514 06/02/11 1639   06/02/11 1515   metroNIDAZOLE (FLAGYL) IVPB 500 mg        500 mg 100 mL/hr over 60 Minutes Intravenous  Once 06/02/11 1514 06/02/11 1639          Assessment/Plan: Problem List: Patient Active Problem List  Diagnoses  . ADENOCARCINOMA, PROSTATE  . HYPERTENSION  . CORONARY ARTERY DISEASE  . GERD  . CEREBROVASCULAR ACCIDENT, HX OF  . S/P AVR (aortic valve replacement)  . Hypertension  . Acute blood loss anemia  . E-coli UTI  . Hemorrhagic cystitis  . Atrial fibrillation  . ARF (acute renal failure)  . Protein-calorie malnutrition, severe  . Intra-abdominal abscess  . Hypocalcemia  . Hyponatremia    Source of pelvic abscess unclear but may be from diverticulitis process * No surgery found *    LOS: 4 days   Matt B. Daphine Deutscher, MD, [facility] [phone] beeper [phone]  06/06/2011 8:00 AM

## 2011-06-06 NOTE — Progress Notes (Signed)
Subjective: Patient mentions that he feels about the same today.  He is still upset about his continued hematuria and he had a lot of questions regarding his on going hematuria.  Also was asking about being able to eat some more.  Otherwise no acute issues reported.  Objective: Filed Vitals:   06/05/11 1400 06/05/11 2033 06/06/11 0447 06/06/11 1239  BP: 117/64 150/75 111/58 166/74  Pulse: 57 60 61 56  Temp: 97.5 F (36.4 C) 97.9 F (36.6 C) 97.8 F (36.6 C) 97.3 F (36.3 C)  TempSrc: Oral Oral Oral Oral  Resp: 18 19 18 18   Height:      Weight:   75.2 kg (165 lb 12.6 oz)   SpO2: 99% 98% 98% 100%   Weight change:   Intake/Output Summary (Last 24 hours) at 06/06/11 1426 Last data filed at 06/06/11 1100  Gross per 24 hour  Intake    370 ml  Output   2550 ml  Net  -2180 ml   General: Alert, awake, oriented x3, in no acute distress.  HEENT: No bruits, no goiter.  Heart: Regular rate and rhythm, without murmurs, rubs, gallops.  Lungs: Clear to auscultation, bilateral air movement.  GU: Catheter in place. No blood noticed at urethral meatus  Abdomen: Soft, nontender, nondistended, positive bowel sounds. Pelvic drain in place Neuro: Grossly intact, nonfocal.  Back: Percutaneous drain inplace   Lab Results:  Basename 06/06/11 0411 06/05/11 0850  NA 130* 131*  K 3.3* 3.8  CL 97 98  CO2 25 25  GLUCOSE 134* 88  BUN 7 9  CREATININE 0.73 0.74  CALCIUM 8.0* 7.7*  MG -- --  PHOS -- --   No results found for this basename: AST:2,ALT:2,ALKPHOS:2,BILITOT:2,PROT:2,ALBUMIN:2 in the last 72 hours No results found for this basename: LIPASE:2,AMYLASE:2 in the last 72 hours  Basename 06/06/11 0411 06/05/11 0850  WBC 6.2 7.2  NEUTROABS -- --  HGB 8.6* 9.1*  HCT 26.3* 27.5*  MCV 85.7 85.1  PLT 328 296   No results found for this basename: CKTOTAL:3,CKMB:3,CKMBINDEX:3,TROPONINI:3 in the last 72 hours No components found with this basename: POCBNP:3 No results found for this  basename: DDIMER:2 in the last 72 hours No results found for this basename: HGBA1C:2 in the last 72 hours No results found for this basename: CHOL:2,HDL:2,LDLCALC:2,TRIG:2,CHOLHDL:2,LDLDIRECT:2 in the last 72 hours No results found for this basename: TSH,T4TOTAL,FREET3,T3FREE,THYROIDAB in the last 72 hours No results found for this basename: VITAMINB12:2,FOLATE:2,FERRITIN:2,TIBC:2,IRON:2,RETICCTPCT:2 in the last 72 hours  Micro Results: Recent Results (from the past 240 hour(s))  CULTURE, ROUTINE-ABSCESS     Status: Normal (Preliminary result)   Collection Time   06/03/11 12:53 PM      Component Value Range Status Comment   Specimen Description ABSCESS PELVIC   Final    Special Requests Normal   Final    Gram Stain     Final    Value: FEW WBC PRESENT,BOTH PMN AND MONONUCLEAR     FEW SQUAMOUS EPITHELIAL CELLS PRESENT     NO ORGANISMS SEEN   Culture FEW GRAM NEGATIVE RODS   Final    Report Status PENDING   Incomplete     Studies/Results: No results found.  Medications: I have reviewed the patient's current medications.   Acute blood loss anemia (04/26/2011) -Secondary to hematuria that has been an on going problem for the patient after localized radiation therapy for his prostate cancer. Urology on board for hematuria.  Patient's h/h dropped to 6.7 and he was subsequently transfused 2  units post hemoglovin 8.5 and today's hemoglobin has had slight drop today from 9.1 to  8.6 today.  With continued blood loss may have to transfuse again. Continue to monitor CBC's.  Atrial fibrillation (04/28/2011) -Stable will plan on continuing current regimen.   Intra-abdominal abscess (06/02/2011) -May account for patient's original leukocytosis. Cipro and flagyl d/c'd  and patient is now on invanz.   -IR following as drain still in place.  ADENOCARCINOMA, PROSTATE (08/06/2008) Per Urology.   HYPERTENSION (11/09/2006) Has been stable. Will plan on continuing current regimen.   CORONARY ARTERY  DISEASE (11/09/2006) We will continue to hold patient's aspirin while he has active  hematuria. Will plan on discharging off of aspirin given his recurrent episodes of hematuria and have cardiology consider adding medication as outpatient.   S/P AVR (aortic valve replacement) (09/23/2010) Should the patient require surgery will have cardiology clear him. Otherwise will continue to observe as patient is stable currently.   Hemorrhagic cystitis (04/28/2011) Urology is managing and has started patient on 3 way continuous irrigation with carboprost protocol.   Disposition: Currently awaiting for disposition from surgical standpoint given his recent abscess. Will have a better idea once drain is removed and repeat CT of abdomen and pelvis is obtained.    Hyponatremia:  Initially as low as 128 but is improving and today is 130. Suspect this was secondary to poor oral solute intake. Currently is improving. Will continue to monitor.   Hypocalcemia: Mild and patient asymptomatic and likely related to recent history of transfusion. Will monitor for now should calcium level drop below 7.5 or symptoms develop will plan on replacing.   Hypokalemia: Oral replace today.  Value 3.3 will replace and continue to monitor.  Likely related to decreased oral intake.    LOS: 4 days   Penny Pia M.D.  [facility] 06/06/2011, 2:26 PM

## 2011-06-06 NOTE — Progress Notes (Signed)
Urology Progress Note  Subjective:     No acute urologic events overnight. Urine light pink to clear when CBI running, but becomes more red when carboprost running.  ROS: Negative: Chest pain  Objective:  Patient Vitals for the past 24 hrs:  BP Temp Temp src Pulse Resp SpO2 Weight  06/06/11 0447 111/58 mmHg 97.8 F (36.6 C) Oral 61  18  98 % 75.2 kg (165 lb 12.6 oz)  06/05/11 2033 150/75 mmHg 97.9 F (36.6 C) Oral 60  19  98 % -  06/05/11 1400 117/64 mmHg 97.5 F (36.4 C) Oral 57  18  99 % -    Physical Exam: General:  No acute distress, awake Chest: Equal effort-B Abdomen:               []  Soft, appropriately TTP  [x]  Soft, NTTP, JP scant purulent fluid  []  Soft, appropriately TTP, incision(s) clean/dry/intact  Genitourinary: 3- way foley in place, minimal genital drainage Foley:  Draining yellow to pink urine.    I/O last 3 completed shifts: In: 535 [P.O.:520; Other:15] Out: 3365 [Urine:3325; Drains:40]  Recent Labs  [facility] 06/06/11 0411 06/05/11 0850   HGB 8.6* 9.1*   WBC 6.2 7.2   PLT 328 296    Recent Labs  Basename 06/06/11 0411 06/05/11 0850   NA 130* 131*   K 3.3* 3.8   CL 97 98   CO2 25 25   BUN 7 9   CREATININE 0.73 0.74   CALCIUM 8.0* 7.7*   GFRNONAA 83* 83*   GFRAA >90 >90     No results found for this basename: PT:2,INR:2,APTT:2 in the last 72 hours   No components found with this basename: ABG:2    Length of stay: 4 days.  Assessment: Gross hematuria.  Plan: -Hematuria persists.  -Continue carbaprost.  -May need hyperbaric therapy.   Natalia Leatherwood, MD [phone]

## 2011-06-07 DIAGNOSIS — I10 Essential (primary) hypertension: Secondary | ICD-10-CM

## 2011-06-07 DIAGNOSIS — E871 Hypo-osmolality and hyponatremia: Secondary | ICD-10-CM

## 2011-06-07 DIAGNOSIS — N3080 Other cystitis without hematuria: Secondary | ICD-10-CM

## 2011-06-07 DIAGNOSIS — I359 Nonrheumatic aortic valve disorder, unspecified: Secondary | ICD-10-CM

## 2011-06-07 LAB — CBC
HCT: 27.1 % — ABNORMAL LOW (ref 39.0–52.0)
Hemoglobin: 8.8 g/dL — ABNORMAL LOW (ref 13.0–17.0)
MCH: 28.1 pg (ref 26.0–34.0)
RBC: 3.13 MIL/uL — ABNORMAL LOW (ref 4.22–5.81)

## 2011-06-07 LAB — CULTURE, ROUTINE-ABSCESS

## 2011-06-07 LAB — GLUCOSE, CAPILLARY: Glucose-Capillary: 98 mg/dL (ref 70–99)

## 2011-06-07 MED ORDER — METOPROLOL SUCCINATE ER 25 MG PO TB24
25.0000 mg | ORAL_TABLET | Freq: Every evening | ORAL | Status: DC
  Administered 2011-06-08 – 2011-06-10 (×3): 25 mg via ORAL
  Filled 2011-06-07 (×5): qty 1

## 2011-06-07 MED ORDER — CIPROFLOXACIN IN D5W 400 MG/200ML IV SOLN
400.0000 mg | Freq: Two times a day (BID) | INTRAVENOUS | Status: DC
  Administered 2011-06-07 – 2011-06-10 (×6): 400 mg via INTRAVENOUS
  Filled 2011-06-07 (×6): qty 200

## 2011-06-07 NOTE — Progress Notes (Signed)
  Subjective: Patient reports feeling much better  Objective: Vital signs in last 24 hours: Temp:  [97.2 F (36.2 C)-98 F (36.7 C)] 98 F (36.7 C) (05/20 1349) Pulse Rate:  [53-62] 53  (05/20 1600) Resp:  [16-18] 18  (05/20 1349) BP: (118-149)/(68-72) 149/72 mmHg (05/20 1349) SpO2:  [97 %-98 %] 97 % (05/20 1349) Weight:  [71.3 kg (157 lb 3 oz)] 71.3 kg (157 lb 3 oz) (05/20 0618)A  Intake/Output from previous day: 05/19 0701 - 05/20 0700 In: 330 [P.O.:320] Out: 4471 [Urine:4450; Drains:20; Stool:1] Intake/Output this shift: Total I/O In: 1925 [P.O.:120; Other:1805] Out: 2605 [Urine:2600; Drains:5]  Past Medical History  Diagnosis Date  . ADENOCARCINOMA, PROSTATE   . ALLERGIC RHINITIS   . B12 DEFICIENCY   . BENIGN PROSTATIC HYPERTROPHY   . CAROTID ARTERY DISEASE   . CEREBROVASCULAR ACCIDENT, HX OF   . CORONARY ARTERY DISEASE   . DIVERTICULOSIS, COLON   . GERD   . HEMORRHOIDS   . HIATAL HERNIA   . HYPERLIPIDEMIA   . HYPERTENSION   . OSTEOPENIA   . PEPTIC ULCER DISEASE   . PERIPHERAL VASCULAR DISEASE   . Personal history of alcoholism   . PROSTATE CANCER, HX OF   . PUD, HX OF   . TRANSIENT ISCHEMIC ATTACK   . Shingles   . Anemia   . AS (aortic stenosis)   . S/P AVR (aortic valve replacement) 09/23/2010  . Complication of anesthesia 09/23/2010    very confused after waking up. Stopped drinking 40 days before this surgery.  . Transfusion reaction, chill fever type 04/28/2011    Fever, rigors, tachycardia,tachypnea but no evidence of hemolysis  . Hemorrhagic cystitis 04/28/2011    Due to previous radiation for PSA recurrence of prostate cancer    Physical Exam:  General:thin w male, awake and alert. Lungs - Normal respiratory effort, chest expands symmetrically.  Abdomen - Soft, non-tender & non-distended. Drainage tube through gluteal area and foley in place.   Lab Results:  Basename 06/07/11 0920 06/06/11 0411 06/05/11 0850  WBC 5.5 6.2 7.2  HGB 8.8* 8.6*  9.1*  HCT 27.1* 26.3* 27.5*   BMET  Basename 06/06/11 0411 06/05/11 0850  NA 130* 131*  K 3.3* 3.8  CL 97 98  CO2 25 25  GLUCOSE 134* 88  BUN 7 9  CREATININE 0.73 0.74  CALCIUM 8.0* 7.7*   No results found for this basename: LABURIN:1 in the last 72 hours Results for orders placed during the hospital encounter of 06/02/11  CULTURE, ROUTINE-ABSCESS     Status: Normal   Collection Time   06/03/11 12:53 PM      Component Value Range Status Comment   Specimen Description ABSCESS PELVIC   Final    Special Requests Normal   Final    Gram Stain     Final    Value: FEW WBC PRESENT,BOTH PMN AND MONONUCLEAR     FEW SQUAMOUS EPITHELIAL CELLS PRESENT     NO ORGANISMS SEEN   Culture FEW ESCHERICHIA COLI   Final    Report Status 06/07/2011 FINAL   Final    Organism ID, Bacteria ESCHERICHIA COLI   Final     Studies/Results: @[id number] @  Assessment/Plan: Draining slowly. Gross hematuria continues, but slowly. Hgb stable today @ 8.8. No clots in urine tonight. Pt may need f/u x-ray: will discuss with Radiology tomorrow.  P: continue CBI.  Ellionna Buckbee I 06/07/2011, 5:32 PM

## 2011-06-07 NOTE — Progress Notes (Signed)
There was some confusion regarding if MD wanted to continue carboprost bladder irrigation nightly. Order was DC'ed yesterday because original order said for 4 doses but in progress notes yesterday MD noted to continue carboprost but no order written. MD on call called at 4a to clarify. Said to continue NS irrigation tonight and rounding MD's will address in am.  [name], [place] Edwards RN

## 2011-06-07 NOTE — Progress Notes (Signed)
Subjective: Feels better,   Objective: Vital signs in last 24 hours: Temp:  [97.2 F (36.2 C)-97.8 F (36.6 C)] 97.8 F (36.6 C) (05/20 0618) Pulse Rate:  [56-62] 62  (05/20 0618) Resp:  [16-18] 18  (05/20 0618) BP: (118-166)/(68-74) 118/70 mmHg (05/20 0618) SpO2:  [97 %-100 %] 97 % (05/20 0618) Weight:  [71.3 kg (157 lb 3 oz)] 71.3 kg (157 lb 3 oz) (05/20 0618) Last BM Date: 06/06/11  20 ml thru drain  Yesterday, 1 stool, still on clears, afebrile, VSS, E. Coli on culture. Drainage was a brownish purulent drainage on Friday, now it's serous colored but cloudy.    Intake/Output from previous day: 05/19 0701 - 05/20 0700 In: 330 [P.O.:320] Out: 4471 [Urine:4450; Drains:20; Stool:1] Intake/Output this shift: Total I/O In: 120 [P.O.:120] Out: -   General appearance: alert, cooperative and no distress GI: soft, non-tender; bowel sounds normal; no masses,  no organomegaly  Lab Results:   Basename 06/06/11 0411 06/05/11 0850  WBC 6.2 7.2  HGB 8.6* 9.1*  HCT 26.3* 27.5*  PLT 328 296    BMET  Basename 06/06/11 0411 06/05/11 0850  NA 130* 131*  K 3.3* 3.8  CL 97 98  CO2 25 25  GLUCOSE 134* 88  BUN 7 9  CREATININE 0.73 0.74  CALCIUM 8.0* 7.7*    ABSCESS PELVIC   Special Requests  Normal   Gram Stain  FEW WBC PRESENT,BOTH PMN AND MONONUCLEAR FEW SQUAMOUS EPITHELIAL CELLS PRESENT NO ORGANISMS SEEN   Culture  FEW ESCHERICHIA COLI   Report Status  06/07/2011 FINAL   Organism ID, Bacteria  ESCHERICHIA COLI   Resulting Agency  [facility]    Culture & Susceptibility     ESCHERICHIA COLI          Antibiotic  Sensitivity  Microscan  Status      AMPICILLIN  Resistant  >=32  Final      Method:  MIC      AMPICILLIN/SULBACTAM  Intermediate  16  Final      Method:  MIC      CEFAZOLIN  Sensitive  <=4  Final      Method:  MIC      CEFEPIME  Sensitive  <=1  Final      Method:  MIC      CEFOXITIN  Sensitive  <=4  Final      Method:  MIC      CEFTAZIDIME  Sensitive   <=1  Final      Method:  MIC      CEFTRIAXONE  Sensitive  <=1  Final      Method:  MIC      CIPROFLOXACIN  Sensitive  <=0.25  Final      Method:  MIC      GENTAMICIN  Sensitive  <=1  Final      Method:  MIC      IMIPENEM  Sensitive  <=0.25  Final      Method:  MIC      PIP/TAZO  Sensitive  <=4  Final      Method:  MIC      TOBRAMYCIN  Sensitive  <=1  Final      Method:  MIC      TRIMETH/SULFA  Sensitive  <=20  Final      Method:  MIC       Comments  ESCHERICHIA COLI (MIC)        FEW ESCHERICHIA COLI  Specimen Collected: 06/03/11 12:53 PM  Last Resulted: 06/07/11 7:33 AM            PT/INR No results found for this basename: LABPROT:2,INR:2 in the last 72 hours  No results found for this basename: AST:5,ALT:5,ALKPHOS:5,BILITOT:5,PROT:5,ALBUMIN:5 in the last 168 hours   Lipase  No results found for this basename: lipase     Studies/Results: No results found.  Medications:    . sodium chloride  500 mL Intravenous Once  . amiodarone  200 mg Oral BID  . atorvastatin  20 mg Oral QHS  . carboprost (HEMABATE) bladder irrigation - continuous   Bladder Irrigation Q24H  . ertapenem  1 g Intravenous Q24H  . ferrous sulfate  325 mg Oral TID WC  . folic acid  1 mg Oral Daily  . irbesartan  75 mg Oral Daily  . metoprolol succinate  25 mg Oral QPM  . mulitivitamin with minerals  1 tablet Oral Daily  . pantoprazole  40 mg Oral Q1200  . potassium chloride  40 mEq Oral Once  . sodium chloride  3 mL Intravenous Q12H  . thiamine  100 mg Oral Daily  . cyanocobalamin  100 mcg Oral Daily    Assessment/Plan 1. Intra-abdominal abscess by CT. No prior history of diverticulitis or diverticulosis. Purulent drainage from site after CT guided drain placement 06/03/11. Not sure is this is from bladder or diverticulum. Invanz added yesterday.  2. History of prostate cancer, status post prostatectomy, radiation therapy, hemorrhagic cystitis, ongoing leakage around his foley.  3.  Recent hospitalization with hypoxia and acute respiratory failure requiring ventilator support.  4. S/P aortic valve replacement, September 2012  5. Atrial fibrillation controlled with metoprolol and amiodarone.  6. Recent congestive heart failure  7. History of alcohol use and encephalopathy, recurrent DT'S  8. History of stroke with occluded left carotid, status post right carotid endarterectomy.  9.Hyperkalemia  10.Hyponatremia 11. Anemia   Plan:  Drain placed 4 days ago, will watch another 24-48 hours, and decide on repeat CT.  Advance to full liquids.  Discuss going to another antibiotic.     LOS: 5 days    [name] 06/07/2011

## 2011-06-07 NOTE — Progress Notes (Signed)
ANTIBIOTIC CONSULT NOTE - INITIAL  Pharmacy Consult for Cipro Indication: E.coli pelvic abscess  Allergies  Allergen Reactions  . Tolterodine Tartrate    Patient Measurements: Height: 5\' 11"  (180.3 cm) Weight: 157 lb 3 oz (71.3 kg) IBW/kg (Calculated) : 75.3   Vital Signs: Temp: 98 F (36.7 C) (05/20 1349) Temp src: Oral (05/20 1349) BP: 149/72 mmHg (05/20 1349) Pulse Rate: 53  (05/20 1600) Intake/Output from previous day: 05/19 0701 - 05/20 0700 In: 330 [P.O.:320] Out: 4471 [Urine:4450; Drains:20; Stool:1] Intake/Output from this shift: Total I/O In: 1925 [P.O.:120; Other:1805] Out: 2005 [Urine:2000; Drains:5]  Labs:  [facility] 06/07/11 0920 06/06/11 0411 06/05/11 0850  WBC 5.5 6.2 7.2  HGB 8.8* 8.6* 9.1*  PLT 361 328 296  LABCREA -- -- --  CREATININE -- 0.73 0.74   Estimated Creatinine Clearance: 70.6 ml/min (by C-G formula based on Cr of 0.73). No results found for this basename: VANCOTROUGH:2,VANCOPEAK:2,VANCORANDOM:2,GENTTROUGH:2,GENTPEAK:2,GENTRANDOM:2,TOBRATROUGH:2,TOBRAPEAK:2,TOBRARND:2,AMIKACINPEAK:2,AMIKACINTROU:2,AMIKACIN:2, in the last 72 hours   Assessment:  22 YOM with h/o prostate cancer with ongoing hematuria s/p localized radiation therapy.  Urology on board.  Patient with Intra-abdominal abscess (surgery on board). Unclear of source for pelvic abscess, possibly from diverticulitis process.  S/p transgluteal drain placement by IR 5/16.  Patient was on Ertapenem, and pelvic abscess culture grew E.coli.  MD d/c ertapenem and starting Cipro based on sensitivity panel.  CrCl 70 ml/min  Afebrile, WBC wnl.   Amiodarone on board, along with cipro, can prolong QTc interval.  QTc = 430 ms on 5/18.   Goal of Therapy:   Appropriate renal adjustment of Ciprofloxacin  Plan:   Ciprofloxacin 400 mg IV BID  Pharmacy will f/u when appropriate to transition to PO and QTc interval.   Geoffry Paradise Thi 06/07/2011,4:26 PM

## 2011-06-07 NOTE — Progress Notes (Signed)
CT wed.  Advance diet as tolerated.  Looks well.

## 2011-06-07 NOTE — Progress Notes (Signed)
Subjective: Pt still has major concerns regarding hematuria.  Otherwise is inquiring about diet.  Have been indicated that we can advance the patient's diet to day to full liquid diet.  Otherwise no acute other complaints.  Objective: Filed Vitals:   06/06/11 2102 06/07/11 0618 06/07/11 1349 06/07/11 1600  BP: 136/68 118/70 149/72   Pulse: 60 62 55 53  Temp: 97.2 F (36.2 C) 97.8 F (36.6 C) 98 F (36.7 C)   TempSrc: Oral Oral Oral   Resp: 16 18 18    Height:      Weight:  71.3 kg (157 lb 3 oz)    SpO2: 98% 97% 97%    Weight change: -3.9 kg (-8 lb 9.6 oz)  Intake/Output Summary (Last 24 hours) at 06/07/11 1621 Last data filed at 06/07/11 1500  Gross per 24 hour  Intake   2135 ml  Output   5551 ml  Net  -3416 ml    General: Alert, awake, oriented x3, in no acute distress.  HEENT: No bruits, no goiter.  Heart: Regular rate and rhythm, without murmurs, rubs, gallops.  Lungs: Clear to auscultation, bilateral air movement.  GU: Catheter in place. No blood noticed at urethral meatus  Abdomen: Soft, nontender, nondistended, positive bowel sounds. Pelvic drain in place  Neuro: Grossly intact, nonfocal.  Back: Percutaneous drain inplace  Lab Results:  Basename 06/06/11 0411 06/05/11 0850  NA 130* 131*  K 3.3* 3.8  CL 97 98  CO2 25 25  GLUCOSE 134* 88  BUN 7 9  CREATININE 0.73 0.74  CALCIUM 8.0* 7.7*  MG -- --  PHOS -- --   No results found for this basename: AST:2,ALT:2,ALKPHOS:2,BILITOT:2,PROT:2,ALBUMIN:2 in the last 72 hours No results found for this basename: LIPASE:2,AMYLASE:2 in the last 72 hours  Basename 06/07/11 0920 06/06/11 0411  WBC 5.5 6.2  NEUTROABS -- --  HGB 8.8* 8.6*  HCT 27.1* 26.3*  MCV 86.6 85.7  PLT 361 328   No results found for this basename: CKTOTAL:3,CKMB:3,CKMBINDEX:3,TROPONINI:3 in the last 72 hours No components found with this basename: POCBNP:3 No results found for this basename: DDIMER:2 in the last 72 hours No results found for  this basename: HGBA1C:2 in the last 72 hours No results found for this basename: CHOL:2,HDL:2,LDLCALC:2,TRIG:2,CHOLHDL:2,LDLDIRECT:2 in the last 72 hours No results found for this basename: TSH,T4TOTAL,FREET3,T3FREE,THYROIDAB in the last 72 hours No results found for this basename: VITAMINB12:2,FOLATE:2,FERRITIN:2,TIBC:2,IRON:2,RETICCTPCT:2 in the last 72 hours  Micro Results: Recent Results (from the past 240 hour(s))  CULTURE, ROUTINE-ABSCESS     Status: Normal   Collection Time   06/03/11 12:53 PM      Component Value Range Status Comment   Specimen Description ABSCESS PELVIC   Final    Special Requests Normal   Final    Gram Stain     Final    Value: FEW WBC PRESENT,BOTH PMN AND MONONUCLEAR     FEW SQUAMOUS EPITHELIAL CELLS PRESENT     NO ORGANISMS SEEN   Culture FEW ESCHERICHIA COLI   Final    Report Status 06/07/2011 FINAL   Final    Organism ID, Bacteria ESCHERICHIA COLI   Final     Studies/Results: No results found.  Medications: I have reviewed the patient's current medications.  Acute blood loss anemia (04/26/2011) -Secondary to hematuria that has been an on going problem for the patient after localized radiation therapy for his prostate cancer. Urology on board for hematuria.   Patient's h/h dropped to 6.7 and he was subsequently transfused 2  units post hemoglovin 8.5. Hemoglobin 8.8 today and is maintaining steady.   With continued blood loss may have to transfuse again. Continue to monitor CBC's.   Atrial fibrillation (04/28/2011) -Stable will plan on continuing current regimen. Patient has been bradycardic today and as such will have to hold B blocker.  Intra-abdominal abscess (06/02/2011) -Cultures show E. Coli with sensitivities.  Will transition to cipro today as organism is succeptible and we can easily transition to an oral regimen once patient is ready for discharge.  ADENOCARCINOMA, PROSTATE (08/06/2008) Per Urology.   HYPERTENSION (11/09/2006) Has been  stable. Will plan on continuing current regimen.   CORONARY ARTERY DISEASE (11/09/2006) We will continue to hold patient's aspirin while he has active  hematuria.   Will plan on discharging off of aspirin given his recurrent episodes of hematuria and have cardiology consider adding medication as outpatient.   S/P AVR (aortic valve replacement) (09/23/2010) Should the patient require surgery will have cardiology clear him. Otherwise will continue to observe as patient is stable currently.   Hemorrhagic cystitis (04/28/2011)  Urology is managing and has started patient on 3 way continuous irrigation with carboprost protocol.   Hyponatremia:  Initially as low as 128 but is improving and today is 130.   Suspect this was secondary to poor oral solute intake. Currently is improving. Will continue to monitor. Order BMP for tomorrow.  Disposition: Currently awaiting for disposition from surgical standpoint given his recent abscess. Will have a better idea once drain is removed and repeat CT of abdomen and pelvis is obtained.   Hypocalcemia: Mild and patient asymptomatic and likely related to recent history of transfusion. Will monitor for now should calcium level drop below 7.5 or symptoms develop will plan on replacing.   Hypokalemia: Oral replace today. Value 3.3 will replace and continue to monitor. Likely related to decreased oral intake.  Should improve once patient is able to get his diet advanced.    LOS: 5 days   Penny Pia M.D.  [facility] 06/07/2011, 4:21 PM

## 2011-06-07 NOTE — Progress Notes (Signed)
Subjective: Feeling better today.  Still with hematuria.   Objective: Vital signs in last 24 hours: Temp:  [97.2 F (36.2 C)-97.8 F (36.6 C)] 97.8 F (36.6 C) (05/20 0618) Pulse Rate:  [56-62] 62  (05/20 0618) Resp:  [16-18] 18  (05/20 0618) BP: (118-166)/(68-74) 118/70 mmHg (05/20 0618) SpO2:  [97 %-100 %] 97 % (05/20 0618) Weight:  [157 lb 3 oz (71.3 kg)] 157 lb 3 oz (71.3 kg) (05/20 0618) Last BM Date: 06/06/11  Intake/Output from previous day: 05/19 0701 - 05/20 0700 In: 330 [P.O.:320] Out: 4471 [Urine:4450; Drains:20; Stool:1] Intake/Output this shift: Total I/O In: 120 [P.O.:120] Out: -   PE: transgluteal drain placed by IR on 5/16 intact.  20 ml output 5/19, 25 ml so far today.  Cultures with few e.coli.  Mostly cloudy serous drainage in bulb.  Soft abdomen without distention. Positive bowel sounds.  Lab Results:   Basename 06/07/11 0920 06/06/11 0411  WBC 5.5 6.2  HGB 8.8* 8.6*  HCT 27.1* 26.3*  PLT 361 328   BMET  Basename 06/06/11 0411 06/05/11 0850  NA 130* 131*  K 3.3* 3.8  CL 97 98  CO2 25 25  GLUCOSE 134* 88  BUN 7 9  CREATININE 0.73 0.74  CALCIUM 8.0* 7.7*    Studies/Results:  *RADIOLOGY REPORT*   Clinical Data: Pelvic abscess, elevated white count, pain   CT GUIDED TRANSGLUTEAL PELVIC ABSCESS DRAINAGE   Date:  06/03/2011 10:15:00   Radiologist:  [name]. Ruel Favors, M.D.   Medications:  200 mcg Fentanyl   Guidance:  CT   Fluoroscopy time:  None.   Sedation time:  10 minutes   Contrast volume:  None.   Complications:  No immediate   PROCEDURE/FINDINGS:   Informed consent was obtained from the patient following explanation of the procedure, risks, benefits and alternatives. The patient understands, agrees and consents for the procedure. All questions were addressed.  A time out was performed.   Maximal barrier sterile technique utilized including caps, mask, sterile gowns, sterile gloves, large sterile drape, hand  hygiene, and betadine   Previous imaging reviewed.  The patient was positioned prone. Noncontrast localization CT performed.  The pelvic fluid collection was localized.  Under sterile conditions and local anesthesia, an 18 gauge 15 cm access needle was advanced from a right transgluteal approach into the collection.  Syringe aspiration yielded blood tinged purulent fluid.  Sample sent for Gram stain and culture. Guide wire advanced followed by dilatation to insert a 10-French drain.  Drain catheter position confirmed with CT.  Catheter connected to an external suction bulb.  Catheter was secured at the skin with a Prolene suture.  Sterile dressing applied.  No immediate complication.  The patient tolerated the procedure well. 20 ml pleural fluid removed initially.   IMPRESSION: Successful CT guided right transgluteal pelvic abscess drainage   Original Report Authenticated By: [name]. Ruel Favors, M.D.         Anti-infectives: Anti-infectives     Start     Dose/Rate Route Frequency Ordered Stop   06/03/11 1800   ertapenem (INVANZ) 1 g in sodium chloride 0.9 % 50 mL IVPB        1 g 100 mL/hr over 30 Minutes Intravenous Every 24 hours 06/03/11 1737     06/02/11 1945   ciprofloxacin (CIPRO) IVPB 400 mg  Status:  Discontinued        400 mg 200 mL/hr over 60 Minutes Intravenous Every 12 hours 06/02/11 1944 06/03/11 1737  06/02/11 1945   metroNIDAZOLE (FLAGYL) IVPB 500 mg  Status:  Discontinued        500 mg 100 mL/hr over 60 Minutes Intravenous Every 8 hours 06/02/11 1944 06/03/11 1737   06/02/11 1515   ciprofloxacin (CIPRO) IVPB 400 mg        400 mg 200 mL/hr over 60 Minutes Intravenous  Once 06/02/11 1514 06/02/11 1639   06/02/11 1515   metroNIDAZOLE (FLAGYL) IVPB 500 mg        500 mg 100 mL/hr over 60 Minutes Intravenous  Once 06/02/11 1514 06/02/11 1639          Assessment/Plan: S/p transgluteal drain placement by IR 5/16, output fair.  E.Coli on cultures - abx on  board. If output continues to be low over next 24-48 hours - recommend follow up CT,.  IR to continue to follow.    LOS: 5 days    Akyla Vavrek D 06/07/2011

## 2011-06-07 NOTE — Progress Notes (Signed)
TRIED TO RESTART IV UNABLE TO RESTART,  NOTIFIED IV TEAM.

## 2011-06-08 ENCOUNTER — Other Ambulatory Visit (HOSPITAL_COMMUNITY): Payer: Medicare Other

## 2011-06-08 DIAGNOSIS — E871 Hypo-osmolality and hyponatremia: Secondary | ICD-10-CM

## 2011-06-08 DIAGNOSIS — I359 Nonrheumatic aortic valve disorder, unspecified: Secondary | ICD-10-CM

## 2011-06-08 DIAGNOSIS — I10 Essential (primary) hypertension: Secondary | ICD-10-CM

## 2011-06-08 DIAGNOSIS — N3080 Other cystitis without hematuria: Secondary | ICD-10-CM

## 2011-06-08 LAB — CBC
Platelets: 360 10*3/uL (ref 150–400)
RBC: 3.3 MIL/uL — ABNORMAL LOW (ref 4.22–5.81)
WBC: 6.1 10*3/uL (ref 4.0–10.5)

## 2011-06-08 LAB — BASIC METABOLIC PANEL
CO2: 27 mEq/L (ref 19–32)
Chloride: 99 mEq/L (ref 96–112)
Sodium: 133 mEq/L — ABNORMAL LOW (ref 135–145)

## 2011-06-08 NOTE — Progress Notes (Signed)
  Subjective: Wants a hamburger with everything on it.  Objective: Vital signs in last 24 hours: Temp:  [97.4 F (36.3 C)-98 F (36.7 C)] 97.5 F (36.4 C) (05/21 0442) Pulse Rate:  [51-71] 71  (05/21 0442) Resp:  [18-19] 19  (05/21 0442) BP: (132-149)/(70-72) 132/70 mmHg (05/21 0442) SpO2:  [97 %-100 %] 100 % (05/21 0442) Last BM Date: 06/08/11  20 ml thru drain yesterday, +BM today, VSS  Intake/Output from previous day: 05/20 0701 - 05/21 0700 In: 2975 [P.O.:960; IV Piggyback:200] Out: 96045 [Urine:12600; Drains:20] Intake/Output this shift: Total I/O In: 360 [P.O.:360] Out: 601 [Urine:600; Stool:1]  General appearance: alert, cooperative and no distress GI: soft, non-tender; bowel sounds normal; no masses,  no organomegaly and drain still serous, slghtly cloudy  Lab Results:   Basename 06/08/11 0451 06/07/11 0920  WBC 6.1 5.5  HGB 9.2* 8.8*  HCT 28.5* 27.1*  PLT 360 361    BMET  Basename 06/08/11 0451 06/06/11 0411  NA 133* 130*  K 3.7 3.3*  CL 99 97  CO2 27 25  GLUCOSE 105* 134*  BUN 6 7  CREATININE 0.81 0.73  CALCIUM 8.6 8.0*   PT/INR No results found for this basename: LABPROT:2,INR:2 in the last 72 hours  No results found for this basename: AST:5,ALT:5,ALKPHOS:5,BILITOT:5,PROT:5,ALBUMIN:5 in the last 168 hours   Lipase  No results found for this basename: lipase     Studies/Results: No results found.  Medications:    . sodium chloride  500 mL Intravenous Once  . amiodarone  200 mg Oral BID  . atorvastatin  20 mg Oral QHS  . ciprofloxacin  400 mg Intravenous BID  . ferrous sulfate  325 mg Oral TID WC  . folic acid  1 mg Oral Daily  . irbesartan  75 mg Oral Daily  . metoprolol succinate  25 mg Oral QPM  . mulitivitamin with minerals  1 tablet Oral Daily  . pantoprazole  40 mg Oral Q1200  . sodium chloride  3 mL Intravenous Q12H  . thiamine  100 mg Oral Daily  . cyanocobalamin  100 mcg Oral Daily  . DISCONTD: ertapenem  1 g  Intravenous Q24H  . DISCONTD: metoprolol succinate  25 mg Oral QPM    Assessment/Plan s/p right transgluteal /pelvic abscess drainage 5/16; per Dr. Rosezena Sensor last note, CT planned for 5/22- consider injecting drain at time of scan ; skin protective barrier ointment for coccygeal area vs wound care assessment   Plan:  Repeat Ct tomorrow     LOS: 6 days    [name] 06/08/2011

## 2011-06-08 NOTE — Progress Notes (Signed)
Agree.  CT wed.

## 2011-06-08 NOTE — Progress Notes (Signed)
Subjective: Pt hungry; still with hematuria; c/o discomfort at skin overlying coccygeal region  Objective: Vital signs in last 24 hours: Temp:  [97.4 F (36.3 C)-98 F (36.7 C)] 97.5 F (36.4 C) (05/21 0442) Pulse Rate:  [51-71] 71  (05/21 0442) Resp:  [18-19] 19  (05/21 0442) BP: (132-149)/(70-72) 132/70 mmHg (05/21 0442) SpO2:  [97 %-100 %] 100 % (05/21 0442) Last BM Date: 06/08/11  Intake/Output from previous day: 05/20 0701 - 05/21 0700 In: 2975 [P.O.:960; IV Piggyback:200] Out: 16109 [Urine:12600; Drains:20] Intake/Output this shift: Total I/O In: 360 [P.O.:360] Out: 1 [Stool:1]  Right transgluteal drain intact, insertion site ok,NT, output about 20 cc's today, cx's- e.coli; erythematous area with mild skin breakdown at coccygeal region ? Radiation induced   Lab Results:   Basename 06/08/11 0451 06/07/11 0920  WBC 6.1 5.5  HGB 9.2* 8.8*  HCT 28.5* 27.1*  PLT 360 361   BMET  Basename 06/08/11 0451 06/06/11 0411  NA 133* 130*  K 3.7 3.3*  CL 99 97  CO2 27 25  GLUCOSE 105* 134*  BUN 6 7  CREATININE 0.81 0.73  CALCIUM 8.6 8.0*   PT/INR No results found for this basename: LABPROT:2,INR:2 in the last 72 hours ABG No results found for this basename: PHART:2,PCO2:2,PO2:2,HCO3:2 in the last 72 hours Results for orders placed during the hospital encounter of 06/02/11  CULTURE, ROUTINE-ABSCESS     Status: Normal   Collection Time   06/03/11 12:53 PM      Component Value Range Status Comment   Specimen Description ABSCESS PELVIC   Final    Special Requests Normal   Final    Gram Stain     Final    Value: FEW WBC PRESENT,BOTH PMN AND MONONUCLEAR     FEW SQUAMOUS EPITHELIAL CELLS PRESENT     NO ORGANISMS SEEN   Culture FEW ESCHERICHIA COLI   Final    Report Status 06/07/2011 FINAL   Final    Organism ID, Bacteria ESCHERICHIA COLI   Final     Studies/Results: No results found.  Anti-infectives: Anti-infectives     Start     Dose/Rate Route Frequency  Ordered Stop   06/07/11 1800   ciprofloxacin (CIPRO) IVPB 400 mg        400 mg 200 mL/hr over 60 Minutes Intravenous 2 times daily 06/07/11 1647     06/03/11 1800   ertapenem (INVANZ) 1 g in sodium chloride 0.9 % 50 mL IVPB  Status:  Discontinued        1 g 100 mL/hr over 30 Minutes Intravenous Every 24 hours 06/03/11 1737 06/07/11 1620   06/02/11 1945   ciprofloxacin (CIPRO) IVPB 400 mg  Status:  Discontinued        400 mg 200 mL/hr over 60 Minutes Intravenous Every 12 hours 06/02/11 1944 06/03/11 1737   06/02/11 1945   metroNIDAZOLE (FLAGYL) IVPB 500 mg  Status:  Discontinued        500 mg 100 mL/hr over 60 Minutes Intravenous Every 8 hours 06/02/11 1944 06/03/11 1737   06/02/11 1515   ciprofloxacin (CIPRO) IVPB 400 mg        400 mg 200 mL/hr over 60 Minutes Intravenous  Once 06/02/11 1514 06/02/11 1639   06/02/11 1515   metroNIDAZOLE (FLAGYL) IVPB 500 mg        500 mg 100 mL/hr over 60 Minutes Intravenous  Once 06/02/11 1514 06/02/11 1639          Assessment/Plan: s/p right transgluteal Willeen Cass  abscess drainage 5/16; per Dr. Rosezena Sensor last note, CT planned for 5/22- consider injecting drain at time of scan  ; skin protective barrier ointment for coccygeal area vs wound care assessment   LOS: 6 days    [name],D [facility] [address] 06/08/2011

## 2011-06-08 NOTE — Care Management Note (Signed)
    Page 1 of 2   06/11/2011     11:31:58 AM   CARE MANAGEMENT NOTE 06/11/2011  Patient:  [name], [name]   Account Number:  [id number]  Date Initiated:  06/08/2011  Documentation initiated by:  Lanier Clam  Subjective/Objective Assessment:   ADMITTED W/HEMATURIA.     Action/Plan:   FROM HOME W/SUPPORT.   Anticipated DC Date:  06/11/2011   Anticipated DC Plan:  HOME W HOME HEALTH SERVICES      DC Planning Services  CM consult      Choice offered to / List presented to:  C-1 Patient        HH arranged  HH-1 RN      [facility] agency  [facility]   Status of service:  Completed, signed off Medicare Important Message given?   (If response is "NO", the following Medicare IM given date fields will be blank) Date Medicare IM given:   Date Additional Medicare IM given:    Discharge Disposition:  HOME W HOME HEALTH SERVICES  Per UR Regulation:  Reviewed for med. necessity/level of care/duration of stay  If discussed at Long Length of Stay Meetings, dates discussed:    Comments:  06/10/11 Jamelle Noy RN,BSN NCM [phone] [facility] CHOSEN FOR HHRN-F/C CARE-EVAL F/C(HEMATURIA,CLOTS PRN);I/O.TRANSGLUTEAL DRAIN-EVAL DRAINAGE,& RECHARGE,RECORD VOLUME. PROVIDED PATIENT W/HHC LIST.MAY NEED HHRN-DRAIN CARE,?F/C CARE.ONCE HH AGENCY CHOSEN THEN I CAN BE MORE DETAILED W/SPECIFIC DOCUMENTATION NEEDED FOR HH RN ORDERS.  06/08/11 Shaletta Hinostroza RN,BSN NCM [phone] [facility] FOLLOWING.TRANSGLUTEAL DRAIN.

## 2011-06-08 NOTE — Progress Notes (Signed)
Interim history:    Pt is an 78 y/o with multiple history of hemorrhagic cystitis. He is s/p radiation therapy to his bladder from history of prostate cancer. Dr. Patsi Sears is his urologist and has been following the patient. Reportedly patient had some cauterization to his bladder which helped stop the bleeding until 3 weeks ago when the patient endorses hematuria. Despite seeing his urologist and getting his foley changed he has still had hematuria and reports that he is starting to feel weaker. Further evaluation by urologist as outpatient due to continued hematuria revealed 6.1x6.1 cm thick -walled collection/abscess in the pelvis with suspected communication arising from the anterior wall of the distal sigmoid/rectum.  Drain was placed and currently we are awaiting for drain to be pulled with further imaging to reassess abscess.  Urology continues carbapost infusion for continued hematuria.   Subjective: Patient is starting to feel frustrated.  States that he feels like his hematuria is not improving and is concerned about his diet.  States "I just want a burger." No acute issues reported otherwise.  Denies any fever, chills, nausea, abdominal discomfort. + hematuria  Objective: Filed Vitals:   06/07/11 1600 06/07/11 2056 06/08/11 0442 06/08/11 1307  BP:  139/72 132/70 133/71  Pulse: 53 51 71 60  Temp:  97.4 F (36.3 C) 97.5 F (36.4 C) 97.2 F (36.2 C)  TempSrc:  Oral Oral Oral  Resp:  18 19 16   Height:      Weight:      SpO2:  100% 100% 100%   Weight change:   Intake/Output Summary (Last 24 hours) at 06/08/11 1730 Last data filed at 06/08/11 1300  Gross per 24 hour  Intake   1170 ml  Output  40981 ml  Net -10196 ml   General: Alert, awake, oriented x3, in no acute distress.  HEENT: No bruits, no goiter.  Heart: Regular rate and rhythm, without murmurs, rubs, gallops.  Lungs: Clear to auscultation, bilateral air movement.  GU: Catheter in place. No blood noticed at  urethral meatus  Abdomen: Soft, nontender, nondistended, positive bowel sounds. Pelvic drain in place  Neuro: Grossly intact, nonfocal.  Back: Percutaneous drain inplace   Lab Results:  Basename 06/08/11 0451 06/06/11 0411  NA 133* 130*  K 3.7 3.3*  CL 99 97  CO2 27 25  GLUCOSE 105* 134*  BUN 6 7  CREATININE 0.81 0.73  CALCIUM 8.6 8.0*  MG -- --  PHOS -- --   No results found for this basename: AST:2,ALT:2,ALKPHOS:2,BILITOT:2,PROT:2,ALBUMIN:2 in the last 72 hours No results found for this basename: LIPASE:2,AMYLASE:2 in the last 72 hours  Basename 06/08/11 0451 06/07/11 0920  WBC 6.1 5.5  NEUTROABS -- --  HGB 9.2* 8.8*  HCT 28.5* 27.1*  MCV 86.4 86.6  PLT 360 361   No results found for this basename: CKTOTAL:3,CKMB:3,CKMBINDEX:3,TROPONINI:3 in the last 72 hours No components found with this basename: POCBNP:3 No results found for this basename: DDIMER:2 in the last 72 hours No results found for this basename: HGBA1C:2 in the last 72 hours No results found for this basename: CHOL:2,HDL:2,LDLCALC:2,TRIG:2,CHOLHDL:2,LDLDIRECT:2 in the last 72 hours No results found for this basename: TSH,T4TOTAL,FREET3,T3FREE,THYROIDAB in the last 72 hours No results found for this basename: VITAMINB12:2,FOLATE:2,FERRITIN:2,TIBC:2,IRON:2,RETICCTPCT:2 in the last 72 hours  Micro Results: Recent Results (from the past 240 hour(s))  CULTURE, ROUTINE-ABSCESS     Status: Normal   Collection Time   06/03/11 12:53 PM      Component Value Range Status Comment   Specimen  Description ABSCESS PELVIC   Final    Special Requests Normal   Final    Gram Stain     Final    Value: FEW WBC PRESENT,BOTH PMN AND MONONUCLEAR     FEW SQUAMOUS EPITHELIAL CELLS PRESENT     NO ORGANISMS SEEN   Culture FEW ESCHERICHIA COLI   Final    Report Status 06/07/2011 FINAL   Final    Organism ID, Bacteria ESCHERICHIA COLI   Final     Studies/Results: No results found.  Medications: I have reviewed the patient's  current medications.  Acute blood loss anemia (04/26/2011) -Secondary to hematuria that has been an on going problem for the patient after localized radiation therapy for his prostate cancer. Urology on board for hematuria.   Patient's h/h dropped to 6.7 and he was subsequently transfused 2 units post hemoglovin 8.5. Hemoglobin improved today 9.2 today and is maintaining steady.   With continued blood loss may have to transfuse again. Continue to monitor CBC's.   Atrial fibrillation (04/28/2011) -Stable will plan on continuing current regimen. Patient has been bradycardic today and as such will have to hold B blocker.   Intra-abdominal abscess (06/02/2011) -Cultures show E. Coli with sensitivities. Transitioned to cipro yesterday 5/21 (today is day 7 of antibiotics) as organism is succeptible and we can easily transition to an oral regimen once patient is ready for discharge.   ADENOCARCINOMA, PROSTATE (08/06/2008) Per Urology.   HYPERTENSION (11/09/2006) Has been stable. Will plan on continuing current regimen.   CORONARY ARTERY DISEASE (11/09/2006) We will continue to hold patient's aspirin while he has active  hematuria.   Will plan on discharging off of aspirin given his recurrent episodes of hematuria and have cardiology consider adding medication as outpatient.   S/P AVR (aortic valve replacement) (09/23/2010) Should the patient require surgery will have cardiology clear him. Otherwise will continue to observe as patient is stable currently.   Hemorrhagic cystitis (04/28/2011)  Urology is managing and has started patient on 3 way continuous irrigation with carboprost protocol.   Hyponatremia:  Initially as low as 128 but is improving and today is 133.  Suspect this was secondary to poor oral solute intake. Currently is improving. Will continue to monitor. Order BMP for tomorrow.   Disposition: Currently awaiting for disposition from surgical standpoint given his recent abscess.  Will have a better idea once drain is removed and repeat CT of abdomen and pelvis is obtained.   Hypocalcemia: Mild and patient asymptomatic and likely related to recent history of transfusion. Will monitor for now should calcium level drop below 7.5 or symptoms develop will plan on replacing.   Hypokalemia: Oral replace yesterday.  Currently condition resovled. Likely related to decreased oral intake. Should improve once patient is able to get his diet advanced.      LOS: 6 days   Penny Pia M.D.  [facility] 06/08/2011, 5:30 PM

## 2011-06-09 ENCOUNTER — Inpatient Hospital Stay (HOSPITAL_COMMUNITY): Payer: Medicare Other

## 2011-06-09 DIAGNOSIS — I4891 Unspecified atrial fibrillation: Secondary | ICD-10-CM

## 2011-06-09 LAB — CBC
HCT: 31.3 % — ABNORMAL LOW (ref 39.0–52.0)
MCV: 87.4 fL (ref 78.0–100.0)
Platelets: 424 10*3/uL — ABNORMAL HIGH (ref 150–400)
RBC: 3.58 MIL/uL — ABNORMAL LOW (ref 4.22–5.81)
WBC: 9 10*3/uL (ref 4.0–10.5)

## 2011-06-09 LAB — BASIC METABOLIC PANEL
CO2: 23 mEq/L (ref 19–32)
Calcium: 8.9 mg/dL (ref 8.4–10.5)
Chloride: 101 mEq/L (ref 96–112)
Sodium: 133 mEq/L — ABNORMAL LOW (ref 135–145)

## 2011-06-09 MED ORDER — IOHEXOL 300 MG/ML  SOLN
100.0000 mL | Freq: Once | INTRAMUSCULAR | Status: AC | PRN
  Administered 2011-06-09: 100 mL via INTRAVENOUS

## 2011-06-09 NOTE — Progress Notes (Signed)
CT today.  May need to go home with the drain.

## 2011-06-09 NOTE — Progress Notes (Signed)
Pt noted with occasional  confusion and being forgetful at times.  Viewed pt ambulating to bathroom and forgetting  About the IV pole with CBI and the foley drainage bag. Pt also tried to disconnect the foley drainage bag. Doctor Tannebaum in to see pt.  And encouraged pt to contact nurses before ambulating. I will follow up with dayshift RN to frequently round on patient for safety reasons.

## 2011-06-09 NOTE — Progress Notes (Signed)
  Subjective: Wants to go home  Objective: Vital signs in last 24 hours: Temp:  [97.2 F (36.2 C)-97.6 F (36.4 C)] 97.6 F (36.4 C) (05/22 0453) Pulse Rate:  [56-70] 70  (05/22 1016) Resp:  [18] 18  (05/22 0453) BP: (109-144)/(61-78) 144/78 mmHg (05/22 1016) SpO2:  [96 %-98 %] 96 % (05/22 0453) Last BM Date: 06/08/11  Intake/Output from previous day: 05/21 0701 - 05/22 0700 In: 610 [P.O.:600] Out: 2506 [Urine:2500; Drains:5; Stool:1] Intake/Output this shift: Total I/O In: 200 [IV Piggyback:200] Out: 1200 [Urine:1200]  General appearance: alert, cooperative and no distress GI: soft, non-tender; bowel sounds normal; no masses,  no organomegaly  Lab Results:   [facility] 06/09/11 0448 06/08/11 0451  WBC 9.0 6.1  HGB 10.1* 9.2*  HCT 31.3* 28.5*  PLT 424* 360    BMET  Basename 06/09/11 0420 06/08/11 0451  NA 133* 133*  K 3.9 3.7  CL 101 99  CO2 23 27  GLUCOSE 96 105*  BUN 5* 6  CREATININE 0.80 0.81  CALCIUM 8.9 8.6   PT/INR No results found for this basename: LABPROT:2,INR:2 in the last 72 hours  No results found for this basename: AST:5,ALT:5,ALKPHOS:5,BILITOT:5,PROT:5,ALBUMIN:5 in the last 168 hours   Lipase  No results found for this basename: lipase     Studies/Results: No results found.  Medications:    . sodium chloride  500 mL Intravenous Once  . amiodarone  200 mg Oral BID  . atorvastatin  20 mg Oral QHS  . ciprofloxacin  400 mg Intravenous BID  . ferrous sulfate  325 mg Oral TID WC  . folic acid  1 mg Oral Daily  . irbesartan  75 mg Oral Daily  . metoprolol succinate  25 mg Oral QPM  . mulitivitamin with minerals  1 tablet Oral Daily  . pantoprazole  40 mg Oral Q1200  . sodium chloride  3 mL Intravenous Q12H  . thiamine  100 mg Oral Daily  . cyanocobalamin  100 mcg Oral Daily    Assessment/Plan s/p right transgluteal /pelvic abscess drainage 5/16; per Dr. Rosezena Sensor last note, CT planned for 5/22- consider injecting drain at time of  scan ; skin protective barrier ointment for coccygeal area vs wound care assessment   Plan: CT pending  LOS: 7 days    Javon Snee 06/09/2011

## 2011-06-09 NOTE — Progress Notes (Signed)
Brief HPI Pt is an 78 y/o with multiple history of hemorrhagic cystitis. He is s/p radiation therapy to his bladder from history of prostate cancer. Dr. Patsi Sears is his urologist and has been following the patient. Reportedly patient had some cauterization to his bladder which helped stop the bleeding until 3 weeks prior to admission when the patient endorses hematuria. Despite seeing his urologist and getting his foley changed he has still had hematuria and reports that he is starting to feel weaker. Further evaluation by urologist as outpatient due to continued hematuria revealed 6.1x6.1 cm thick -walled collection/abscess in the pelvis with suspected communication arising from the anterior wall of the distal sigmoid/rectum. Drain was placed and currently we are awaiting for drain to be pulled with further imaging to reassess abscess. Urology continues carbapost infusion for continued hematuria.  Subjective: Sitting up in chair watching TV. Denies pain/discomfort. Verbalizes frustration at length of stay.   Objective: Vital signs Filed Vitals:   06/08/11 0442 06/08/11 1307 06/08/11 2009 06/09/11 0453  BP: 132/70 133/71 143/74 109/61  Pulse: 71 60 64 56  Temp: 97.5 F (36.4 C) 97.2 F (36.2 C) 97.2 F (36.2 C) 97.6 F (36.4 C)  TempSrc: Oral Oral Oral Oral  Resp: 19 16 18 18   Height:      Weight:      SpO2: 100% 100% 98% 96%   Weight change:  Last BM Date: 06/08/11  Intake/Output from previous day: 05/21 0701 - 05/22 0700 In: 610 [P.O.:600] Out: 2506 [Urine:2500; Drains:5; Stool:1]     Physical Exam: General: Alert, awake, oriented x2, in no acute distress. Somewhat thin/frail appearing.  HEENT: No bruits, no goiter. Mucus membranes mouth moist/pink. PERRL Heart: Regular rate and rhythm, without murmurs, rubs, gallops. Lungs:Normal effort. Breath sounds clear to auscultation bilaterally. No wheeze Abdomen: Soft, nontender, nondistended, positive bowel sounds. Foley in  place draining pink urine. Pelvic drain intact.  Extremities: No clubbing cyanosis or edema with positive pedal pulses. Neuro: Grossly intact, nonfocal. Cranial nerve II-XII intact.     Lab Results: Basic Metabolic Panel:  Basename 06/09/11 0420 06/08/11 0451  NA 133* 133*  K 3.9 3.7  CL 101 99  CO2 23 27  GLUCOSE 96 105*  BUN 5* 6  CREATININE 0.80 0.81  CALCIUM 8.9 8.6  MG -- --  PHOS -- --   CBC:  Basename 06/08/11 0451 06/07/11 0920  WBC 6.1 5.5  NEUTROABS -- --  HGB 9.2* 8.8*  HCT 28.5* 27.1*  MCV 86.4 86.6  PLT 360 361   CBG:  Basename 06/07/11 2121  GLUCAP 98   Misc. Labs:  Recent Results (from the past 240 hour(s))  CULTURE, ROUTINE-ABSCESS     Status: Normal   Collection Time   06/03/11 12:53 PM      Component Value Range Status Comment   Specimen Description ABSCESS PELVIC   Final    Special Requests Normal   Final    Gram Stain     Final    Value: FEW WBC PRESENT,BOTH PMN AND MONONUCLEAR     FEW SQUAMOUS EPITHELIAL CELLS PRESENT     NO ORGANISMS SEEN   Culture FEW ESCHERICHIA COLI   Final    Report Status 06/07/2011 FINAL   Final    Organism ID, Bacteria ESCHERICHIA COLI   Final     Studies/Results: No results found.  Medications: Scheduled Meds:   . sodium chloride  500 mL Intravenous Once  . amiodarone  200 mg Oral BID  .  atorvastatin  20 mg Oral QHS  . ciprofloxacin  400 mg Intravenous BID  . ferrous sulfate  325 mg Oral TID WC  . folic acid  1 mg Oral Daily  . irbesartan  75 mg Oral Daily  . metoprolol succinate  25 mg Oral QPM  . mulitivitamin with minerals  1 tablet Oral Daily  . pantoprazole  40 mg Oral Q1200  . sodium chloride  3 mL Intravenous Q12H  . thiamine  100 mg Oral Daily  . cyanocobalamin  100 mcg Oral Daily   Continuous Infusions:   . sodium chloride irrigation 1,400 mL (06/04/11 1723)   PRN Meds:.sodium chloride, HYDROcodone-acetaminophen, morphine injection, sodium chloride  Assessment/Plan:  Principal  Problem:  *Acute blood loss anemia Active Problems:  ADENOCARCINOMA, PROSTATE  HYPERTENSION  CORONARY ARTERY DISEASE  S/P AVR (aortic valve replacement)  Hemorrhagic cystitis  Atrial fibrillation  Intra-abdominal abscess  Hypocalcemia  Hyponatremia   Acute blood loss anemia  -Secondary to hematuria. Hgb stable. Has been transfused. Patient's h/h dropped to 6.7 and he was subsequently transfused 2 units post hemoglobin 8.5.   Hemorrhagic cystitis This has been an on going problem for the patient after localized radiation therapy for his prostate cancer. Urology is managing and has started patient on 3 way continuous irrigation with carboprost protocol.   Atrial fibrillation  - In SR now. Patient had been bradycardic earlier this hospitalization and so BB on hold. HR range 51-71. Will continue to hold.  Intra-abdominal abscess  -Cultures show E. Coli with sensitivities. Transitioned to cipro 5/21 (which was 7th day of antibiotics) as organism is succeptible and we can easily transition to an oral regimen once patient is ready for discharge. WC pending. Afebrile non-toxic appearing. Surgery following and repeat CT scheduled for today. Drains intact.   ADENOCARCINOMA, PROSTATE  Per Urology.   HYPERTENSION  Fair control. SBP range 109-144. Will continue current meds and monitor.    CORONARY ARTERY DISEASE We will continue to hold patient's aspirin while he has active hematuria. Will consider discontinuing at discharge given his recurrent episodes of hematuria and have cardiology consider adding medication as outpatient.   S/P AVR (aortic valve replacement) Should the patient require surgery will have cardiology clear him. Otherwise will continue to observe as patient is stable currently.   Hyponatremia:  Initially as low as 128 but seems to be stable at 133. Suspect this was secondary to poor oral solute intake. Will continue to monitor.    Hypocalcemia Mild and patient  asymptomatic and likely related to recent history of transfusion. Resolved. Monitor  Hypokalemia Currently condition resovled. Likely related to decreased oral intake. Should improve once patient is able to get his diet advanced. Will monitor closely.  Disposition: Currently awaiting for disposition from surgical standpoint given his recent abscess. Will have a better idea once drain is removed and repeat CT of abdomen and pelvis is obtained.   LOS: 7 days   [facility] M 06/09/2011, 7:53 AM  Patient seen and examined. Agree with above note with changes. Discussed with patient. Await CT report and surgery recommendations.  [name] 1:13 PM

## 2011-06-09 NOTE — Progress Notes (Signed)
PT. REFUSED TO HAVE JP DRESSING CHANGED TODAY, WILL CONTINUE TO MONITOR AND OBSERVE PT.

## 2011-06-09 NOTE — Progress Notes (Signed)
Subjective: Patient reports feeling much better. Hungry. Nurse reports slight sundowning with hx ETOHism. Urine clearing with few clots. Trans-gluteal drain with minimal drainage. 5cc irrigation returned. No pain.  Objective: Vital signs in last 24 hours: Temp:  [97.2 F (36.2 C)-97.6 F (36.4 C)] 97.6 F (36.4 C) (05/22 0453) Pulse Rate:  [56-64] 56  (05/22 0453) Resp:  [16-18] 18  (05/22 0453) BP: (109-143)/(61-74) 109/61 mmHg (05/22 0453) SpO2:  [96 %-100 %] 96 % (05/22 0453)A  Intake/Output from previous day: 05/21 0701 - 05/22 0700 In: 610 [P.O.:600] Out: 2506 [Urine:2500; Drains:5; Stool:1] Intake/Output this shift: Total [name] In: 5 [Other:5] Out: 1155 [Urine:1150; Drains:5]  Past Medical History  Diagnosis Date  . ADENOCARCINOMA, PROSTATE   . ALLERGIC RHINITIS   . B12 DEFICIENCY   . BENIGN PROSTATIC HYPERTROPHY   . CAROTID ARTERY DISEASE   . CEREBROVASCULAR ACCIDENT, HX OF   . CORONARY ARTERY DISEASE   . DIVERTICULOSIS, COLON   . GERD   . HEMORRHOIDS   . HIATAL HERNIA   . HYPERLIPIDEMIA   . HYPERTENSION   . OSTEOPENIA   . PEPTIC ULCER DISEASE   . PERIPHERAL VASCULAR DISEASE   . Personal history of alcoholism   . PROSTATE CANCER, HX OF   . PUD, HX OF   . TRANSIENT ISCHEMIC ATTACK   . Shingles   . Anemia   . AS (aortic stenosis)   . S/P AVR (aortic valve replacement) 09/23/2010  . Complication of anesthesia 09/23/2010    very confused after waking up. Stopped drinking 40 days before this surgery.  . Transfusion reaction, chill fever type 04/28/2011    Fever, rigors, tachycardia,tachypnea but no evidence of hemolysis  . Hemorrhagic cystitis 04/28/2011    Due to previous radiation for PSA recurrence of prostate cancer    Physical Exam:  General:thin wm in NAD Lungs - Normal respiratory effort, chest expands symmetrically.  Abdomen - Soft, non-tender & non-distended. Foley: urine clearing with few small old clots.  Lab Results:  Basename 06/08/11 0451  06/07/11 0920  WBC 6.1 5.5  HGB 9.2* 8.8*  HCT 28.5* 27.1*   BMET  Basename 06/09/11 0420 06/08/11 0451  NA 133* 133*  K 3.9 3.7  CL 101 99  CO2 23 27  GLUCOSE 96 105*  BUN 5* 6  CREATININE 0.80 0.81  CALCIUM 8.9 8.6   No results found for this basename: LABURIN:1 in the last 72 hours Results for orders placed during the hospital encounter of 06/02/11  CULTURE, ROUTINE-ABSCESS     Status: Normal   Collection Time   06/03/11 12:53 PM      Component Value Range Status Comment   Specimen Description ABSCESS PELVIC   Final    Special Requests Normal   Final    Gram Stain     Final    Value: FEW WBC PRESENT,BOTH PMN AND MONONUCLEAR     FEW SQUAMOUS EPITHELIAL CELLS PRESENT     NO ORGANISMS SEEN   Culture FEW ESCHERICHIA COLI   Final    Report Status 06/07/2011 FINAL   Final    Organism ID, Bacteria ESCHERICHIA COLI   Final     Studies/Results: @RISRSLT24 @  Assessment/Plan: 1. 1. 6cm pericolonic abscess drainage, E. Coli, now doing well. CT this AM. Discussed and reassured. 2. Hemorrhagic cystitis, stabilizing, but still on CBI and carboprost protocol.  3. Discussed frustration with patient: explained that he has returned from " 90 % gone to 90% well, with normalization of his labs. He  must stay on liquids to rest his bowel for now, but that is only temporary. Hematuria is clearing, and, if  Necessary, we will proceed to hyperbaric chamber as outpatient.   [name] 06/09/2011, 6:41 AM

## 2011-06-10 DIAGNOSIS — A0472 Enterocolitis due to Clostridium difficile, not specified as recurrent: Secondary | ICD-10-CM

## 2011-06-10 DIAGNOSIS — E871 Hypo-osmolality and hyponatremia: Secondary | ICD-10-CM

## 2011-06-10 DIAGNOSIS — N3080 Other cystitis without hematuria: Secondary | ICD-10-CM

## 2011-06-10 DIAGNOSIS — I4891 Unspecified atrial fibrillation: Secondary | ICD-10-CM

## 2011-06-10 LAB — BASIC METABOLIC PANEL
CO2: 24 mEq/L (ref 19–32)
Calcium: 9 mg/dL (ref 8.4–10.5)
Chloride: 97 mEq/L (ref 96–112)
Glucose, Bld: 94 mg/dL (ref 70–99)
Potassium: 4.2 mEq/L (ref 3.5–5.1)
Sodium: 130 mEq/L — ABNORMAL LOW (ref 135–145)

## 2011-06-10 LAB — CBC
Hemoglobin: 9.6 g/dL — ABNORMAL LOW (ref 13.0–17.0)
MCH: 28.1 pg (ref 26.0–34.0)
RBC: 3.42 MIL/uL — ABNORMAL LOW (ref 4.22–5.81)
WBC: 7.3 10*3/uL (ref 4.0–10.5)

## 2011-06-10 MED ORDER — METRONIDAZOLE 500 MG PO TABS
500.0000 mg | ORAL_TABLET | Freq: Three times a day (TID) | ORAL | Status: DC
  Administered 2011-06-10 – 2011-06-11 (×3): 500 mg via ORAL
  Filled 2011-06-10 (×7): qty 1

## 2011-06-10 MED ORDER — CIPROFLOXACIN HCL 500 MG PO TABS
500.0000 mg | ORAL_TABLET | Freq: Two times a day (BID) | ORAL | Status: DC
  Administered 2011-06-10 – 2011-06-11 (×2): 500 mg via ORAL
  Filled 2011-06-10 (×5): qty 1

## 2011-06-10 MED ORDER — ENSURE COMPLETE PO LIQD
237.0000 mL | Freq: Three times a day (TID) | ORAL | Status: DC
  Administered 2011-06-10 – 2011-06-11 (×3): 237 mL via ORAL

## 2011-06-10 MED ORDER — SACCHAROMYCES BOULARDII 250 MG PO CAPS
250.0000 mg | ORAL_CAPSULE | Freq: Two times a day (BID) | ORAL | Status: DC
  Administered 2011-06-10 – 2011-06-11 (×3): 250 mg via ORAL
  Filled 2011-06-10 (×4): qty 1

## 2011-06-10 NOTE — Progress Notes (Signed)
Subjective: Pt eager to go home; eating solids now; no new c/o; has had few loose "black" stools, c diff + now  Objective: Vital signs in last 24 hours: Temp:  [97.5 F (36.4 C)-98.1 F (36.7 C)] 98 F (36.7 C) (05/23 1300) Pulse Rate:  [58-65] 64  (05/23 1300) Resp:  [15-18] 15  (05/23 1300) BP: (115-152)/(56-74) 123/68 mmHg (05/23 1300) SpO2:  [96 %-99 %] 98 % (05/23 1300) Last BM Date: 06/09/11  Intake/Output from previous day: 05/22 0701 - 05/23 0700 In: 410 [IV Piggyback:400] Out: 2518 [Urine:2500; Drains:18] Intake/Output this shift: Total I/O In: 240 [P.O.:240] Out: 1500 [Urine:1500]  Rt TG drain intact, output about 10-15 cc's today, insertion site ok, mildly tender  Lab Results:   [facility] 06/10/11 0440 06/09/11 0448  WBC 7.3 9.0  HGB 9.6* 10.1*  HCT 29.3* 31.3*  PLT 409* 424*   BMET  Basename 06/10/11 0440 06/09/11 0420  NA 130* 133*  K 4.2 3.9  CL 97 101  CO2 24 23  GLUCOSE 94 96  BUN 5* 5*  CREATININE 0.89 0.80  CALCIUM 9.0 8.9   PT/INR No results found for this basename: LABPROT:2,INR:2 in the last 72 hours ABG No results found for this basename: PHART:2,PCO2:2,PO2:2,HCO3:2 in the last 72 hours Results for orders placed during the hospital encounter of 06/02/11  CULTURE, ROUTINE-ABSCESS     Status: Normal   Collection Time   06/03/11 12:53 PM      Component Value Range Status Comment   Specimen Description ABSCESS PELVIC   Final    Special Requests Normal   Final    Gram Stain     Final    Value: FEW WBC PRESENT,BOTH PMN AND MONONUCLEAR     FEW SQUAMOUS EPITHELIAL CELLS PRESENT     NO ORGANISMS SEEN   Culture FEW ESCHERICHIA COLI   Final    Report Status 06/07/2011 FINAL   Final    Organism ID, Bacteria ESCHERICHIA COLI   Final   CLOSTRIDIUM DIFFICILE BY PCR     Status: Abnormal   Collection Time   06/09/11  5:55 PM      Component Value Range Status Comment   C difficile by pcr POSITIVE (*) NEGATIVE  Final     Studies/Results: Ct  Abdomen Pelvis W Contrast  06/09/2011  *RADIOLOGY REPORT*  Clinical Data: Intra-abdominal abscess.  Gross hematuria.  Recent pelvic abscess drainage.  CT ABDOMEN AND PELVIS WITH CONTRAST  Technique:  Multidetector CT imaging of the abdomen and pelvis was performed following the standard protocol during bolus administration of intravenous contrast.  Contrast: OMNIPAQUE IOHEXOL 300 MG/ML  SOLN  Comparison: CT scan from recent pelvic abscess drainage.  Findings: The lung bases are clear.  No pleural effusion.  A calcified granuloma is noted at the right lung base.  The liver is unremarkable.  Tiny low attenuation lesion in the left hepatic lobe as likely a benign cyst.  The gallbladder is mildly contracted.  No common bile duct dilatation to the pancreas is unremarkable.  The spleen is normal in size.  No focal lesions. The adrenal glands and kidneys are unremarkable.  The stomach is not well descend with contrast.  No gross abnormalities are seen.  The duodenum demonstrates a large diverticulum.  The small bowel is unremarkable.  There is diffuse colonic wall thickening highly suggestive of C difficile colitis.  The pelvic drainage catheter is in good position.  There is only a small amount of residual fluid along the left  aspect of the catheter.  This measures approximately 2 cm in its greatest diameter.  The more superiorly there is a 3.5 cm abscess in between small bowel loops at the mid upper sacral level.  There is a Foley catheter in the bladder.  Diffuse bladder wall thickening is noted.  IMPRESSION:  1.  Near complete drainage of the right-sided pelvic abscess. There is a small residual, 2 cm fluid collection noted on the left side of the catheter. 2.  Persistent 3.5 cm abscess higher in the pelvis near small bowel loops. 3.  Diffuse colonic wall thickening consistent with C difficile colitis.  Original Report Authenticated By: P. Loralie Champagne, M.D.    Anti-infectives: Anti-infectives     Start      Dose/Rate Route Frequency Ordered Stop   06/10/11 2000   ciprofloxacin (CIPRO) tablet 500 mg        500 mg Oral 2 times daily 06/10/11 1159     06/10/11 1400   metroNIDAZOLE (FLAGYL) tablet 500 mg        500 mg Oral 3 times per day 06/10/11 1006     06/07/11 1800   ciprofloxacin (CIPRO) IVPB 400 mg  Status:  Discontinued        400 mg 200 mL/hr over 60 Minutes Intravenous 2 times daily 06/07/11 1647 06/10/11 1159   06/03/11 1800   ertapenem (INVANZ) 1 g in sodium chloride 0.9 % 50 mL IVPB  Status:  Discontinued        1 g 100 mL/hr over 30 Minutes Intravenous Every 24 hours 06/03/11 1737 06/07/11 1620   06/02/11 1945   ciprofloxacin (CIPRO) IVPB 400 mg  Status:  Discontinued        400 mg 200 mL/hr over 60 Minutes Intravenous Every 12 hours 06/02/11 1944 06/03/11 1737   06/02/11 1945   metroNIDAZOLE (FLAGYL) IVPB 500 mg  Status:  Discontinued        500 mg 100 mL/hr over 60 Minutes Intravenous Every 8 hours 06/02/11 1944 06/03/11 1737   06/02/11 1515   ciprofloxacin (CIPRO) IVPB 400 mg        400 mg 200 mL/hr over 60 Minutes Intravenous  Once 06/02/11 1514 06/02/11 1639   06/02/11 1515   metroNIDAZOLE (FLAGYL) IVPB 500 mg        500 mg 100 mL/hr over 60 Minutes Intravenous  Once 06/02/11 1514 06/02/11 1639          Assessment/Plan: s/p pelvic abscess drainage 5/16; f/u CT reveals near complete resolution of drained abscess with exception of small 2 cm collection to left of drain as well as 3.5 cm collection higher in pelvis adj to small bowel loops; await CCS recommendations. Would consider leaving current drain in, treat c diff and obtaining f/u CT/drain injection in 1 week.   LOS: 8 days    Aimee Heldman,D [facility] 06/10/2011

## 2011-06-10 NOTE — Progress Notes (Signed)
Subjective: C Diff positive.Marland Kitchen He says he doesn't have much diarrhea, Only one stool recorded yesterday. He wants to eat and go home.  Says he's "tied down like a dog," referring to IV, and cystic irrigations   Objective: Vital signs in last 24 hours: Temp:  [97 F (36.1 C)-98.1 F (36.7 C)] 98.1 F (36.7 C) (05/23 0615) Pulse Rate:  [55-70] 58  (05/23 0615) Resp:  [18-20] 18  (05/23 0615) BP: (92-152)/(50-78) 126/56 mmHg (05/23 0615) SpO2:  [96 %-100 %] 96 % (05/23 0615) Last BM Date: 06/09/11  CT shows near complete resolution of the Right side pelvic abscess, with small 2 cm collection Left of catheter, 3.5 cm abscess near pelvis, small bowel loops, colonic wall thickening which looks like C Diff, C Diff PCR is positive  Intake/Output from previous day: 05/22 0701 - 05/23 0700 In: 410 [IV Piggyback:400] Out: 2518 [Urine:2500; Drains:18] Intake/Output this shift: Total I/O In: 240 [P.O.:240] Out: 700 [Urine:700]  General appearance: alert, cooperative and wants some change GI: soft, non-tender; bowel sounds normal; no masses,  no organomegaly  Lab Results:   [facility] 06/10/11 0440 06/09/11 0448  WBC 7.3 9.0  HGB 9.6* 10.1*  HCT 29.3* 31.3*  PLT 409* 424*    BMET  Basename 06/10/11 0440 06/09/11 0420  NA 130* 133*  K 4.2 3.9  CL 97 101  CO2 24 23  GLUCOSE 94 96  BUN 5* 5*  CREATININE 0.89 0.80  CALCIUM 9.0 8.9   PT/INR No results found for this basename: LABPROT:2,INR:2 in the last 72 hours  No results found for this basename: AST:5,ALT:5,ALKPHOS:5,BILITOT:5,PROT:5,ALBUMIN:5 in the last 168 hours   Lipase  No results found for this basename: lipase     Studies/Results: Ct Abdomen Pelvis W Contrast  06/09/2011  *RADIOLOGY REPORT*  Clinical Data: Intra-abdominal abscess.  Gross hematuria.  Recent pelvic abscess drainage.  CT ABDOMEN AND PELVIS WITH CONTRAST  Technique:  Multidetector CT imaging of the abdomen and pelvis was performed following the  standard protocol during bolus administration of intravenous contrast.  Contrast: OMNIPAQUE IOHEXOL 300 MG/ML  SOLN  Comparison: CT scan from recent pelvic abscess drainage.  Findings: The lung bases are clear.  No pleural effusion.  A calcified granuloma is noted at the right lung base.  The liver is unremarkable.  Tiny low attenuation lesion in the left hepatic lobe as likely a benign cyst.  The gallbladder is mildly contracted.  No common bile duct dilatation to the pancreas is unremarkable.  The spleen is normal in size.  No focal lesions. The adrenal glands and kidneys are unremarkable.  The stomach is not well descend with contrast.  No gross abnormalities are seen.  The duodenum demonstrates a large diverticulum.  The small bowel is unremarkable.  There is diffuse colonic wall thickening highly suggestive of C difficile colitis.  The pelvic drainage catheter is in good position.  There is only a small amount of residual fluid along the left aspect of the catheter.  This measures approximately 2 cm in its greatest diameter.  The more superiorly there is a 3.5 cm abscess in between small bowel loops at the mid upper sacral level.  There is a Foley catheter in the bladder.  Diffuse bladder wall thickening is noted.  IMPRESSION:  1.  Near complete drainage of the right-sided pelvic abscess. There is a small residual, 2 cm fluid collection noted on the left side of the catheter. 2.  Persistent 3.5 cm abscess higher in the pelvis  near small bowel loops. 3.  Diffuse colonic wall thickening consistent with C difficile colitis.  Original Report Authenticated By: P. Loralie Champagne, M.D.    Medications:    . sodium chloride  500 mL Intravenous Once  . amiodarone  200 mg Oral BID  . atorvastatin  20 mg Oral QHS  . ciprofloxacin  400 mg Intravenous BID  . ferrous sulfate  325 mg Oral TID WC  . folic acid  1 mg Oral Daily  . irbesartan  75 mg Oral Daily  . metoprolol succinate  25 mg Oral QPM  .  mulitivitamin with minerals  1 tablet Oral Daily  . pantoprazole  40 mg Oral Q1200  . sodium chloride  3 mL Intravenous Q12H  . thiamine  100 mg Oral Daily  . cyanocobalamin  100 mcg Oral Daily    Assessment/Plan 1. Intra-abdominal abscess by CT. No prior history of diverticulitis or diverticulosis. Purulent drainage from site after CT guided drain placement 06/03/11.New CT findings above. 2. History of prostate cancer, status post prostatectomy, radiation therapy, hemorrhagic cystitis, ongoing leakage around his foley.  3. Recent hospitalization with hypoxia and acute respiratory failure requiring ventilator support.  4. S/P aortic valve replacement, September 2012  5. Atrial fibrillation controlled with metoprolol and amiodarone.  6. Recent congestive heart failure  7. History of alcohol use and encephalopathy, recurrent DT'S  8. History of stroke with occluded left carotid, status post right carotid endarterectomy.  9.Hyperkalemia  10.Hyponatremia 11. C diff colitis   Plan: Discuss CT findings, and start protocol for C Diff.    LOS: 8 days    [name] 06/10/2011

## 2011-06-10 NOTE — Progress Notes (Signed)
Subjective: Patient reports "hungry and I want to go home".  S: 78 yo male:  1999 Radical prostatectomy for CaP                            2007 EBRT for CaP recurrence                             2013 Acute hemorrhagic late effect  radiation cystitis                               --incidental asymptomatic 6.5cm diverticular abscess-drained trans gluteally. C/s +.                              --  Now + C. Diff.                             -- follow-up CT yesterday: original abscess 6cm down to 2cm, but 2nd abscess 3.5cm, longitudinal along small bowel superior to-and separate from- drain found.  Objective: Vital signs in last 24 hours: Temp:  [97 F (36.1 C)-98.1 F (36.7 C)] 98.1 F (36.7 C) (05/23 0615) Pulse Rate:  [55-65] 58  (05/23 0615) Resp:  [18-20] 18  (05/23 0615) BP: (92-152)/(50-74) 126/56 mmHg (05/23 0615) SpO2:  [96 %-100 %] 96 % (05/23 0615)A  Intake/Output from previous day: 05/22 0701 - 05/23 0700 In: 410 [IV Piggyback:400] Out: 2518 [Urine:2500; Drains:18] Intake/Output this shift: Total I/O In: 240 [P.O.:240] Out: 700 [Urine:700]  Past Medical History  Diagnosis Date  . ADENOCARCINOMA, PROSTATE   . ALLERGIC RHINITIS   . B12 DEFICIENCY   . BENIGN PROSTATIC HYPERTROPHY   . CAROTID ARTERY DISEASE   . CEREBROVASCULAR ACCIDENT, HX OF   . CORONARY ARTERY DISEASE   . DIVERTICULOSIS, COLON   . GERD   . HEMORRHOIDS   . HIATAL HERNIA   . HYPERLIPIDEMIA   . HYPERTENSION   . OSTEOPENIA   . PEPTIC ULCER DISEASE   . PERIPHERAL VASCULAR DISEASE   . Personal history of alcoholism   . PROSTATE CANCER, HX OF   . PUD, HX OF   . TRANSIENT ISCHEMIC ATTACK   . Shingles   . Anemia   . AS (aortic stenosis)   . S/P AVR (aortic valve replacement) 09/23/2010  . Complication of anesthesia 09/23/2010    very confused after waking up. Stopped drinking 40 days before this surgery.  . Transfusion reaction, chill fever type 04/28/2011    Fever, rigors, tachycardia,tachypnea  but no evidence of hemolysis  . Hemorrhagic cystitis 04/28/2011    Due to previous radiation for PSA recurrence of prostate cancer    Physical Exam:  General:thin wm, demanding to be fed, and wants D/C.  Lungs - Normal respiratory effort, chest expands symmetrically.  Abdomen - Soft, non-tender & non-distended.  Lab Results:  Basename 06/10/11 0440 06/09/11 0448 06/08/11 0451  WBC 7.3 9.0 6.1  HGB 9.6* 10.1* 9.2*  HCT 29.3* 31.3* 28.5*   BMET  Basename 06/10/11 0440 06/09/11 0420  NA 130* 133*  K 4.2 3.9  CL 97 101  CO2 24 23  GLUCOSE 94 96  BUN 5* 5*  CREATININE 0.89 0.80  CALCIUM 9.0 8.9   No results found for this basename: LABURIN:1  in the last 72 hours Results for orders placed during the hospital encounter of 06/02/11  CULTURE, ROUTINE-ABSCESS     Status: Normal   Collection Time   06/03/11 12:53 PM      Component Value Range Status Comment   Specimen Description ABSCESS PELVIC   Final    Special Requests Normal   Final    Gram Stain     Final    Value: FEW WBC PRESENT,BOTH PMN AND MONONUCLEAR     FEW SQUAMOUS EPITHELIAL CELLS PRESENT     NO ORGANISMS SEEN   Culture FEW ESCHERICHIA COLI   Final    Report Status 06/07/2011 FINAL   Final    Organism ID, Bacteria ESCHERICHIA COLI   Final   CLOSTRIDIUM DIFFICILE BY PCR     Status: Abnormal   Collection Time   06/09/11  5:55 PM      Component Value Range Status Comment   C difficile by pcr POSITIVE (*) NEGATIVE  Final     Studies/Results: @RISRSLT24 @ Study Result     *RADIOLOGY REPORT*  Clinical Data: Intra-abdominal abscess. Gross hematuria. Recent  pelvic abscess drainage.  CT ABDOMEN AND PELVIS WITH CONTRAST  Technique: Multidetector CT imaging of the abdomen and pelvis was  performed following the standard protocol during bolus  administration of intravenous contrast.  Contrast: OMNIPAQUE IOHEXOL 300 MG/ML SOLN  Comparison: CT scan from recent pelvic abscess drainage.  Findings: The lung bases  are clear. No pleural effusion. A  calcified granuloma is noted at the right lung base.  The liver is unremarkable. Tiny low attenuation lesion in the left  hepatic lobe as likely a benign cyst. The gallbladder is mildly  contracted. No common bile duct dilatation to the pancreas is  unremarkable. The spleen is normal in size. No focal lesions.  The adrenal glands and kidneys are unremarkable.  The stomach is not well descend with contrast. No gross  abnormalities are seen. The duodenum demonstrates a large  diverticulum. The small bowel is unremarkable. There is diffuse  colonic wall thickening highly suggestive of C difficile colitis.  The pelvic drainage catheter is in good position. There is only a  small amount of residual fluid along the left aspect of the  catheter. This measures approximately 2 cm in its greatest  diameter. The more superiorly there is a 3.5 cm abscess in between  small bowel loops at the mid upper sacral level.  There is a Foley catheter in the bladder. Diffuse bladder wall  thickening is noted.  IMPRESSION:  1. Near complete drainage of the right-sided pelvic abscess.  There is a small residual, 2 cm fluid collection noted on the left  side of the catheter.  2. Persistent 3.5 cm abscess higher in the pelvis near small bowel  loops.  3. Diffuse colonic wall thickening consistent with C difficile  colitis.  Original Report Authenticated By: P. Loralie Champagne, M.D.    Assessment/Plan:  Pt has not been seen by General Surgery yet today. He is medically stable today, and wants to be fed and discharged. I have had a phone consultation with the pt and his wife, and will leave disposition to GS judgment: ? possible discharge with Home Health Nurse to evaluate foley ( ? Hematuria, and ? Clot, with prn irrigation of clots); evaluation of I/o; and drainage and recharge of his transgluteal drain, with recorded volume of drainage. D/c antibiotics per IM for coverage of C.  Dif, and coverage if heart valve, as  well as abscess. He could have repeat Ct pelvis in 1 week, with op follow-up with GS and with Urology.    Alternatively, GS may want to continue with in-house Rx, or pursue surgical option for surgical drainage of newly found abscess.    Will continue to follow.   [name] I 06/10/2011, 10:46 AM

## 2011-06-10 NOTE — Progress Notes (Signed)
CT shows resolution of abscess at drain.  New fluid collection above this is not amendable to perc drain.  C diff colitis.  Can go home with drain once c diff treated. He will need follow up CT later time.

## 2011-06-10 NOTE — Progress Notes (Signed)
Subjective: Alert and slightly irritable. Wants food and to be discharged.   Objective: Vital signs Filed Vitals:   06/09/11 1650 06/09/11 1656 06/09/11 2012 06/10/11 0615  BP: 152/74 135/70 115/65 126/56  Pulse: 65  60 58  Temp:   97.5 F (36.4 C) 98.1 F (36.7 C)  TempSrc:   Oral Oral  Resp:   18 18  Height:      Weight:      SpO2:   99% 96%   Weight change:  Last BM Date: 06/09/11  Intake/Output from previous day: 05/22 0701 - 05/23 0700 In: 410 [IV Piggyback:400] Out: 2518 [Urine:2500; Drains:18] Total I/O In: 240 [P.O.:240] Out: 1500 [Urine:1500]   Physical Exam: General: Alert, awake, oriented x3, in no acute distress. Thin/rail appearing.  HEENT: No bruits, no goiter. PERRL  Heart: Regular rate and rhythm, without murmurs, rubs, gallops. Lungs:  Normal effort. Breath sounds clear to auscultation bilaterally. No rhonchi , wheeze Abdomen: Soft, nontender, nondistended, positive bowel sounds. Foley draining pink urine. Pelvic drain intact Extremities: No clubbing cyanosis or edema with positive pedal pulses. Neuro: Grossly intact, nonfocal.     Lab Results: Basic Metabolic Panel:  Basename 06/10/11 0440 06/09/11 0420  NA 130* 133*  K 4.2 3.9  CL 97 101  CO2 24 23  GLUCOSE 94 96  BUN 5* 5*  CREATININE 0.89 0.80  CALCIUM 9.0 8.9  MG -- --  PHOS -- --   Liver Function Tests: No results found for this basename: AST:2,ALT:2,ALKPHOS:2,BILITOT:2,PROT:2,ALBUMIN:2 in the last 72 hours No results found for this basename: LIPASE:2,AMYLASE:2 in the last 72 hours No results found for this basename: AMMONIA:2 in the last 72 hours CBC:  Basename 06/10/11 0440 06/09/11 0448  WBC 7.3 9.0  NEUTROABS -- --  HGB 9.6* 10.1*  HCT 29.3* 31.3*  MCV 85.7 87.4  PLT 409* 424*   CBG:  Basename 06/07/11 2121  GLUCAP 98   Misc. Labs:  Recent Results (from the past 240 hour(s))  CULTURE, ROUTINE-ABSCESS     Status: Normal   Collection Time   06/03/11 12:53 PM   Component Value Range Status Comment   Specimen Description ABSCESS PELVIC   Final    Special Requests Normal   Final    Gram Stain     Final    Value: FEW WBC PRESENT,BOTH PMN AND MONONUCLEAR     FEW SQUAMOUS EPITHELIAL CELLS PRESENT     NO ORGANISMS SEEN   Culture FEW ESCHERICHIA COLI   Final    Report Status 06/07/2011 FINAL   Final    Organism ID, Bacteria ESCHERICHIA COLI   Final   CLOSTRIDIUM DIFFICILE BY PCR     Status: Abnormal   Collection Time   06/09/11  5:55 PM      Component Value Range Status Comment   C difficile by pcr POSITIVE (*) NEGATIVE  Final     Studies/Results: Ct Abdomen Pelvis W Contrast  06/09/2011  *RADIOLOGY REPORT*  Clinical Data: Intra-abdominal abscess.  Gross hematuria.  Recent pelvic abscess drainage.  CT ABDOMEN AND PELVIS WITH CONTRAST  Technique:  Multidetector CT imaging of the abdomen and pelvis was performed following the standard protocol during bolus administration of intravenous contrast.  Contrast: OMNIPAQUE IOHEXOL 300 MG/ML  SOLN  Comparison: CT scan from recent pelvic abscess drainage.  Findings: The lung bases are clear.  No pleural effusion.  A calcified granuloma is noted at the right lung base.  The liver is unremarkable.  Tiny low attenuation lesion  in the left hepatic lobe as likely a benign cyst.  The gallbladder is mildly contracted.  No common bile duct dilatation to the pancreas is unremarkable.  The spleen is normal in size.  No focal lesions. The adrenal glands and kidneys are unremarkable.  The stomach is not well descend with contrast.  No gross abnormalities are seen.  The duodenum demonstrates a large diverticulum.  The small bowel is unremarkable.  There is diffuse colonic wall thickening highly suggestive of C difficile colitis.  The pelvic drainage catheter is in good position.  There is only a small amount of residual fluid along the left aspect of the catheter.  This measures approximately 2 cm in its greatest diameter.  The  more superiorly there is a 3.5 cm abscess in between small bowel loops at the mid upper sacral level.  There is a Foley catheter in the bladder.  Diffuse bladder wall thickening is noted.  IMPRESSION:  1.  Near complete drainage of the right-sided pelvic abscess. There is a small residual, 2 cm fluid collection noted on the left side of the catheter. 2.  Persistent 3.5 cm abscess higher in the pelvis near small bowel loops. 3.  Diffuse colonic wall thickening consistent with C difficile colitis.  Original Report Authenticated By: P. Loralie Champagne, M.D.    Medications: Scheduled Meds:   . sodium chloride  500 mL Intravenous Once  . amiodarone  200 mg Oral BID  . atorvastatin  20 mg Oral QHS  . ciprofloxacin  400 mg Intravenous BID  . ferrous sulfate  325 mg Oral TID WC  . folic acid  1 mg Oral Daily  . irbesartan  75 mg Oral Daily  . metoprolol succinate  25 mg Oral QPM  . metroNIDAZOLE  500 mg Oral Q8H  . mulitivitamin with minerals  1 tablet Oral Daily  . pantoprazole  40 mg Oral Q1200  . sodium chloride  3 mL Intravenous Q12H  . thiamine  100 mg Oral Daily  . cyanocobalamin  100 mcg Oral Daily   Continuous Infusions:   . sodium chloride irrigation 1,400 mL (06/04/11 1723)   PRN Meds:.sodium chloride, HYDROcodone-acetaminophen, morphine injection, sodium chloride  Assessment/Plan:  Principal Problem:  *Acute blood loss anemia Active Problems:  ADENOCARCINOMA, PROSTATE  HYPERTENSION  CORONARY ARTERY DISEASE  S/P AVR (aortic valve replacement)  Hemorrhagic cystitis  Atrial fibrillation  Intra-abdominal abscess  Hypocalcemia  Hyponatremia   Acute blood loss anemia  -Secondary to hematuria. Hgb stable. Has been transfused. Patient's h/h dropped to 6.7 and he was subsequently transfused 2 units. Hg 9.6 today   Hemorrhagic cystitis  This has been an on going problem for the patient after localized radiation therapy for his prostate cancer. Urology is managing. Pt completed  carboprost protocol 06/06/11. Continues with foley with 3 way continuous irrigation. Appreciate Urology input appreciated.     Atrial fibrillation  - In SR now. Patient had been bradycardic earlier this hospitalization and so BB on hold. HR range 58-70. Will continue to hold.   Intra-abdominal abscess  -Cultures show E. Coli with sensitivities. Transitioned to cipro 5/21 (which was 7th day of antibiotics) as organism is succeptable. Day#9 of antibiotics. Afebrile non-toxic appearing. WC WNL. Repeat CT yields original abscess decreased in size and 2nd abscess Defer to surgery for plan.    C-Diff Positive PCR. Pt denies frequent loose stool. Flagyl. Contact isolation. Florastor.  ADENOCARCINOMA, PROSTATE  Per Urology.   HYPERTENSION  Fair control. SBP range 92-152 Will continue  current meds and monitor.   CORONARY ARTERY DISEASE We will continue to hold patient's aspirin while he has active hematuria. Will consider discontinuing at discharge given his recurrent episodes of hematuria and have cardiology consider adding medication as outpatient.   S/P AVR (aortic valve replacement) Stable. Should the patient require surgery will have cardiology clear him. Otherwise will continue to observe as patient is stable currently.   Hyponatremia:  Initially as low as 128 but seems to be stable at range 130-133. Suspect this was secondary to poor oral solute intake.  Diet advanced to low fiber. Will continue to monitor.   Hypocalcemia  Mild and patient asymptomatic and likely related to recent history of transfusion. Resolved. Monitor   Hypokalemia  Currently condition resovled. Likely related to decreased oral intake. Diet advanced to low fiber.  Will monitor closely.   Pedal edema Likely due to fluids and immobilization along with poor nutritional status. Ensure. Ambulate.  Disposition: Currently awaiting for disposition from surgical standpoint given his additional abscess. Urology recommending  discharge with foley, drains, home health and repeat CT 1 week. Await surgery recommendations given CT findings. Will request care management consult in anticipation of discharge to home hopefully 24 hours.      LOS: 8 days   [facility] M 06/10/2011, 11:40 AM  Patient seen and examined. Agree with above note. Await Surgery recommendations. Continue treatment for c-diff.  Mai Longnecker 12:47 PM

## 2011-06-10 NOTE — Progress Notes (Signed)
ANTIBIOTIC CONSULT NOTE - Follow Up  Pharmacy Consult for Cipro Indication: E.coli pelvic abscess  Allergies  Allergen Reactions  . Tolterodine Tartrate    Patient Measurements: Height: 5\' 11"  (180.3 cm) Weight: 157 lb 3 oz (71.3 kg) IBW/kg (Calculated) : 75.3   Vital Signs: Temp: 98.1 F (36.7 C) (05/23 0615) Temp src: Oral (05/23 0615) BP: 126/56 mmHg (05/23 0615) Pulse Rate: 58  (05/23 0615) Intake/Output from previous day: 05/22 0701 - 05/23 0700 In: 410 [IV Piggyback:400] Out: 2518 [Urine:2500; Drains:18] Intake/Output from this shift: Total I/O In: 240 [P.O.:240] Out: 1500 [Urine:1500]  Labs:  Basename 06/10/11 0440 06/09/11 0448 06/09/11 0420 06/08/11 0451  WBC 7.3 9.0 -- 6.1  HGB 9.6* 10.1* -- 9.2*  PLT 409* 424* -- 360  LABCREA -- -- -- --  CREATININE 0.89 -- 0.80 0.81   Estimated Creatinine Clearance: 63.4 ml/min (by C-G formula based on Cr of 0.89).   Assessment:  43 YOM with h/o prostate cancer with ongoing hematuria s/p localized radiation therapy.  Urology on board.  Patient with Intra-abdominal abscess (surgery on board). Unclear of source for pelvic abscess, possibly from diverticulitis process.  S/p transgluteal drain placement by IR 5/16.  Patient was on Ertapenem, and pelvic abscess culture grew E.coli.  MD d/c ertapenem and started Cipro 5/20 based on sensitivity panel.  Day #4 Cipro (#9 total antibiotics) Plan likely 10 days total.  Also Day #1 Flagyl for +C.diff  Amiodarone on board, along with cipro, can prolong QTc interval.  QTc = 470 ms on 5/23.   Goal of Therapy:   Appropriate renal adjustment of Ciprofloxacin  Plan:   Ciprofloxacin changed to 500mg  po BID  Will f/u duration of therapy and QTc interval.   Loralee Pacas, PharmD, BCPS Pager: [phone] 06/10/2011,12:38 PM

## 2011-06-11 DIAGNOSIS — N3080 Other cystitis without hematuria: Secondary | ICD-10-CM

## 2011-06-11 DIAGNOSIS — E871 Hypo-osmolality and hyponatremia: Secondary | ICD-10-CM

## 2011-06-11 DIAGNOSIS — A0472 Enterocolitis due to Clostridium difficile, not specified as recurrent: Secondary | ICD-10-CM

## 2011-06-11 DIAGNOSIS — I4891 Unspecified atrial fibrillation: Secondary | ICD-10-CM

## 2011-06-11 LAB — BASIC METABOLIC PANEL
BUN: 9 mg/dL (ref 6–23)
CO2: 24 mEq/L (ref 19–32)
Calcium: 8.3 mg/dL — ABNORMAL LOW (ref 8.4–10.5)
Chloride: 99 mEq/L (ref 96–112)
Creatinine, Ser: 1.06 mg/dL (ref 0.50–1.35)
Glucose, Bld: 97 mg/dL (ref 70–99)

## 2011-06-11 LAB — CBC
HCT: 28.1 % — ABNORMAL LOW (ref 39.0–52.0)
Hemoglobin: 9.1 g/dL — ABNORMAL LOW (ref 13.0–17.0)
MCH: 28 pg (ref 26.0–34.0)
MCV: 86.5 fL (ref 78.0–100.0)
RBC: 3.25 MIL/uL — ABNORMAL LOW (ref 4.22–5.81)

## 2011-06-11 LAB — MAGNESIUM: Magnesium: 1.8 mg/dL (ref 1.5–2.5)

## 2011-06-11 MED ORDER — ENSURE COMPLETE PO LIQD: 237.0000 mL | Freq: Three times a day (TID) | ORAL | Status: DC

## 2011-06-11 MED ORDER — SACCHAROMYCES BOULARDII 250 MG PO CAPS: 250.0000 mg | ORAL_CAPSULE | Freq: Two times a day (BID) | ORAL | Status: AC

## 2011-06-11 MED ORDER — CIPROFLOXACIN HCL 500 MG PO TABS: 500.0000 mg | ORAL_TABLET | Freq: Two times a day (BID) | ORAL | Status: AC

## 2011-06-11 MED ORDER — METRONIDAZOLE 500 MG PO TABS: 500.0000 mg | ORAL_TABLET | Freq: Three times a day (TID) | ORAL | Status: AC

## 2011-06-11 NOTE — Progress Notes (Signed)
Subjective: He is much happier after diet, doesn't feel bad, and wants to go home.l   Objective: Vital signs in last 24 hours: Temp:  [98 F (36.7 C)-98.7 F (37.1 C)] 98.7 F (37.1 C) (05/24 0540) Pulse Rate:  [64-68] 68  (05/24 0540) Resp:  [15-18] 18  (05/24 0540) BP: (117-135)/(57-68) 135/66 mmHg (05/24 0540) SpO2:  [98 %-99 %] 99 % (05/24 0540) Last BM Date: 06/09/11  Only one stool recorded, afebrile, VSS, labs OK  Intake/Output from previous day: 05/23 0701 - 05/24 0700 In: 265 [P.O.:240] Out: 1610 [RUEAV:4098; Drains:10] Intake/Output this shift:    General appearance: alert, cooperative and no distress GI: soft, non-tender; bowel sounds normal; no masses,  no organomegaly and He has never had any abdominal tenderness. Drain isn't bothering him much it's still a cloudy serous color.  Lab Results:   Basename 06/11/11 0435 06/10/11 0440  WBC 8.6 7.3  HGB 9.1* 9.6*  HCT 28.1* 29.3*  PLT 380 409*    BMET  Basename 06/11/11 0435 06/10/11 0440  NA 133* 130*  K 3.9 4.2  CL 99 97  CO2 24 24  GLUCOSE 97 94  BUN 9 5*  CREATININE 1.06 0.89  CALCIUM 8.3* 9.0   PT/INR No results found for this basename: LABPROT:2,INR:2 in the last 72 hours  No results found for this basename: AST:5,ALT:5,ALKPHOS:5,BILITOT:5,PROT:5,ALBUMIN:5 in the last 168 hours   Lipase  No results found for this basename: lipase     Studies/Results: Ct Abdomen Pelvis W Contrast  06/09/2011  *RADIOLOGY REPORT*  Clinical Data: Intra-abdominal abscess.  Gross hematuria.  Recent pelvic abscess drainage.  CT ABDOMEN AND PELVIS WITH CONTRAST  Technique:  Multidetector CT imaging of the abdomen and pelvis was performed following the standard protocol during bolus administration of intravenous contrast.  Contrast: OMNIPAQUE IOHEXOL 300 MG/ML  SOLN  Comparison: CT scan from recent pelvic abscess drainage.  Findings: The lung bases are clear.  No pleural effusion.  A calcified granuloma is  noted at the right lung base.  The liver is unremarkable.  Tiny low attenuation lesion in the left hepatic lobe as likely a benign cyst.  The gallbladder is mildly contracted.  No common bile duct dilatation to the pancreas is unremarkable.  The spleen is normal in size.  No focal lesions. The adrenal glands and kidneys are unremarkable.  The stomach is not well descend with contrast.  No gross abnormalities are seen.  The duodenum demonstrates a large diverticulum.  The small bowel is unremarkable.  There is diffuse colonic wall thickening highly suggestive of C difficile colitis.  The pelvic drainage catheter is in good position.  There is only a small amount of residual fluid along the left aspect of the catheter.  This measures approximately 2 cm in its greatest diameter.  The more superiorly there is a 3.5 cm abscess in between small bowel loops at the mid upper sacral level.  There is a Foley catheter in the bladder.  Diffuse bladder wall thickening is noted.  IMPRESSION:  1.  Near complete drainage of the right-sided pelvic abscess. There is a small residual, 2 cm fluid collection noted on the left side of the catheter. 2.  Persistent 3.5 cm abscess higher in the pelvis near small bowel loops. 3.  Diffuse colonic wall thickening consistent with C difficile colitis.  Original Report Authenticated By: P. Loralie Champagne, M.D.    Medications:    . sodium chloride  500 mL Intravenous Once  . amiodarone  200 mg Oral BID  . atorvastatin  20 mg Oral QHS  . ciprofloxacin  500 mg Oral BID  . feeding supplement  237 mL Oral TID BM  . ferrous sulfate  325 mg Oral TID WC  . folic acid  1 mg Oral Daily  . irbesartan  75 mg Oral Daily  . metoprolol succinate  25 mg Oral QPM  . metroNIDAZOLE  500 mg Oral Q8H  . mulitivitamin with minerals  1 tablet Oral Daily  . pantoprazole  40 mg Oral Q1200  . saccharomyces boulardii  250 mg Oral BID  . sodium chloride  3 mL Intravenous Q12H  . thiamine  100 mg Oral  Daily  . cyanocobalamin  100 mcg Oral Daily  . DISCONTD: ciprofloxacin  400 mg Intravenous BID    Assessment/Plan 1. Intra-abdominal abscess by CT. No prior history of diverticulitis or diverticulosis, we still don't know if this is from diverticulitis. Purulent drainage from site after CT guided drain placement 06/03/11.  Repeat CT 5/22 shows the original site is well drained.  There is another 3.5 cm site between the small bowel loops and mid upper sacral area.  This cannot be drained. He has C Diff colitis, and will need treatment for this.  From our standpoint he can go home On C.diff Rx, and get a CT after the Treatment is complete and follow up in our office for drain management after that. He can follow up with DR. Rosenbower who ordered the drain.  Will defer to Medicine and Urology when he can go home and treatment of C. Diff colitis  I will put Dr. Arne Cleveland information in AVS. 2. History of prostate cancer, status post prostatectomy, radiation therapy, hemorrhagic cystitis, ongoing leakage around his foley.  3. Recent hospitalization with hypoxia and acute respiratory failure requiring ventilator support.  4. S/P aortic valve replacement, September 2012  5. Atrial fibrillation controlled with metoprolol and amiodarone.  6. Recent congestive heart failure  7. History of alcohol use and encephalopathy, recurrent DT'S  8. History of stroke with occluded left carotid, status post right carotid endarterectomy.  9.Hyperkalemia  10.Hyponatremia  11. C diff colitis    Plan:  He can go with drain, treat for C. Diff, repeat CT after C. Diff and follow up with Dr. Abbey Chatters after he has repeat CT.  Home health can plan to do routine drain care.  LOS: 9 days    [name] 06/11/2011

## 2011-06-11 NOTE — Evaluation (Signed)
Physical Therapy Evaluation Patient Details Name: [name] MRN: [id number] DOB: 25-Jun-1926 Today's Date: 06/11/2011 Time: 1030-1050 PT Time Calculation (min): 20 min  PT Assessment / Plan / Recommendation Clinical Impression  78 yo male admitted with C-diff,anemia, hematuria. S/p transgluteal pelvic drain placement on 5/16 due to intra-abdominal abscess. Pt mobilizing very well. No physical assist or AD needed. 1x evaluation. No PT follow-up needed.     PT Assessment  Patent does not need any further PT services    Follow Up Recommendations  No PT follow up    Barriers to Discharge        lEquipment Recommendations  None recommended by PT    Recommendations for Other Services     Frequency      Precautions / Restrictions Precautions Precautions: None Restrictions Weight Bearing Restrictions: No   Pertinent Vitals/Pain       Mobility  Bed Mobility Bed Mobility: Not assessed Transfers Transfers: Sit to Stand;Stand to Sit Sit to Stand: 7: Independent Stand to Sit: 7: Independent Ambulation/Gait Ambulation/Gait Assistance: 5: Supervision Ambulation Distance (Feet): 400 Feet Assistive device: None Ambulation/Gait Assistance Details: Good gait speed. Pushed IV pole but was noted dependent on it. Navigated obstacles without LOB or assistance.  Gait Pattern: Within Functional Limits    Exercises     PT Diagnosis:    PT Problem List:   PT Treatment Interventions:     PT Goals    Visit Information  Last PT Received On: 06/11/11 Assistance Needed: +1    Subjective Data  Subjective: "That was great.Marland KitchenMarland KitchenMarland KitchenI enjoyed it" Patient Stated Goal: Home soon   Prior Functioning  Home Living Lives With: Spouse Available Help at Discharge: Family Type of Home: House Home Access: Stairs to enter Secretary/administrator of Steps: 4 Entrance Stairs-Rails: None Home Layout: One level Bathroom Shower/Tub: Patent attorney: None Prior Function Level of Independence: Independent Driving: Yes Communication Communication: No difficulties    Cognition  Overall Cognitive Status: Appears within functional limits for tasks assessed/performed Arousal/Alertness: Awake/alert Orientation Level: Appears intact for tasks assessed Behavior During Session: [facility] for tasks performed    Extremity/Trunk Assessment Right Lower Extremity Assessment RLE ROM/Strength/Tone: Within functional levels RLE Coordination: WFL - gross motor Left Lower Extremity Assessment LLE ROM/Strength/Tone: Within functional levels LLE Coordination: WFL - gross motor Trunk Assessment Trunk Assessment: Normal   Balance    End of Session PT - End of Session Equipment Utilized During Treatment: Gait belt Activity Tolerance: Patient tolerated treatment well Patient left: in chair;with call bell/phone within reach   Rebeca Alert [facility] 06/11/2011, 10:56 AM [phone]

## 2011-06-11 NOTE — Plan of Care (Signed)
Problem: Phase III Progression Outcomes Goal: Foley discontinued Outcome: Not Met (add Reason) CBI

## 2011-06-11 NOTE — Discharge Summary (Signed)
Physician Discharge Summary  Patient ID: [name] MRN: [id number] DOB/AGE: Mar 17, 1926 78 y.o.  Admit date: 06/02/2011 Discharge date: 06/11/2011  PCP: Oliver Barre, MD, MD  Discharge Diagnoses:  Principal Problem:  *Acute blood loss anemia Active Problems:  ADENOCARCINOMA, PROSTATE  HYPERTENSION  CORONARY ARTERY DISEASE  S/P AVR (aortic valve replacement)  Hemorrhagic cystitis  Atrial fibrillation  Intra-abdominal abscess  Hypocalcemia  Hyponatremia   Discharged Condition: fair  Initial History: Pt is an 78 y/o with multiple history of hemorrhagic cystitis. He is s/p radiation therapy to his bladder from history of prostate cancer. Dr. Patsi Sears is his urologist and has been following the patient. Reportedly patient had some cauterization to his bladder which helped stop the bleeding until 3 weeks ago when the patient endorses hematuria. Despite seeing his urologist and getting his foley changed he has still had hematuria and reports that he is starting to feel weaker. As outpatient work-up his urologist ordered a CT of his abdomen which revealed a 6.1x6.1 cm thick -walled collection/abscess in the pelvis with suspected communication arising from the anterior wall of the distal sigmoid/rectum. While in the ED patient was noted to have continued hematuria. They consulted the patient's urologist Dr. Patsi Sears, interventional radiology, and general surgery. In the ED patient was found to have a wbc of 12.3 with a h/h of 9.1/28.3. CBC on 4/23 was 8.2/26.6. Patient was also found to have an elevated potassium level at 6.1 and was given lasix while at the ED. Was also started on IV cipro and flagyl while in the ED.  Hospital Course:   Acute blood loss anemia  Secondary to hematuria. Hgb stable post transfusion. Patient's h/h dropped to 6.7 and he was subsequently transfused 2 units.  Hg 9.1 today   Hemorrhagic cystitis  This has been an on going problem for the patient after  localized radiation therapy for his prostate cancer. Urology is managing. Pt completed carboprost protocol 06/06/11. Continues with foley with 3 way continuous irrigation. Urology input appreciated. Patient will see his urologist in follow up.  Atrial fibrillation  In SR now. Patient had been bradycardic earlier this hospitalization and so BB was put on hold. HR range 58-70. Will continue to hold.   Intra-abdominal abscess  Drain was placed by IR. General surgery followed the patient. Cultures show E. Coli with sensitivities. Transitioned to cipro 5/21 (which was 7th day of antibiotics) as organism is succeptable. Will leave on Cipro till repeat CT and follow up with Gen Surgery. Afebrile non-toxic appearing. WC WNL. Repeat CT yields original abscess decreased in size and 2nd abscess which is not amenable to drainage by catheter. Patient to undergo repeat CT in 10 days and plan will developed at that point.  C-Diff  Positive PCR was detected after he had a few episodes of c-diff. Pt denies frequent loose stool. Patient placed on Flagyl and Florastor. He remains stable.  ADENOCARCINOMA, PROSTATE  Per Urology.   HYPERTENSION  Fair control. SBP range 92-152 Will continue current meds   CORONARY ARTERY DISEASE Aspirin was held initially due to significant bleeding. With improvement in hematuria this can be resumed.   S/P AVR (aortic valve replacement) This issue remained stable.   Hyponatremia:  Initially as low as 128 but seems to be stable at range 130-133. Suspect this was secondary to poor oral solute intake. Diet advanced to low fiber.    Pedal edema  Likely due to fluids and immobilization along with poor nutritional status. Ensure was prescribed and  he was encouraged to ambulate.   Overall patient has significantly improved and remains asymptomatic and afebrile. He has been cleared for discharge by Urology and gen Surg. He is agreeable with plan.    IMAGING STUDIES Ct Guided  Abscess Drain  06/03/2011  *RADIOLOGY REPORT*  Clinical Data: Pelvic abscess, elevated white count, pain  CT GUIDED TRANSGLUTEAL PELVIC ABSCESS DRAINAGE  Date:  06/03/2011 10:15:00  Radiologist:  M. Ruel Favors, M.D.  Medications:  200 mcg Fentanyl  Guidance:  CT  Fluoroscopy time:  None.  Sedation time:  10 minutes  Contrast volume:  None.  Complications:  No immediate  PROCEDURE/FINDINGS:  Informed consent was obtained from the patient following explanation of the procedure, risks, benefits and alternatives. The patient understands, agrees and consents for the procedure. All questions were addressed.  A time out was performed.  Maximal barrier sterile technique utilized including caps, mask, sterile gowns, sterile gloves, large sterile drape, hand hygiene, and betadine  Previous imaging reviewed.  The patient was positioned prone. Noncontrast localization CT performed.  The pelvic fluid collection was localized.  Under sterile conditions and local anesthesia, an 18 gauge 15 cm access needle was advanced from a right transgluteal approach into the collection.  Syringe aspiration yielded blood tinged purulent fluid.  Sample sent for Gram stain and culture. Guide wire advanced followed by dilatation to insert a 10-French drain.  Drain catheter position confirmed with CT.  Catheter connected to an external suction bulb.  Catheter was secured at the skin with a Prolene suture.  Sterile dressing applied.  No immediate complication.  The patient tolerated the procedure well. 20 ml pleural fluid removed initially.  IMPRESSION: Successful CT guided right transgluteal pelvic abscess drainage  Original Report Authenticated By: [name]. Ruel Favors, M.D.   Ct Abdomen Pelvis W Contrast  06/09/2011  *RADIOLOGY REPORT*  Clinical Data: Intra-abdominal abscess.  Gross hematuria.  Recent pelvic abscess drainage.  CT ABDOMEN AND PELVIS WITH CONTRAST  Technique:  Multidetector CT imaging of the abdomen and pelvis was performed  following the standard protocol during bolus administration of intravenous contrast.  Contrast: OMNIPAQUE IOHEXOL 300 MG/ML  SOLN  Comparison: CT scan from recent pelvic abscess drainage.  Findings: The lung bases are clear.  No pleural effusion.  A calcified granuloma is noted at the right lung base.  The liver is unremarkable.  Tiny low attenuation lesion in the left hepatic lobe as likely a benign cyst.  The gallbladder is mildly contracted.  No common bile duct dilatation to the pancreas is unremarkable.  The spleen is normal in size.  No focal lesions. The adrenal glands and kidneys are unremarkable.  The stomach is not well descend with contrast.  No gross abnormalities are seen.  The duodenum demonstrates a large diverticulum.  The small bowel is unremarkable.  There is diffuse colonic wall thickening highly suggestive of C difficile colitis.  The pelvic drainage catheter is in good position.  There is only a small amount of residual fluid along the left aspect of the catheter.  This measures approximately 2 cm in its greatest diameter.  The more superiorly there is a 3.5 cm abscess in between small bowel loops at the mid upper sacral level.  There is a Foley catheter in the bladder.  Diffuse bladder wall thickening is noted.  IMPRESSION:  1.  Near complete drainage of the right-sided pelvic abscess. There is a small residual, 2 cm fluid collection noted on the left side of the catheter. 2.  Persistent 3.5 cm abscess higher in the pelvis near small bowel loops. 3.  Diffuse colonic wall thickening consistent with C difficile colitis.  Original Report Authenticated By: P. Loralie Champagne, M.D.    Discharge Exam: Blood pressure 135/66, pulse 68, temperature 98.7 F (37.1 C), temperature source Oral, resp. rate 18, height 5\' 11"  (1.803 m), weight 71.3 kg (157 lb 3 oz), SpO2 99.00%. General appearance: alert, cooperative and no distress Head: Normocephalic, without obvious abnormality,  atraumatic Resp: clear to auscultation bilaterally Cardio: regular rate and rhythm, S1, S2 normal, no murmur, click, rub or gallop GI: soft, non-tender; bowel sounds normal; no masses,  no organomegaly Extremities: edema 2+ Neurologic: Grossly normal  Disposition: Home  Discharge Orders    Future Appointments: Provider: Department: Dept Phone: Center:   06/18/2011 2:45 PM Rollene Rotunda, MD [facility] [phone] [facility]   06/28/2011 12:15 PM [facility] [phone] [facility]   08/03/2011 1:00 PM Corwin Levins, MD [facility] [phone] [facility]     Future Orders Please Complete By Expires   CT Abdomen Pelvis W Contrast  06/22/11 06/21/12   Questions: Responses:   Preferred imaging location? [facility]   Reason for exam: follow up abscess   Diet - low sodium heart healthy      Increase activity slowly      Discharge instructions      Comments:   Be sure to follow up with Dr. Patsi Sears and Dr. Luisa Hart     Current Discharge Medication List    START taking these medications   Details  ciprofloxacin (CIPRO) 500 MG tablet Take 1 tablet (500 mg total) by mouth 2 (two) times daily. Qty: 42 tablet, Refills: 0    feeding supplement (ENSURE COMPLETE) LIQD Take 237 mLs by mouth 3 (three) times daily between meals. Qty: 90 Bottle, Refills: 0    metroNIDAZOLE (FLAGYL) 500 MG tablet Take 1 tablet (500 mg total) by mouth every 8 (eight) hours. Qty: 63 tablet, Refills: 0    saccharomyces boulardii (FLORASTOR) 250 MG capsule Take 1 capsule (250 mg total) by mouth 2 (two) times daily. For 2 weeks Qty: 28 capsule, Refills: 0      CONTINUE these medications which have NOT CHANGED   Details  amiodarone (PACERONE) 200 MG tablet Take 200 mg by mouth 2 (two) times daily.    aspirin 81 MG tablet Take 81 mg by mouth daily.    atorvastatin (LIPITOR) 20 MG tablet Take 1 tablet (20 mg total) by mouth daily. Qty: 90 tablet, Refills: 2    ferrous sulfate  (FERROUSUL) 325 (65 FE) MG tablet Take 1 tablet (325 mg total) by mouth 2 (two) times daily. Qty: 60 tablet, Refills: 11    folic acid (FOLVITE) 1 MG tablet Take 1 tablet (1 mg total) by mouth daily. Qty: 30 tablet, Refills: 0    haloperidol (HALDOL) 5 MG tablet Take 1 tablet (5 mg total) by mouth 2 (two) times daily. Qty: 60 tablet, Refills: 2    metoprolol succinate (TOPROL-XL) 25 MG 24 hr tablet Take 1 tablet (25 mg total) by mouth daily. Qty: 90 tablet, Refills: 2    Multiple Vitamin (MULITIVITAMIN WITH MINERALS) TABS Take 1 tablet by mouth daily.    omeprazole (PRILOSEC) 40 MG capsule Take 40 mg by mouth daily.      potassium chloride SA (K-DUR,KLOR-CON) 20 MEQ tablet Take 20 mEq by mouth daily.    thiamine 100 MG tablet Take 1 tablet (100 mg total) by mouth  daily. Qty: 30 tablet, Refills: 0    valsartan (DIOVAN) 80 MG tablet Take 1 tablet (80 mg total) by mouth daily. Qty: 90 tablet, Refills: 2    Vitamins A & D (VITAMIN A & D) cream Apply 1 application topically daily.      STOP taking these medications     cyanocobalamin 100 MCG tablet        Follow-up Information    Schedule an appointment as soon as possible for a visit with ROSENBOWER,TODD J, MD. (Ask for an appointment after you have your repeat CT scan .)    Contact information:   [facility] [address] [place] [address] [phone]       Follow up with Jethro Bolus I, MD. Schedule an appointment as soon as possible for a visit in 1 week. (for blood in urine)    Contact information:   [address] [place] [facility] [place] [address] [phone]       Schedule an appointment as soon as possible for a visit with [facility]. (Ct of abdomen and pelvis with contrast with contrast through drain as well on June 22 2011.)    Contact information:   [address] [place]  [address] [phone]         Total Discharge Time: 35 mins  [facility]  [facility] Pager [phone]  06/11/2011, 12:14 PM

## 2011-06-11 NOTE — Progress Notes (Signed)
Agree.  Can go home with OP follow up

## 2011-06-11 NOTE — Progress Notes (Signed)
Subjective: Patient reports feeling good and "want to go home". Note GS note this AM re: discharge. Currently, urine clear, with some mucous. No clot.   Objective: Vital signs in last 24 hours: Temp:  [98.5 F (36.9 C)-98.7 F (37.1 C)] 98.7 F (37.1 C) (05/24 0540) Pulse Rate:  [67-68] 68  (05/24 0540) Resp:  [18] 18  (05/24 0540) BP: (117-135)/(57-66) 135/66 mmHg (05/24 0540) SpO2:  [98 %-99 %] 99 % (05/24 0540)A  Intake/Output from previous day: 05/23 0701 - 05/24 0700 In: 265 [P.O.:240] Out: 8650 [Urine:8625; Drains:10] Intake/Output this shift: Total I/O In: 480 [P.O.:480] Out: 1300 [Urine:1300]  Past Medical History  Diagnosis Date  . ADENOCARCINOMA, PROSTATE   . ALLERGIC RHINITIS   . B12 DEFICIENCY   . BENIGN PROSTATIC HYPERTROPHY   . CAROTID ARTERY DISEASE   . CEREBROVASCULAR ACCIDENT, HX OF   . CORONARY ARTERY DISEASE   . DIVERTICULOSIS, COLON   . GERD   . HEMORRHOIDS   . HIATAL HERNIA   . HYPERLIPIDEMIA   . HYPERTENSION   . OSTEOPENIA   . PEPTIC ULCER DISEASE   . PERIPHERAL VASCULAR DISEASE   . Personal history of alcoholism   . PROSTATE CANCER, HX OF   . PUD, HX OF   . TRANSIENT ISCHEMIC ATTACK   . Shingles   . Anemia   . AS (aortic stenosis)   . S/P AVR (aortic valve replacement) 09/23/2010  . Complication of anesthesia 09/23/2010    very confused after waking up. Stopped drinking 40 days before this surgery.  . Transfusion reaction, chill fever type 04/28/2011    Fever, rigors, tachycardia,tachypnea but no evidence of hemolysis  . Hemorrhagic cystitis 04/28/2011    Due to previous radiation for PSA recurrence of prostate cancer    Physical Exam:  General: thin w male in NAD. Lungs - Normal respiratory effort, chest expands symmetrically.  Abdomen - Soft, non-tender & non-distended.  Lab Results:  Basename 06/11/11 0435 06/10/11 0440 06/09/11 0448  WBC 8.6 7.3 9.0  HGB 9.1* 9.6* 10.1*  HCT 28.1* 29.3* 31.3*   BMET  Basename 06/11/11 0435  06/10/11 0440  NA 133* 130*  K 3.9 4.2  CL 99 97  CO2 24 24  GLUCOSE 97 94  BUN 9 5*  CREATININE 1.06 0.89  CALCIUM 8.3* 9.0   No results found for this basename: LABURIN:1 in the last 72 hours Results for orders placed during the hospital encounter of 06/02/11  CULTURE, ROUTINE-ABSCESS     Status: Normal   Collection Time   06/03/11 12:53 PM      Component Value Range Status Comment   Specimen Description ABSCESS PELVIC   Final    Special Requests Normal   Final    Gram Stain     Final    Value: FEW WBC PRESENT,BOTH PMN AND MONONUCLEAR     FEW SQUAMOUS EPITHELIAL CELLS PRESENT     NO ORGANISMS SEEN   Culture FEW ESCHERICHIA COLI   Final    Report Status 06/07/2011 FINAL   Final    Organism ID, Bacteria ESCHERICHIA COLI   Final   CLOSTRIDIUM DIFFICILE BY PCR     Status: Abnormal   Collection Time   06/09/11  5:55 PM      Component Value Range Status Comment   C difficile by pcr POSITIVE (*) NEGATIVE  Final     Studies/Results: @RISRSLT24 @  Assessment/Plan: 1. C. Diff enterocolitis, 2nd to pelvic abscess-drained percutaneously. Although he still has some abscess left,  he is AF, and asymptomatic, and could be managed at this point as o.p. ( per GS). He has stopped bleeding, and advise leaving foley catheter in place for now. He is to have CT in 1 week, then see Dr. Purnell Shoemaker. He should see me in approx. 10 days for ? Foley d/c. Antibiotics per [facility]. For C. Dif and abscess coverage.   Sunset Joshi I 06/11/2011, 1:03 PM

## 2011-06-11 NOTE — Progress Notes (Signed)
Pt had an asymptomatic 5 beat run of VT. Dr Rito Ehrlich notified. Will continue to monitor.  Audrie Lia

## 2011-06-18 ENCOUNTER — Ambulatory Visit (INDEPENDENT_AMBULATORY_CARE_PROVIDER_SITE_OTHER): Payer: Medicare Other | Admitting: Cardiology

## 2011-06-18 ENCOUNTER — Encounter: Payer: Self-pay | Admitting: Cardiology

## 2011-06-18 VITALS — BP 119/52 | HR 68 | Ht 71.0 in | Wt 162.8 lb

## 2011-06-18 DIAGNOSIS — I4891 Unspecified atrial fibrillation: Secondary | ICD-10-CM

## 2011-06-18 DIAGNOSIS — I10 Essential (primary) hypertension: Secondary | ICD-10-CM

## 2011-06-18 DIAGNOSIS — Z952 Presence of prosthetic heart valve: Secondary | ICD-10-CM

## 2011-06-18 DIAGNOSIS — I359 Nonrheumatic aortic valve disorder, unspecified: Secondary | ICD-10-CM

## 2011-06-18 DIAGNOSIS — I2510 Atherosclerotic heart disease of native coronary artery without angina pectoris: Secondary | ICD-10-CM

## 2011-06-18 MED ORDER — VALSARTAN 40 MG PO TABS: 40.0000 mg | ORAL_TABLET | Freq: Every day | ORAL | Status: DC

## 2011-06-18 MED ORDER — POTASSIUM CHLORIDE CRYS ER 20 MEQ PO TBCR: 20.0000 meq | EXTENDED_RELEASE_TABLET | Freq: Every day | ORAL | Status: DC

## 2011-06-18 NOTE — Assessment & Plan Note (Signed)
Since leaving the hospital there's been no documented fibrillation. He is tolerating amiodarone. He cannot take anticoagulation with ongoing hematuria. No change in therapy is indicated. He will have appropriate surveillance labs.

## 2011-06-18 NOTE — Assessment & Plan Note (Signed)
He has no chest pain. Despite the mild troponin elevation no change in therapy is indicated.

## 2011-06-18 NOTE — Assessment & Plan Note (Signed)
His blood pressure was actually running low and I will reduce his Diovan to 40 mg daily.

## 2011-06-18 NOTE — Progress Notes (Signed)
HPI The patient presents for followup of after AVR.  Since last saw him he has had a very difficult medical course here he is he has had hematuria, intra-abdominal abscess, respiratory failure among other problems with subsequent hospitalizations rehabilitation and nursing home stays. I reviewed multiple hospital records. Of this stems from his previous problems with prostate cancer. During his hospitalizations he was seen by our service for paroxysmal atrial fibrillation. It was a slightly elevated enzyme thought to be related to this rhythm. There was some suggestion of heart failure with diastolic dysfunction. However, I reviewed an echocardiogram which demonstrated a well preserved ejection fraction and normally functioning aortic valve replacement. He's currently home but very weak. He has persistent mild bilateral ankle edema. He has persistent hypotension and his wife has been holding his medications at times. He's not been having any chest pressure, neck or arm discomfort. He's not describing any new PND or orthopnea. There has been no palpitations, presyncope or syncope.  Allergies  Allergen Reactions  . Tolterodine Tartrate     Current Outpatient Prescriptions  Medication Sig Dispense Refill  . amiodarone (PACERONE) 200 MG tablet Take 200 mg by mouth 2 (two) times daily.      Marland Kitchen aspirin 81 MG tablet Take 81 mg by mouth daily.      Marland Kitchen atorvastatin (LIPITOR) 20 MG tablet Take 1 tablet (20 mg total) by mouth daily.  90 tablet  2  . ciprofloxacin (CIPRO) 500 MG tablet Take 1 tablet (500 mg total) by mouth 2 (two) times daily.  42 tablet  0  . feeding supplement (ENSURE COMPLETE) LIQD Take 237 mLs by mouth 3 (three) times daily between meals.  90 Bottle  0  . ferrous sulfate (FERROUSUL) 325 (65 FE) MG tablet Take 1 tablet (325 mg total) by mouth 2 (two) times daily.  60 tablet  11  . LORazepam (ATIVAN) 1 MG tablet Take 1 mg by mouth every 8 (eight) hours.      . metoprolol succinate  (TOPROL-XL) 25 MG 24 hr tablet Take 1 tablet (25 mg total) by mouth daily.  90 tablet  2  . metroNIDAZOLE (FLAGYL) 500 MG tablet Take 1 tablet (500 mg total) by mouth every 8 (eight) hours.  63 tablet  0  . Multiple Vitamin (MULITIVITAMIN WITH MINERALS) TABS Take 1 tablet by mouth daily.      Marland Kitchen omeprazole (PRILOSEC) 40 MG capsule Take 40 mg by mouth daily.        Marland Kitchen saccharomyces boulardii (FLORASTOR) 250 MG capsule Take 1 capsule (250 mg total) by mouth 2 (two) times daily. For 2 weeks  28 capsule  0  . thiamine 100 MG tablet Take 1 tablet (100 mg total) by mouth daily.  30 tablet  0  . valsartan (DIOVAN) 80 MG tablet Take 1 tablet (80 mg total) by mouth daily.  90 tablet  2  . Vitamins A & D (VITAMIN A & D) cream Apply 1 application topically daily.        Past Medical History  Diagnosis Date  . ADENOCARCINOMA, PROSTATE   . ALLERGIC RHINITIS   . B12 DEFICIENCY   . BENIGN PROSTATIC HYPERTROPHY   . CAROTID ARTERY DISEASE   . CEREBROVASCULAR ACCIDENT, HX OF   . CORONARY ARTERY DISEASE   . DIVERTICULOSIS, COLON   . GERD   . HEMORRHOIDS   . HIATAL HERNIA   . HYPERLIPIDEMIA   . HYPERTENSION   . OSTEOPENIA   . PEPTIC ULCER DISEASE   .  PERIPHERAL VASCULAR DISEASE   . Personal history of alcoholism   . PROSTATE CANCER, HX OF   . PUD, HX OF   . TRANSIENT ISCHEMIC ATTACK   . Shingles   . Anemia   . AS (aortic stenosis)   . S/P AVR (aortic valve replacement) 09/23/2010  . Complication of anesthesia 09/23/2010    very confused after waking up. Stopped drinking 40 days before this surgery.  . Transfusion reaction, chill fever type 04/28/2011    Fever, rigors, tachycardia,tachypnea but no evidence of hemolysis  . Hemorrhagic cystitis 04/28/2011    Due to previous radiation for PSA recurrence of prostate cancer    Past Surgical History  Procedure Date  . Popliteal synovial cyst excision   . Right carotid endarterectomy 1999    Left carotid total chronic occlusion  . Prostate surgery   .  Cardic stent 2004  . Aortic valve replacement 09/23/2010    #38mm [facility] Ease pericardial tissue valve  . Coronary artery bypass graft 09/23/2010    CABG x1 using LIMA to LAD  . Cystoscopy with biopsy 02/15/2011    Procedure: CYSTOSCOPY WITH BIOPSY;  Surgeon: Anner Crete, MD;  Location: [facility];  Service: Urology;  Laterality: N/A;  Cystoscopy, biopsy and fulguration of the bladder    ROS:  As stated in the HPI and negative for all other systems.  PHYSICAL EXAM BP 119/52  Pulse 68  Ht 5\' 11"  (1.803 m)  Wt 162 lb 12.8 oz (73.846 kg)  BMI 22.71 kg/m2 GENERAL:  Frail appearing HEENT:  Pupils equal round and reactive, fundi not visualized, oral mucosa unremarkable NECK:  No jugular venous distention, waveform within normal limits, carotid upstroke brisk and symmetric, soft right bruits, no thyromegaly, right CEA scar LYMPHATICS:  No cervical, inguinal adenopathy LUNGS:  Clear to auscultation bilaterally BACK:  No CVA tenderness, JP drain in the back. CHEST:  Well healed sternotomy scar. HEART:  PMI not displaced or sustained,S1 and S2 within normal limits, no S3, no S4, no clicks, no rubs, early peaking apical systolic murmur only at the apex ABD:  Flat, positive bowel sounds normal in frequency in pitch, no bruits, no rebound, no guarding, no midline pulsatile mass, no hepatomegaly, no splenomegaly EXT:  2 plus pulses throughout, mild edema, no cyanosis no clubbing SKIN:  No rashes no nodules NEURO:  Cranial nerves II through XII grossly intact, motor grossly intact throughout PSYCH:  Cognitively intact, oriented to person place and time, slight confusion at times talking to him today.   ASSESSMENT AND PLAN

## 2011-06-18 NOTE — Assessment & Plan Note (Signed)
This seems to be stable.  No change in therapy or imaging is needed.

## 2011-06-18 NOTE — Patient Instructions (Signed)
Please decrease Valsartan to 40 mg daily Continue all other medications as listed  Follow up in 6 months with Dr Antoine Poche.  You will receive a letter in the mail 2 months before you are due.  Please call us when you receive this letter to schedule your follow up appointment.

## 2011-06-22 ENCOUNTER — Ambulatory Visit (HOSPITAL_COMMUNITY)
Admission: RE | Admit: 2011-06-22 | Discharge: 2011-06-22 | Disposition: A | Discharge status: Home / Self Care | Payer: Medicare Other | Source: Ambulatory Visit | Attending: Internal Medicine | Admitting: Internal Medicine

## 2011-06-22 DIAGNOSIS — K651 Peritoneal abscess: Secondary | ICD-10-CM | POA: Insufficient documentation

## 2011-06-22 DIAGNOSIS — Y838 Other surgical procedures as the cause of abnormal reaction of the patient, or of later complication, without mention of misadventure at the time of the procedure: Secondary | ICD-10-CM | POA: Insufficient documentation

## 2011-06-22 DIAGNOSIS — T85698A Other mechanical complication of other specified internal prosthetic devices, implants and grafts, initial encounter: Secondary | ICD-10-CM | POA: Insufficient documentation

## 2011-06-22 DIAGNOSIS — K8020 Calculus of gallbladder without cholecystitis without obstruction: Secondary | ICD-10-CM | POA: Insufficient documentation

## 2011-06-22 DIAGNOSIS — I700 Atherosclerosis of aorta: Secondary | ICD-10-CM | POA: Insufficient documentation

## 2011-06-22 MED ORDER — IOHEXOL 300 MG/ML  SOLN
100.0000 mL | Freq: Once | INTRAMUSCULAR | Status: AC | PRN
  Administered 2011-06-22: 100 mL via INTRAVENOUS

## 2011-06-23 ENCOUNTER — Ambulatory Visit (INDEPENDENT_AMBULATORY_CARE_PROVIDER_SITE_OTHER): Payer: Medicare Other | Admitting: General Surgery

## 2011-06-23 ENCOUNTER — Encounter (INDEPENDENT_AMBULATORY_CARE_PROVIDER_SITE_OTHER): Payer: Self-pay | Admitting: General Surgery

## 2011-06-23 VITALS — BP 110/70 | HR 45 | Temp 97.4°F | Ht 71.0 in | Wt 162.0 lb

## 2011-06-23 DIAGNOSIS — Z09 Encounter for follow-up examination after completed treatment for conditions other than malignant neoplasm: Secondary | ICD-10-CM

## 2011-06-23 NOTE — Patient Instructions (Signed)
No intestinal surgery is needed.

## 2011-06-23 NOTE — Progress Notes (Signed)
Subjective:     Patient ID: [name], male   DOB: [birth year], 78 y.o.   MRN: [id number]  HPI He is here for a followup visit after his hospital stay for a pelvic abscess that was drained percutaneously. It is not clear whether this was a result of a microperforation of his bladder or large intestine. He continues to have a Foley in with some gross hematuria.  Review of Systems GU-still has family and with some hematuria.  GI-some constipation with lower abdominal pain which resolves when he has a bowel movement.    Objective:   Physical Exam General -he looks well and is in no acute distress.  Abdomen-soft and nontender  GU-Foley in place with slightly bloody output    Assessment:     Pelvic abscess with unclear etiology. This has resolved.    Plan:     I do not think he needs either further workup from my standpoint. He has significant comorbidities which make any type of operation very risky. Return visit p.r.n.

## 2011-06-28 ENCOUNTER — Encounter (HOSPITAL_BASED_OUTPATIENT_CLINIC_OR_DEPARTMENT_OTHER): Payer: Medicare Other | Attending: Internal Medicine

## 2011-06-28 DIAGNOSIS — Z954 Presence of other heart-valve replacement: Secondary | ICD-10-CM | POA: Insufficient documentation

## 2011-06-28 DIAGNOSIS — I10 Essential (primary) hypertension: Secondary | ICD-10-CM | POA: Insufficient documentation

## 2011-06-28 DIAGNOSIS — Z951 Presence of aortocoronary bypass graft: Secondary | ICD-10-CM | POA: Insufficient documentation

## 2011-06-28 DIAGNOSIS — T66XXXS Radiation sickness, unspecified, sequela: Secondary | ICD-10-CM | POA: Insufficient documentation

## 2011-06-28 DIAGNOSIS — Z8673 Personal history of transient ischemic attack (TIA), and cerebral infarction without residual deficits: Secondary | ICD-10-CM | POA: Insufficient documentation

## 2011-06-28 DIAGNOSIS — N3040 Irradiation cystitis without hematuria: Secondary | ICD-10-CM | POA: Insufficient documentation

## 2011-06-28 DIAGNOSIS — Y842 Radiological procedure and radiotherapy as the cause of abnormal reaction of the patient, or of later complication, without mention of misadventure at the time of the procedure: Secondary | ICD-10-CM | POA: Insufficient documentation

## 2011-06-28 DIAGNOSIS — Z8546 Personal history of malignant neoplasm of prostate: Secondary | ICD-10-CM | POA: Insufficient documentation

## 2011-06-28 DIAGNOSIS — Z8551 Personal history of malignant neoplasm of bladder: Secondary | ICD-10-CM | POA: Insufficient documentation

## 2011-06-28 DIAGNOSIS — Z79899 Other long term (current) drug therapy: Secondary | ICD-10-CM | POA: Insufficient documentation

## 2011-07-06 ENCOUNTER — Other Ambulatory Visit (HOSPITAL_BASED_OUTPATIENT_CLINIC_OR_DEPARTMENT_OTHER): Payer: Self-pay | Admitting: Internal Medicine

## 2011-07-06 ENCOUNTER — Ambulatory Visit (HOSPITAL_COMMUNITY)
Admission: RE | Admit: 2011-07-06 | Discharge: 2011-07-06 | Disposition: A | Discharge status: Home / Self Care | Payer: Medicare Other | Source: Ambulatory Visit | Attending: Internal Medicine | Admitting: Internal Medicine

## 2011-07-06 DIAGNOSIS — I700 Atherosclerosis of aorta: Secondary | ICD-10-CM | POA: Insufficient documentation

## 2011-07-06 DIAGNOSIS — T8189XA Other complications of procedures, not elsewhere classified, initial encounter: Secondary | ICD-10-CM

## 2011-07-06 DIAGNOSIS — Y849 Medical procedure, unspecified as the cause of abnormal reaction of the patient, or of later complication, without mention of misadventure at the time of the procedure: Secondary | ICD-10-CM | POA: Insufficient documentation

## 2011-07-06 DIAGNOSIS — Z954 Presence of other heart-valve replacement: Secondary | ICD-10-CM | POA: Insufficient documentation

## 2011-07-06 DIAGNOSIS — I2510 Atherosclerotic heart disease of native coronary artery without angina pectoris: Secondary | ICD-10-CM | POA: Insufficient documentation

## 2011-07-06 DIAGNOSIS — Z8546 Personal history of malignant neoplasm of prostate: Secondary | ICD-10-CM | POA: Insufficient documentation

## 2011-07-06 DIAGNOSIS — Z01818 Encounter for other preprocedural examination: Secondary | ICD-10-CM | POA: Insufficient documentation

## 2011-07-13 LAB — GLUCOSE, CAPILLARY: Glucose-Capillary: 104 mg/dL — ABNORMAL HIGH (ref 70–99)

## 2011-07-19 ENCOUNTER — Ambulatory Visit: Payer: Medicare Other | Admitting: Internal Medicine

## 2011-07-19 ENCOUNTER — Encounter (HOSPITAL_BASED_OUTPATIENT_CLINIC_OR_DEPARTMENT_OTHER): Payer: Medicare Other | Attending: Internal Medicine

## 2011-07-19 DIAGNOSIS — Z79899 Other long term (current) drug therapy: Secondary | ICD-10-CM | POA: Insufficient documentation

## 2011-07-19 DIAGNOSIS — Y842 Radiological procedure and radiotherapy as the cause of abnormal reaction of the patient, or of later complication, without mention of misadventure at the time of the procedure: Secondary | ICD-10-CM | POA: Insufficient documentation

## 2011-07-19 DIAGNOSIS — C670 Malignant neoplasm of trigone of bladder: Secondary | ICD-10-CM | POA: Insufficient documentation

## 2011-07-19 DIAGNOSIS — I10 Essential (primary) hypertension: Secondary | ICD-10-CM | POA: Insufficient documentation

## 2011-07-19 DIAGNOSIS — C61 Malignant neoplasm of prostate: Secondary | ICD-10-CM | POA: Insufficient documentation

## 2011-07-19 DIAGNOSIS — N3040 Irradiation cystitis without hematuria: Secondary | ICD-10-CM | POA: Insufficient documentation

## 2011-07-20 ENCOUNTER — Encounter: Payer: Self-pay | Admitting: Internal Medicine

## 2011-07-20 DIAGNOSIS — K279 Peptic ulcer, site unspecified, unspecified as acute or chronic, without hemorrhage or perforation: Secondary | ICD-10-CM | POA: Insufficient documentation

## 2011-07-20 DIAGNOSIS — I10 Essential (primary) hypertension: Secondary | ICD-10-CM | POA: Insufficient documentation

## 2011-07-20 DIAGNOSIS — M949 Disorder of cartilage, unspecified: Secondary | ICD-10-CM

## 2011-07-20 DIAGNOSIS — I739 Peripheral vascular disease, unspecified: Secondary | ICD-10-CM | POA: Insufficient documentation

## 2011-07-20 DIAGNOSIS — G459 Transient cerebral ischemic attack, unspecified: Secondary | ICD-10-CM | POA: Insufficient documentation

## 2011-07-20 DIAGNOSIS — K5730 Diverticulosis of large intestine without perforation or abscess without bleeding: Secondary | ICD-10-CM | POA: Insufficient documentation

## 2011-07-20 DIAGNOSIS — Z0000 Encounter for general adult medical examination without abnormal findings: Secondary | ICD-10-CM | POA: Insufficient documentation

## 2011-07-20 DIAGNOSIS — E785 Hyperlipidemia, unspecified: Secondary | ICD-10-CM | POA: Insufficient documentation

## 2011-07-20 DIAGNOSIS — B029 Zoster without complications: Secondary | ICD-10-CM | POA: Insufficient documentation

## 2011-07-20 DIAGNOSIS — F1021 Alcohol dependence, in remission: Secondary | ICD-10-CM | POA: Insufficient documentation

## 2011-07-20 DIAGNOSIS — M899 Disorder of bone, unspecified: Secondary | ICD-10-CM | POA: Insufficient documentation

## 2011-07-21 ENCOUNTER — Encounter: Payer: Self-pay | Admitting: Internal Medicine

## 2011-07-21 ENCOUNTER — Ambulatory Visit (INDEPENDENT_AMBULATORY_CARE_PROVIDER_SITE_OTHER): Payer: Medicare Other | Admitting: Internal Medicine

## 2011-07-21 ENCOUNTER — Encounter (HOSPITAL_COMMUNITY): Payer: Self-pay | Admitting: Emergency Medicine

## 2011-07-21 ENCOUNTER — Emergency Department (HOSPITAL_COMMUNITY): Payer: Medicare Other

## 2011-07-21 ENCOUNTER — Inpatient Hospital Stay (HOSPITAL_COMMUNITY)
Admission: EM | Admit: 2011-07-21 | Discharge: 2011-07-24 | DRG: 812 | Disposition: A | Discharge status: Home / Self Care | Payer: Medicare Other | Attending: Family Medicine | Admitting: Family Medicine

## 2011-07-21 VITALS — BP 98/52 | HR 50 | Temp 96.4°F

## 2011-07-21 DIAGNOSIS — E785 Hyperlipidemia, unspecified: Secondary | ICD-10-CM

## 2011-07-21 DIAGNOSIS — K219 Gastro-esophageal reflux disease without esophagitis: Secondary | ICD-10-CM

## 2011-07-21 DIAGNOSIS — K651 Peritoneal abscess: Secondary | ICD-10-CM

## 2011-07-21 DIAGNOSIS — Z8673 Personal history of transient ischemic attack (TIA), and cerebral infarction without residual deficits: Secondary | ICD-10-CM

## 2011-07-21 DIAGNOSIS — S51009A Unspecified open wound of unspecified elbow, initial encounter: Secondary | ICD-10-CM

## 2011-07-21 DIAGNOSIS — R197 Diarrhea, unspecified: Secondary | ICD-10-CM | POA: Diagnosis present

## 2011-07-21 DIAGNOSIS — R296 Repeated falls: Secondary | ICD-10-CM | POA: Insufficient documentation

## 2011-07-21 DIAGNOSIS — R001 Bradycardia, unspecified: Secondary | ICD-10-CM

## 2011-07-21 DIAGNOSIS — D72829 Elevated white blood cell count, unspecified: Secondary | ICD-10-CM | POA: Diagnosis present

## 2011-07-21 DIAGNOSIS — T66XXXS Radiation sickness, unspecified, sequela: Secondary | ICD-10-CM

## 2011-07-21 DIAGNOSIS — K529 Noninfective gastroenteritis and colitis, unspecified: Secondary | ICD-10-CM

## 2011-07-21 DIAGNOSIS — R310 Gross hematuria: Secondary | ICD-10-CM

## 2011-07-21 DIAGNOSIS — N3091 Cystitis, unspecified with hematuria: Secondary | ICD-10-CM

## 2011-07-21 DIAGNOSIS — Z8619 Personal history of other infectious and parasitic diseases: Secondary | ICD-10-CM

## 2011-07-21 DIAGNOSIS — S51019A Laceration without foreign body of unspecified elbow, initial encounter: Secondary | ICD-10-CM

## 2011-07-21 DIAGNOSIS — B029 Zoster without complications: Secondary | ICD-10-CM

## 2011-07-21 DIAGNOSIS — I10 Essential (primary) hypertension: Secondary | ICD-10-CM

## 2011-07-21 DIAGNOSIS — D62 Acute posthemorrhagic anemia: Secondary | ICD-10-CM

## 2011-07-21 DIAGNOSIS — E43 Unspecified severe protein-calorie malnutrition: Secondary | ICD-10-CM

## 2011-07-21 DIAGNOSIS — E871 Hypo-osmolality and hyponatremia: Secondary | ICD-10-CM

## 2011-07-21 DIAGNOSIS — I739 Peripheral vascular disease, unspecified: Secondary | ICD-10-CM

## 2011-07-21 DIAGNOSIS — N3040 Irradiation cystitis without hematuria: Secondary | ICD-10-CM | POA: Diagnosis present

## 2011-07-21 DIAGNOSIS — A0472 Enterocolitis due to Clostridium difficile, not specified as recurrent: Secondary | ICD-10-CM | POA: Diagnosis present

## 2011-07-21 DIAGNOSIS — I498 Other specified cardiac arrhythmias: Secondary | ICD-10-CM

## 2011-07-21 DIAGNOSIS — B9620 Unspecified Escherichia coli [E. coli] as the cause of diseases classified elsewhere: Secondary | ICD-10-CM

## 2011-07-21 DIAGNOSIS — Z9181 History of falling: Secondary | ICD-10-CM

## 2011-07-21 DIAGNOSIS — C61 Malignant neoplasm of prostate: Secondary | ICD-10-CM

## 2011-07-21 DIAGNOSIS — Z952 Presence of prosthetic heart valve: Secondary | ICD-10-CM

## 2011-07-21 DIAGNOSIS — M949 Disorder of cartilage, unspecified: Secondary | ICD-10-CM

## 2011-07-21 DIAGNOSIS — R319 Hematuria, unspecified: Secondary | ICD-10-CM

## 2011-07-21 DIAGNOSIS — F1021 Alcohol dependence, in remission: Secondary | ICD-10-CM

## 2011-07-21 DIAGNOSIS — F3289 Other specified depressive episodes: Secondary | ICD-10-CM | POA: Diagnosis present

## 2011-07-21 DIAGNOSIS — N179 Acute kidney failure, unspecified: Secondary | ICD-10-CM

## 2011-07-21 DIAGNOSIS — Z79899 Other long term (current) drug therapy: Secondary | ICD-10-CM

## 2011-07-21 DIAGNOSIS — Z0000 Encounter for general adult medical examination without abnormal findings: Secondary | ICD-10-CM

## 2011-07-21 DIAGNOSIS — F411 Generalized anxiety disorder: Secondary | ICD-10-CM | POA: Diagnosis present

## 2011-07-21 DIAGNOSIS — D649 Anemia, unspecified: Secondary | ICD-10-CM

## 2011-07-21 DIAGNOSIS — E039 Hypothyroidism, unspecified: Secondary | ICD-10-CM

## 2011-07-21 DIAGNOSIS — I4891 Unspecified atrial fibrillation: Secondary | ICD-10-CM

## 2011-07-21 DIAGNOSIS — Y842 Radiological procedure and radiotherapy as the cause of abnormal reaction of the patient, or of later complication, without mention of misadventure at the time of the procedure: Secondary | ICD-10-CM | POA: Diagnosis present

## 2011-07-21 DIAGNOSIS — I2510 Atherosclerotic heart disease of native coronary artery without angina pectoris: Secondary | ICD-10-CM

## 2011-07-21 DIAGNOSIS — F329 Major depressive disorder, single episode, unspecified: Secondary | ICD-10-CM | POA: Diagnosis present

## 2011-07-21 DIAGNOSIS — M899 Disorder of bone, unspecified: Secondary | ICD-10-CM

## 2011-07-21 DIAGNOSIS — E876 Hypokalemia: Secondary | ICD-10-CM | POA: Diagnosis present

## 2011-07-21 DIAGNOSIS — K5730 Diverticulosis of large intestine without perforation or abscess without bleeding: Secondary | ICD-10-CM

## 2011-07-21 DIAGNOSIS — G459 Transient cerebral ischemic attack, unspecified: Secondary | ICD-10-CM

## 2011-07-21 DIAGNOSIS — Z8679 Personal history of other diseases of the circulatory system: Secondary | ICD-10-CM

## 2011-07-21 DIAGNOSIS — K279 Peptic ulcer, site unspecified, unspecified as acute or chronic, without hemorrhage or perforation: Secondary | ICD-10-CM

## 2011-07-21 HISTORY — DX: Reserved for inherently not codable concepts without codable children: IMO0001

## 2011-07-21 HISTORY — DX: Gross hematuria: R31.0

## 2011-07-21 HISTORY — DX: Encounter for other specified aftercare: Z51.89

## 2011-07-21 LAB — RETICULOCYTES
RBC.: 2.58 MIL/uL — ABNORMAL LOW (ref 4.22–5.81)
Retic Ct Pct: 4 % — ABNORMAL HIGH (ref 0.4–3.1)

## 2011-07-21 LAB — DIFFERENTIAL
Basophils Relative: 1 % (ref 0–1)
Lymphs Abs: 0.8 10*3/uL (ref 0.7–4.0)
Monocytes Relative: 14 % — ABNORMAL HIGH (ref 3–12)
Neutro Abs: 3.5 10*3/uL (ref 1.7–7.7)
Neutrophils Relative %: 68 % (ref 43–77)

## 2011-07-21 LAB — URINALYSIS, ROUTINE W REFLEX MICROSCOPIC
Ketones, ur: NEGATIVE mg/dL
Nitrite: NEGATIVE
Specific Gravity, Urine: 1.03 (ref 1.005–1.030)
Urobilinogen, UA: 0.2 mg/dL (ref 0.0–1.0)

## 2011-07-21 LAB — COMPREHENSIVE METABOLIC PANEL
Albumin: 2.8 g/dL — ABNORMAL LOW (ref 3.5–5.2)
Alkaline Phosphatase: 79 U/L (ref 39–117)
BUN: 17 mg/dL (ref 6–23)
Chloride: 101 mEq/L (ref 96–112)
Potassium: 4 mEq/L (ref 3.5–5.1)
Total Bilirubin: 0.2 mg/dL — ABNORMAL LOW (ref 0.3–1.2)

## 2011-07-21 LAB — CBC
Hemoglobin: 7.1 g/dL — ABNORMAL LOW (ref 13.0–17.0)
RBC: 2.63 MIL/uL — ABNORMAL LOW (ref 4.22–5.81)
WBC: 5.1 10*3/uL (ref 4.0–10.5)

## 2011-07-21 LAB — APTT: aPTT: 30 seconds (ref 24–37)

## 2011-07-21 MED ORDER — FERROUS SULFATE 325 (65 FE) MG PO TABS
325.0000 mg | ORAL_TABLET | Freq: Two times a day (BID) | ORAL | Status: DC
  Administered 2011-07-21 – 2011-07-24 (×6): 325 mg via ORAL
  Filled 2011-07-21 (×8): qty 1

## 2011-07-21 MED ORDER — CITALOPRAM HYDROBROMIDE 10 MG PO TABS
10.0000 mg | ORAL_TABLET | Freq: Every day | ORAL | Status: DC
Start: 2011-07-21 — End: 2011-07-24
  Administered 2011-07-21 – 2011-07-24 (×4): 10 mg via ORAL
  Filled 2011-07-21 (×4): qty 1

## 2011-07-21 MED ORDER — PANTOPRAZOLE SODIUM 40 MG PO TBEC
40.0000 mg | DELAYED_RELEASE_TABLET | Freq: Every day | ORAL | Status: DC
  Administered 2011-07-21 – 2011-07-24 (×4): 40 mg via ORAL
  Filled 2011-07-21 (×4): qty 1

## 2011-07-21 MED ORDER — CITALOPRAM HYDROBROMIDE 10 MG PO TABS: 10.0000 mg | ORAL_TABLET | Freq: Every day | ORAL | Status: DC

## 2011-07-21 MED ORDER — SODIUM CHLORIDE 0.9 % IV SOLN
1000.0000 mL | INTRAVENOUS | Status: DC
  Administered 2011-07-21: 1000 mL via INTRAVENOUS

## 2011-07-21 MED ORDER — ASPIRIN 81 MG PO CHEW
81.0000 mg | CHEWABLE_TABLET | Freq: Every day | ORAL | Status: DC
  Administered 2011-07-21 – 2011-07-24 (×4): 81 mg via ORAL
  Filled 2011-07-21 (×4): qty 1

## 2011-07-21 MED ORDER — LORAZEPAM 1 MG PO TABS
1.0000 mg | ORAL_TABLET | Freq: Three times a day (TID) | ORAL | Status: DC
  Administered 2011-07-21 – 2011-07-23 (×5): 1 mg via ORAL
  Filled 2011-07-21 (×6): qty 1

## 2011-07-21 MED ORDER — SODIUM CHLORIDE 0.9 % IJ SOLN
3.0000 mL | Freq: Two times a day (BID) | INTRAMUSCULAR | Status: DC
Start: 2011-07-21 — End: 2011-07-24
  Administered 2011-07-21 – 2011-07-24 (×5): 3 mL via INTRAVENOUS

## 2011-07-21 MED ORDER — THIAMINE HCL 100 MG PO TABS
100.0000 mg | ORAL_TABLET | Freq: Every day | ORAL | Status: DC
  Administered 2011-07-21 – 2011-07-24 (×4): 100 mg via ORAL
  Filled 2011-07-21 (×4): qty 1

## 2011-07-21 MED ORDER — ENSURE COMPLETE PO LIQD
237.0000 mL | Freq: Three times a day (TID) | ORAL | Status: DC
  Administered 2011-07-22: 237 mL via ORAL

## 2011-07-21 MED ORDER — ONDANSETRON HCL 4 MG PO TABS: 4.0000 mg | ORAL_TABLET | Freq: Four times a day (QID) | ORAL | Status: DC | PRN

## 2011-07-21 MED ORDER — ACETAMINOPHEN 325 MG PO TABS: 650.0000 mg | ORAL_TABLET | Freq: Four times a day (QID) | ORAL | Status: DC | PRN

## 2011-07-21 MED ORDER — POTASSIUM CHLORIDE CRYS ER 20 MEQ PO TBCR
20.0000 meq | EXTENDED_RELEASE_TABLET | Freq: Every day | ORAL | Status: DC
  Administered 2011-07-21: 20 meq via ORAL
  Filled 2011-07-21 (×2): qty 1

## 2011-07-21 MED ORDER — ONDANSETRON HCL 4 MG/2ML IJ SOLN: 4.0000 mg | Freq: Four times a day (QID) | INTRAMUSCULAR | Status: DC | PRN

## 2011-07-21 MED ORDER — ADULT MULTIVITAMIN W/MINERALS CH
1.0000 | ORAL_TABLET | Freq: Every day | ORAL | Status: DC
  Administered 2011-07-21 – 2011-07-24 (×4): 1 via ORAL
  Filled 2011-07-21 (×4): qty 1

## 2011-07-21 MED ORDER — METOPROLOL SUCCINATE ER 25 MG PO TB24: 25.0000 mg | ORAL_TABLET | Freq: Every day | ORAL | Status: DC

## 2011-07-21 MED ORDER — ACETAMINOPHEN 650 MG RE SUPP: 650.0000 mg | Freq: Four times a day (QID) | RECTAL | Status: DC | PRN

## 2011-07-21 MED ORDER — THIAMINE HCL 100 MG PO TABS: 100.0000 mg | ORAL_TABLET | Freq: Every day | ORAL | Status: DC

## 2011-07-21 MED ORDER — NON FORMULARY: 40.0000 mg | Freq: Every day | Status: DC

## 2011-07-21 MED ORDER — ATORVASTATIN CALCIUM 20 MG PO TABS
20.0000 mg | ORAL_TABLET | Freq: Every day | ORAL | Status: DC
  Administered 2011-07-21 – 2011-07-24 (×4): 20 mg via ORAL
  Filled 2011-07-21 (×4): qty 1

## 2011-07-21 MED ORDER — AMIODARONE HCL 200 MG PO TABS
200.0000 mg | ORAL_TABLET | Freq: Two times a day (BID) | ORAL | Status: DC
  Administered 2011-07-21 – 2011-07-24 (×6): 200 mg via ORAL
  Filled 2011-07-21 (×7): qty 1

## 2011-07-21 MED ORDER — FUROSEMIDE 10 MG/ML IJ SOLN
20.0000 mg | Freq: Once | INTRAMUSCULAR | Status: AC
  Administered 2011-07-21: 20 mg via INTRAVENOUS
  Filled 2011-07-21: qty 2

## 2011-07-21 MED ORDER — METOPROLOL SUCCINATE ER 25 MG PO TB24
25.0000 mg | ORAL_TABLET | Freq: Every day | ORAL | Status: DC
  Administered 2011-07-21 – 2011-07-24 (×4): 25 mg via ORAL
  Filled 2011-07-21 (×4): qty 1

## 2011-07-21 MED ORDER — IRBESARTAN 75 MG PO TABS
37.5000 mg | ORAL_TABLET | Freq: Every day | ORAL | Status: DC
  Administered 2011-07-21: 37.5 mg via ORAL
  Administered 2011-07-22 – 2011-07-23 (×2): via ORAL
  Administered 2011-07-24: 37.5 mg via ORAL
  Filled 2011-07-21 (×4): qty 0.5

## 2011-07-21 MED ORDER — ZOLPIDEM TARTRATE 5 MG PO TABS: 5.0000 mg | ORAL_TABLET | Freq: Every evening | ORAL | Status: DC | PRN

## 2011-07-21 MED ORDER — SODIUM CHLORIDE 0.9 % IV SOLN
INTRAVENOUS | Status: DC
  Administered 2011-07-21: 19:00:00 via INTRAVENOUS

## 2011-07-21 NOTE — Progress Notes (Signed)
Subjective:    Patient ID: [name], male    DOB: 1927-01-07, 78 y.o.   MRN: [id number]  HPI Pt here for routine f/u, incidentally has Hgb drawn yesterday as regular f/u for hematuria per urology at 7.8; unfortunately now with 2 days unsually worsening general weakness, "my legs get shaky and just give out" and fall x 2 - first yesterday with abrasion to right elbow, then again coming to the office in the parking lot today with new skin tear/abrasion to the left elbow (now bandaged without overt oozing at this time) and no signficant c/o elbow pain otherwise;  Did see Dr Antoine Poche with change of diovan HCT to diovan 40 mg recently, but wife (very supportive) has actually had to give half of his diovan 40 mg and toprol xl 25 mg due to the worsening weakness and falls;  Of note he cont's to have persistent gross hematuria, last hosp'd late may with transfusion despite current hyperbaric O2 tx's per Dr Patsi Sears (now completed about 7 treatments of planned 20 - next appt today at 230pm);  Pt appears markedly weak, pale somewhat stoic it seems and denies worsening chest pain, increased sob or doe, wheezing, orthopnea, PND, increased LE swelling, palpitations, or syncope.  Pt denies new neurological symptoms such as new headache, or facial or extremity weakness or numbness Denies worsening depressive symptoms, suicidal ideation, or panic, though has ongoing anxiety, and wife asks to re-start the celexa 10 mg. Denies fever or overall worsening pain.  Pt is very reluctant to go to ER as he worries he may need hospn Past Medical History  Diagnosis Date  . ADENOCARCINOMA, PROSTATE   . ALLERGIC RHINITIS   . B12 DEFICIENCY   . BENIGN PROSTATIC HYPERTROPHY   . CAROTID ARTERY DISEASE   . CEREBROVASCULAR ACCIDENT, HX OF   . CORONARY ARTERY DISEASE   . DIVERTICULOSIS, COLON   . GERD   . HEMORRHOIDS   . HIATAL HERNIA   . HYPERLIPIDEMIA   . HYPERTENSION   . OSTEOPENIA   . PEPTIC ULCER DISEASE   .  PERIPHERAL VASCULAR DISEASE   . Personal history of alcoholism   . PROSTATE CANCER, HX OF   . PUD, HX OF   . TRANSIENT ISCHEMIC ATTACK   . Shingles   . Anemia   . AS (aortic stenosis)   . S/P AVR (aortic valve replacement) 09/23/2010  . Complication of anesthesia 09/23/2010    very confused after waking up. Stopped drinking 40 days before this surgery.  . Transfusion reaction, chill fever type 04/28/2011    Fever, rigors, tachycardia,tachypnea but no evidence of hemolysis  . Hemorrhagic cystitis 04/28/2011    Due to previous radiation for PSA recurrence of prostate cancer   Past Surgical History  Procedure Date  . Popliteal synovial cyst excision   . Right carotid endarterectomy 1999    Left carotid total chronic occlusion  . Prostate surgery   . Cardic stent 2004  . Aortic valve replacement 09/23/2010    #16mm [facility] Ease pericardial tissue valve  . Coronary artery bypass graft 09/23/2010    CABG x1 using LIMA to LAD  . Cystoscopy with biopsy 02/15/2011    Procedure: CYSTOSCOPY WITH BIOPSY;  Surgeon: Anner Crete, MD;  Location: [facility];  Service: Urology;  Laterality: N/A;  Cystoscopy, biopsy and fulguration of the bladder    reports that he quit smoking about 53 years ago. He has never used smokeless tobacco. He reports that he does not  drink alcohol or use illicit drugs. family history includes Colon cancer (age of onset:62) in his mother; Coronary artery disease in his sister; Diabetes in his sister; Hypertension in his sister; Multiple sclerosis in his sister; and Stroke in his brother. Allergies  Allergen Reactions  . Tolterodine Tartrate    Current Outpatient Prescriptions on File Prior to Visit  Medication Sig Dispense Refill  . amiodarone (PACERONE) 200 MG tablet Take 200 mg by mouth 2 (two) times daily.      Marland Kitchen aspirin 81 MG tablet Take 81 mg by mouth daily.      Marland Kitchen atorvastatin (LIPITOR) 20 MG tablet Take 1 tablet (20 mg total) by mouth daily.  90 tablet  2  .  feeding supplement (ENSURE COMPLETE) LIQD Take 237 mLs by mouth 3 (three) times daily between meals.  90 Bottle  0  . ferrous sulfate (FERROUSUL) 325 (65 FE) MG tablet Take 1 tablet (325 mg total) by mouth 2 (two) times daily.  60 tablet  11  . LORazepam (ATIVAN) 1 MG tablet Take 1 mg by mouth every 8 (eight) hours.      . Multiple Vitamin (MULITIVITAMIN WITH MINERALS) TABS Take 1 tablet by mouth daily.      Marland Kitchen omeprazole (PRILOSEC) 40 MG capsule Take 40 mg by mouth daily.        . potassium chloride SA (K-DUR,KLOR-CON) 20 MEQ tablet Take 1 tablet (20 mEq total) by mouth daily.  90 tablet  3  . valsartan (DIOVAN) 40 MG tablet Take 1 tablet (40 mg total) by mouth daily.  90 tablet  3  . Vitamins A & D (VITAMIN A & D) cream Apply 1 application topically daily.      Marland Kitchen DISCONTD: metoprolol succinate (TOPROL-XL) 25 MG 24 hr tablet Take 1 tablet (25 mg total) by mouth daily.  90 tablet  2   Review of Systems Constitutional: Negative for diaphoresis  HENT: Negative for  tinnitus.   Respiratory: Negative for choking and stridor.   Gastrointestinal: Negative for vomiting and blood in stool.  Genitourinary: Negative for decreased urine volume.  Musculoskeletal: Negative for acute joint swelling other than elbows as above Skin: Negative for new wounds except for elbows Neurological: Negative for tremors and numbness.  Psychiatric/Behavioral: Negative for decreased concentration. The patient is not hyperactive.      Objective:   Physical Exam BP 98/52  Pulse 50  Temp 96.4 F (35.8 C) (Oral)  SpO2 98% Physical Exam  VS noted, pale, weak, hard to stand - needs assist, shaky Constitutional: Pt appears well-developed and well-nourished.  HENT: Head: Normocephalic.  Right Ear: External ear normal.  Left Ear: External ear normal.  Eyes: Conjunctivae and EOM are normal. Pupils are equal, round, and reactive to light.  Neck: Normal range of motion. Neck supple.  Cardiovascular: Normal rate and regular  rhythm.   Pulmonary/Chest: Effort normal and breath sounds without wheezing/ rales Abd:  Soft, NT, non-distended, + BS Neurological: Pt is alert. Skin: Skin is warm. No erythema.  Bandages left to elbows as he is going to ER Psychiatric: 1+ nervous    Assessment & Plan:

## 2011-07-21 NOTE — Assessment & Plan Note (Signed)
Pt has had occasional fall since last hospn may 2013, but the two yesterday and today with skin injuries now to both elbows that will require further attention, and very likely due to general weakness related to the anemia, now more symptomatic, and too high risk to go home

## 2011-07-21 NOTE — Care Management Note (Unsigned)
    Page 1 of 1   07/23/2011     3:27:02 PM   CARE MANAGEMENT NOTE 07/23/2011  Patient:  [name], [name]   Account Number:  [id number]  Date Initiated:  07/21/2011  Documentation initiated by:  Lanier Clam  Subjective/Objective Assessment:   ADMITTED W/ANEMIA.YQ:[id number] CA     Action/Plan:   FROM HOME W/SPOUSE.ACTIVE W/[facility]-HHRN   Anticipated DC Date:  07/24/2011   Anticipated DC Plan:  HOME W HOME HEALTH SERVICES      DC Planning Services  CM consult      [facility] Choice  Resumption Of Svcs/PTA Provider   Choice offered to / List presented to:             Status of service:  In process, will continue to follow Medicare Important Message given?   (If response is "NO", the following Medicare IM given date fields will be blank) Date Medicare IM given:   Date Additional Medicare IM given:    Discharge Disposition:    Per UR Regulation:  Reviewed for med. necessity/level of care/duration of stay  If discussed at Long Length of Stay Meetings, dates discussed:    Comments:  07/23/11 Linkon Siverson RN,BSN NCM [phone] PT-SNF.[facility] STILL FOLLOWING FOR HH.  07/21/11 Tyjuan Demetro RN,BSN NCM [phone] [facility] FOLLOWING FOR RESUMPTION OF HHRN.

## 2011-07-21 NOTE — Progress Notes (Signed)
Md notified about patients bp 180/90. Patient received one unit of blood. New MD orders. Will continue to monitor patient. Setzer, Don Broach

## 2011-07-21 NOTE — ED Notes (Signed)
Blood bank called, blood available is needed.

## 2011-07-21 NOTE — Assessment & Plan Note (Signed)
With lower blood pressure recent likely related to ongoing blood loss, possibly wt loss as well;  For now to hold the diovan 40 mg completely; and I have asked wife to bring him back in 1 wk

## 2011-07-21 NOTE — Assessment & Plan Note (Signed)
By report per staff, now bandaged, will need further assessment in ER or while hospd

## 2011-07-21 NOTE — Patient Instructions (Addendum)
Continue all other medications as listed today, except HOLD on taking the DIOVAN 40 mg due to low blood pressure Your refills were done as requested Your Hgb was 7.8 yesterday and maybe slightly worse today;  Due to the weakness and falls you should go to the ER for further evaluation and treatment of "symptomatic anemia."

## 2011-07-21 NOTE — ED Notes (Signed)
Blood started at  1500--50cc/hr x 12.5cc

## 2011-07-21 NOTE — Assessment & Plan Note (Addendum)
Now worsening, has required 2 episode transfusions earlier this yr;  Now should likely receive at least 2 units, wife and pt urged to go to [facility] ER and we have called ahead to triage nurse to notify

## 2011-07-21 NOTE — ED Notes (Addendum)
While MD Lynelle Doctor at bedside, pt. Stated had leaking at foley site around tip of penis. -- MD Lynelle Doctor stated to get larger size (26) foley if able. Pt. currently has 24 french in. * RN Sheppard Penton also present during conversation

## 2011-07-21 NOTE — H&P (Signed)
History and Physical Examination  Date: 07/21/2011  Patient name: [name] Medical record number: [id number] Date of birth: May 30, 1926 Age: 78 y.o. Gender: male PCP: Oliver Barre, MD  Chief Complaint:  Chief Complaint  Patient presents with  . Anemia     History of Present Illness: [name] is an 78 y.o. male who was sent to the emergency department by his primary care physician today after having 2 episodes of near-syncope.  The patient reports that he has been severely fatigued in the past several days.  He suffers from gross hematuria related to hemorrhagic cystitis from radiation from prostate cancer.  The patient is currently undergoing therapy with hyperbaric oxygen treatments.  He has received approximately 8 treatments.  The patient is expected to complete 40 treatments.  The patient has large amounts of gross hematuria on a daily basis requiring intermittent blood transfusions.  The patient reports no chest pain or shortness of breath.  He has been severely fatigued recently.  Also of note, his wife reports that she's had been cutting his blood pressure medications and have because of concerns about his weakness.  She reports that he has frequent falls at home.   The patient's hemoglobin was 9.1 in May of this year and now has dropped to 7.1.  The patient was sent to the GERD for evaluation for possible blood transfusion.  The ER doctors evaluated the patient and found the patient to be slightly hyponatremic and anemic.  He was very pale.  The patient was started on a blood transfusion and hospitalization was requested for observation.  The patient was also found to be bradycardic with a pulse for approximately 42.  The patient does take beta blockers.  Past Medical History Past Medical History  Diagnosis Date  . ADENOCARCINOMA, PROSTATE   . ALLERGIC RHINITIS   . B12 DEFICIENCY   . BENIGN PROSTATIC HYPERTROPHY   . CAROTID ARTERY DISEASE   . CEREBROVASCULAR ACCIDENT, HX OF     . CORONARY ARTERY DISEASE   . DIVERTICULOSIS, COLON   . GERD   . HEMORRHOIDS   . HIATAL HERNIA   . HYPERLIPIDEMIA   . HYPERTENSION   . OSTEOPENIA   . PEPTIC ULCER DISEASE   . PERIPHERAL VASCULAR DISEASE   . Personal history of alcoholism   . PROSTATE CANCER, HX OF   . PUD, HX OF   . TRANSIENT ISCHEMIC ATTACK   . Shingles   . Anemia   . AS (aortic stenosis)   . S/P AVR (aortic valve replacement) 09/23/2010  . Complication of anesthesia 09/23/2010    very confused after waking up. Stopped drinking 40 days before this surgery.  . Transfusion reaction, chill fever type 04/28/2011    Fever, rigors, tachycardia,tachypnea but no evidence of hemolysis  . Hemorrhagic cystitis 04/28/2011    Due to previous radiation for PSA recurrence of prostate cancer  . Blood transfusion   . Gross hematuria     Past Surgical History Past Surgical History  Procedure Date  . Popliteal synovial cyst excision   . Right carotid endarterectomy 1999    Left carotid total chronic occlusion  . Prostate surgery   . Cardic stent 2004  . Aortic valve replacement 09/23/2010    #60mm [facility] Ease pericardial tissue valve  . Coronary artery bypass graft 09/23/2010    CABG x1 using LIMA to LAD  . Cystoscopy with biopsy 02/15/2011    Procedure: CYSTOSCOPY WITH BIOPSY;  Surgeon: Anner Crete, MD;  Location: [facility];  Service: Urology;  Laterality: N/A;  Cystoscopy, biopsy and fulguration of the bladder    Home Meds: Prior to Admission medications   Medication Sig Start Date End Date Taking? Authorizing Provider  amiodarone (PACERONE) 200 MG tablet Take 200 mg by mouth 2 (two) times daily. 05/28/11 05/27/12 Yes Rollene Rotunda, MD  aspirin 81 MG tablet Take 81 mg by mouth daily.   Yes Historical Provider, MD  atorvastatin (LIPITOR) 20 MG tablet Take 1 tablet (20 mg total) by mouth daily. 02/18/11  Yes Corwin Levins, MD  citalopram (CELEXA) 10 MG tablet Take 1 tablet (10 mg total) by mouth daily. 07/21/11 07/20/12 Yes  Corwin Levins, MD  feeding supplement (ENSURE COMPLETE) LIQD Take 237 mLs by mouth 3 (three) times daily between meals. 06/11/11  Yes Osvaldo Shipper, MD  ferrous sulfate (FERROUSUL) 325 (65 FE) MG tablet Take 1 tablet (325 mg total) by mouth 2 (two) times daily. 05/11/11 05/10/12 Yes Alma Concepcion Elk, MD  LORazepam (ATIVAN) 1 MG tablet Take 1 mg by mouth every 8 (eight) hours.   Yes Historical Provider, MD  metoprolol succinate (TOPROL-XL) 25 MG 24 hr tablet Take 1 tablet (25 mg total) by mouth daily. 07/21/11  Yes Corwin Levins, MD  Multiple Vitamin (MULITIVITAMIN WITH MINERALS) TABS Take 1 tablet by mouth daily.   Yes Historical Provider, MD  omeprazole (PRILOSEC) 40 MG capsule Take 40 mg by mouth daily.     Yes Historical Provider, MD  potassium chloride SA (K-DUR,KLOR-CON) 20 MEQ tablet Take 1 tablet (20 mEq total) by mouth daily. 06/18/11 06/17/12 Yes Rollene Rotunda, MD  thiamine 100 MG tablet Take 1 tablet (100 mg total) by mouth daily. 07/21/11 07/20/12 Yes Corwin Levins, MD  valsartan (DIOVAN) 40 MG tablet Take 1 tablet (40 mg total) by mouth daily. 06/18/11  Yes Rollene Rotunda, MD  Vitamins A & D (VITAMIN A & D) cream Apply 1 application topically daily.   Yes Historical Provider, MD    Allergies: Tolterodine tartrate  Social History:  History   Social History  . Marital Status: Married    Spouse Name: N/A    Number of Children: N/A  . Years of Education: N/A   Occupational History  . Not on file.   Social History Main Topics  . Smoking status: Former Smoker    Quit date: 02/14/1958  . Smokeless tobacco: Never Used  . Alcohol Use: No     last drink april 2013  . Drug Use: No  . Sexually Active: No   Other Topics Concern  . Not on file   Social History Narrative  . No narrative on file   Family History:  Family History  Problem Relation Age of Onset  . Colon cancer Mother 34  . Diabetes Sister   . Hypertension Sister   . Coronary artery disease Sister   . Multiple sclerosis  Sister   . Stroke Brother     Review of Systems: Pertinent items are noted in HPI. All other systems reviewed and reported as negative.   Physical Exam: Blood pressure 165/69, pulse 55, temperature 97.2 F (36.2 C), temperature source Oral, resp. rate 14, SpO2 100.00%. General appearance: alert, cooperative, no distress and pale Head: Normocephalic, without obvious abnormality, atraumatic Eyes: conjunctivae/corneas clear. PERRL, EOM's intact. Fundi benign. Nose: Nares normal. Septum midline. Mucosa normal. No drainage or sinus tenderness., no discharge Throat: lips, mucosa, and tongue normal; teeth and gums normal Neck: no adenopathy, no carotid bruit,  no JVD, supple, symmetrical, trachea midline and thyroid not enlarged, symmetric, no tenderness/mass/nodules Lungs: clear to auscultation bilaterally and normal percussion bilaterally Chest wall: no tenderness Heart: S1, S2 normal Abdomen: soft, non-tender; bowel sounds normal; no masses,  no organomegaly Extremities: extremities normal, atraumatic, no cyanosis or edema Skin: Skin color, texture, turgor normal. No rashes or lesions Neurologic: Grossly normal Foley catheter coming from bladder with gross red hematuria noted in large amounts  Lab  And Imaging results:  Results for orders placed during the hospital encounter of 07/21/11 (from the past 24 hour(s))  CBC     Status: Abnormal   Collection Time   07/21/11 12:35 PM      Component Value Range   WBC 5.1  4.0 - 10.5 K/uL   RBC 2.63 (*) 4.22 - 5.81 MIL/uL   Hemoglobin 7.1 (*) 13.0 - 17.0 g/dL   HCT 16.1 (*) 09.6 - 04.5 %   MCV 85.2  78.0 - 100.0 fL   MCH 27.0  26.0 - 34.0 pg   MCHC 31.7  30.0 - 36.0 g/dL   RDW 40.9 (*) 81.1 - 91.4 %   Platelets 271  150 - 400 K/uL  DIFFERENTIAL     Status: Abnormal   Collection Time   07/21/11 12:35 PM      Component Value Range   Neutrophils Relative 68  43 - 77 %   Neutro Abs 3.5  1.7 - 7.7 K/uL   Lymphocytes Relative 16  12 - 46 %    Lymphs Abs 0.8  0.7 - 4.0 K/uL   Monocytes Relative 14 (*) 3 - 12 %   Monocytes Absolute 0.7  0.1 - 1.0 K/uL   Eosinophils Relative 1  0 - 5 %   Eosinophils Absolute 0.1  0.0 - 0.7 K/uL   Basophils Relative 1  0 - 1 %   Basophils Absolute 0.1  0.0 - 0.1 K/uL  COMPREHENSIVE METABOLIC PANEL     Status: Abnormal   Collection Time   07/21/11 12:35 PM      Component Value Range   Sodium 132 (*) 135 - 145 mEq/L   Potassium 4.0  3.5 - 5.1 mEq/L   Chloride 101  96 - 112 mEq/L   CO2 23  19 - 32 mEq/L   Glucose, Bld 88  70 - 99 mg/dL   BUN 17  6 - 23 mg/dL   Creatinine, Ser 7.82  0.50 - 1.35 mg/dL   Calcium 8.8  8.4 - 95.6 mg/dL   Total Protein 6.4  6.0 - 8.3 g/dL   Albumin 2.8 (*) 3.5 - 5.2 g/dL   AST 21  0 - 37 U/L   ALT 12  0 - 53 U/L   Alkaline Phosphatase 79  39 - 117 U/L   Total Bilirubin 0.2 (*) 0.3 - 1.2 mg/dL   GFR calc non Af Amer 76 (*) >90 mL/min   GFR calc Af Amer 88 (*) >90 mL/min  APTT     Status: Normal   Collection Time   07/21/11 12:35 PM      Component Value Range   aPTT 30  24 - 37 seconds  PROTIME-INR     Status: Normal   Collection Time   07/21/11 12:35 PM      Component Value Range   Prothrombin Time 13.7  11.6 - 15.2 seconds   INR 1.03  0.00 - 1.49  TYPE AND SCREEN     Status: Normal (Preliminary result)   Collection  Time   07/21/11 12:35 PM      Component Value Range   ABO/RH(D) O NEG     Antibody Screen NEG     Sample Expiration 07/24/2011     Unit Number [id number]     Blood Component Type RED CELLS,LR     Unit division 00     Status of Unit ISSUED     Transfusion Status OK TO TRANSFUSE     Crossmatch Result Compatible     Unit Number [id number]     Blood Component Type RED CELLS,LR     Unit division 00     Status of Unit ALLOCATED     Transfusion Status OK TO TRANSFUSE     Crossmatch Result Compatible    PREPARE RBC (CROSSMATCH)     Status: Normal   Collection Time   07/21/11 12:35 PM      Component Value Range   Order Confirmation ORDER PROCESSED BY  BLOOD BANK    RETICULOCYTES     Status: Abnormal   Collection Time   07/21/11 12:35 PM      Component Value Range   Retic Ct Pct 4.0 (*) 0.4 - 3.1 %   RBC. 2.58 (*) 4.22 - 5.81 MIL/uL   Retic Count, Manual 103.2  19.0 - 186.0 K/uL  URINALYSIS, ROUTINE W REFLEX MICROSCOPIC     Status: Abnormal   Collection Time   07/21/11  2:46 PM      Component Value Range   Color, Urine RED (*) YELLOW   APPearance TURBID (*) CLEAR   Specific Gravity, Urine 1.030  1.005 - 1.030   pH 6.5  5.0 - 8.0   Glucose, UA NEGATIVE  NEGATIVE mg/dL   Hgb urine dipstick LARGE (*) NEGATIVE   Bilirubin Urine SMALL (*) NEGATIVE   Ketones, ur NEGATIVE  NEGATIVE mg/dL   Protein, ur >086 (*) NEGATIVE mg/dL   Urobilinogen, UA 0.2  0.0 - 1.0 mg/dL   Nitrite NEGATIVE  NEGATIVE   Leukocytes, UA MODERATE (*) NEGATIVE  URINE MICROSCOPIC-ADD ON     Status: Abnormal   Collection Time   07/21/11  2:46 PM      Component Value Range   Squamous Epithelial / LPF FEW (*) RARE   WBC, UA 11-20  <3 WBC/hpf   RBC / HPF TOO NUMEROUS TO COUNT  <3 RBC/hpf   Bacteria, UA FEW (*) RARE     Impression   *Acute blood loss anemia - symptomatic  CORONARY ARTERY DISEASE  GERD  CEREBROVASCULAR ACCIDENT, HX OF  S/P AVR (aortic valve replacement)  Hypertension  Hemorrhagic cystitis  Recurrent falls  Gross hematuria  bradycardia  hyponatremia  anxiety/depression  Plan  Admit for observation, transfuse 2 units packed red blood cells, PT consult, telemetry, monitor blood pressure and pulse closely.  Plan discharge if stable tomorrow. Checking anemia panel.  Check cardiac enzymes.  Please see orders.    Standley Dakins MD [facility] [place], [place] [phone] 07/21/2011, 3:33 PM

## 2011-07-21 NOTE — ED Provider Notes (Signed)
History     CSN: [id number]  Arrival date & time 07/21/11  1148   First MD Initiated Contact with Patient 07/21/11 1153      Chief Complaint  Patient presents with  . Anemia     HPI Patient presents to the emergency room for worsening anemia and weakness. For the last 2 days he has become progressively weaker and has had falls associated with that. Patient states he would just feel weak in the knees and then fell to the ground.  He has not loss consciousness. He has not had any injury to his head. He denies any chest pain or abdominal pain. Patient has history of hematuria that has been ongoing since April. He is an indwelling Foley catheter and has been followed by urology. He has noticed that he has been leaking around the catheter. He went to see his doctor today for followup. Lab results that were recently drawn showed he had a hemoglobin of 7.8. Patient was sent to the hospital for blood transfusions and further treatment. Patient denies any blood in his stool. Past Medical History  Diagnosis Date  . ADENOCARCINOMA, PROSTATE   . ALLERGIC RHINITIS   . B12 DEFICIENCY   . BENIGN PROSTATIC HYPERTROPHY   . CAROTID ARTERY DISEASE   . CEREBROVASCULAR ACCIDENT, HX OF   . CORONARY ARTERY DISEASE   . DIVERTICULOSIS, COLON   . GERD   . HEMORRHOIDS   . HIATAL HERNIA   . HYPERLIPIDEMIA   . HYPERTENSION   . OSTEOPENIA   . PEPTIC ULCER DISEASE   . PERIPHERAL VASCULAR DISEASE   . Personal history of alcoholism   . PROSTATE CANCER, HX OF   . PUD, HX OF   . TRANSIENT ISCHEMIC ATTACK   . Shingles   . Anemia   . AS (aortic stenosis)   . S/P AVR (aortic valve replacement) 09/23/2010  . Complication of anesthesia 09/23/2010    very confused after waking up. Stopped drinking 40 days before this surgery.  . Transfusion reaction, chill fever type 04/28/2011    Fever, rigors, tachycardia,tachypnea but no evidence of hemolysis  . Hemorrhagic cystitis 04/28/2011    Due to previous radiation for PSA  recurrence of prostate cancer  . Blood transfusion     Past Surgical History  Procedure Date  . Popliteal synovial cyst excision   . Right carotid endarterectomy 1999    Left carotid total chronic occlusion  . Prostate surgery   . Cardic stent 2004  . Aortic valve replacement 09/23/2010    #41mm [facility] Ease pericardial tissue valve  . Coronary artery bypass graft 09/23/2010    CABG x1 using LIMA to LAD  . Cystoscopy with biopsy 02/15/2011    Procedure: CYSTOSCOPY WITH BIOPSY;  Surgeon: Anner Crete, MD;  Location: [facility];  Service: Urology;  Laterality: N/A;  Cystoscopy, biopsy and fulguration of the bladder    Family History  Problem Relation Age of Onset  . Colon cancer Mother 40  . Diabetes Sister   . Hypertension Sister   . Coronary artery disease Sister   . Multiple sclerosis Sister   . Stroke Brother     History  Substance Use Topics  . Smoking status: Former Smoker    Quit date: 02/14/1958  . Smokeless tobacco: Never Used  . Alcohol Use: No     last drink april 2013      Review of Systems  All other systems reviewed and are negative.    Allergies  Tolterodine tartrate  Home Medications   Current Outpatient Rx  Name Route Sig Dispense Refill  . AMIODARONE HCL 200 MG PO TABS Oral Take 200 mg by mouth 2 (two) times daily.    . ASPIRIN 81 MG PO TABS Oral Take 81 mg by mouth daily.    . ATORVASTATIN CALCIUM 20 MG PO TABS Oral Take 1 tablet (20 mg total) by mouth daily. 90 tablet 2  . CITALOPRAM HYDROBROMIDE 10 MG PO TABS Oral Take 1 tablet (10 mg total) by mouth daily. 90 tablet 3  . ENSURE COMPLETE PO LIQD Oral Take 237 mLs by mouth 3 (three) times daily between meals.    Di Kindle SULFATE 325 (65 FE) MG PO TABS Oral Take 1 tablet (325 mg total) by mouth 2 (two) times daily. 60 tablet 11  . LORAZEPAM 1 MG PO TABS Oral Take 1 mg by mouth every 8 (eight) hours.    Marland Kitchen METOPROLOL SUCCINATE ER 25 MG PO TB24 Oral Take 1 tablet (25 mg total) by mouth daily.  90 tablet 3  . ADULT MULTIVITAMIN W/MINERALS CH Oral Take 1 tablet by mouth daily.    Marland Kitchen OMEPRAZOLE 40 MG PO CPDR Oral Take 40 mg by mouth daily.      Marland Kitchen POTASSIUM CHLORIDE CRYS ER 20 MEQ PO TBCR Oral Take 1 tablet (20 mEq total) by mouth daily. 90 tablet 3  . THIAMINE HCL 100 MG PO TABS Oral Take 1 tablet (100 mg total) by mouth daily. 90 tablet 3  . VALSARTAN 40 MG PO TABS Oral Take 1 tablet (40 mg total) by mouth daily. 90 tablet 3  . SWEEN EX CREA Topical Apply 1 application topically daily.      BP 111/47  Pulse 47  Temp 97.6 F (36.4 C) (Oral)  Resp 15  SpO2 100%  Physical Exam  Nursing note and vitals reviewed. Constitutional: No distress.  HENT:  Head: Normocephalic and atraumatic.  Right Ear: External ear normal.  Left Ear: External ear normal.       Conjunctiva pale  Eyes: EOM are normal. Right eye exhibits no discharge. Left eye exhibits no discharge. No scleral icterus.  Neck: Neck supple. No tracheal deviation present.  Cardiovascular: Normal rate, regular rhythm and intact distal pulses.   Murmur heard.  Systolic murmur is present  Pulmonary/Chest: Effort normal and breath sounds normal. No stridor. No respiratory distress. He has no wheezes. He has no rales.  Abdominal: Soft. Bowel sounds are normal. He exhibits no distension. There is no tenderness. There is no rebound and no guarding.  Genitourinary:       Foley catheter in place with bloody urine  Musculoskeletal: He exhibits no edema and no tenderness.  Neurological: He is alert. He has normal strength. No sensory deficit. Cranial nerve deficit:  no gross defecits noted. He exhibits normal muscle tone. He displays no seizure activity. Coordination normal.  Skin: Skin is warm and dry. No rash noted. He is not diaphoretic. There is pallor.  Psychiatric: He has a normal mood and affect.    ED Course  Procedures (including critical care time)  Rate: 48  Rhythm: sinus bradycardia  QRS Axis: normal  Intervals:  normal  ST/T Wave abnormalities: Nonspecific T wave changes Conduction Disutrbances:none  Narrative Interpretation: Abnormal EKG  Old EKG Reviewed: Rate slower since last tracing  Labs Reviewed  CBC - Abnormal; Notable for the following:    RBC 2.63 (*)     Hemoglobin 7.1 (*)  HCT 22.4 (*)     RDW 17.2 (*)     All other components within normal limits  DIFFERENTIAL - Abnormal; Notable for the following:    Monocytes Relative 14 (*)     All other components within normal limits  COMPREHENSIVE METABOLIC PANEL - Abnormal; Notable for the following:    Sodium 132 (*)     Albumin 2.8 (*)     Total Bilirubin 0.2 (*)     GFR calc non Af Amer 76 (*)     GFR calc Af Amer 88 (*)     All other components within normal limits  APTT  PROTIME-INR  TYPE AND SCREEN  PREPARE RBC (CROSSMATCH)  URINALYSIS, ROUTINE W REFLEX MICROSCOPIC  VITAMIN B12  FOLATE  IRON AND TIBC  FERRITIN  RETICULOCYTES   Dg Chest Portable 1 View  07/21/2011  *RADIOLOGY REPORT*  Clinical Data: Weakness, anemia, history heart surgery, hypertension, prostate cancer, smoking  PORTABLE CHEST - 1 VIEW  Comparison: Portable exam 1226 hours compared to 07/06/2011  Findings: Upper normal-sized cardiac silhouette post median sternotomy and AVR. Mediastinal contours and pulmonary vascularity normal. Atherosclerotic calcification aorta. Lungs questionably emphysematous but clear. No pleural effusion or pneumothorax. Osseous demineralization with old right rib fractures and a bifid anterior right sixth rib.  IMPRESSION: Post AVR. Question emphysematous changes. No acute abnormalities.  Original Report Authenticated By: Lollie Marrow, M.D.     1. Anemia   2. Hematuria   3. Bradycardia       MDM  Patient has worsening anemia associated with chronic hematuria. Foley catheter WAS replaced with a larger caliber fully to system with the urinary leakage. Pt also bradycardic possibly related to his metoprolol which may be a  contributory factor.  Patient will be admitted to the hospital for blood transfusions and monitoring.        Celene Kras, MD 07/21/11 [id number]

## 2011-07-21 NOTE — ED Notes (Signed)
Sent from Dr. Fayrene Fearing John's office. Reported Hgb 7.8. Pt has chronic Foley cath due to bleeding from bladder 2nd to radiation tx. H/O prostates Ca in 2001. Came back in 2006 at which time he received radiation.

## 2011-07-22 DIAGNOSIS — E039 Hypothyroidism, unspecified: Secondary | ICD-10-CM | POA: Diagnosis present

## 2011-07-22 DIAGNOSIS — R319 Hematuria, unspecified: Secondary | ICD-10-CM

## 2011-07-22 DIAGNOSIS — Z8619 Personal history of other infectious and parasitic diseases: Secondary | ICD-10-CM

## 2011-07-22 LAB — BASIC METABOLIC PANEL
Calcium: 8.3 mg/dL — ABNORMAL LOW (ref 8.4–10.5)
Creatinine, Ser: 0.85 mg/dL (ref 0.50–1.35)
GFR calc Af Amer: >90 mL/min (ref >90)

## 2011-07-22 LAB — CARDIAC PANEL(CRET KIN+CKTOT+MB+TROPI)
Relative Index: INVALID (ref 0.0–2.5)
Relative Index: INVALID (ref 0.0–2.5)
Total CK: 29 U/L (ref 7–232)
Total CK: 22 U/L (ref 7–232)
Troponin I: <0.3 ng/mL (ref <0.30)

## 2011-07-22 LAB — TYPE AND SCREEN
Antibody Screen: NEGATIVE
Unit division: 0

## 2011-07-22 LAB — IRON AND TIBC
Saturation Ratios: 18 % — ABNORMAL LOW (ref 20–55)
UIBC: 208 ug/dL (ref 125–400)

## 2011-07-22 LAB — CBC
MCH: 28.2 pg (ref 26.0–34.0)
MCV: 84.7 fL (ref 78.0–100.0)
Platelets: 266 10*3/uL (ref 150–400)
RDW: 16 % — ABNORMAL HIGH (ref 11.5–15.5)
WBC: 25.4 10*3/uL — ABNORMAL HIGH (ref 4.0–10.5)

## 2011-07-22 MED ORDER — LEVOFLOXACIN IN D5W 500 MG/100ML IV SOLN
500.0000 mg | INTRAVENOUS | Status: DC
  Filled 2011-07-22: qty 100

## 2011-07-22 MED ORDER — CIPROFLOXACIN IN D5W 400 MG/200ML IV SOLN
400.0000 mg | Freq: Two times a day (BID) | INTRAVENOUS | Status: DC
  Administered 2011-07-22: 400 mg via INTRAVENOUS
  Filled 2011-07-22 (×2): qty 200

## 2011-07-22 MED ORDER — ENSURE PUDDING PO PUDG
1.0000 | Freq: Two times a day (BID) | ORAL | Status: DC
  Administered 2011-07-23 (×2): 1 via ORAL
  Filled 2011-07-22 (×5): qty 1

## 2011-07-22 MED ORDER — POTASSIUM CHLORIDE CRYS ER 20 MEQ PO TBCR
30.0000 meq | EXTENDED_RELEASE_TABLET | Freq: Two times a day (BID) | ORAL | Status: DC
  Administered 2011-07-22 – 2011-07-24 (×4): 30 meq via ORAL
  Filled 2011-07-22 (×6): qty 1

## 2011-07-22 MED ORDER — CIPROFLOXACIN IN D5W 400 MG/200ML IV SOLN
400.0000 mg | Freq: Two times a day (BID) | INTRAVENOUS | Status: DC
  Administered 2011-07-22 – 2011-07-24 (×4): 400 mg via INTRAVENOUS
  Filled 2011-07-22 (×5): qty 200

## 2011-07-22 MED ORDER — LEVOTHYROXINE SODIUM 25 MCG PO TABS
25.0000 ug | ORAL_TABLET | Freq: Every day | ORAL | Status: DC
  Administered 2011-07-23 – 2011-07-24 (×2): 25 ug via ORAL
  Filled 2011-07-22 (×3): qty 1

## 2011-07-22 NOTE — Evaluation (Signed)
Physical Therapy Evaluation Patient Details Name: [name] MRN: [id number] DOB: 06/29/1926 Today's Date: 07/22/2011 Time: 7829-5621 PT Time Calculation (min): 19 min  PT Assessment / Plan / Recommendation Clinical Impression  Pt presents with acute blood loss anemia from hematuria related to prostate cancer.  Presents today with decreased strength, mobility and increased fatigue.  Per RN, pt received blood yesterday, however pt continues to c/o dizziness when asked while in upright position.  BP remained stable throughout session.  Pt will benefit from skilled PT in acute venue to address deficits.  PT recommends SNF for follow up therapy at D/C to increase pt safety and decrease burden of care.  Unsure of amount of assist at home, however pt requiring +2 assist at this time for safety.     PT Assessment  Patient needs continued PT services    Follow Up Recommendations  Skilled nursing facility    Barriers to Discharge   unsure of caregiver support.     Equipment Recommendations  Defer to next venue    Recommendations for Other Services OT consult   Frequency Min 3X/week    Precautions / Restrictions Precautions Precautions: Fall Restrictions Weight Bearing Restrictions: No   Pertinent Vitals/Pain No pain      Mobility  Bed Mobility Bed Mobility: Supine to Sit;Sitting - Scoot to Edge of Bed Supine to Sit: 1: +2 Total assist Supine to Sit: Patient Percentage: 20% Sitting - Scoot to Edge of Bed: 1: +2 Total assist Sitting - Scoot to Edge of Bed: Patient Percentage: 60% Details for Bed Mobility Assistance: Pt requires assist for B LE off of bed and for trunk to attain sitting position.  Provided multi modal cuing for technique and hand placement, however pt unable to fully follow commands.  He did initiate some movement with LEs.  Transfers Transfers: Sit to Stand;Stand to Sit Sit to Stand: 1: +2 Total assist;From elevated surface;With upper extremity assist;From bed Sit  to Stand: Patient Percentage: 40% Stand to Sit: 1: +2 Total assist;With upper extremity assist;With armrests;To chair/3-in-1 Stand to Sit: Patient Percentage: 50% Details for Transfer Assistance: Manual and verbal cues for hand placement and safety when sitting and standing.  Ambulation/Gait Ambulation/Gait Assistance: 1: +2 Total assist Ambulation/Gait: Patient Percentage: 50% Ambulation Distance (Feet): 5 Feet Assistive device: Rolling walker Ambulation/Gait Assistance Details: Cues for sequencing/technique with manual assist required for RW and to maintain hand placement on RW.  Recommend trying hand held assist on next visit due to decreased cognition.  Gait Pattern: Step-through pattern;Decreased stride length;Trunk flexed Gait velocity: decreased Stairs: No Wheelchair Mobility Wheelchair Mobility: No    Exercises     PT Diagnosis: Difficulty walking;Abnormality of gait;Generalized weakness  PT Problem List: Decreased strength;Decreased activity tolerance;Decreased balance;Decreased mobility;Decreased cognition;Decreased coordination;Decreased knowledge of use of DME;Decreased safety awareness PT Treatment Interventions: DME instruction;Gait training;Functional mobility training;Therapeutic activities;Therapeutic exercise;Balance training;Patient/family education   PT Goals Acute Rehab PT Goals PT Goal Formulation: Patient unable to participate in goal setting Time For Goal Achievement: 08/05/11 Potential to Achieve Goals: Fair Pt will go Supine/Side to Sit: with min assist PT Goal: Supine/Side to Sit - Progress: Goal set today Pt will go Sit to Supine/Side: with min assist PT Goal: Sit to Supine/Side - Progress: Goal set today Pt will go Sit to Stand: with supervision PT Goal: Sit to Stand - Progress: Goal set today Pt will go Stand to Sit: with supervision PT Goal: Stand to Sit - Progress: Goal set today Pt will Transfer Bed to Chair/Chair to  Bed: with min assist PT  Transfer Goal: Bed to Chair/Chair to Bed - Progress: Goal set today Pt will Ambulate: 16 - 50 feet;with min assist;with least restrictive assistive device PT Goal: Ambulate - Progress: Goal set today  Visit Information  Last PT Received On: 07/22/11 Assistance Needed: +2    Subjective Data  Subjective: Pt would verbalize, however very difficult to understand pt.  Patient Stated Goal: n/a.    Prior Functioning  Home Living Lives With: Spouse Additional Comments: Unsure of home environment due to decreased cognition and no family present at time of eval, however per notes, sounds like he was living at home with wife.  Prior Function Comments: Unsure of PLOF due to decreased cognition and no family present at time of eval.  Communication Communication: Expressive difficulties;HOH    Cognition  Overall Cognitive Status: No family/caregiver present to determine baseline cognitive functioning Arousal/Alertness: Lethargic Orientation Level: Disoriented to;Place;Time;Situation Behavior During Session: Lethargic    Extremity/Trunk Assessment Right Lower Extremity Assessment RLE ROM/Strength/Tone: Unable to fully assess;Due to impaired cognition RLE Coordination: WFL - gross motor Left Lower Extremity Assessment LLE ROM/Strength/Tone: Unable to fully assess;Due to impaired cognition LLE Coordination: WFL - gross motor Trunk Assessment Trunk Assessment: Kyphotic   Balance Balance Balance Assessed: Yes Static Sitting Balance Static Sitting - Balance Support: Bilateral upper extremity supported;Feet supported Static Sitting - Level of Assistance: 5: Stand by assistance;4: Min assist Static Sitting - Comment/# of Minutes: Intermittent min assist for sitting EOB x approx 5 mins.  Pt noted to have some lateral leaning, however could correct with manual and verbal cuing.   End of Session PT - End of Session Equipment Utilized During Treatment: Gait belt Activity Tolerance: Patient limited  by fatigue Patient left: in chair;with call bell/phone within reach;with chair alarm set Nurse Communication: Mobility status  GP Functional Assessment Tool Used: Functional assessment and clinical judgement.  Functional Limitation: Mobility: Walking and moving around Mobility: Walking and Moving Around Current Status [phone]): At least 40 percent but less than 60 percent impaired, limited or restricted Mobility: Walking and Moving Around Goal Status [phone]): At least 20 percent but less than 40 percent impaired, limited or restricted   Lessie Dings 07/22/2011, 9:54 AM

## 2011-07-22 NOTE — Progress Notes (Addendum)
[facility] Progress Note  07/22/2011  Subjective: Pt sitting up in chair this morning.  He says he did not sleep well in hospital.  He denies complaints.  He has tolerated receiving 2 units PRBCs.    Objective:  Vital signs in last 24 hours: Filed Vitals:   07/21/11 2040 07/21/11 2123 07/21/11 2247 07/22/11 0509  BP: 160/84 208/99 184/99 102/50  Pulse: 62 78 80 69  Temp: 97.8 F (36.6 C) 98.3 F (36.8 C)  98.2 F (36.8 C)  TempSrc: Oral Oral  Oral  Resp: 20 18  16   Height:      Weight:      SpO2:  98%     Weight change:   Intake/Output Summary (Last 24 hours) at 07/22/11 0920 Last data filed at 07/22/11 0141  Gross per 24 hour  Intake 1728.33 ml  Output   2495 ml  Net -766.67 ml   Lab Results  Component Value Date   HGBA1C 5.6 09/18/2010   Lab Results  Component Value Date   LDLCALC 69 07/30/2010   CREATININE 0.85 07/22/2011    Review of Systems As above, otherwise all reviewed and reported negative  Physical Exam General - awake, no distress, cooperative HEENT - NCAT, MMM Lungs - BBS, CTA CV - normal s1, s2 sounds with syst murmur Abd - soft, nondistended, no masses, nontender Ext - no C/C/E  Lab Results: Results for orders placed during the hospital encounter of 07/21/11 (from the past 24 hour(s))  CBC     Status: Abnormal   Collection Time   07/21/11 12:35 PM      Component Value Range   WBC 5.1  4.0 - 10.5 K/uL   RBC 2.63 (*) 4.22 - 5.81 MIL/uL   Hemoglobin 7.1 (*) 13.0 - 17.0 g/dL   HCT 16.1 (*) 09.6 - 04.5 %   MCV 85.2  78.0 - 100.0 fL   MCH 27.0  26.0 - 34.0 pg   MCHC 31.7  30.0 - 36.0 g/dL   RDW 40.9 (*) 81.1 - 91.4 %   Platelets 271  150 - 400 K/uL  DIFFERENTIAL     Status: Abnormal   Collection Time   07/21/11 12:35 PM      Component Value Range   Neutrophils Relative 68  43 - 77 %   Neutro Abs 3.5  1.7 - 7.7 K/uL   Lymphocytes Relative 16  12 - 46 %   Lymphs Abs 0.8  0.7 - 4.0 K/uL   Monocytes Relative 14 (*) 3 - 12 %   Monocytes  Absolute 0.7  0.1 - 1.0 K/uL   Eosinophils Relative 1  0 - 5 %   Eosinophils Absolute 0.1  0.0 - 0.7 K/uL   Basophils Relative 1  0 - 1 %   Basophils Absolute 0.1  0.0 - 0.1 K/uL  COMPREHENSIVE METABOLIC PANEL     Status: Abnormal   Collection Time   07/21/11 12:35 PM      Component Value Range   Sodium 132 (*) 135 - 145 mEq/L   Potassium 4.0  3.5 - 5.1 mEq/L   Chloride 101  96 - 112 mEq/L   CO2 23  19 - 32 mEq/L   Glucose, Bld 88  70 - 99 mg/dL   BUN 17  6 - 23 mg/dL   Creatinine, Ser 7.82  0.50 - 1.35 mg/dL   Calcium 8.8  8.4 - 95.6 mg/dL   Total Protein 6.4  6.0 - 8.3 g/dL  Albumin 2.8 (*) 3.5 - 5.2 g/dL   AST 21  0 - 37 U/L   ALT 12  0 - 53 U/L   Alkaline Phosphatase 79  39 - 117 U/L   Total Bilirubin 0.2 (*) 0.3 - 1.2 mg/dL   GFR calc non Af Amer 76 (*) >90 mL/min   GFR calc Af Amer 88 (*) >90 mL/min  APTT     Status: Normal   Collection Time   07/21/11 12:35 PM      Component Value Range   aPTT 30  24 - 37 seconds  PROTIME-INR     Status: Normal   Collection Time   07/21/11 12:35 PM      Component Value Range   Prothrombin Time 13.7  11.6 - 15.2 seconds   INR 1.03  0.00 - 1.49  TYPE AND SCREEN     Status: Normal   Collection Time   07/21/11 12:35 PM      Component Value Range   ABO/RH(D) O NEG     Antibody Screen NEG     Sample Expiration 07/24/2011     Unit Number [id number]     Blood Component Type RED CELLS,LR     Unit division 00     Status of Unit ISSUED,FINAL     Transfusion Status OK TO TRANSFUSE     Crossmatch Result Compatible     Unit Number [id number]     Blood Component Type RED CELLS,LR     Unit division 00     Status of Unit ISSUED,FINAL     Transfusion Status OK TO TRANSFUSE     Crossmatch Result Compatible    PREPARE RBC (CROSSMATCH)     Status: Normal   Collection Time   07/21/11 12:35 PM      Component Value Range   Order Confirmation ORDER PROCESSED BY BLOOD BANK    VITAMIN B12     Status: Normal   Collection Time   07/21/11 12:35 PM       Component Value Range   Vitamin B-12 502  211 - 911 pg/mL  FOLATE     Status: Normal   Collection Time   07/21/11 12:35 PM      Component Value Range   Folate >20.0    IRON AND TIBC     Status: Abnormal   Collection Time   07/21/11 12:35 PM      Component Value Range   Iron 47  42 - 135 ug/dL   TIBC 782  956 - 213 ug/dL   Saturation Ratios 18 (*) 20 - 55 %   UIBC 208  125 - 400 ug/dL  FERRITIN     Status: Abnormal   Collection Time   07/21/11 12:35 PM      Component Value Range   Ferritin 16 (*) 22 - 322 ng/mL  RETICULOCYTES     Status: Abnormal   Collection Time   07/21/11 12:35 PM      Component Value Range   Retic Ct Pct 4.0 (*) 0.4 - 3.1 %   RBC. 2.58 (*) 4.22 - 5.81 MIL/uL   Retic Count, Manual 103.2  19.0 - 186.0 K/uL  URINALYSIS, ROUTINE W REFLEX MICROSCOPIC     Status: Abnormal   Collection Time   07/21/11  2:46 PM      Component Value Range   Color, Urine RED (*) YELLOW   APPearance TURBID (*) CLEAR   Specific Gravity, Urine 1.030  1.005 - 1.030  pH 6.5  5.0 - 8.0   Glucose, UA NEGATIVE  NEGATIVE mg/dL   Hgb urine dipstick LARGE (*) NEGATIVE   Bilirubin Urine SMALL (*) NEGATIVE   Ketones, ur NEGATIVE  NEGATIVE mg/dL   Protein, ur >956 (*) NEGATIVE mg/dL   Urobilinogen, UA 0.2  0.0 - 1.0 mg/dL   Nitrite NEGATIVE  NEGATIVE   Leukocytes, UA MODERATE (*) NEGATIVE  URINE MICROSCOPIC-ADD ON     Status: Abnormal   Collection Time   07/21/11  2:46 PM      Component Value Range   Squamous Epithelial / LPF FEW (*) RARE   WBC, UA 11-20  <3 WBC/hpf   RBC / HPF TOO NUMEROUS TO COUNT  <3 RBC/hpf   Bacteria, UA FEW (*) RARE  CARDIAC PANEL(CRET KIN+CKTOT+MB+TROPI)     Status: Normal   Collection Time   07/21/11 11:35 PM      Component Value Range   Total CK 34  7 - 232 U/L   CK, MB 1.3  0.3 - 4.0 ng/mL   Troponin I <0.30  <0.30 ng/mL   Relative Index RELATIVE INDEX IS INVALID  0.0 - 2.5  TSH     Status: Abnormal   Collection Time   07/21/11 11:35 PM      Component Value Range     TSH 6.245 (*) 0.350 - 4.500 uIU/mL  PRO B NATRIURETIC PEPTIDE     Status: Abnormal   Collection Time   07/21/11 11:35 PM      Component Value Range   Pro B Natriuretic peptide (BNP) 1170.0 (*) 0 - 450 pg/mL  CARDIAC PANEL(CRET KIN+CKTOT+MB+TROPI)     Status: Normal   Collection Time   07/22/11  7:30 AM      Component Value Range   Total CK 29  7 - 232 U/L   CK, MB 1.2  0.3 - 4.0 ng/mL   Troponin I <0.30  <0.30 ng/mL   Relative Index RELATIVE INDEX IS INVALID  0.0 - 2.5  CBC     Status: Abnormal   Collection Time   07/22/11  7:45 AM      Component Value Range   WBC 25.4 (*) 4.0 - 10.5 K/uL   RBC 3.54 (*) 4.22 - 5.81 MIL/uL   Hemoglobin 10.0 (*) 13.0 - 17.0 g/dL   HCT 21.3 (*) 08.6 - 57.8 %   MCV 84.7  78.0 - 100.0 fL   MCH 28.2  26.0 - 34.0 pg   MCHC 33.3  30.0 - 36.0 g/dL   RDW 46.9 (*) 62.9 - 52.8 %   Platelets 266  150 - 400 K/uL  BASIC METABOLIC PANEL     Status: Abnormal   Collection Time   07/22/11  7:45 AM      Component Value Range   Sodium 130 (*) 135 - 145 mEq/L   Potassium 3.4 (*) 3.5 - 5.1 mEq/L   Chloride 99  96 - 112 mEq/L   CO2 22  19 - 32 mEq/L   Glucose, Bld 108 (*) 70 - 99 mg/dL   BUN 15  6 - 23 mg/dL   Creatinine, Ser 4.13  0.50 - 1.35 mg/dL   Calcium 8.3 (*) 8.4 - 10.5 mg/dL   GFR calc non Af Amer 78 (*) >90 mL/min   GFR calc Af Amer >90  >90 mL/min    Micro Results: No results found for this or any previous visit (from the past 240 hour(s)).  Medications:  Scheduled Meds:   .  amiodarone  200 mg Oral BID  . aspirin  81 mg Oral Daily  . atorvastatin  20 mg Oral Daily  . citalopram  10 mg Oral Daily  . feeding supplement  237 mL Oral TID BM  . ferrous sulfate  325 mg Oral BID  . furosemide  20 mg Intravenous Once  . irbesartan  37.5 mg Oral Daily  . LORazepam  1 mg Oral Q8H  . metoprolol succinate  25 mg Oral Daily  . multivitamin with minerals  1 tablet Oral Daily  . pantoprazole  40 mg Oral Q1200  . potassium chloride SA  20 mEq Oral Daily   . sodium chloride  3 mL Intravenous Q12H  . thiamine  100 mg Oral Daily  . DISCONTD: NON FORMULARY 40 mg  40 mg Oral Daily   Continuous Infusions:   . DISCONTD: sodium chloride 1,000 mL (07/21/11 1305)  . DISCONTD: sodium chloride 35 mL/hr at 07/21/11 1856   PRN Meds:.acetaminophen, acetaminophen, zolpidem, DISCONTD: ondansetron (ZOFRAN) IV, DISCONTD: ondansetron  Assessment/Plan: Acute blood loss Anemia - from chronic gross hematuria/hemorrhagic cystitis - pt s/p 2 units PRBCs, Hg now at 10.  Pt currently undergoing treatment per urology and receiving hyperbaric oxygen treatments.  Leukocytosis - pt is s/p 2 units of PRBCs, also pt likely has UTI although he has been asymptomatic, he has chronic indwelling foley cath that was switched out yesterday to a 26 french catheter, will start cipro IV today per pharmacy dosing and home on cipro, follow up with urologist and pcp for recheck  HTN - resumed home blood pressure medications, better bp control this morning, following  Recurrent Falls- PT consult ordered for today  Hyponatremia - following  Anxiety/depression - resuming home medications  Bradycardia - following on telemetry  CAD - ruled out for acute myocardial injury by enzymes  S/p AVR  Hypokalemia - replace potassium orally today  Elevated TSH - pt hypothyroid, will start low dose levothyroxine, recheck TSH by PCP in 4 - 6 weeks   LOS: 1 day   Adain Geurin 07/22/2011, 9:20 AM  Cleora Fleet, MD, CDE, FAAFP [facility] [place], [place]  [phone]

## 2011-07-22 NOTE — Progress Notes (Signed)
ANTIBIOTIC CONSULT NOTE - INITIAL  Pharmacy Consult for Levaquin Indication: Probable UTI  Allergies  Allergen Reactions  . Tolterodine Tartrate     Reaction=unknown    Patient Measurements: Height: 5\' 11"  (180.3 cm) Weight: 161 lb 14.4 oz (73.437 kg) IBW/kg (Calculated) : 75.3    Vital Signs: Temp: 98.2 F (36.8 C) (07/04 0509) Temp src: Oral (07/04 0509) BP: 102/50 mmHg (07/04 0509) Pulse Rate: 69  (07/04 0509) Intake/Output from previous day: 07/03 0701 - 07/04 0700 In: 1728.3 [P.O.:320; I.V.:750; Blood:658.3] Out: 2495 [Urine:2495] Intake/Output from this shift:    Labs:  Basename 07/22/11 0745 07/21/11 1235  WBC 25.4* 5.1  HGB 10.0* 7.1*  PLT 266 271  LABCREA -- --  CREATININE 0.85 0.92   Microbiology: No results found for this or any previous visit (from the past 720 hour(s)).  Medical History: Past Medical History  Diagnosis Date  . ADENOCARCINOMA, PROSTATE   . ALLERGIC RHINITIS   . B12 DEFICIENCY   . BENIGN PROSTATIC HYPERTROPHY   . CAROTID ARTERY DISEASE   . CEREBROVASCULAR ACCIDENT, HX OF   . CORONARY ARTERY DISEASE   . DIVERTICULOSIS, COLON   . GERD   . HEMORRHOIDS   . HIATAL HERNIA   . HYPERLIPIDEMIA   . HYPERTENSION   . OSTEOPENIA   . PEPTIC ULCER DISEASE   . PERIPHERAL VASCULAR DISEASE   . Personal history of alcoholism   . PROSTATE CANCER, HX OF   . PUD, HX OF   . TRANSIENT ISCHEMIC ATTACK   . Shingles   . Anemia   . AS (aortic stenosis)   . S/P AVR (aortic valve replacement) 09/23/2010  . Complication of anesthesia 09/23/2010    very confused after waking up. Stopped drinking 40 days before this surgery.  . Transfusion reaction, chill fever type 04/28/2011    Fever, rigors, tachycardia,tachypnea but no evidence of hemolysis  . Hemorrhagic cystitis 04/28/2011    Due to previous radiation for PSA recurrence of prostate cancer  . Blood transfusion   . Gross hematuria     Medications:  Scheduled:    . amiodarone  200 mg Oral BID    . aspirin  81 mg Oral Daily  . atorvastatin  20 mg Oral Daily  . citalopram  10 mg Oral Daily  . feeding supplement  237 mL Oral TID BM  . ferrous sulfate  325 mg Oral BID  . furosemide  20 mg Intravenous Once  . irbesartan  37.5 mg Oral Daily  . levothyroxine  25 mcg Oral QAC breakfast  . LORazepam  1 mg Oral Q8H  . metoprolol succinate  25 mg Oral Daily  . multivitamin with minerals  1 tablet Oral Daily  . pantoprazole  40 mg Oral Q1200  . potassium chloride SA  30 mEq Oral BID  . sodium chloride  3 mL Intravenous Q12H  . thiamine  100 mg Oral Daily  . DISCONTD: NON FORMULARY 40 mg  40 mg Oral Daily  . DISCONTD: potassium chloride SA  20 mEq Oral Daily   Assessment:  78 y/o M admitted with anemia, hematuria, and near syncope.  Has indwelling Foley catheter, noticed recent leaking around catheter.  U/A (+), urine culture pending.  To begin empiric Levaquin for probable UTI.  Goal of Therapy:  Levaquin dosage adjusted to renal function Eradication of infection  Plan:  Levaquin 500mg  IV q24h  Zyra Parrillo, [place] Barban, PharmD, BCPS Pager: [phone] 07/22/2011,9:34 AM

## 2011-07-22 NOTE — Progress Notes (Signed)
Patient refused all po meds, states I don't want them at all. MD notified via text.

## 2011-07-22 NOTE — Progress Notes (Signed)
Pharmacy Brief Note - Antibiotic Consult  ADDENDUM:  Given concomitant amiodarone therapy, spoke with Dr. Laural Benes and suggested changing Levaquin to Cipro since the latter may have less of an effect on the QTc interval in setting of concomitant amiodarone therapy.  Orders received.  Plan: 1. DC Levaquin 2. Cipro 400mg  IV q12h  Elie Goody, PharmD, BCPS Pager: [phone] 07/22/2011  10:18 AM

## 2011-07-23 DIAGNOSIS — E871 Hypo-osmolality and hyponatremia: Secondary | ICD-10-CM

## 2011-07-23 DIAGNOSIS — N3090 Cystitis, unspecified without hematuria: Secondary | ICD-10-CM

## 2011-07-23 DIAGNOSIS — I4891 Unspecified atrial fibrillation: Secondary | ICD-10-CM

## 2011-07-23 LAB — URINE CULTURE
Colony Count: NO GROWTH
Culture: NO GROWTH

## 2011-07-23 LAB — CBC
HCT: 29.8 % — ABNORMAL LOW (ref 39.0–52.0)
MCH: 28 pg (ref 26.0–34.0)
MCHC: 32.6 g/dL (ref 30.0–36.0)
MCV: 85.9 fL (ref 78.0–100.0)
RDW: 16.2 % — ABNORMAL HIGH (ref 11.5–15.5)

## 2011-07-23 MED ORDER — HYDROCORTISONE ACETATE 25 MG RE SUPP
25.0000 mg | Freq: Two times a day (BID) | RECTAL | Status: DC | PRN
  Filled 2011-07-23: qty 1

## 2011-07-23 MED ORDER — BOOST / RESOURCE BREEZE PO LIQD
1.0000 | Freq: Three times a day (TID) | ORAL | Status: DC
  Administered 2011-07-23: 1 via ORAL

## 2011-07-23 MED ORDER — AMPICILLIN 500 MG PO CAPS
500.0000 mg | ORAL_CAPSULE | Freq: Three times a day (TID) | ORAL | Status: DC
  Administered 2011-07-23 – 2011-07-24 (×3): 500 mg via ORAL
  Filled 2011-07-23 (×8): qty 1

## 2011-07-23 NOTE — Progress Notes (Signed)
INITIAL ADULT NUTRITION ASSESSMENT Date: 07/23/2011   Time: 10:15 AM Reason for Assessment: Nutrition Risk-weight loss  ASSESSMENT: Male 78 y.o.  Dx: Acute blood loss anemia from hematuria related to prostate cancer. Leukocytosis, recent falls   Hx:  Past Medical History  Diagnosis Date  . ADENOCARCINOMA, PROSTATE   . ALLERGIC RHINITIS   . B12 DEFICIENCY   . BENIGN PROSTATIC HYPERTROPHY   . CAROTID ARTERY DISEASE   . CEREBROVASCULAR ACCIDENT, HX OF   . CORONARY ARTERY DISEASE   . DIVERTICULOSIS, COLON   . GERD   . HEMORRHOIDS   . HIATAL HERNIA   . HYPERLIPIDEMIA   . HYPERTENSION   . OSTEOPENIA   . PEPTIC ULCER DISEASE   . PERIPHERAL VASCULAR DISEASE   . Personal history of alcoholism   . PROSTATE CANCER, HX OF   . PUD, HX OF   . TRANSIENT ISCHEMIC ATTACK   . Shingles   . Anemia   . AS (aortic stenosis)   . S/P AVR (aortic valve replacement) 09/23/2010  . Complication of anesthesia 09/23/2010    very confused after waking up. Stopped drinking 40 days before this surgery.  . Transfusion reaction, chill fever type 04/28/2011    Fever, rigors, tachycardia,tachypnea but no evidence of hemolysis  . Hemorrhagic cystitis 04/28/2011    Due to previous radiation for PSA recurrence of prostate cancer  . Blood transfusion   . Gross hematuria     Past Surgical History  Procedure Date  . Popliteal synovial cyst excision   . Right carotid endarterectomy 1999    Left carotid total chronic occlusion  . Prostate surgery   . Cardic stent 2004  . Aortic valve replacement 09/23/2010    #69mm [facility] Ease pericardial tissue valve  . Coronary artery bypass graft 09/23/2010    CABG x1 using LIMA to LAD  . Cystoscopy with biopsy 02/15/2011    Procedure: CYSTOSCOPY WITH BIOPSY;  Surgeon: Anner Crete, MD;  Location: [facility];  Service: Urology;  Laterality: N/A;  Cystoscopy, biopsy and fulguration of the bladder    Related Meds:     . amiodarone  200 mg Oral BID  . aspirin  81 mg  Oral Daily  . atorvastatin  20 mg Oral Daily  . ciprofloxacin  400 mg Intravenous Q12H  . citalopram  10 mg Oral Daily  . feeding supplement  1 Container Oral BID BM  . ferrous sulfate  325 mg Oral BID  . irbesartan  37.5 mg Oral Daily  . levothyroxine  25 mcg Oral QAC breakfast  . LORazepam  1 mg Oral Q8H  . metoprolol succinate  25 mg Oral Daily  . multivitamin with minerals  1 tablet Oral Daily  . pantoprazole  40 mg Oral Q1200  . potassium chloride SA  30 mEq Oral BID  . sodium chloride  3 mL Intravenous Q12H  . thiamine  100 mg Oral Daily  . DISCONTD: ciprofloxacin  400 mg Intravenous Q12H  . DISCONTD: feeding supplement  237 mL Oral TID BM     Ht: 5\' 11"  (180.3 cm)  Wt: 161 lb 14.4 oz (73.437 kg)  Ideal Wt: 78 kg  % Ideal Wt: 94  Usual Wt:  Wt Readings from Last 15 Encounters:  07/21/11 161 lb 14.4 oz (73.437 kg)  06/23/11 162 lb (73.483 kg)  06/18/11 162 lb 12.8 oz (73.846 kg)  06/07/11 157 lb 3 oz (71.3 kg)  05/11/11 157 lb 6.5 oz (71.4 kg)  02/15/11 173 lb (  78.472 kg)  02/15/11 173 lb (78.472 kg)  02/12/11 173 lb (78.472 kg)  01/29/11 174 lb (78.926 kg)  11/11/10 169 lb 8 oz (76.885 kg)  11/04/10 176 lb (79.833 kg)  11/02/10 170 lb (77.111 kg)  10/15/10 169 lb (76.658 kg)  08/05/10 184 lb (83.462 kg)  07/30/10 188 lb 12.8 oz (85.639 kg)   % Usual Wt: 86% of weight 1 year ago, 94 % of weight 6 months ago  Body mass index is 22.58 kg/(m^2).   Labs:  CMP     Component Value Date/Time   NA 130* 07/22/2011 0745   K 3.4* 07/22/2011 0745   CL 99 07/22/2011 0745   CO2 22 07/22/2011 0745   GLUCOSE 108* 07/22/2011 0745   BUN 15 07/22/2011 0745   CREATININE 0.85 07/22/2011 0745   CREATININE 1.05 01/29/2011 1708   CALCIUM 8.3* 07/22/2011 0745   PROT 6.4 07/21/2011 1235   ALBUMIN 2.8* 07/21/2011 1235   AST 21 07/21/2011 1235   ALT 12 07/21/2011 1235   ALKPHOS 79 07/21/2011 1235   BILITOT 0.2* 07/21/2011 1235   GFRNONAA 78* 07/22/2011 0745   GFRAA >90 07/22/2011 0745   Vitamin B-12  502-WNL 07/21/11  I/O last 3 completed shifts: In: 1333.3 [P.O.:600; Blood:333.3; IV Piggyback:400] Out: 3045 [Urine:3045]    Diet Order: Cardiac- 50%  Supplements/Tube Feeding:Ensure Pudding bid  IVF:    Estimated Nutritional Needs:   Kcal: 1900-2100   Protein: 95-110 g Fluid: >1.8L  Food/Nutrition Related Hx: Pt reports that he eats well at home.  Enjoys wife's cooking and loves candy.  Dislikes Ensure and won't drink it.    Intake poor here with decreased appetite.  Wife brought pt candy bars.   PT reports pt with decreased strength, mobility and increased fatigue NUTRITION DIAGNOSIS: -Inadequate oral intake (NI-2.1).  Status: Ongoing  RELATED TO: decreased appetite  AS EVIDENCE BY: reported poor po  MONITORING/EVALUATION(Goals): Monitor:  Intake, weight, labs Goal:  Maximize intake to prevent further weight loss.  EDUCATION NEEDS: -No education needs identified at this time  INTERVENTION:  Rec change diet to Regular to liberalize choices  Will add Resource breeze tid  Encouraged po  Dietitian [phone]  DOCUMENTATION CODES Per approved criteria  -Not Applicable    Jeoffrey Massed 07/23/2011, 10:15 AM

## 2011-07-23 NOTE — Progress Notes (Signed)
[facility] Progress Note  07/23/2011   Subjective: Pt says he is feeling better today.  No fever or chills.  No nausea or vomiting.    Objective:  Vital signs in last 24 hours: Filed Vitals:   07/22/11 0509 07/22/11 1341 07/22/11 2157 07/23/11 0658  BP: 102/50 89/47 115/62 109/65  Pulse: 69 82 78 89  Temp: 98.2 F (36.8 C) 98.1 F (36.7 C) 98.1 F (36.7 C) 98.3 F (36.8 C)  TempSrc: Oral Oral Oral Oral  Resp: 16 18 18 18   Height:      Weight:      SpO2:  96% 97% 95%   Weight change:   Intake/Output Summary (Last 24 hours) at 07/23/11 1255 Last data filed at 07/23/11 0700  Gross per 24 hour  Intake    560 ml  Output    750 ml  Net   -190 ml   Lab Results  Component Value Date   HGBA1C 5.6 09/18/2010   Lab Results  Component Value Date   LDLCALC 69 07/30/2010   CREATININE 0.85 07/22/2011    Review of Systems As above, otherwise all reviewed and reported negative  Physical Exam General - awake, no distress, cooperative HEENT - NCAT, MMM Lungs - BBS, CTA CV - normal s1, s2 sounds Abd - soft, nondistended, no masses, nontender Ext - no C/C/E  Lab Results: Results for orders placed during the hospital encounter of 07/21/11 (from the past 24 hour(s))  CARDIAC PANEL(CRET KIN+CKTOT+MB+TROPI)     Status: Normal   Collection Time   07/22/11  3:00 PM      Component Value Range   Total CK 22  7 - 232 U/L   CK, MB 0.5  0.3 - 4.0 ng/mL   Troponin I <0.30  <0.30 ng/mL   Relative Index RELATIVE INDEX IS INVALID  0.0 - 2.5  CBC     Status: Abnormal   Collection Time   07/23/11  4:25 AM      Component Value Range   WBC 26.6 (*) 4.0 - 10.5 K/uL   RBC 3.47 (*) 4.22 - 5.81 MIL/uL   Hemoglobin 9.7 (*) 13.0 - 17.0 g/dL   HCT 40.9 (*) 81.1 - 91.4 %   MCV 85.9  78.0 - 100.0 fL   MCH 28.0  26.0 - 34.0 pg   MCHC 32.6  30.0 - 36.0 g/dL   RDW 78.2 (*) 95.6 - 21.3 %   Platelets 248  150 - 400 K/uL    Micro Results: No results found for this or any previous visit (from  the past 240 hour(s)).  Medications:  Scheduled Meds:   . amiodarone  200 mg Oral BID  . aspirin  81 mg Oral Daily  . atorvastatin  20 mg Oral Daily  . ciprofloxacin  400 mg Intravenous Q12H  . citalopram  10 mg Oral Daily  . feeding supplement  1 Container Oral BID BM  . ferrous sulfate  325 mg Oral BID  . irbesartan  37.5 mg Oral Daily  . levothyroxine  25 mcg Oral QAC breakfast  . LORazepam  1 mg Oral Q8H  . metoprolol succinate  25 mg Oral Daily  . multivitamin with minerals  1 tablet Oral Daily  . pantoprazole  40 mg Oral Q1200  . potassium chloride SA  30 mEq Oral BID  . sodium chloride  3 mL Intravenous Q12H  . thiamine  100 mg Oral Daily  . DISCONTD: ciprofloxacin  400 mg Intravenous Q12H  .  DISCONTD: feeding supplement  237 mL Oral TID BM   Continuous Infusions:  PRN Meds:.acetaminophen, acetaminophen, zolpidem  Assessment/Plan: Acute blood loss Anemia - from chronic gross hematuria/hemorrhagic cystitis - pt s/p 2 units PRBCs, Hg holding stable.  Pt currently undergoing treatment per urology and receiving hyperbaric oxygen treatments.   Leukocytosis - pt is s/p 2 units of PRBCs, also pt likely has UTI although he has been asymptomatic, he has chronic indwelling foley cath that was switched out yesterday to a 26 french catheter, will start cipro IV today per pharmacy dosing and home on cipro, follow up with urologist and pcp for recheck  Awaiting urine culture results.  Continue cipro IV.    UTI - continue cipro awaiting urine cultures  HTN - resumed home blood pressure medications, better bp control this morning, following   Recurrent Falls- PT recommended SNF.  I called pt's wife and she says the patient tried SNF recently and did not like the experience and would not return to one. She says that she will care for him at home.  Will arrange for HHPT at discharge.     Hypokalemia - continue oral potassium  Anxiety/depression - resuming home medications    Bradycardia - following on telemetry   CAD - ruled out for acute myocardial injury by enzymes   S/p AVR   Elevated TSH - pt hypothyroid,  started low dose levothyroxine, recheck TSH by PCP in 4 - 6 weeks   LOS: 2 days   Clanford Johnson 07/23/2011, 12:55 PM  Cleora Fleet, MD, CDE, FAAFP [facility] [place], [place]  [phone]

## 2011-07-23 NOTE — Progress Notes (Signed)
CSW met with patient. Patient asleep and CSW unable to awaken. CSW called and left voicemail for patients spouse. Will have weekend CSW follow up.  Emmi Wertheim C. Kshawn Canal MSW, LCSW [phone]

## 2011-07-24 DIAGNOSIS — K529 Noninfective gastroenteritis and colitis, unspecified: Secondary | ICD-10-CM | POA: Diagnosis present

## 2011-07-24 DIAGNOSIS — E039 Hypothyroidism, unspecified: Secondary | ICD-10-CM

## 2011-07-24 DIAGNOSIS — A0472 Enterocolitis due to Clostridium difficile, not specified as recurrent: Secondary | ICD-10-CM | POA: Diagnosis present

## 2011-07-24 DIAGNOSIS — N179 Acute kidney failure, unspecified: Secondary | ICD-10-CM

## 2011-07-24 DIAGNOSIS — C61 Malignant neoplasm of prostate: Secondary | ICD-10-CM

## 2011-07-24 MED ORDER — LEVOTHYROXINE SODIUM 25 MCG PO TABS: 25.0000 ug | ORAL_TABLET | Freq: Every day | ORAL | Status: DC

## 2011-07-24 MED ORDER — ACIDOPHILUS PROBIOTIC 100 MG PO CAPS: 1.0000 | ORAL_CAPSULE | Freq: Three times a day (TID) | ORAL | Status: DC

## 2011-07-24 MED ORDER — METRONIDAZOLE 500 MG PO TABS: 500.0000 mg | ORAL_TABLET | Freq: Three times a day (TID) | ORAL | Status: AC

## 2011-07-24 MED ORDER — HYDROCORTISONE 2.5 % RE CREA
TOPICAL_CREAM | Freq: Two times a day (BID) | RECTAL | Status: DC | PRN
  Filled 2011-07-24: qty 28.35

## 2011-07-24 MED ORDER — INSULIN ASPART 100 UNIT/ML ~~LOC~~ SOLN: 6.0000 [IU] | Freq: Three times a day (TID) | SUBCUTANEOUS | Status: DC

## 2011-07-24 NOTE — Progress Notes (Signed)
Physical Therapy Treatment Patient Details Name: [name] MRN: [id number] DOB: 02-25-1926 Today's Date: 07/24/2011 Time: 4098-1191 PT Time Calculation (min): 19 min  PT Assessment / Plan / Recommendation Comments on Treatment Session  Pt MUCH improved from previous session/evaluation.  Tolerated ambulation very well with RW then no AD and is A &O.      Follow Up Recommendations  Home health PT    Barriers to Discharge        Equipment Recommendations  None recommended by PT    Recommendations for Other Services    Frequency Min 3X/week   Plan Discharge plan needs to be updated    Precautions / Restrictions Precautions Precautions: Fall Restrictions Weight Bearing Restrictions: No   Pertinent Vitals/Pain No pain    Mobility  Bed Mobility Bed Mobility: Supine to Sit Supine to Sit: 5: Supervision Sitting - Scoot to Edge of Bed: 5: Supervision Details for Bed Mobility Assistance: Supervision for safety with cues for safety due to slight impulsivity when getting OOB.  Transfers Transfers: Sit to Stand;Stand to Sit Sit to Stand: 4: Min guard;With upper extremity assist;With armrests Stand to Sit: 5: Supervision;With upper extremity assist;With armrests;To chair/3-in-1 Details for Transfer Assistance: Min cues for hand placement and safety when sitting/standing.  Ambulation/Gait Ambulation/Gait Assistance: 5: Supervision;4: Min guard Ambulation Distance (Feet): 400 Feet Assistive device: Rolling walker;None Ambulation/Gait Assistance Details: Used RW initially with pt ambulating at supervision/minguard level.  Transitioned to no AD with pt doing very well at min/guard assist with cues for safety and dereeased gait speed.  Gait Pattern: Step-through pattern;Within Functional Limits Gait velocity: increased, with cues to decrease somewhat for safety.     Exercises     PT Diagnosis:    PT Problem List:   PT Treatment Interventions:     PT Goals Acute Rehab PT  Goals PT Goal Formulation: With patient Time For Goal Achievement: 08/05/11 Potential to Achieve Goals: Good Pt will go Supine/Side to Sit: with modified independence PT Goal: Supine/Side to Sit - Progress: Updated due to goal met Pt will go Sit to Stand: with supervision PT Goal: Sit to Stand - Progress: Progressing toward goal Pt will go Stand to Sit: with modified independence PT Goal: Stand to Sit - Progress: Updated due to goals met Pt will Transfer Bed to Chair/Chair to Bed: with modified independence PT Transfer Goal: Bed to Chair/Chair to Bed - Progress: Updated due to goal met Pt will Ambulate: >150 feet;Independently PT Goal: Ambulate - Progress: Updated due to goal met  Visit Information  Last PT Received On: 07/24/11 Assistance Needed: +1    Subjective Data  Subjective: I feel much better since I got blood Patient Stated Goal: to get home   Cognition  Overall Cognitive Status: Appears within functional limits for tasks assessed/performed Arousal/Alertness: Awake/alert Orientation Level: Oriented X4 / Intact Behavior During Session: [name] for tasks performed Cognition - Other Comments: Pt alert and oriented today during session and able to provide PLOF.     Balance     End of Session PT - End of Session Equipment Utilized During Treatment: Gait belt Activity Tolerance: Patient tolerated treatment well Patient left: in chair;with call bell/phone within reach;with chair alarm set Nurse Communication: Mobility status   GP     Page, Meribeth Mattes 07/24/2011, 9:50 AM

## 2011-07-24 NOTE — Progress Notes (Signed)
PT recommended HHPT.   CM notified.  CSW signing off.   Leron Croak, LCSWA [employer] [phone]

## 2011-07-24 NOTE — Progress Notes (Signed)
Patient being discharged with foley catheter in place.  Set up prior to admission with [facility] for foley care.

## 2011-07-24 NOTE — Discharge Summary (Signed)
Physician Discharge Summary  Patient ID: [name] MRN: [id number] DOB/AGE: [birth year] 78 y.o.  Admit date: 07/21/2011 Discharge date: 07/24/2011   THINGS FOR PCP TO FOLLOW UP:  Please check results of c. diff toxin test pending at discharge, please recheck and follow H/H.    Admission Diagnoses: Acute blood loss anemia Chronic diarrhea Hemorrhagic cystitis HTN S/p AVR  Discharge Diagnoses:  Principal Problem:  *Acute blood loss anemia  CORONARY ARTERY DISEASE  GERD  CEREBROVASCULAR ACCIDENT, HX OF  S/P AVR (aortic valve replacement)  Hypertension  Hemorrhagic cystitis  Recurrent falls  Gross hematuria  Hypothyroidism  Chronic diarrhea  History of Clostridium difficile infection, possibly not completely resolved  Discharged Condition: stable  Hospital Course: [name] is an 78 y.o. male who was sent to the emergency department by his primary care physician today after having 2 episodes of near-syncope. The patient reports that he has been severely fatigued in the past several days. He suffers from gross hematuria related to hemorrhagic cystitis from radiation from prostate cancer. The patient is currently undergoing therapy with hyperbaric oxygen treatments. He has received approximately 8 treatments. The patient is expected to complete 40 treatments. The patient has large amounts of gross hematuria on a daily basis requiring intermittent blood transfusions. The patient reports no chest pain or shortness of breath. He has been severely fatigued recently. Also of note, his wife reports that she's had been cutting his blood pressure medications and have because of concerns about his weakness. She reports that he has frequent falls at home. The patient's hemoglobin was 9.1 in May of this year and now has dropped to 7.1. The patient was sent to the GERD for evaluation for possible blood transfusion. The ER doctors evaluated the patient and found the patient to be slightly  hyponatremic and anemic. He was very pale. The patient was started on a blood transfusion and hospitalization was requested for observation. The patient was also found to be bradycardic with a pulse for approximately 42. The patient does take beta blockers.  Acute blood loss Anemia - from chronic gross hematuria/hemorrhagic cystitis - pt s/p 2 units PRBCs, Hg holding stable at 8.5. Pt currently undergoing treatment per urology and receiving hyperbaric oxygen treatments.  Pt to follow up with PCP in 3 days for repeat hemoglobin.  He will likely need periodic blood transfusions until the hemorragic cystitis has resolved.    Hemorrhagic Cystitis - pt to resume hyperbaric oxygen treatments on Monday  Leukocytosis - WBC increased after 2 units of PRBCs, he has chronic diarrhea and likely still has residual c.diff infection, will prescribe flagyl x 14 days, follow up with pcp.  Urine culture results negative for infection.   History of c.diff infection - I'm sending patietn home on flagyl and probiotics for presumed residual infection.  Pt to follow up with PCP for pending results of c.diff toxin culture  UTI - urine culture results negative for infections and antibiotics discontinued.   HTN - resumed home blood pressure medications, better bp control this morning,  Recurrent Falls- PT recommended SNF. I called pt's wife and she says the patient tried SNF recently and did not like the experience and would not return to one. She says that she will care for him at home. Will arrange for HHPT at discharge.  Pt improved with PT prior to discharge  Hypokalemia - repleted with oral potassium   Anxiety/depression - resuming home medications   Bradycardia - followed on telemetry and remained stable,  pt asymptomatic  CAD - ruled out for acute myocardial injury by enzymes   S/p AVR   Elevated TSH - pt hypothyroid, started low dose levothyroxine, recheck TSH by PCP in 4 - 6 weeks  Discharge Exam: Pt  asking to go home, no distress, ambulating well, feels much stronger after blood, diarrhea slowing down Blood pressure 109/76, pulse 69, temperature 97.4 F (36.3 C), temperature source Oral, resp. rate 19, height 5\' 11"  (1.803 m), weight 73.437 kg (161 lb 14.4 oz), SpO2 100.00%. General - awake, alert, no distress Heent - NCAT, MMM Lungs - BBS cta Abd - soft, nondistended, nontender, no masses CV - normal s1, s2 sounds Ext - no CCE Neuro - nonfocal  Disposition: 06-Home-Health Care [facility] PT  Discharge Orders    Future Appointments: Provider: Department: Dept Phone: Center:   07/28/2011 11:00 AM Corwin Levins, MD [facility] [phone] [facility]   08/03/2011 1:00 PM Corwin Levins, MD [facility] [phone] [facility]   08/23/2011 8:15 AM [facility] [phone] [facility]     Future Orders Please Complete By Expires   Increase activity slowly      Discharge instructions      Comments:   Please follow up with urologist and primary care physician next week.  Please resume hyperbaric oxygen treatments on Monday Return if symptoms recur, worsen or new problems develop.     Medication List  As of 07/24/2011 12:21 PM   TAKE these medications         ACIDOPHILUS PROBIOTIC 100 MG Caps   Take 1 capsule (100 mg total) by mouth 3 (three) times daily with meals.      amiodarone 200 MG tablet   Commonly known as: PACERONE   Take 200 mg by mouth 2 (two) times daily.      aspirin 81 MG tablet   Take 81 mg by mouth daily.      atorvastatin 20 MG tablet   Commonly known as: LIPITOR   Take 1 tablet (20 mg total) by mouth daily.      citalopram 10 MG tablet   Commonly known as: CELEXA   Take 1 tablet (10 mg total) by mouth daily.      feeding supplement Liqd   Take 237 mLs by mouth 3 (three) times daily between meals.      ferrous sulfate 325 (65 FE) MG tablet   Take 1 tablet (325 mg total) by mouth 2 (two) times daily.      levothyroxine 25 MCG tablet   Commonly known as:  SYNTHROID, LEVOTHROID   Take 1 tablet (25 mcg total) by mouth daily before breakfast.      LORazepam 1 MG tablet   Commonly known as: ATIVAN   Take 1 mg by mouth every 8 (eight) hours.      metoprolol succinate 25 MG 24 hr tablet   Commonly known as: TOPROL-XL   Take 1 tablet (25 mg total) by mouth daily.      metroNIDAZOLE 500 MG tablet   Commonly known as: FLAGYL   Take 1 tablet (500 mg total) by mouth 3 (three) times daily.      multivitamin with minerals Tabs   Take 1 tablet by mouth daily.      omeprazole 40 MG capsule   Commonly known as: PRILOSEC   Take 40 mg by mouth daily.      potassium chloride SA 20 MEQ tablet   Commonly known as: K-DUR,KLOR-CON   Take 1 tablet (  20 mEq total) by mouth daily.      thiamine 100 MG tablet   Take 1 tablet (100 mg total) by mouth daily.      valsartan 40 MG tablet   Commonly known as: DIOVAN   Take 1 tablet (40 mg total) by mouth daily.      vitamin A & D cream   Apply 1 application topically daily.           Follow-up Information    Follow up with Oliver Barre, MD. Schedule an appointment as soon as possible for a visit in 3 days. (hospital follow up, check hemoglobin)    Contact information:   [address] [facility] [address] [place] [address] [place] [address] [phone]       Follow up with Urologist . Schedule an appointment as soon as possible for a visit in 1 week. (hospital follow up)         I spent 38 mins preparing discharge, reconciling meds, writing prescriptions, arranging follow up, counseling patient.  SignedStandley Dakins 07/24/2011, 12:21 PM Pager [phone]

## 2011-07-27 ENCOUNTER — Telehealth: Payer: Self-pay

## 2011-07-27 NOTE — Telephone Encounter (Signed)
Ok to take in this case

## 2011-07-27 NOTE — Telephone Encounter (Signed)
Called the patients home number left message to call back

## 2011-07-27 NOTE — Telephone Encounter (Signed)
[facility] called to inform of a Level 1 drug interaction between Citalopram and amiodarone , please advise if ok to continue to take.  [facility] requesting family to be informed.

## 2011-07-28 ENCOUNTER — Encounter: Payer: Self-pay | Admitting: Internal Medicine

## 2011-07-28 ENCOUNTER — Ambulatory Visit (INDEPENDENT_AMBULATORY_CARE_PROVIDER_SITE_OTHER): Payer: Medicare Other | Admitting: Internal Medicine

## 2011-07-28 ENCOUNTER — Other Ambulatory Visit (INDEPENDENT_AMBULATORY_CARE_PROVIDER_SITE_OTHER): Payer: Medicare Other

## 2011-07-28 VITALS — BP 104/62 | HR 89 | Temp 97.0°F | Ht 71.0 in | Wt 158.5 lb

## 2011-07-28 DIAGNOSIS — N390 Urinary tract infection, site not specified: Secondary | ICD-10-CM | POA: Insufficient documentation

## 2011-07-28 DIAGNOSIS — D62 Acute posthemorrhagic anemia: Secondary | ICD-10-CM

## 2011-07-28 DIAGNOSIS — R197 Diarrhea, unspecified: Secondary | ICD-10-CM

## 2011-07-28 DIAGNOSIS — Z0000 Encounter for general adult medical examination without abnormal findings: Secondary | ICD-10-CM

## 2011-07-28 DIAGNOSIS — I10 Essential (primary) hypertension: Secondary | ICD-10-CM

## 2011-07-28 LAB — CBC WITH DIFFERENTIAL/PLATELET
Basophils Relative: 0.6 % (ref 0.0–3.0)
Eosinophils Absolute: 0.1 10*3/uL (ref 0.0–0.7)
HCT: 31.9 % — ABNORMAL LOW (ref 39.0–52.0)
Lymphs Abs: 0.9 10*3/uL (ref 0.7–4.0)
MCHC: 32.6 g/dL (ref 30.0–36.0)
MCV: 87.7 fl (ref 78.0–100.0)
Monocytes Absolute: 0.7 10*3/uL (ref 0.1–1.0)
Neutrophils Relative %: 74.1 % (ref 43.0–77.0)
RBC: 3.64 Mil/uL — ABNORMAL LOW (ref 4.22–5.81)

## 2011-07-28 MED ORDER — DIPHENOXYLATE-ATROPINE 2.5-0.025 MG PO TABS: 1.0000 | ORAL_TABLET | Freq: Four times a day (QID) | ORAL | Status: AC | PRN

## 2011-07-28 NOTE — Patient Instructions (Addendum)
Ok to stop the ativan and diovan Take all new medications as prescribed - the Lomotil only when going to for hyperbaric treatments Continue all other medications as before, including the Probiotic Please go to LAB in the Basement for the blood and stool test today Please plan on returning weekly for blood counts every Monday starting August 09, 2011

## 2011-07-28 NOTE — Telephone Encounter (Signed)
Family informed.

## 2011-07-29 ENCOUNTER — Telehealth: Payer: Self-pay

## 2011-07-29 ENCOUNTER — Other Ambulatory Visit: Payer: Medicare Other

## 2011-07-29 DIAGNOSIS — R197 Diarrhea, unspecified: Secondary | ICD-10-CM

## 2011-07-29 NOTE — Telephone Encounter (Signed)
Pharmacy clarification fax to inform that pacerone 200mg  prescribed from Dr. Antoine Poche and may interact with Celexa 10 mg please advise if ok to continue on both medications.

## 2011-07-29 NOTE — Telephone Encounter (Signed)
Ok for both for now

## 2011-07-30 ENCOUNTER — Telehealth: Payer: Self-pay

## 2011-07-30 LAB — CLOSTRIDIUM DIFFICILE EIA

## 2011-07-30 NOTE — Telephone Encounter (Signed)
Pharmacy informed.

## 2011-07-30 NOTE — Telephone Encounter (Signed)
[facility] called with a critical on the patient. C Diff Positive. Informed MD of critical report.  Per MD instructions called the patients wife informed of results.  Informed the wife to encourage patient to take medication prescribed as instructed, she stated she would do her best!!

## 2011-08-01 ENCOUNTER — Encounter: Payer: Self-pay | Admitting: Internal Medicine

## 2011-08-01 NOTE — Progress Notes (Signed)
Subjective:    Patient ID: [name], male    DOB: 01/15/1927, 78 y.o.   MRN: [id number]  HPI  Here for wellness and f/u with wife;  Recently hospd July 3-6 with diarrhea noted and ? Of c diff but appears test not actually done/no results on chart.  Overall doing ok but has some diarrhea persitent as well as some abd pain and nausea , and wife finding it very difficult to get him to actually take his meds, including the flagyl and probiotic, and pt very concerned about having accident while having the hyperbaric tx's which last about 2 hours;  Pt denies CP, worsening SOB, DOE, wheezing, orthopnea, PND, worsening LE edema, palpitations, dizziness or syncope.  Pt denies neurological change such as new Headache, facial or extremity weakness.  Pt denies polydipsia, polyuria, or low sugar symptoms. Pt states overall good med tolerability, and trying to follow lower cholesterol diet.  Pt denies worsening depressive symptoms, suicidal ideation or panic. No fever, wt loss, night sweats, loss of appetite, or other constitutional symptoms.  Pt states fair ability with ADL's, needs wife assist with most, at least mod fall risk, home safety reviewed and adequate, no significant changes in hearing or vision, and little to no exercise most days. Wife states no longer needs ativan, and BP has been persistently low, ? Need for diovan.  No overt bleeding or bruising, needs f/u CBC. Past Medical History  Diagnosis Date  . ADENOCARCINOMA, PROSTATE   . ALLERGIC RHINITIS   . B12 DEFICIENCY   . BENIGN PROSTATIC HYPERTROPHY   . CAROTID ARTERY DISEASE   . CEREBROVASCULAR ACCIDENT, HX OF   . CORONARY ARTERY DISEASE   . DIVERTICULOSIS, COLON   . GERD   . HEMORRHOIDS   . HIATAL HERNIA   . HYPERLIPIDEMIA   . HYPERTENSION   . OSTEOPENIA   . PEPTIC ULCER DISEASE   . PERIPHERAL VASCULAR DISEASE   . Personal history of alcoholism   . PROSTATE CANCER, HX OF   . PUD, HX OF   . TRANSIENT ISCHEMIC ATTACK   . Shingles     . Anemia   . AS (aortic stenosis)   . S/P AVR (aortic valve replacement) 09/23/2010  . Complication of anesthesia 09/23/2010    very confused after waking up. Stopped drinking 40 days before this surgery.  . Transfusion reaction, chill fever type 04/28/2011    Fever, rigors, tachycardia,tachypnea but no evidence of hemolysis  . Hemorrhagic cystitis 04/28/2011    Due to previous radiation for PSA recurrence of prostate cancer  . Blood transfusion   . Gross hematuria    Past Surgical History  Procedure Date  . Popliteal synovial cyst excision   . Right carotid endarterectomy 1999    Left carotid total chronic occlusion  . Prostate surgery   . Cardic stent 2004  . Aortic valve replacement 09/23/2010    #40mm [facility] Ease pericardial tissue valve  . Coronary artery bypass graft 09/23/2010    CABG x1 using LIMA to LAD  . Cystoscopy with biopsy 02/15/2011    Procedure: CYSTOSCOPY WITH BIOPSY;  Surgeon: Anner Crete, MD;  Location: [facility];  Service: Urology;  Laterality: N/A;  Cystoscopy, biopsy and fulguration of the bladder    reports that he quit smoking about 53 years ago. He has never used smokeless tobacco. He reports that he does not drink alcohol or use illicit drugs. family history includes Colon cancer (age of onset:62) in his mother; Coronary artery  disease in his sister; Diabetes in his sister; Hypertension in his sister; Multiple sclerosis in his sister; and Stroke in his brother. Allergies  Allergen Reactions  . Tolterodine Tartrate     Reaction=unknown   Current Outpatient Prescriptions on File Prior to Visit  Medication Sig Dispense Refill  . amiodarone (PACERONE) 200 MG tablet Take 200 mg by mouth 2 (two) times daily.      Marland Kitchen aspirin 81 MG tablet Take 81 mg by mouth daily.      Marland Kitchen atorvastatin (LIPITOR) 20 MG tablet Take 1 tablet (20 mg total) by mouth daily.  90 tablet  2  . citalopram (CELEXA) 10 MG tablet Take 1 tablet (10 mg total) by mouth daily.  90 tablet  3  .  feeding supplement (ENSURE COMPLETE) LIQD Take 237 mLs by mouth 3 (three) times daily between meals.      . ferrous sulfate (FERROUSUL) 325 (65 FE) MG tablet Take 1 tablet (325 mg total) by mouth 2 (two) times daily.  60 tablet  11  . Lactobacillus (ACIDOPHILUS PROBIOTIC) 100 MG CAPS Take 1 capsule (100 mg total) by mouth 3 (three) times daily with meals.  90 capsule  0  . levothyroxine (SYNTHROID, LEVOTHROID) 25 MCG tablet Take 1 tablet (25 mcg total) by mouth daily before breakfast.  30 tablet  0  . metoprolol succinate (TOPROL-XL) 25 MG 24 hr tablet Take 1 tablet (25 mg total) by mouth daily.  90 tablet  3  . metroNIDAZOLE (FLAGYL) 500 MG tablet Take 1 tablet (500 mg total) by mouth 3 (three) times daily.  42 tablet  0  . Multiple Vitamin (MULITIVITAMIN WITH MINERALS) TABS Take 1 tablet by mouth daily.      Marland Kitchen omeprazole (PRILOSEC) 40 MG capsule Take 40 mg by mouth daily.        . potassium chloride SA (K-DUR,KLOR-CON) 20 MEQ tablet Take 1 tablet (20 mEq total) by mouth daily.  90 tablet  3  . thiamine 100 MG tablet Take 1 tablet (100 mg total) by mouth daily.  90 tablet  3  . Vitamins A & D (VITAMIN A & D) cream Apply 1 application topically daily.       Review of Systems Review of Systems  Constitutional: Negative for diaphoresis, activity change, appetite change and unexpected weight change.  HENT: Negative for hearing loss, ear pain, facial swelling, mouth sores and neck stiffness.   Eyes: Negative for pain, redness and visual disturbance.  Respiratory: Negative for shortness of breath and wheezing.   Cardiovascular: Negative for chest pain and palpitations.  Gastrointestinal: Negative for  blood in stool, abdominal distention and rectal pain.  Genitourinary: Negative for hematuria, flank pain and decreased urine volume.  Musculoskeletal: Negative for myalgias and joint swelling.  Skin: Negative for color change and wound.  Neurological: Negative for syncope and numbness.    Hematological: Negative for adenopathy.  Psychiatric/Behavioral: Negative for hallucinations, self-injury, decreased concentration and agitation.     Objective:   Physical Exam BP 104/62  Pulse 89  Temp 97 F (36.1 C) (Oral)  Ht 5\' 11"  (1.803 m)  Wt 158 lb 8 oz (71.895 kg)  BMI 22.11 kg/m2  SpO2 92% Physical Exam  VS noted, chronically ill appearing Constitutional: Pt is oriented to person, place, and time. Appears well-developed and well-nourished.  Head: Normocephalic and atraumatic.  Right Ear: External ear normal.  Left Ear: External ear normal.  Nose: Nose normal.  Mouth/Throat: Oropharynx is clear and moist.  Eyes: Conjunctivae and  EOM are normal. Pupils are equal, round, and reactive to light.  Neck: Normal range of motion. Neck supple. No JVD present. No tracheal deviation present.  Cardiovascular: Normal rate, regular rhythm, normal heart sounds and intact distal pulses.   Pulmonary/Chest: Effort normal and breath sounds normal.  Abdominal: Soft. Bowel sounds are normal. There is mild diffuse tenderness. , non distended Musculoskeletal: Normal range of motion. Exhibits no edema.  Lymphadenopathy:  Has no cervical adenopathy.  Neurological: Pt is alert and oriented to person, place, and time. Pt has normal reflexes. No cranial nerve deficit.  Skin: Skin is warm and dry. No rash noted.  Psychiatric:  1+ nervous, not depressed affect    Assessment & Plan:

## 2011-08-01 NOTE — Assessment & Plan Note (Addendum)
For c diff toxin - cont flagyl asd and probiotic, for lomotil ONLY for hyperbaric tx time

## 2011-08-01 NOTE — Assessment & Plan Note (Signed)
For cbc f/u,  to f/u any worsening symptoms or concerns

## 2011-08-01 NOTE — Assessment & Plan Note (Signed)
With lower BP, ok to d/c diovan

## 2011-08-01 NOTE — Assessment & Plan Note (Signed)

## 2011-08-03 ENCOUNTER — Encounter: Payer: Medicare Other | Admitting: Internal Medicine

## 2011-08-09 ENCOUNTER — Encounter: Payer: Self-pay | Admitting: Internal Medicine

## 2011-08-09 ENCOUNTER — Other Ambulatory Visit (INDEPENDENT_AMBULATORY_CARE_PROVIDER_SITE_OTHER): Payer: Medicare Other

## 2011-08-09 DIAGNOSIS — Z125 Encounter for screening for malignant neoplasm of prostate: Secondary | ICD-10-CM

## 2011-08-09 DIAGNOSIS — Z0000 Encounter for general adult medical examination without abnormal findings: Secondary | ICD-10-CM

## 2011-08-09 DIAGNOSIS — Z79899 Other long term (current) drug therapy: Secondary | ICD-10-CM

## 2011-08-09 LAB — CBC WITH DIFFERENTIAL/PLATELET
Basophils Relative: 0.5 % (ref 0.0–3.0)
Eosinophils Relative: 0.9 % (ref 0.0–5.0)
HCT: 35.4 % — ABNORMAL LOW (ref 39.0–52.0)
Hemoglobin: 11.3 g/dL — ABNORMAL LOW (ref 13.0–17.0)
Lymphs Abs: 1 10*3/uL (ref 0.7–4.0)
MCV: 92.6 fl (ref 78.0–100.0)
Monocytes Absolute: 0.7 10*3/uL (ref 0.1–1.0)
Neutro Abs: 4.3 10*3/uL (ref 1.4–7.7)
Platelets: 245 10*3/uL (ref 150.0–400.0)
RBC: 3.82 Mil/uL — ABNORMAL LOW (ref 4.22–5.81)
WBC: 6 10*3/uL (ref 4.5–10.5)

## 2011-08-09 LAB — URINALYSIS, ROUTINE W REFLEX MICROSCOPIC

## 2011-08-09 LAB — HEPATIC FUNCTION PANEL
ALT: 16 U/L (ref 0–53)
Albumin: 3.2 g/dL — ABNORMAL LOW (ref 3.5–5.2)
Total Protein: 6.1 g/dL (ref 6.0–8.3)

## 2011-08-09 LAB — LIPID PANEL
Cholesterol: 119 mg/dL (ref 0–200)
Triglycerides: 160 mg/dL — ABNORMAL HIGH (ref 0.0–149.0)

## 2011-08-09 LAB — PSA: PSA: 0 ng/mL — ABNORMAL LOW (ref 0.10–4.00)

## 2011-08-16 ENCOUNTER — Other Ambulatory Visit (INDEPENDENT_AMBULATORY_CARE_PROVIDER_SITE_OTHER): Payer: Medicare Other

## 2011-08-16 DIAGNOSIS — D62 Acute posthemorrhagic anemia: Secondary | ICD-10-CM

## 2011-08-16 DIAGNOSIS — R197 Diarrhea, unspecified: Secondary | ICD-10-CM

## 2011-08-16 LAB — CBC WITH DIFFERENTIAL/PLATELET
Basophils Absolute: 0 10*3/uL (ref 0.0–0.1)
Basophils Relative: 0.3 % (ref 0.0–3.0)
Eosinophils Absolute: 0.1 10*3/uL (ref 0.0–0.7)
HCT: 32.3 % — ABNORMAL LOW (ref 39.0–52.0)
Hemoglobin: 10.6 g/dL — ABNORMAL LOW (ref 13.0–17.0)
Lymphs Abs: 0.8 10*3/uL (ref 0.7–4.0)
MCHC: 33 g/dL (ref 30.0–36.0)
Monocytes Relative: 9.5 % (ref 3.0–12.0)
Neutro Abs: 9.9 10*3/uL — ABNORMAL HIGH (ref 1.4–7.7)
RDW: 21.3 % — ABNORMAL HIGH (ref 11.5–14.6)

## 2011-08-17 ENCOUNTER — Telehealth: Payer: Self-pay

## 2011-08-17 DIAGNOSIS — D62 Acute posthemorrhagic anemia: Secondary | ICD-10-CM

## 2011-08-17 NOTE — Telephone Encounter (Signed)
Put order in for CBC 

## 2011-08-19 ENCOUNTER — Encounter (HOSPITAL_BASED_OUTPATIENT_CLINIC_OR_DEPARTMENT_OTHER): Payer: Medicare Other | Attending: Internal Medicine

## 2011-08-19 ENCOUNTER — Telehealth: Payer: Self-pay | Admitting: Internal Medicine

## 2011-08-19 DIAGNOSIS — Z8551 Personal history of malignant neoplasm of bladder: Secondary | ICD-10-CM | POA: Insufficient documentation

## 2011-08-19 DIAGNOSIS — T66XXXS Radiation sickness, unspecified, sequela: Secondary | ICD-10-CM | POA: Insufficient documentation

## 2011-08-19 DIAGNOSIS — N3040 Irradiation cystitis without hematuria: Secondary | ICD-10-CM | POA: Insufficient documentation

## 2011-08-19 DIAGNOSIS — Z8546 Personal history of malignant neoplasm of prostate: Secondary | ICD-10-CM | POA: Insufficient documentation

## 2011-08-19 DIAGNOSIS — Y842 Radiological procedure and radiotherapy as the cause of abnormal reaction of the patient, or of later complication, without mention of misadventure at the time of the procedure: Secondary | ICD-10-CM | POA: Insufficient documentation

## 2011-08-19 NOTE — Telephone Encounter (Signed)
Spoke with wife, gave several appt options that she will discuss with the patient and call back; wife is concerned about the patient seeing someone other than Dr. [name]

## 2011-08-19 NOTE — Telephone Encounter (Signed)
Schedule pt 08/24/2011 per wife pt wants to wait, will call back if he gets worse or needs to be seen sooner

## 2011-08-19 NOTE — Telephone Encounter (Signed)
Caller: Melissa Noon; PCP: Oliver Barre; CB#: [phone]. Call regarding Info for MD; Caller would like a message sent for Dr [name]. Caller reports this gentleman has been weak and has had diarrhea all week. He has been taking the "little white pills" as directed. He has been unable to go for his Hyperbaric Treatments this week due to diarrhea, missing Monday 7/29 and Thurs 8/1. Caller would like callback from PCP with direction.

## 2011-08-19 NOTE — Telephone Encounter (Signed)
Please offer ov

## 2011-08-19 NOTE — Telephone Encounter (Signed)
Please offer appointment per Dr. Everardo All. Thanks.

## 2011-08-23 ENCOUNTER — Encounter (HOSPITAL_BASED_OUTPATIENT_CLINIC_OR_DEPARTMENT_OTHER): Payer: Medicare Other

## 2011-08-23 ENCOUNTER — Telehealth: Payer: Self-pay | Admitting: Internal Medicine

## 2011-08-23 NOTE — Telephone Encounter (Signed)
Caller: Melissa Noon; PCP: Oliver Barre; CB#: [phone]; Call regarding Medication Question; He is on medication for diarrhea that indicates he can take med 4 times a day and she asks if he can take more doses than prescribed as his diarrhea is not responding to the 4 doses per day.   He is on treatments in hyperbaric chamber and is not able to go to his appointment because of diarrhea.  Diarrhea "quite some time", getting worse since 08/19/11.  Last intake approximately 1400.  Has catheter with bloody urine due to radiation.  Increasing dizziness reported.  Advised Emergency Department due to dry mouth and dizziness, immunocompromised patient; office is closed.   Caller agreed but states patient will not cooperate with that.  Reinforced need for him to go to Emergency Department with rationale.

## 2011-08-24 ENCOUNTER — Ambulatory Visit (INDEPENDENT_AMBULATORY_CARE_PROVIDER_SITE_OTHER): Payer: Medicare Other | Admitting: Internal Medicine

## 2011-08-24 ENCOUNTER — Other Ambulatory Visit (INDEPENDENT_AMBULATORY_CARE_PROVIDER_SITE_OTHER): Payer: Medicare Other

## 2011-08-24 ENCOUNTER — Encounter: Payer: Self-pay | Admitting: Internal Medicine

## 2011-08-24 ENCOUNTER — Telehealth: Payer: Self-pay | Admitting: Internal Medicine

## 2011-08-24 ENCOUNTER — Telehealth: Payer: Self-pay | Admitting: *Deleted

## 2011-08-24 VITALS — BP 112/62 | HR 76 | Temp 97.6°F | Ht 71.0 in | Wt 146.0 lb

## 2011-08-24 DIAGNOSIS — R310 Gross hematuria: Secondary | ICD-10-CM

## 2011-08-24 DIAGNOSIS — R634 Abnormal weight loss: Secondary | ICD-10-CM

## 2011-08-24 DIAGNOSIS — I10 Essential (primary) hypertension: Secondary | ICD-10-CM

## 2011-08-24 DIAGNOSIS — R109 Unspecified abdominal pain: Secondary | ICD-10-CM

## 2011-08-24 DIAGNOSIS — R197 Diarrhea, unspecified: Secondary | ICD-10-CM

## 2011-08-24 DIAGNOSIS — R110 Nausea: Secondary | ICD-10-CM

## 2011-08-24 LAB — CBC WITH DIFFERENTIAL/PLATELET
Basophils Absolute: 0 10*3/uL (ref 0.0–0.1)
Eosinophils Absolute: 0 10*3/uL (ref 0.0–0.7)
Lymphocytes Relative: 2 % — ABNORMAL LOW (ref 12.0–46.0)
Monocytes Relative: 4.9 % (ref 3.0–12.0)
Platelets: 300 10*3/uL (ref 150.0–400.0)
RDW: 19.3 % — ABNORMAL HIGH (ref 11.5–14.6)

## 2011-08-24 LAB — BASIC METABOLIC PANEL
CO2: 25 mEq/L (ref 19–32)
Chloride: 97 mEq/L (ref 96–112)
Glucose, Bld: 104 mg/dL — ABNORMAL HIGH (ref 70–99)
Potassium: 4.8 mEq/L (ref 3.5–5.1)
Sodium: 131 mEq/L — ABNORMAL LOW (ref 135–145)

## 2011-08-24 LAB — HEPATIC FUNCTION PANEL
ALT: 28 U/L (ref 0–53)
AST: 48 U/L — ABNORMAL HIGH (ref 0–37)
Albumin: 2.7 g/dL — ABNORMAL LOW (ref 3.5–5.2)
Alkaline Phosphatase: 91 U/L (ref 39–117)
Total Protein: 6 g/dL (ref 6.0–8.3)

## 2011-08-24 LAB — LIPASE: Lipase: 20 U/L (ref 11.0–59.0)

## 2011-08-24 MED ORDER — METRONIDAZOLE 500 MG PO TABS: 500.0000 mg | ORAL_TABLET | Freq: Three times a day (TID) | ORAL | Status: DC

## 2011-08-24 MED ORDER — LEVOTHYROXINE SODIUM 25 MCG PO TABS: 25.0000 ug | ORAL_TABLET | Freq: Every day | ORAL | Status: DC

## 2011-08-24 NOTE — Telephone Encounter (Signed)
Left a message for [facility] with appointment with Willette Cluster, NP on 08/25/11 at 2:30 PM.

## 2011-08-24 NOTE — Patient Instructions (Addendum)
OK to take the lomotil on more regular basis, up to maximum 8 tabs per day Please drink more fluids The refill of the thyroid medication was done today You will be contacted regarding the referral for: Gastroenterology Please keep your appointments with your specialists as you have planned Please go to LAB in the Basement for the blood and/or urine tests to be done today You will be contacted by phone if any changes need to be made immediately.  Otherwise, you will receive a letter about your results with an explanation. Please return in 4 weeks, or sooner if needed

## 2011-08-24 NOTE — Telephone Encounter (Signed)
Per Clydie Braun in Lab-WBC is [phone]. MD notified verbally as well.

## 2011-08-24 NOTE — Telephone Encounter (Signed)
Called the patients wife informed of results and medication to the pharmacy.

## 2011-08-24 NOTE — Progress Notes (Signed)
Subjective:    Patient ID: [name], male    DOB: 02-28-1926, 78 y.o.   MRN: [id number]  HPI    Here with wife who remains supportive;  Has had decreased appetite, worsening general weakness and mild increased loose and watery stool without blood for several days;  No vomiting or abd pain (chronic nausea no change), back pain, fever, chills and appears brighter today than last visit.   Finished the antibx for C diff, no other antibx since.    Cont's to have chronic hematuria as well;    Now s/p 22 hyperbaric tx but tends to miss 2-3 per wk, and it has been suggested he might need to re-start the process as does not seem to be working.  Although he has not had any stool incontinence/accidents during the tx and the lomotil seems to help, he is still too afraid to go for all of the treatments.  Has had recent anemia requiring transfusion related to the hematuria, and due for cbc today as well.  Pt denies chest pain, increased sob or doe, wheezing, orthopnea, PND, increased LE swelling, palpitations, or syncope.  Pt denies new neurological symptoms such as new headache, or facial or extremity weakness or numbness   Pt denies polydipsia, polyuria. Denies worsening reflux, dysphagia, abd pain,  or blood.  Wt is down from 158 to 146 Past Medical History  Diagnosis Date  . ADENOCARCINOMA, PROSTATE   . ALLERGIC RHINITIS   . B12 DEFICIENCY   . BENIGN PROSTATIC HYPERTROPHY   . CAROTID ARTERY DISEASE   . CEREBROVASCULAR ACCIDENT, HX OF   . CORONARY ARTERY DISEASE   . DIVERTICULOSIS, COLON   . GERD   . HEMORRHOIDS   . HIATAL HERNIA   . HYPERLIPIDEMIA   . HYPERTENSION   . OSTEOPENIA   . PEPTIC ULCER DISEASE   . PERIPHERAL VASCULAR DISEASE   . Personal history of alcoholism   . PROSTATE CANCER, HX OF   . PUD, HX OF   . TRANSIENT ISCHEMIC ATTACK   . Shingles   . Anemia   . AS (aortic stenosis)   . S/P AVR (aortic valve replacement) 09/23/2010  . Complication of anesthesia 09/23/2010    very confused  after waking up. Stopped drinking 40 days before this surgery.  . Transfusion reaction, chill fever type 04/28/2011    Fever, rigors, tachycardia,tachypnea but no evidence of hemolysis  . Hemorrhagic cystitis 04/28/2011    Due to previous radiation for PSA recurrence of prostate cancer  . Blood transfusion   . Gross hematuria   . Stroke   . CVA (cerebrovascular accident) 08/29/2011    08/28/11   Past Surgical History  Procedure Date  . Popliteal synovial cyst excision   . Right carotid endarterectomy 1999    Left carotid total chronic occlusion  . Prostate surgery   . Cardic stent 2004  . Aortic valve replacement 09/23/2010    #40mm [facility] Ease pericardial tissue valve  . Coronary artery bypass graft 09/23/2010    CABG x1 using LIMA to LAD  . Cystoscopy with biopsy 02/15/2011    Procedure: CYSTOSCOPY WITH BIOPSY;  Surgeon: Anner Crete, MD;  Location: [facility];  Service: Urology;  Laterality: N/A;  Cystoscopy, biopsy and fulguration of the bladder    reports that he quit smoking about 53 years ago. He has never used smokeless tobacco. He reports that he does not drink alcohol or use illicit drugs. family history includes Colon cancer (age of onset:62)  in his mother; Coronary artery disease in his sister; Diabetes in his sister; Hypertension in his sister; Multiple sclerosis in his sister; and Stroke in his brother. Allergies  Allergen Reactions  . Tolterodine Tartrate     Reaction=unknown   No current facility-administered medications on file prior to visit.   Current Outpatient Prescriptions on File Prior to Visit  Medication Sig Dispense Refill  . amiodarone (PACERONE) 200 MG tablet Take 200 mg by mouth 2 (two) times daily.      Marland Kitchen aspirin 81 MG tablet Take 81 mg by mouth daily.      Marland Kitchen atorvastatin (LIPITOR) 20 MG tablet Take 1 tablet (20 mg total) by mouth daily.  90 tablet  2  . citalopram (CELEXA) 10 MG tablet Take 1 tablet (10 mg total) by mouth daily.  90 tablet  3  .  feeding supplement (ENSURE COMPLETE) LIQD Take 237 mLs by mouth 3 (three) times daily between meals.      . ferrous sulfate (FERROUSUL) 325 (65 FE) MG tablet Take 1 tablet (325 mg total) by mouth 2 (two) times daily.  60 tablet  11  . Lactobacillus (ACIDOPHILUS PROBIOTIC) 100 MG CAPS Take 1 capsule (100 mg total) by mouth 3 (three) times daily with meals.  90 capsule  0  . levothyroxine (SYNTHROID, LEVOTHROID) 25 MCG tablet Take 1 tablet (25 mcg total) by mouth daily before breakfast.  90 tablet  3  . metoprolol succinate (TOPROL-XL) 25 MG 24 hr tablet Take 1 tablet (25 mg total) by mouth daily.  90 tablet  3  . Multiple Vitamin (MULITIVITAMIN WITH MINERALS) TABS Take 1 tablet by mouth daily.      Marland Kitchen omeprazole (PRILOSEC) 40 MG capsule Take 40 mg by mouth daily.        . potassium chloride SA (K-DUR,KLOR-CON) 20 MEQ tablet Take 1 tablet (20 mEq total) by mouth daily.  90 tablet  3  . thiamine 100 MG tablet Take 1 tablet (100 mg total) by mouth daily.  90 tablet  3  . Vitamins A & D (VITAMIN A & D) cream Apply 1 application topically daily.       Review of Systems Constitutional: Negative for diaphoresis   HENT: Negative for drooling and tinnitus.   Eyes: Negative for photophobia and visual disturbance.  Respiratory: Negative for choking and stridor.   Gastrointestinal: Negative for vomiting and blood in stool.   Musculoskeletal: Negative for acute joint swelling Skin: Negative for color change and wound.  Neurological: Negative for tremors and numbness.    Objective:   Physical Exam BP 112/62  Pulse 76  Temp 97.6 F (36.4 C) (Oral)  Ht 5\' 11"  (1.803 m)  Wt 146 lb (66.225 kg)  BMI 20.36 kg/m2  SpO2 97% Physical Exam  VS noted, frail, in wheelchair, bright, alert Constitutional: Pt appears well-developed and well-nourished.  HENT: Head: Normocephalic.  Right Ear: External ear normal.  Left Ear: External ear normal.  Eyes: Conjunctivae and EOM are normal. Pupils are equal, round, and  reactive to light.  Neck: Normal range of motion. Neck supple.  Cardiovascular: Normal rate and regular rhythm.   Pulmonary/Chest: Effort normal and breath sounds normal.  Abd:  Soft, NT, non-distended, + BS - benign Neurological: Pt is alert.  Skin: Skin is warm. No erythema.  Psychiatric: Pt behavior is normal. Thought content normal.     Assessment & Plan:

## 2011-08-24 NOTE — Telephone Encounter (Signed)
Pt in NAD today and benign abd exam, but with persistent nonbloody diarrhea, afeb but wbc severe elev;  Ok for Flagyl for presumed recurring c diff colitis- done erx  Robin to inform wife; not sure about his hgb (she will ask)

## 2011-08-25 ENCOUNTER — Ambulatory Visit: Payer: Medicare Other | Admitting: Nurse Practitioner

## 2011-08-25 ENCOUNTER — Encounter (HOSPITAL_COMMUNITY): Payer: Self-pay

## 2011-08-25 ENCOUNTER — Inpatient Hospital Stay (HOSPITAL_COMMUNITY)
Admission: EM | Admit: 2011-08-25 | Discharge: 2011-09-02 | DRG: 371 | Disposition: A | Discharge status: Home Health | Payer: Medicare Other | Attending: Internal Medicine | Admitting: Internal Medicine

## 2011-08-25 DIAGNOSIS — A0472 Enterocolitis due to Clostridium difficile, not specified as recurrent: Principal | ICD-10-CM | POA: Diagnosis present

## 2011-08-25 DIAGNOSIS — Z8673 Personal history of transient ischemic attack (TIA), and cerebral infarction without residual deficits: Secondary | ICD-10-CM

## 2011-08-25 DIAGNOSIS — I739 Peripheral vascular disease, unspecified: Secondary | ICD-10-CM | POA: Diagnosis present

## 2011-08-25 DIAGNOSIS — R197 Diarrhea, unspecified: Secondary | ICD-10-CM | POA: Diagnosis present

## 2011-08-25 DIAGNOSIS — E876 Hypokalemia: Secondary | ICD-10-CM | POA: Clinically undetermined

## 2011-08-25 DIAGNOSIS — Z8679 Personal history of other diseases of the circulatory system: Secondary | ICD-10-CM

## 2011-08-25 DIAGNOSIS — D72829 Elevated white blood cell count, unspecified: Secondary | ICD-10-CM | POA: Diagnosis present

## 2011-08-25 DIAGNOSIS — I6350 Cerebral infarction due to unspecified occlusion or stenosis of unspecified cerebral artery: Secondary | ICD-10-CM | POA: Diagnosis present

## 2011-08-25 DIAGNOSIS — I10 Essential (primary) hypertension: Secondary | ICD-10-CM | POA: Diagnosis present

## 2011-08-25 DIAGNOSIS — Z8546 Personal history of malignant neoplasm of prostate: Secondary | ICD-10-CM

## 2011-08-25 DIAGNOSIS — B9620 Unspecified Escherichia coli [E. coli] as the cause of diseases classified elsewhere: Secondary | ICD-10-CM | POA: Diagnosis present

## 2011-08-25 DIAGNOSIS — I2510 Atherosclerotic heart disease of native coronary artery without angina pectoris: Secondary | ICD-10-CM | POA: Diagnosis present

## 2011-08-25 DIAGNOSIS — E039 Hypothyroidism, unspecified: Secondary | ICD-10-CM | POA: Diagnosis present

## 2011-08-25 DIAGNOSIS — B961 Klebsiella pneumoniae [K. pneumoniae] as the cause of diseases classified elsewhere: Secondary | ICD-10-CM | POA: Diagnosis present

## 2011-08-25 DIAGNOSIS — A498 Other bacterial infections of unspecified site: Secondary | ICD-10-CM | POA: Diagnosis present

## 2011-08-25 DIAGNOSIS — I639 Cerebral infarction, unspecified: Secondary | ICD-10-CM | POA: Diagnosis not present

## 2011-08-25 DIAGNOSIS — K219 Gastro-esophageal reflux disease without esophagitis: Secondary | ICD-10-CM | POA: Diagnosis present

## 2011-08-25 DIAGNOSIS — IMO0002 Reserved for concepts with insufficient information to code with codable children: Secondary | ICD-10-CM

## 2011-08-25 DIAGNOSIS — N390 Urinary tract infection, site not specified: Secondary | ICD-10-CM | POA: Diagnosis present

## 2011-08-25 DIAGNOSIS — I4891 Unspecified atrial fibrillation: Secondary | ICD-10-CM | POA: Diagnosis present

## 2011-08-25 DIAGNOSIS — E871 Hypo-osmolality and hyponatremia: Secondary | ICD-10-CM | POA: Diagnosis present

## 2011-08-25 DIAGNOSIS — K651 Peritoneal abscess: Secondary | ICD-10-CM | POA: Diagnosis present

## 2011-08-25 DIAGNOSIS — Z79899 Other long term (current) drug therapy: Secondary | ICD-10-CM

## 2011-08-25 DIAGNOSIS — E43 Unspecified severe protein-calorie malnutrition: Secondary | ICD-10-CM | POA: Diagnosis present

## 2011-08-25 DIAGNOSIS — Z87898 Personal history of other specified conditions: Secondary | ICD-10-CM

## 2011-08-25 DIAGNOSIS — Z8711 Personal history of peptic ulcer disease: Secondary | ICD-10-CM

## 2011-08-25 HISTORY — DX: Cerebral infarction, unspecified: I63.9

## 2011-08-25 MED ORDER — SODIUM CHLORIDE 0.9 % IV SOLN
Freq: Once | INTRAVENOUS | Status: AC
  Administered 2011-08-26: 01:00:00 via INTRAVENOUS

## 2011-08-25 NOTE — ED Notes (Signed)
Per EMS: pt coming from home with c/o of n/v/d and weakness. Pt did not want to come but wife insisted. Pt reports pain in his lower abdomen only when he has a bowel movement. Pt reports emesis is clear, pt has not been able to hold anything down and he reports having a BM every 15 minutes. Pt is A&O

## 2011-08-25 NOTE — ED Notes (Signed)
New and old EKG done and given to Dr. Judd Lien

## 2011-08-25 NOTE — ED Notes (Signed)
FIE:PP29<JJ> Expected date:08/25/11<BR> Expected time: 9:11 PM<BR> Means of arrival:<BR> Comments:<BR> EMS Hold per Misty Stanley

## 2011-08-25 NOTE — ED Provider Notes (Signed)
History     CSN: [id number]  Arrival date & time 08/25/11  2122   First MD Initiated Contact with Patient 08/25/11 2325      Chief Complaint  Patient presents with  . Nausea  . Emesis  . Diarrhea  . Weakness    (Consider location/radiation/quality/duration/timing/severity/associated sxs/prior treatment) HPI Comments: Patient with extensive pmh including prostate cancer with bladder damage secondary to radiation.  Has chronic indwelling foley catheter.  Started several days ago with n/v/d and weakness.  Denies any blood.  No fevers or chills.    Patient is a 78 y.o. male presenting with vomiting. The history is provided by the patient.  Emesis  This is a new problem. The problem occurs continuously. The problem has been gradually worsening. There has been no fever. Associated symptoms include abdominal pain, chills and diarrhea. Pertinent negatives include no fever.    Past Medical History  Diagnosis Date  . ADENOCARCINOMA, PROSTATE   . ALLERGIC RHINITIS   . B12 DEFICIENCY   . BENIGN PROSTATIC HYPERTROPHY   . CAROTID ARTERY DISEASE   . CEREBROVASCULAR ACCIDENT, HX OF   . CORONARY ARTERY DISEASE   . DIVERTICULOSIS, COLON   . GERD   . HEMORRHOIDS   . HIATAL HERNIA   . HYPERLIPIDEMIA   . HYPERTENSION   . OSTEOPENIA   . PEPTIC ULCER DISEASE   . PERIPHERAL VASCULAR DISEASE   . Personal history of alcoholism   . PROSTATE CANCER, HX OF   . PUD, HX OF   . TRANSIENT ISCHEMIC ATTACK   . Shingles   . Anemia   . AS (aortic stenosis)   . S/P AVR (aortic valve replacement) 09/23/2010  . Complication of anesthesia 09/23/2010    very confused after waking up. Stopped drinking 40 days before this surgery.  . Transfusion reaction, chill fever type 04/28/2011    Fever, rigors, tachycardia,tachypnea but no evidence of hemolysis  . Hemorrhagic cystitis 04/28/2011    Due to previous radiation for PSA recurrence of prostate cancer  . Blood transfusion   . Gross hematuria     Past  Surgical History  Procedure Date  . Popliteal synovial cyst excision   . Right carotid endarterectomy 1999    Left carotid total chronic occlusion  . Prostate surgery   . Cardic stent 2004  . Aortic valve replacement 09/23/2010    #9mm [facility] Ease pericardial tissue valve  . Coronary artery bypass graft 09/23/2010    CABG x1 using LIMA to LAD  . Cystoscopy with biopsy 02/15/2011    Procedure: CYSTOSCOPY WITH BIOPSY;  Surgeon: Anner Crete, MD;  Location: [facility];  Service: Urology;  Laterality: N/A;  Cystoscopy, biopsy and fulguration of the bladder    Family History  Problem Relation Age of Onset  . Colon cancer Mother 22  . Diabetes Sister   . Hypertension Sister   . Coronary artery disease Sister   . Multiple sclerosis Sister   . Stroke Brother     History  Substance Use Topics  . Smoking status: Former Smoker    Quit date: 02/14/1958  . Smokeless tobacco: Never Used  . Alcohol Use: No     last drink april 2013      Review of Systems  Constitutional: Positive for chills and fatigue. Negative for fever.  Cardiovascular: Negative for chest pain and palpitations.  Gastrointestinal: Positive for nausea, vomiting, abdominal pain and diarrhea. Negative for blood in stool and abdominal distention.  All other systems reviewed and  are negative.    Allergies  Tolterodine tartrate  Home Medications   Current Outpatient Rx  Name Route Sig Dispense Refill  . AMIODARONE HCL 200 MG PO TABS Oral Take 200 mg by mouth 2 (two) times daily.    . ASPIRIN 81 MG PO TABS Oral Take 81 mg by mouth daily.    . ATORVASTATIN CALCIUM 20 MG PO TABS Oral Take 1 tablet (20 mg total) by mouth daily. 90 tablet 2  . CITALOPRAM HYDROBROMIDE 10 MG PO TABS Oral Take 1 tablet (10 mg total) by mouth daily. 90 tablet 3  . ENSURE COMPLETE PO LIQD Oral Take 237 mLs by mouth 3 (three) times daily between meals.    Di Kindle SULFATE 325 (65 FE) MG PO TABS Oral Take 1 tablet (325 mg total) by mouth  2 (two) times daily. 60 tablet 11  . ACIDOPHILUS PROBIOTIC 100 MG PO CAPS Oral Take 1 capsule (100 mg total) by mouth 3 (three) times daily with meals. 90 capsule 0  . LEVOTHYROXINE SODIUM 25 MCG PO TABS Oral Take 1 tablet (25 mcg total) by mouth daily before breakfast. 90 tablet 3  . METOPROLOL SUCCINATE ER 25 MG PO TB24 Oral Take 1 tablet (25 mg total) by mouth daily. 90 tablet 3  . ADULT MULTIVITAMIN W/MINERALS CH Oral Take 1 tablet by mouth daily.    Marland Kitchen OMEPRAZOLE 40 MG PO CPDR Oral Take 40 mg by mouth daily.      Marland Kitchen POTASSIUM CHLORIDE CRYS ER 20 MEQ PO TBCR Oral Take 1 tablet (20 mEq total) by mouth daily. 90 tablet 3  . THIAMINE HCL 100 MG PO TABS Oral Take 1 tablet (100 mg total) by mouth daily. 90 tablet 3  . SWEEN EX CREA Topical Apply 1 application topically daily.    Marland Kitchen METRONIDAZOLE 500 MG PO TABS Oral Take 1 tablet (500 mg total) by mouth 3 (three) times daily. 30 tablet 0    BP 128/89  Pulse 108  Temp 97.5 F (36.4 C) (Oral)  Resp 16  SpO2 100%  Physical Exam  Nursing note and vitals reviewed. Constitutional: He is oriented to person, place, and time.       Patient appears quite pale, but in no acute distress.  HENT:  Head: Normocephalic and atraumatic.  Mouth/Throat: Oropharynx is clear and moist.       Mucous membranes are moist.  Neck: Normal range of motion. Neck supple.  Cardiovascular: Normal rate and regular rhythm.   No murmur heard. Pulmonary/Chest: Effort normal and breath sounds normal. No respiratory distress. He has no wheezes.  Abdominal: Soft. Bowel sounds are normal. He exhibits no distension. There is no tenderness.  Musculoskeletal: Normal range of motion. He exhibits no edema.  Neurological: He is alert and oriented to person, place, and time.  Skin: There is pallor.    ED Course  Procedures (including critical care time)  Labs Reviewed - No data to display No results found.   No diagnosis found.   Date: 08/27/2011  Rate: 108  Rhythm:  sinus tachycardia  QRS Axis: normal  Intervals: normal  ST/T Wave abnormalities: nonspecific T wave changes  Conduction Disutrbances:none  Narrative Interpretation:   Old EKG Reviewed: unchanged    MDM  The patient presents with weakness, fatigue.  He has an indwelling foley catheter and suspect infection may be the cause.  Workup reveals a wbc of 35k.  Blood cultures and chest xray were obtained at the request of medicine.  He was  give ceftriaxone for presumptive urinary source and will be admitted to the internal medicine service.  The abdomen is other wise benign and no complaints or findings to help explain the leukocytosis.        Geoffery Lyons, MD 08/27/11 [id number]

## 2011-08-26 ENCOUNTER — Encounter (HOSPITAL_COMMUNITY): Payer: Self-pay | Admitting: Internal Medicine

## 2011-08-26 ENCOUNTER — Telehealth: Payer: Self-pay | Admitting: Internal Medicine

## 2011-08-26 ENCOUNTER — Inpatient Hospital Stay (HOSPITAL_COMMUNITY): Payer: Medicare Other

## 2011-08-26 ENCOUNTER — Emergency Department (HOSPITAL_COMMUNITY): Payer: Medicare Other

## 2011-08-26 DIAGNOSIS — E871 Hypo-osmolality and hyponatremia: Secondary | ICD-10-CM

## 2011-08-26 DIAGNOSIS — D72829 Elevated white blood cell count, unspecified: Secondary | ICD-10-CM | POA: Diagnosis present

## 2011-08-26 DIAGNOSIS — R197 Diarrhea, unspecified: Secondary | ICD-10-CM

## 2011-08-26 DIAGNOSIS — I4891 Unspecified atrial fibrillation: Secondary | ICD-10-CM

## 2011-08-26 LAB — CBC WITH DIFFERENTIAL/PLATELET
Basophils Relative: 0 % (ref 0–1)
Eosinophils Absolute: 0 10*3/uL (ref 0.0–0.7)
Eosinophils Relative: 0 % (ref 0–5)
HCT: 35.6 % — ABNORMAL LOW (ref 39.0–52.0)
Hemoglobin: 12 g/dL — ABNORMAL LOW (ref 13.0–17.0)
Lymphocytes Relative: 3 % — ABNORMAL LOW (ref 12–46)
MCH: 29.5 pg (ref 26.0–34.0)
MCHC: 33.7 g/dL (ref 30.0–36.0)
Neutro Abs: 32.2 10*3/uL — ABNORMAL HIGH (ref 1.7–7.7)
Neutrophils Relative %: 92 % — ABNORMAL HIGH (ref 43–77)
RBC: 4.07 MIL/uL — ABNORMAL LOW (ref 4.22–5.81)

## 2011-08-26 LAB — COMPREHENSIVE METABOLIC PANEL
ALT: 35 U/L (ref 0–53)
ALT: 31 U/L (ref 0–53)
AST: 60 U/L — ABNORMAL HIGH (ref 0–37)
AST: 53 U/L — ABNORMAL HIGH (ref 0–37)
Alkaline Phosphatase: 129 U/L — ABNORMAL HIGH (ref 39–117)
Alkaline Phosphatase: 100 U/L (ref 39–117)
CO2: 19 mEq/L (ref 19–32)
CO2: 20 mEq/L (ref 19–32)
Calcium: 8.8 mg/dL (ref 8.4–10.5)
Chloride: 93 mEq/L — ABNORMAL LOW (ref 96–112)
GFR calc Af Amer: 69 mL/min — ABNORMAL LOW (ref >90)
GFR calc Af Amer: 61 mL/min — ABNORMAL LOW (ref >90)
GFR calc non Af Amer: 59 mL/min — ABNORMAL LOW (ref >90)
GFR calc non Af Amer: 53 mL/min — ABNORMAL LOW (ref >90)
Glucose, Bld: 131 mg/dL — ABNORMAL HIGH (ref 70–99)
Glucose, Bld: 116 mg/dL — ABNORMAL HIGH (ref 70–99)
Potassium: 4.1 mEq/L (ref 3.5–5.1)
Sodium: 128 mEq/L — ABNORMAL LOW (ref 135–145)
Sodium: 129 mEq/L — ABNORMAL LOW (ref 135–145)
Total Bilirubin: 0.5 mg/dL (ref 0.3–1.2)
Total Protein: 5.3 g/dL — ABNORMAL LOW (ref 6.0–8.3)

## 2011-08-26 LAB — CBC
HCT: 32 % — ABNORMAL LOW (ref 39.0–52.0)
MCH: 29.3 pg (ref 26.0–34.0)
MCHC: 33.8 g/dL (ref 30.0–36.0)
RDW: 17.2 % — ABNORMAL HIGH (ref 11.5–15.5)

## 2011-08-26 LAB — URINALYSIS, ROUTINE W REFLEX MICROSCOPIC
Glucose, UA: NEGATIVE mg/dL
Protein, ur: 100 mg/dL — AB
Urobilinogen, UA: 1 mg/dL (ref 0.0–1.0)

## 2011-08-26 LAB — TSH: TSH: 4.058 u[IU]/mL (ref 0.350–4.500)

## 2011-08-26 LAB — URINE MICROSCOPIC-ADD ON

## 2011-08-26 LAB — CLOSTRIDIUM DIFFICILE BY PCR: Toxigenic C. Difficile by PCR: POSITIVE — AB

## 2011-08-26 MED ORDER — AMIODARONE HCL 200 MG PO TABS
200.0000 mg | ORAL_TABLET | Freq: Two times a day (BID) | ORAL | Status: DC
  Administered 2011-08-26 – 2011-09-02 (×14): 200 mg via ORAL
  Filled 2011-08-26 (×16): qty 1

## 2011-08-26 MED ORDER — SODIUM CHLORIDE 0.9 % IV SOLN
INTRAVENOUS | Status: AC
  Administered 2011-08-26: 75 mL/h via INTRAVENOUS
  Administered 2011-08-26: 07:00:00 via INTRAVENOUS

## 2011-08-26 MED ORDER — ACETAMINOPHEN 325 MG PO TABS: 650.0000 mg | ORAL_TABLET | Freq: Four times a day (QID) | ORAL | Status: DC | PRN

## 2011-08-26 MED ORDER — ENSURE COMPLETE PO LIQD
237.0000 mL | Freq: Three times a day (TID) | ORAL | Status: DC
  Administered 2011-08-29 – 2011-09-02 (×5): 237 mL via ORAL

## 2011-08-26 MED ORDER — ONDANSETRON HCL 4 MG PO TABS
4.0000 mg | ORAL_TABLET | Freq: Four times a day (QID) | ORAL | Status: DC | PRN
  Filled 2011-08-26: qty 1

## 2011-08-26 MED ORDER — PANTOPRAZOLE SODIUM 40 MG PO TBEC
40.0000 mg | DELAYED_RELEASE_TABLET | Freq: Every day | ORAL | Status: DC
  Administered 2011-08-26 – 2011-08-28 (×3): 40 mg via ORAL
  Filled 2011-08-26 (×3): qty 1

## 2011-08-26 MED ORDER — ASPIRIN EC 81 MG PO TBEC
81.0000 mg | DELAYED_RELEASE_TABLET | Freq: Every day | ORAL | Status: DC
  Administered 2011-08-26 – 2011-08-28 (×3): 81 mg via ORAL
  Filled 2011-08-26 (×4): qty 1

## 2011-08-26 MED ORDER — IOHEXOL 300 MG/ML  SOLN
100.0000 mL | Freq: Once | INTRAMUSCULAR | Status: AC | PRN
  Administered 2011-08-26: 100 mL via INTRAVENOUS

## 2011-08-26 MED ORDER — RISAQUAD PO CAPS
1.0000 | ORAL_CAPSULE | Freq: Three times a day (TID) | ORAL | Status: DC
  Administered 2011-08-26 – 2011-09-02 (×19): 1 via ORAL
  Filled 2011-08-26 (×25): qty 1

## 2011-08-26 MED ORDER — ALBUTEROL SULFATE (5 MG/ML) 0.5% IN NEBU: 2.5000 mg | INHALATION_SOLUTION | RESPIRATORY_TRACT | Status: DC | PRN

## 2011-08-26 MED ORDER — METOPROLOL SUCCINATE ER 25 MG PO TB24
25.0000 mg | ORAL_TABLET | Freq: Every day | ORAL | Status: DC
  Administered 2011-08-26 – 2011-09-02 (×6): 25 mg via ORAL
  Filled 2011-08-26 (×8): qty 1

## 2011-08-26 MED ORDER — LEVOTHYROXINE SODIUM 25 MCG PO TABS
25.0000 ug | ORAL_TABLET | Freq: Every day | ORAL | Status: DC
  Administered 2011-08-26 – 2011-09-02 (×7): 25 ug via ORAL
  Filled 2011-08-26 (×9): qty 1

## 2011-08-26 MED ORDER — FERROUS SULFATE 325 (65 FE) MG PO TABS
325.0000 mg | ORAL_TABLET | Freq: Two times a day (BID) | ORAL | Status: DC
  Administered 2011-08-26 – 2011-09-02 (×14): 325 mg via ORAL
  Filled 2011-08-26 (×16): qty 1

## 2011-08-26 MED ORDER — ATORVASTATIN CALCIUM 20 MG PO TABS
20.0000 mg | ORAL_TABLET | Freq: Every day | ORAL | Status: DC
  Administered 2011-08-26 – 2011-09-02 (×7): 20 mg via ORAL
  Filled 2011-08-26 (×8): qty 1

## 2011-08-26 MED ORDER — POTASSIUM CHLORIDE CRYS ER 20 MEQ PO TBCR
20.0000 meq | EXTENDED_RELEASE_TABLET | Freq: Every day | ORAL | Status: DC
  Administered 2011-08-26 – 2011-09-02 (×7): 20 meq via ORAL
  Filled 2011-08-26 (×8): qty 1

## 2011-08-26 MED ORDER — CITALOPRAM HYDROBROMIDE 10 MG PO TABS
10.0000 mg | ORAL_TABLET | Freq: Every day | ORAL | Status: DC
  Administered 2011-08-26 – 2011-09-02 (×7): 10 mg via ORAL
  Filled 2011-08-26 (×8): qty 1

## 2011-08-26 MED ORDER — GUAIFENESIN-DM 100-10 MG/5ML PO SYRP: 5.0000 mL | ORAL_SOLUTION | ORAL | Status: DC | PRN

## 2011-08-26 MED ORDER — ACETAMINOPHEN 650 MG RE SUPP: 650.0000 mg | Freq: Four times a day (QID) | RECTAL | Status: DC | PRN

## 2011-08-26 MED ORDER — VITAMIN B-1 100 MG PO TABS
100.0000 mg | ORAL_TABLET | Freq: Every day | ORAL | Status: DC
  Administered 2011-08-26 – 2011-09-02 (×7): 100 mg via ORAL
  Filled 2011-08-26 (×8): qty 1

## 2011-08-26 MED ORDER — ONDANSETRON HCL 4 MG/2ML IJ SOLN
4.0000 mg | Freq: Four times a day (QID) | INTRAMUSCULAR | Status: DC | PRN
  Administered 2011-08-26 – 2011-09-02 (×2): 4 mg via INTRAVENOUS
  Filled 2011-08-26 (×2): qty 2

## 2011-08-26 MED ORDER — DEXTROSE 5 % IV SOLN
1.0000 g | Freq: Once | INTRAVENOUS | Status: AC
  Administered 2011-08-26: 1 g via INTRAVENOUS
  Filled 2011-08-26: qty 10

## 2011-08-26 MED ORDER — METRONIDAZOLE 500 MG PO TABS
500.0000 mg | ORAL_TABLET | Freq: Three times a day (TID) | ORAL | Status: DC
  Administered 2011-08-26: 500 mg via ORAL
  Filled 2011-08-26 (×3): qty 1

## 2011-08-26 MED ORDER — VANCOMYCIN 50 MG/ML ORAL SOLUTION
125.0000 mg | Freq: Four times a day (QID) | ORAL | Status: DC
  Filled 2011-08-26 (×3): qty 2.5

## 2011-08-26 MED ORDER — BOOST / RESOURCE BREEZE PO LIQD
1.0000 | Freq: Every day | ORAL | Status: DC | PRN
  Administered 2011-08-26: 1 via ORAL

## 2011-08-26 MED ORDER — HYDROCODONE-ACETAMINOPHEN 5-325 MG PO TABS: 1.0000 | ORAL_TABLET | ORAL | Status: DC | PRN

## 2011-08-26 MED ORDER — SODIUM CHLORIDE 0.9 % IJ SOLN
3.0000 mL | Freq: Two times a day (BID) | INTRAMUSCULAR | Status: DC
  Administered 2011-08-26 – 2011-08-30 (×6): 3 mL via INTRAVENOUS

## 2011-08-26 MED ORDER — VANCOMYCIN 50 MG/ML ORAL SOLUTION
125.0000 mg | Freq: Four times a day (QID) | ORAL | Status: DC
  Administered 2011-08-26 – 2011-08-29 (×12): 125 mg via ORAL
  Filled 2011-08-26 (×17): qty 2.5

## 2011-08-26 NOTE — Telephone Encounter (Signed)
Caller: Phillips Odor; PCP: Oliver Barre; CB#: [phone];  Call regarding Seen Iin Office On 08/24/11 and Referral Made for Gastrologist for 08/25/11.  Had To Cancel Appnt Yesterday D/T Increased Sickness/ Dehydration went to [facility] Last Night-08/25/11. He Has Had A Catheter for Past Month and issues with CDiff. She Is Calling To Update Dr. [name] and to ask if Allayne Butcher will see him in hospital. Reassurance given that hospital MD will adress his needs and Specialist will see him there that are part of Hospital Staff. Advised to call for f/u appnt upon discharge. PCP Calls- no triage.

## 2011-08-26 NOTE — ED Notes (Addendum)
Attempted to obtain urine from pt however, no port on cath as well as no urine in tubing.  Notified Carollee Herter, RN and Misty Stanley, charge of situation and Misty Stanley recommended changing the pts cath (which is due to be changed) thus providing a port accessible cath as well as fresh urine.

## 2011-08-26 NOTE — Progress Notes (Signed)
Patient was seen and examined earlier this morning by my associate agree with her exam and findings.  Please refer to her HPI for further details.  Did have c diff positive PCR in context of increase bouts of diarrhea.  Changed flagyl to oral vancomycin given severity of his symptoms and elevated WBC count. Will consider GI consult if patient does not improve despite our recommendations.  Will avoid placing on antibiotics for UTI. Patients current symptoms likely due to diarrhea and C difficil infection.  [name], [employer]

## 2011-08-26 NOTE — H&P (Signed)
PCP:   Oliver Barre, MD  Cardiology: [facility]  Chief Complaint:   diarhea  HPI: [name] is a 78 y.o. male   has a past medical history of ADENOCARCINOMA, PROSTATE; ALLERGIC RHINITIS; B12 DEFICIENCY; BENIGN PROSTATIC HYPERTROPHY; CAROTID ARTERY DISEASE; CEREBROVASCULAR ACCIDENT, HX OF; CORONARY ARTERY DISEASE; DIVERTICULOSIS, COLON; GERD; HEMORRHOIDS; HIATAL HERNIA; HYPERLIPIDEMIA; HYPERTENSION; OSTEOPENIA; PEPTIC ULCER DISEASE; PERIPHERAL VASCULAR DISEASE; Personal history of alcoholism; PROSTATE CANCER, HX OF; PUD, HX OF; TRANSIENT ISCHEMIC ATTACK; Shingles; Anemia; AS (aortic stenosis); S/P AVR (aortic valve replacement) (09/23/2010); Complication of anesthesia (09/23/2010); Transfusion reaction, chill fever type (04/28/2011); Hemorrhagic cystitis (04/28/2011); Blood transfusion; and Gross hematuria.   Presented with  He have had hemorrhagic cystitis due to radiation therapy for prostate cancer and has had indwelling catheter. He have hd diarrhea for prolonged period of time and have been treated for c.Diff in the past. He was just started on an other course of flagyl due to recurrent diarrhea lately. No fever but occasional chills. He has history of diverticulitis and intraabdominal abscess that hd to be drained in June. He have had frequent falls. Have decreased PO intake and worsening generalized weakness. He gets up about every hour to have a BM. He have had hyperbaric chamber treatments but lately has not been able to finish them have had complications of elevated blood pressure. He has been skipping the treatments and may need to restart.   Review of Systems:    Pertinent positives include: chills, diarrhea, weight loss 12 lb /month  Constitutional:  No weight loss, night sweats, Fevers, fatigue,  HEENT:  No headaches, Difficulty swallowing,Tooth/dental problems,Sore throat,  No sneezing, itching, ear ache, nasal congestion, post nasal drip,  Cardio-vascular:    No chest pain, Orthopnea, PND, anasarca, dizziness, palpitations.no Bilateral lower extremity swelling  GI:  No heartburn, indigestion, abdominal pain, nausea, vomiting,  change in bowel habits, loss of appetite, melena, blood in stool, hematemesis Resp:  no shortness of breath at rest. No dyspnea on exertion, No excess mucus, no productive cough, No non-productive cough, No coughing up of blood.No change in color of mucus.No wheezing. Skin:  no rash or lesions. No jaundice GU:  no dysuria, change in color of urine, no urgency or frequency. No straining to urinate.  No flank pain.  Musculoskeletal:  No joint pain or no joint swelling. No decreased range of motion. No back pain.  Psych:  No change in mood or affect. No depression or anxiety. No memory loss.  Neuro: no localizing neurological complaints, no tingling, no weakness, no double vision, no gait abnormality, no slurred speech, no confusion  Otherwise ROS are negative except for above, 10 systems were reviewed  Past Medical History: Past Medical History  Diagnosis Date  . ADENOCARCINOMA, PROSTATE   . ALLERGIC RHINITIS   . B12 DEFICIENCY   . BENIGN PROSTATIC HYPERTROPHY   . CAROTID ARTERY DISEASE   . CEREBROVASCULAR ACCIDENT, HX OF   . CORONARY ARTERY DISEASE   . DIVERTICULOSIS, COLON   . GERD   . HEMORRHOIDS   . HIATAL HERNIA   . HYPERLIPIDEMIA   . HYPERTENSION   . OSTEOPENIA   . PEPTIC ULCER DISEASE   . PERIPHERAL VASCULAR DISEASE   . Personal history of alcoholism   . PROSTATE CANCER, HX OF   . PUD, HX OF   . TRANSIENT ISCHEMIC ATTACK   . Shingles   . Anemia   . AS (aortic stenosis)   . S/P AVR (aortic valve replacement) 09/23/2010  .  Complication of anesthesia 09/23/2010    very confused after waking up. Stopped drinking 40 days before this surgery.  . Transfusion reaction, chill fever type 04/28/2011    Fever, rigors, tachycardia,tachypnea but no evidence of hemolysis  . Hemorrhagic cystitis 04/28/2011    Due  to previous radiation for PSA recurrence of prostate cancer  . Blood transfusion   . Gross hematuria    Past Surgical History  Procedure Date  . Popliteal synovial cyst excision   . Right carotid endarterectomy 1999    Left carotid total chronic occlusion  . Prostate surgery   . Cardic stent 2004  . Aortic valve replacement 09/23/2010    #38mm [facility] Ease pericardial tissue valve  . Coronary artery bypass graft 09/23/2010    CABG x1 using LIMA to LAD  . Cystoscopy with biopsy 02/15/2011    Procedure: CYSTOSCOPY WITH BIOPSY;  Surgeon: Anner Crete, MD;  Location: [facility];  Service: Urology;  Laterality: N/A;  Cystoscopy, biopsy and fulguration of the bladder     Medications: Prior to Admission medications   Medication Sig Start Date End Date Taking? Authorizing Provider  amiodarone (PACERONE) 200 MG tablet Take 200 mg by mouth 2 (two) times daily. 05/28/11 05/27/12 Yes Rollene Rotunda, MD  aspirin 81 MG tablet Take 81 mg by mouth daily.   Yes Historical Provider, MD  atorvastatin (LIPITOR) 20 MG tablet Take 1 tablet (20 mg total) by mouth daily. 02/18/11  Yes Corwin Levins, MD  citalopram (CELEXA) 10 MG tablet Take 1 tablet (10 mg total) by mouth daily. 07/21/11 07/20/12 Yes Corwin Levins, MD  feeding supplement (ENSURE COMPLETE) LIQD Take 237 mLs by mouth 3 (three) times daily between meals. 06/11/11  Yes Osvaldo Shipper, MD  ferrous sulfate (FERROUSUL) 325 (65 FE) MG tablet Take 1 tablet (325 mg total) by mouth 2 (two) times daily. 05/11/11 05/10/12 Yes Alma Concepcion Elk, MD  Lactobacillus (ACIDOPHILUS PROBIOTIC) 100 MG CAPS Take 1 capsule (100 mg total) by mouth 3 (three) times daily with meals. 07/24/11  Yes Clanford Cyndie Mull, MD  levothyroxine (SYNTHROID, LEVOTHROID) 25 MCG tablet Take 1 tablet (25 mcg total) by mouth daily before breakfast. 08/24/11 08/23/12 Yes Corwin Levins, MD  metoprolol succinate (TOPROL-XL) 25 MG 24 hr tablet Take 1 tablet (25 mg total) by mouth daily. 07/21/11  Yes Corwin Levins, MD  Multiple Vitamin (MULITIVITAMIN WITH MINERALS) TABS Take 1 tablet by mouth daily.   Yes Historical Provider, MD  omeprazole (PRILOSEC) 40 MG capsule Take 40 mg by mouth daily.     Yes Historical Provider, MD  potassium chloride SA (K-DUR,KLOR-CON) 20 MEQ tablet Take 1 tablet (20 mEq total) by mouth daily. 06/18/11 06/17/12 Yes Rollene Rotunda, MD  thiamine 100 MG tablet Take 1 tablet (100 mg total) by mouth daily. 07/21/11 07/20/12 Yes Corwin Levins, MD  Vitamins A & D (VITAMIN A & D) cream Apply 1 application topically daily.   Yes Historical Provider, MD  metroNIDAZOLE (FLAGYL) 500 MG tablet Take 1 tablet (500 mg total) by mouth 3 (three) times daily. 08/24/11 09/03/11  Corwin Levins, MD    Allergies:   Allergies  Allergen Reactions  . Tolterodine Tartrate     Reaction=unknown    Social History:  Ambulatory independently Lives at  Home with wife   reports that he quit smoking about 53 years ago. He has never used smokeless tobacco. He reports that he does not drink alcohol or use illicit drugs.  Family History: family history includes Colon cancer (age of onset:62) in his mother; Coronary artery disease in his sister; Diabetes in his sister; Hypertension in his sister; Multiple sclerosis in his sister; and Stroke in his brother.    Physical Exam: Patient Vitals for the past 24 hrs:  BP Temp Temp src Pulse Resp SpO2  08/26/11 0220 135/79 mmHg 97.8 F (36.6 C) Oral 90  19  100 %  08/26/11 0145 137/81 mmHg 97.4 F (36.3 C) Oral 91  15  99 %  08/25/11 2126 128/89 mmHg 97.5 F (36.4 C) Oral 108  16  100 %  08/25/11 2122 - - - - - 99 %    1. General:  in No Acute distress 2. Psychological: Alert and Oriented 3. Head/ENT:    Dry Mucous Membranes                          Head Non traumatic, neck supple                          Normal  Dentition 4. SKIN: decreased Skin turgor,  Skin clean Dry and intact no rash 5. Heart: Regular rate and rhythm no Murmur, Rub or gallop 6.  Lungs: Clear to auscultation bilaterally, no wheezes or crackles   7. Abdomen: Soft, non-tender, Non distended 8. Lower extremities: no clubbing, cyanosis, or edema 9. Neurologically Grossly intact, moving all 4 extremities equally 10. MSK: Normal range of motion  body mass index is unknown because there is no height or weight on file.   Labs on Admission:   [facility] 08/25/11 2210 08/24/11 1121  NA 128* 131*  K 4.5 4.8  CL 93* 97  CO2 19 25  GLUCOSE 131* 104*  BUN 36* 22  CREATININE 1.11 0.9  CALCIUM 8.8 8.5  MG -- --  PHOS -- --    Basename 08/25/11 2210 08/24/11 1121  AST 60* 48*  ALT 35 28  ALKPHOS 129* 91  BILITOT 0.5 0.8  PROT 6.1 6.0  ALBUMIN 2.3* 2.7*    Basename 08/25/11 2210 08/24/11 1121  LIPASE 9* 20.0  AMYLASE -- --    Basename 08/25/11 2210 08/24/11 1121  WBC 34.9* 24.5*  NEUTROABS 32.2* 22.9*  HGB 12.0* 10.4*  HCT 35.6* 32.3*  MCV 87.5 91.8  PLT 401* 300.0   No results found for this basename: CKTOTAL:3,CKMB:3,CKMBINDEX:3,TROPONINI:3 in the last 72 hours No results found for this basename: TSH,T4TOTAL,FREET3,T3FREE,THYROIDAB in the last 72 hours No results found for this basename: VITAMINB12:2,FOLATE:2,FERRITIN:2,TIBC:2,IRON:2,RETICCTPCT:2 in the last 72 hours Lab Results  Component Value Date   HGBA1C 5.6 09/18/2010    The CrCl is unknown because both a height and weight (above a minimum accepted value) are required for this calculation. ABG    Component Value Date/Time   PHART 7.496* 05/06/2011 0355   HCO3 25.8* 05/06/2011 0355   TCO2 24.0 05/06/2011 0355   ACIDBASEDEF 2.7* 04/29/2011 1316   O2SAT 98.9 05/06/2011 0355     Lab Results  Component Value Date   DDIMER 1.94* 04/25/2011     Other results:  I have pearsonaly reviewed this: ECG REPORT  Rate: 108  Rhythm:  sinus tachycardia first degree AV block ST&T Change: No ischemic changes UA too numerous too count  Cultures:    Component Value Date/Time   SDES URINE, CLEAN CATCH  07/22/2011 2037   SPECREQUEST NONE 07/22/2011 2037   CULT NO GROWTH 07/22/2011 2037  REPTSTATUS 07/23/2011 FINAL 07/22/2011 2037       Radiological Exams on Admission: Dg Chest Port 1 View  08/26/2011  *RADIOLOGY REPORT*  Clinical Data: Weakness and elevated white cell count.  PORTABLE CHEST - 1 VIEW  Comparison: 07/21/2011  Findings: Stable appearance of postoperative changes in the mediastinum.  Normal heart size and pulmonary vascularity. Emphysematous changes and scattered fibrosis in the lungs.  No focal airspace consolidation.  No blunting of costophrenic angles. No pneumothorax.  Calcified and tortuous aorta.  Mediastinal contours appear intact.  Old right rib fractures.  No significant change since previous study.  IMPRESSION: Emphysema and fibrosis in the lungs.  No evidence of active pulmonary disease.  Original Report Authenticated By: Marlon Pel, M.D.    Chart has been reviewed  Assessment/Plan  78 year old gentleman with history of prostatic cancer and hemorrhagic cystitis as well as history of possible C. difficile colitis in the past and intra-abdominal abcsesses presents with diarrhea and dehydration with elevated white blood cell count  Present on Admission:  .Acute diarrhea - patient had had recurrent diarrhea but his current episodes get worse he had been in the past treated for C. difficile and recently was restarted by his primary care provider on Flagyl but only took one day that. We'll continue Flagyl for now will obtain C. difficile PCR and stool cultures. Would recommend GI consult. Hold off on other antibiotics except for Flagyl until C. difficile could be ruled out patient is nontoxic-appearing at this point.  .Atrial fibrillation - continue amiodarone, currently at in sinus rhythm, watching telemetry  .E-coli UTI - due to chronic indwelling Foley he could be: I still hold off on antibiotics for now until have urine culture back to see what the sensitivities on Indocin  this incident of possible C. difficile elect to wait on antibiotics  .Hypertension - continue home medications  .Hyponatremia - this is in the setting of dehydration patient has chronic hyponatremia start to worsen his baseline we'll give gentle IV fluids  .Intra-abdominal abscess - will repeat CT imaging of the abdomen and pelvis given worsening of her cell count and generalized malaise   Prophylaxis: SCDs Protonix  CODE STATUS: FULL CODE  Other plan as per orders.  I have spent a total of 65 min on this admission, I'm taken to review detail from the chart  Ellen Mayol 08/26/2011, 3:56 AM

## 2011-08-26 NOTE — Progress Notes (Signed)
INITIAL ADULT NUTRITION ASSESSMENT Date: 08/26/2011   Time: 12:23 PM Reason for Assessment: Nutrition Risk unintentional weight loss > 10 lb over 1 month  ASSESSMENT: Male 77 y.o.  Dx: diarrhea  Hx:  Past Medical History  Diagnosis Date  . ADENOCARCINOMA, PROSTATE   . ALLERGIC RHINITIS   . B12 DEFICIENCY   . BENIGN PROSTATIC HYPERTROPHY   . CAROTID ARTERY DISEASE   . CEREBROVASCULAR ACCIDENT, HX OF   . CORONARY ARTERY DISEASE   . DIVERTICULOSIS, COLON   . GERD   . HEMORRHOIDS   . HIATAL HERNIA   . HYPERLIPIDEMIA   . HYPERTENSION   . OSTEOPENIA   . PEPTIC ULCER DISEASE   . PERIPHERAL VASCULAR DISEASE   . Personal history of alcoholism   . PROSTATE CANCER, HX OF   . PUD, HX OF   . TRANSIENT ISCHEMIC ATTACK   . Shingles   . Anemia   . AS (aortic stenosis)   . S/P AVR (aortic valve replacement) 09/23/2010  . Complication of anesthesia 09/23/2010    very confused after waking up. Stopped drinking 40 days before this surgery.  . Transfusion reaction, chill fever type 04/28/2011    Fever, rigors, tachycardia,tachypnea but no evidence of hemolysis  . Hemorrhagic cystitis 04/28/2011    Due to previous radiation for PSA recurrence of prostate cancer  . Blood transfusion   . Gross hematuria     Related Meds:  Scheduled Meds:   . sodium chloride   Intravenous Once  . acidophilus  1 capsule Oral TID WC  . amiodarone  200 mg Oral BID  . aspirin EC  81 mg Oral Daily  . atorvastatin  20 mg Oral Daily  . cefTRIAXone (ROCEPHIN)  IV  1 g Intravenous Once  . citalopram  10 mg Oral Daily  . feeding supplement  237 mL Oral TID BM  . ferrous sulfate  325 mg Oral BID  . levothyroxine  25 mcg Oral QAC breakfast  . metoprolol succinate  25 mg Oral Daily  . metroNIDAZOLE  500 mg Oral TID  . pantoprazole  40 mg Oral Q1200  . potassium chloride SA  20 mEq Oral Daily  . sodium chloride  3 mL Intravenous Q12H  . thiamine  100 mg Oral Daily   Continuous Infusions:   . sodium chloride 75  mL/hr at 08/26/11 0706   PRN Meds:.acetaminophen, acetaminophen, albuterol, guaiFENesin-dextromethorphan, HYDROcodone-acetaminophen, ondansetron (ZOFRAN) IV, ondansetron   Ht: 5\' 11"  (180.3 cm)  Wt: 144 lb 11.2 oz (65.635 kg)  Ideal Wt: 78 kg % Ideal Wt: 83.7% Wt Readings from Last 10 Encounters:  08/26/11 144 lb 11.2 oz (65.635 kg)  08/24/11 146 lb (66.225 kg)  07/28/11 158 lb 8 oz (71.895 kg)  07/21/11 161 lb 14.4 oz (73.437 kg)  06/23/11 162 lb (73.483 kg)  06/18/11 162 lb 12.8 oz (73.846 kg)  06/07/11 157 lb 3 oz (71.3 kg)  05/11/11 157 lb 6.5 oz (71.4 kg)  02/15/11 173 lb (78.472 kg)  02/15/11 173 lb (78.472 kg)  *Weight down 29 lb over 6 months, 16.7% from baseline.   Body mass index is 20.18 kg/(m^2). (WNL)  Food/Nutrition Related Hx: Patient reported he has no appetite. Pt reported he did not eat anything for the past 2 days. Per RN pt will not eat. Pt appears thin. Patient stated he does not want anything to eat or drink.  Labs:  CMP     Component Value Date/Time   NA 129* 08/26/2011 0801  K 4.1 08/26/2011 0801   CL 97 08/26/2011 0801   CO2 20 08/26/2011 0801   GLUCOSE 116* 08/26/2011 0801   BUN 41* 08/26/2011 0801   CREATININE 1.22 08/26/2011 0801   CREATININE 1.05 01/29/2011 1708   CALCIUM 7.9* 08/26/2011 0801   PROT 5.3* 08/26/2011 0801   ALBUMIN 2.0* 08/26/2011 0801   AST 53* 08/26/2011 0801   ALT 31 08/26/2011 0801   ALKPHOS 100 08/26/2011 0801   BILITOT 0.3 08/26/2011 0801   GFRNONAA 53* 08/26/2011 0801   GFRAA 61* 08/26/2011 0801    Intake/Output Summary (Last 24 hours) at 08/26/11 1225 Last data filed at 08/26/11 1610  Gross per 24 hour  Intake      0 ml  Output      1 ml  Net     -1 ml     Diet Order: Clear Liquid  Supplements/Tube Feeding: none at this time  IVF:    sodium chloride Last Rate: 75 mL/hr at 08/26/11 0706    Estimated Nutritional Needs:   Kcal: 1900-2100 Protein: 95-110 grams  Fluid: 1 ml per kcal intake  NUTRITION DIAGNOSIS: -Inadequate oral  intake (NI-2.1).  Status: Ongoing  RELATED TO: no appetite  AS EVIDENCE BY: pt refusing PO  MONITORING/EVALUATION(Goals): Diet advancements/ tolerance, PO intake, weights, labs 1. Diet advancement as medically able. 2. PO intake > 75% at meals.   EDUCATION NEEDS: -No education needs identified at this time  INTERVENTION: 1. Pt refusing any PO. Will order Resource Breeze PRN to offer to patient.  2. RD to follow for nutrition plan of care.   Dietitian [phone]  DOCUMENTATION CODES Per approved criteria  -Severe malnutrition in the context of chronic illness  Pt meets malnutrition criteria due to 16.7% weight loss from baseline over 6 months and PO intake likely meeting < 75% of estimated energy needs for > 1 month.   Iven Finn [facility] 08/26/2011, 12:23 PM

## 2011-08-26 NOTE — ED Notes (Signed)
During cath change, pt is trying to prevent leakage around the tip of the penis by wrapping toilet paper around the tip and holding it in place with a rubber band.  Let Carollee Herter, RN know about findings and discussed the break down of the tissue around the cath.

## 2011-08-26 NOTE — Telephone Encounter (Signed)
noted 

## 2011-08-26 NOTE — ED Notes (Signed)
Upon insertion of new cath, informed pt that I would return to obtain sample in a few minutes

## 2011-08-26 NOTE — ED Notes (Signed)
Explained to pt. That we need urine sample.  Clamped cath

## 2011-08-26 NOTE — Progress Notes (Signed)
   CARE MANAGEMENT NOTE 08/26/2011  Patient:  [name], [name]   Account Number:  [id number]  Date Initiated:  08/26/2011  Documentation initiated by:  Jiles Crocker  Subjective/Objective Assessment:   ADMITTED WITH NAUSEA/ VOMITING/ DIARRHEA; HX OF PROSTATE CANCER     Action/Plan:   PCP: Oliver Barre, MD  Cardiology: [facility]astroenter[facility];  LIVES AT HOME WITH SPOUSE; ACTIVE WITH [facility] FOR NURSING AND HHPT   Anticipated DC Date:  09/02/2011   Anticipated DC Plan:  HOME W HOME HEALTH SERVICES      DC Planning Services  CM consult               Status of service:  In process, will continue to follow Medicare Important Message given?  NA - LOS <3 / Initial given by admissions (If response is "NO", the following Medicare IM given date fields will be blank)  Per UR Regulation:  Reviewed for med. necessity/level of care/duration of stay Comments:  08/26/2011- B Juniper Cobey RN, BSN, MHA

## 2011-08-27 DIAGNOSIS — A0472 Enterocolitis due to Clostridium difficile, not specified as recurrent: Principal | ICD-10-CM

## 2011-08-27 DIAGNOSIS — E41 Nutritional marasmus: Secondary | ICD-10-CM

## 2011-08-27 DIAGNOSIS — E43 Unspecified severe protein-calorie malnutrition: Secondary | ICD-10-CM | POA: Diagnosis present

## 2011-08-27 DIAGNOSIS — E039 Hypothyroidism, unspecified: Secondary | ICD-10-CM

## 2011-08-27 LAB — PHOSPHORUS: Phosphorus: 3.6 mg/dL (ref 2.3–4.6)

## 2011-08-27 LAB — FECAL LACTOFERRIN, QUANT

## 2011-08-27 LAB — MAGNESIUM: Magnesium: 2.2 mg/dL (ref 1.5–2.5)

## 2011-08-27 MED ORDER — DEXTROSE-NACL 5-0.9 % IV SOLN
INTRAVENOUS | Status: DC
  Administered 2011-08-27 – 2011-09-02 (×6): via INTRAVENOUS

## 2011-08-27 MED ORDER — KCL IN DEXTROSE-NACL 20-5-0.45 MEQ/L-%-% IV SOLN: INTRAVENOUS | Status: DC

## 2011-08-27 NOTE — Clinical Documentation Improvement (Signed)
MALNUTRITION DOCUMENTATION CLARIFICATION  THIS DOCUMENT IS NOT A PERMANENT PART OF THE MEDICAL RECORD  TO RESPOND TO THE THIS QUERY, FOLLOW THE INSTRUCTIONS BELOW:  1. If needed, update documentation for the patient's encounter via the notes activity.  2. Access this query again and click edit on the In [employer].  3. After updating, or not, click F2 to complete all highlighted (required) fields concerning your review. Select "additional documentation in the medical record" OR "no additional documentation provided".  4. Click Sign note button.  5. The deficiency will fall out of your In Basket *Please let us know if you are not able to complete this workflow by phone or e-mail (listed below).  Please update your documentation within the medical record to reflect your response to this query.                                                                                        08/27/11   Dear [facility],  In a better effort to capture your patient's severity of illness, reflect appropriate length of stay and utilization of resources, a review of the patient medical record has revealed the following indicators.    Based on your clinical judgment, please clarify and document in a progress note and/or discharge summary the clinical condition associated with the following supporting information:  In responding to this query please exercise your independent judgment.  The fact that a query is asked, does not imply that any particular answer is desired or expected. 08/26/11 Nutrition assessment with Nutrition Dx for "Severe malnutrition in the context of chronic illness...". For accurate Dx specificity & severity please help validate nutrition documentation for clinical cond being eval'd, mon'd & tx'd. Thank you   Possible Clinical Conditions? . Mild Malnutrition  . Moderate Malnutrition  . Severe Malnutrition  . Protein Calorie Malnutrition . Severe Protein Calorie  Malnutrition  . Emaciation  . Cachexia   Other Condition (please specify) Cannot Clinically Determine   Supporting Information: -Risk Factors: see Nutrition Assessment 08/26/11  -Signs & Symptoms: see Nutrition Assessment 08/26/11  -Diagnostics: see Nutrition Assessment 08/26/11  -Treatments: see Nutrition Assessment 08/26/11  -Nutrition Consult: see Nutrition Assessment 08/26/11   You may use possible, probable, or suspect with inpatient documentation. possible, probable, suspected diagnoses MUST be documented at the time of discharge  Reviewed: additional documentation in the medical record  Thank You,  Toribio Harbour, RN, BSN, CCDS Certified Clinical Documentation Specialist Pager: [phone]  Health Information Management [place]  Please refrain from sending me CDI inquiries if full note has not been placed.

## 2011-08-27 NOTE — Progress Notes (Signed)
[facility] PROGRESS NOTE  [name] WUJ:[id number] DOB: 1926/08/01 DOA: 08/25/2011 PCP: Oliver Barre, MD  Assessment/Plan: Active Problems:  Atrial fibrillation  Intra-abdominal abscess  Hypertension  E-coli UTI  Hyponatremia  Acute diarrhea  Leukocytosis Severe malnutrition  1. C difficil diarrhea - At this point patient is on oral vancomycin.  His diarrhea seems to be subsiding - WBC trending down - Pt is afebrile - Will continue current regimen and should condition not improve will consider GI consult. - Continue florastor  2. H/o intraabdominal abscess - CT of abdomen performed: No drainable fluid collection or abscess.   3. Leukocytosis - Related to # 1 and improving on oral vancomycin  4. Hyponatremia - Likely related to poor oral solute intake.  Place on normal saline today - Check level tomorrow.  5. Severe Malnutrition - At this point likely related to chronic illness.  Should improve with resolution of active c difficil infection.   - Nutritional supplements on board.  6. Atrial fibrillation - Stable continue current regimen  7. Hypothyroidism - Stable continue current home regimen.   Code Status: Full Family Communication: Spoke with patient today Disposition Plan: Pending resolution of diarrhea and improvement in WBC count and po intake   Brief narrative: Pt is an 78 y/o with h/o hemorrhagic cystitis due to radiation therapy for prostate cancer and chronic indwelling catheter.  Presented to the hospital secondary to weakness, decrease oral intake, and diarrhea.  Has had recent history of C difficil infection and despite treatment not too long ago presents with another bout of c difficil associated diarrhea.  Switched from flagyl to oral vancomycin and currently improving.  Consultants:  none  Procedures:  none  Antibiotics:  Oral vancomycin day # 2  HPI/Subjective: Patient reports that diarrhea is improved.  Reports that his appetite  is poor. Otherwise denies any BRBPR, nausea, fever, or chills.  Objective: Filed Vitals:   08/26/11 2024 08/27/11 0202 08/27/11 0500 08/27/11 1520  BP: 129/75 139/84 152/94 133/77  Pulse: 77 73 76 63  Temp: 97.4 F (36.3 C) 97.7 F (36.5 C) 97 F (36.1 C) 97.4 F (36.3 C)  TempSrc: Axillary Oral Oral Oral  Resp: 19 19 20 18   Height:      Weight:      SpO2: 98% 99% 99% 100%    Intake/Output Summary (Last 24 hours) at 08/27/11 1817 Last data filed at 08/27/11 1500  Gross per 24 hour  Intake      0 ml  Output   1100 ml  Net  -1100 ml    Exam:   General:  Pt in NAD, A and O x 3  Cardiovascular: RRR, No MRG  Respiratory: CTA BL, no wheezes  Abdomen: Soft, ND   Data Reviewed: Basic Metabolic Panel:  Lab 08/27/11 9562 08/26/11 0801 08/25/11 2210 08/24/11 1121  NA -- 129* 128* 131*  K -- 4.1 4.5 4.8  CL -- 97 93* 97  CO2 -- 20 19 25   GLUCOSE -- 116* 131* 104*  BUN -- 41* 36* 22  CREATININE -- 1.22 1.11 0.9  CALCIUM -- 7.9* 8.8 8.5  MG 2.2 -- -- --  PHOS 3.6 -- -- --   Liver Function Tests:  Lab 08/26/11 0801 08/25/11 2210 08/24/11 1121  AST 53* 60* 48*  ALT 31 35 28  ALKPHOS 100 129* 91  BILITOT 0.3 0.5 0.8  PROT 5.3* 6.1 6.0  ALBUMIN 2.0* 2.3* 2.7*    Lab 08/25/11 2210 08/24/11 1121  LIPASE  9* 20.0  AMYLASE -- --   No results found for this basename: AMMONIA:5 in the last 168 hours CBC:  Lab 08/26/11 0801 08/25/11 2210 08/24/11 1121  WBC 28.3* 34.9* 24.5*  NEUTROABS -- 32.2* 22.9*  HGB 10.8* 12.0* 10.4*  HCT 32.0* 35.6* 32.3*  MCV 86.7 87.5 91.8  PLT 362 401* 300.0   Cardiac Enzymes: No results found for this basename: CKTOTAL:5,CKMB:5,CKMBINDEX:5,TROPONINI:5 in the last 168 hours BNP (last 3 results)  Basename 07/21/11 2335 04/29/11 0130 04/28/11 1002  PROBNP 1170.0* 11811.0* 10058.0*   CBG: No results found for this basename: GLUCAP:5 in the last 168 hours  Recent Results (from the past 240 hour(s))  URINE CULTURE     Status: Normal  (Preliminary result)   Collection Time   08/26/11  1:46 AM      Component Value Range Status Comment   Specimen Description URINE, CATHETERIZED   Final    Special Requests NONE   Final    Culture  Setup Time 08/26/2011 09:52   Final    Colony Count PENDING   Incomplete    Culture Culture reincubated for better growth   Final    Report Status PENDING   Incomplete   CULTURE, BLOOD (ROUTINE X 2)     Status: Normal (Preliminary result)   Collection Time   08/26/11  3:10 AM      Component Value Range Status Comment   Specimen Description BLOOD LEFT HAND   Final    Special Requests BOTTLES DRAWN AEROBIC AND ANAEROBIC 3CC   Final    Culture  Setup Time 08/26/2011 08:56   Final    Culture     Final    Value:        BLOOD CULTURE RECEIVED NO GROWTH TO DATE CULTURE WILL BE HELD FOR 5 DAYS BEFORE ISSUING A FINAL NEGATIVE REPORT   Report Status PENDING   Incomplete   CULTURE, BLOOD (ROUTINE X 2)     Status: Normal (Preliminary result)   Collection Time   08/26/11  3:20 AM      Component Value Range Status Comment   Specimen Description BLOOD RIGHT ANTECUBITAL   Final    Special Requests BOTTLES DRAWN AEROBIC AND ANAEROBIC [facility]   Final    Culture  Setup Time 08/26/2011 08:56   Final    Culture     Final    Value:        BLOOD CULTURE RECEIVED NO GROWTH TO DATE CULTURE WILL BE HELD FOR 5 DAYS BEFORE ISSUING A FINAL NEGATIVE REPORT   Report Status PENDING   Incomplete   CLOSTRIDIUM DIFFICILE BY PCR     Status: Abnormal   Collection Time   08/26/11  7:30 AM      Component Value Range Status Comment   C difficile by pcr POSITIVE (*) NEGATIVE Final      Studies: Ct Abdomen Pelvis W Contrast  08/26/2011  *RADIOLOGY REPORT*  Clinical Data: C diff colitis, on flagyl, evaluate for recurrent abscess.  Weight loss, history of diverticulitis, prostate cancer status post XRT.  CT ABDOMEN AND PELVIS WITH CONTRAST  Technique:  Multidetector CT imaging of the abdomen and pelvis was performed following the standard  protocol during bolus administration of intravenous contrast.  Contrast: OMNIPAQUE IOHEXOL 300 MG/ML  SOLN  Comparison: 06/22/2011  Findings: Stable partially calcified nodule at the right lung base (series 6/image 14).  Small hiatal hernia.  Liver, spleen, pancreas, and adrenal glands are within normal limits.  Gallbladder  is notable for layering sludge and a small gallstone in the gallbladder fundus (series 2/image 28).  No associated inflammatory changes.  No intrahepatic or extrahepatic ductal dilatation.  Kidneys are within normal limits.  No hydronephrosis.  No evidence of bowel obstruction. Large duodenal diverticulum (series 2/image 36).  Diffuse colonic wall thickening with associated inflammatory changes, compatible with reported history of pseudomembranous colitis.  No drainable fluid collection or abscess.  No free air clear.  Small volume abdominopelvic ascites.  Atherosclerotic calcifications of the abdominal aorta and branch vessels.  No suspicious abdominopelvic lymphadenopathy.  Concentric bladder wall thickening, correlate for cystitis (series 2/image 80). High density fluid within the bladder may reflect hemorrhagic (series 2/image 81).  Indwelling Foley catheter with associated tiny foci of nondependent gas.  Status post prostatectomy.  Mild degenerative changes of the visualized thoracolumbar spine, most prominent at L5 S1.  IMPRESSION:  Diffuse colonic wall thickening with associated inflammatory changes, compatible with reported history of pseudomembranous colitis.  No drainable fluid collection or abscess.  No free air clear.  Thick-walled bladder, correlate for cystitis.  Suspected bladder hemorrhage.  Indwelling Foley catheter.  Additional ancillary findings as above.  Original Report Authenticated By: Charline Bills, M.D.   Dg Chest Port 1 View  08/26/2011  *RADIOLOGY REPORT*  Clinical Data: Weakness and elevated white cell count.  PORTABLE CHEST - 1 VIEW  Comparison: 07/21/2011   Findings: Stable appearance of postoperative changes in the mediastinum.  Normal heart size and pulmonary vascularity. Emphysematous changes and scattered fibrosis in the lungs.  No focal airspace consolidation.  No blunting of costophrenic angles. No pneumothorax.  Calcified and tortuous aorta.  Mediastinal contours appear intact.  Old right rib fractures.  No significant change since previous study.  IMPRESSION: Emphysema and fibrosis in the lungs.  No evidence of active pulmonary disease.  Original Report Authenticated By: Marlon Pel, M.D.    Scheduled Meds:   . acidophilus  1 capsule Oral TID WC  . amiodarone  200 mg Oral BID  . aspirin EC  81 mg Oral Daily  . atorvastatin  20 mg Oral Daily  . citalopram  10 mg Oral Daily  . feeding supplement  237 mL Oral TID BM  . ferrous sulfate  325 mg Oral BID  . levothyroxine  25 mcg Oral QAC breakfast  . metoprolol succinate  25 mg Oral Daily  . pantoprazole  40 mg Oral Q1200  . potassium chloride SA  20 mEq Oral Daily  . sodium chloride  3 mL Intravenous Q12H  . thiamine  100 mg Oral Daily  . vancomycin  125 mg Oral QID   Continuous Infusions:   Active Problems:  Atrial fibrillation  Intra-abdominal abscess  Hypertension  E-coli UTI  Hyponatremia  Acute diarrhea  Leukocytosis    Time spent: > 30 minutes    Penny Pia  [facility] Pager [phone]. If 8PM-8AM, please contact night-coverage at [email or web address], password [facility] 08/27/2011, 6:17 PM  LOS: 2 days

## 2011-08-28 ENCOUNTER — Encounter (HOSPITAL_COMMUNITY): Payer: Self-pay | Admitting: *Deleted

## 2011-08-28 ENCOUNTER — Inpatient Hospital Stay (HOSPITAL_COMMUNITY): Payer: Medicare Other

## 2011-08-28 DIAGNOSIS — A498 Other bacterial infections of unspecified site: Secondary | ICD-10-CM

## 2011-08-28 DIAGNOSIS — N390 Urinary tract infection, site not specified: Secondary | ICD-10-CM

## 2011-08-28 LAB — BASIC METABOLIC PANEL
BUN: 34 mg/dL — ABNORMAL HIGH (ref 6–23)
Calcium: 7.4 mg/dL — ABNORMAL LOW (ref 8.4–10.5)
GFR calc Af Amer: >90 mL/min (ref >90)
GFR calc non Af Amer: 80 mL/min — ABNORMAL LOW (ref >90)
Glucose, Bld: 115 mg/dL — ABNORMAL HIGH (ref 70–99)
Sodium: 130 mEq/L — ABNORMAL LOW (ref 135–145)

## 2011-08-28 LAB — CBC
Hemoglobin: 9 g/dL — ABNORMAL LOW (ref 13.0–17.0)
MCH: 28.9 pg (ref 26.0–34.0)
MCHC: 33.1 g/dL (ref 30.0–36.0)
RDW: 17.6 % — ABNORMAL HIGH (ref 11.5–15.5)

## 2011-08-28 MED ORDER — SODIUM CHLORIDE 0.9 % IV BOLUS (SEPSIS)
1000.0000 mL | Freq: Once | INTRAVENOUS | Status: AC
  Administered 2011-08-28: 1000 mL via INTRAVENOUS

## 2011-08-28 MED ORDER — FAMOTIDINE 40 MG PO TABS
40.0000 mg | ORAL_TABLET | Freq: Every day | ORAL | Status: DC
  Administered 2011-08-30 – 2011-09-02 (×4): 40 mg via ORAL
  Filled 2011-08-28 (×5): qty 1

## 2011-08-28 MED ORDER — SENNOSIDES-DOCUSATE SODIUM 8.6-50 MG PO TABS
1.0000 | ORAL_TABLET | Freq: Every evening | ORAL | Status: DC | PRN
  Filled 2011-08-28: qty 1

## 2011-08-28 MED ORDER — ONDANSETRON HCL 4 MG/2ML IJ SOLN: 4.0000 mg | Freq: Four times a day (QID) | INTRAMUSCULAR | Status: DC | PRN

## 2011-08-28 MED ORDER — DEXTROSE 5 % IV SOLN
1.0000 g | INTRAVENOUS | Status: DC
  Administered 2011-08-28 – 2011-08-31 (×4): 1 g via INTRAVENOUS
  Filled 2011-08-28 (×5): qty 10

## 2011-08-28 NOTE — Progress Notes (Signed)
Patient refusing breakfast. States it makes him feel poorly.

## 2011-08-28 NOTE — ED Notes (Signed)
Pt on dysphagia 3 diet prior to admit. Pocketing food.

## 2011-08-28 NOTE — Consult Note (Signed)
Referring Physician: Ramiro Harvest    Chief Complaint: Left-sided weakness  HPI: [name] is an 78 y.o. male who was normal until approximately one p.m. today, when his wife started noticing that he was very dysarthric. He was later noticed that he had some left-sided weakness as well. When I asked the patient if he has left-sided weakness, he denies it though he does appear to have some.  LSN: 1 PM tPA Given: No: Outside time window  Past Medical History  Diagnosis Date  . ADENOCARCINOMA, PROSTATE   . ALLERGIC RHINITIS   . B12 DEFICIENCY   . BENIGN PROSTATIC HYPERTROPHY   . CAROTID ARTERY DISEASE   . CEREBROVASCULAR ACCIDENT, HX OF   . CORONARY ARTERY DISEASE   . DIVERTICULOSIS, COLON   . GERD   . HEMORRHOIDS   . HIATAL HERNIA   . HYPERLIPIDEMIA   . HYPERTENSION   . OSTEOPENIA   . PEPTIC ULCER DISEASE   . PERIPHERAL VASCULAR DISEASE   . Personal history of alcoholism   . PROSTATE CANCER, HX OF   . PUD, HX OF   . TRANSIENT ISCHEMIC ATTACK   . Shingles   . Anemia   . AS (aortic stenosis)   . S/P AVR (aortic valve replacement) 09/23/2010  . Complication of anesthesia 09/23/2010    very confused after waking up. Stopped drinking 40 days before this surgery.  . Transfusion reaction, chill fever type 04/28/2011    Fever, rigors, tachycardia,tachypnea but no evidence of hemolysis  . Hemorrhagic cystitis 04/28/2011    Due to previous radiation for PSA recurrence of prostate cancer  . Blood transfusion   . Gross hematuria   . Stroke     Past Surgical History  Procedure Date  . Popliteal synovial cyst excision   . Right carotid endarterectomy 1999    Left carotid total chronic occlusion  . Prostate surgery   . Cardic stent 2004  . Aortic valve replacement 09/23/2010    #80mm [facility] Ease pericardial tissue valve  . Coronary artery bypass graft 09/23/2010    CABG x1 using LIMA to LAD  . Cystoscopy with biopsy 02/15/2011    Procedure: CYSTOSCOPY WITH BIOPSY;   Surgeon: Anner Crete, MD;  Location: [facility];  Service: Urology;  Laterality: N/A;  Cystoscopy, biopsy and fulguration of the bladder    Family History  Problem Relation Age of Onset  . Colon cancer Mother 90  . Diabetes Sister   . Hypertension Sister   . Coronary artery disease Sister   . Multiple sclerosis Sister   . Stroke Brother    Social History:  reports that he quit smoking about 53 years ago. He has never used smokeless tobacco. He reports that he does not drink alcohol or use illicit drugs.  Allergies:  Allergies  Allergen Reactions  . Tolterodine Tartrate     Reaction=unknown    Medications:  Scheduled:   . acidophilus  1 capsule Oral TID WC  . amiodarone  200 mg Oral BID  . aspirin EC  81 mg Oral Daily  . atorvastatin  20 mg Oral Daily  . cefTRIAXone (ROCEPHIN)  IV  1 g Intravenous Q24H  . citalopram  10 mg Oral Daily  . famotidine  40 mg Oral Daily  . feeding supplement  237 mL Oral TID BM  . ferrous sulfate  325 mg Oral BID  . levothyroxine  25 mcg Oral QAC breakfast  . metoprolol succinate  25 mg Oral Daily  .  potassium chloride SA  20 mEq Oral Daily  . sodium chloride  1,000 mL Intravenous Once  . sodium chloride  3 mL Intravenous Q12H  . thiamine  100 mg Oral Daily  . vancomycin  125 mg Oral QID  . DISCONTD: pantoprazole  40 mg Oral Q1200    ROS: A 12 point review of systems is performed and is negative except as noted in history of present illness  Physical Examination: Blood pressure 116/56, pulse 57, temperature 97.4 F (36.3 C), temperature source Oral, resp. rate 18, height 5\' 11"  (1.803 m), weight 65.635 kg (144 lb 11.2 oz), SpO2 99.00%.  Neurologic Examination: Gen: awake, alert, and bed CV: Regular Mental Status: Patient is awake with intact naming and ability to carry on a normal conversation. Oriented to person place and time Speech: Significant dysarthria Cranial Nerves: II-visual fields full, fundi difficult to  visualize II/IV/VI-EOMI, pupils equal round and reactive V/VII-he is not moving the left side of his face as well as a right VIII-intact to voice IX/X-uvula elevates symmetrically XI-shoulder shrug symmetric XII-tongue midline Motor: 5/5 throughout right side, his left arm is 4/5 throughout, though weaker proximally than distally. His left leg has good strength distally mild weakness proximally Sensory: Patient is unable to identify stimuli to his left side, but sometimes identifies them as coming from his right side or his trunk. DTR's: 2+ and symmetric in the patella and biceps Cerebellar: Normal finger-nose-finger on right, unable to perform on left Gait: Not performed gait do to weakness    Laboratory Studies:  Basic Metabolic Panel:  Lab 08/28/11 3086 08/27/11 0500 08/26/11 0801 08/25/11 2210 08/24/11 1121  NA 130* -- 129* 128* 131*  K 3.9 -- 4.1 4.5 4.8  CL 102 -- 97 93* 97  CO2 23 -- 20 19 25   GLUCOSE 115* -- 116* 131* 104*  BUN 34* -- 41* 36* 22  CREATININE 0.80 -- 1.22 1.11 0.9  CALCIUM 7.4* -- 7.9* 8.8 --  MG -- 2.2 -- -- --  PHOS -- 3.6 -- -- --    Liver Function Tests:  Lab 08/26/11 0801 08/25/11 2210 08/24/11 1121  AST 53* 60* 48*  ALT 31 35 28  ALKPHOS 100 129* 91  BILITOT 0.3 0.5 0.8  PROT 5.3* 6.1 6.0  ALBUMIN 2.0* 2.3* 2.7*    Lab 08/25/11 2210 08/24/11 1121  LIPASE 9* 20.0  AMYLASE -- --   No results found for this basename: AMMONIA:3 in the last 168 hours  CBC:  Lab 08/28/11 0910 08/26/11 0801 08/25/11 2210 08/24/11 1121  WBC 11.2* 28.3* 34.9* 24.5*  NEUTROABS -- -- 32.2* 22.9*  HGB 9.0* 10.8* 12.0* 10.4*  HCT 27.2* 32.0* 35.6* 32.3*  MCV 87.5 86.7 87.5 91.8  PLT 332 362 401* 300.0    Cardiac Enzymes: No results found for this basename: CKTOTAL:5,CKMB:5,CKMBINDEX:5,TROPONINI:5 in the last 168 hours  BNP: No components found with this basename: POCBNP:5  CBG: No results found for this basename: GLUCAP:5 in the last 168  hours  Microbiology: Results for orders placed during the hospital encounter of 08/25/11  URINE CULTURE     Status: Normal (Preliminary result)   Collection Time   08/26/11  1:46 AM      Component Value Range Status Comment   Specimen Description URINE, CATHETERIZED   Final    Special Requests NONE   Final    Culture  Setup Time 08/26/2011 09:52   Final    Colony Count >=100,000 COLONIES/ML   Final  Culture     Final    Value: ESCHERICHIA COLI     GRAM NEGATIVE RODS   Report Status PENDING   Incomplete   CULTURE, BLOOD (ROUTINE X 2)     Status: Normal (Preliminary result)   Collection Time   08/26/11  3:10 AM      Component Value Range Status Comment   Specimen Description BLOOD LEFT HAND   Final    Special Requests BOTTLES DRAWN AEROBIC AND ANAEROBIC 3CC   Final    Culture  Setup Time 08/26/2011 08:56   Final    Culture     Final    Value:        BLOOD CULTURE RECEIVED NO GROWTH TO DATE CULTURE WILL BE HELD FOR 5 DAYS BEFORE ISSUING A FINAL NEGATIVE REPORT   Report Status PENDING   Incomplete   CULTURE, BLOOD (ROUTINE X 2)     Status: Normal (Preliminary result)   Collection Time   08/26/11  3:20 AM      Component Value Range Status Comment   Specimen Description BLOOD RIGHT ANTECUBITAL   Final    Special Requests BOTTLES DRAWN AEROBIC AND ANAEROBIC 6CC   Final    Culture  Setup Time 08/26/2011 08:56   Final    Culture     Final    Value:        BLOOD CULTURE RECEIVED NO GROWTH TO DATE CULTURE WILL BE HELD FOR 5 DAYS BEFORE ISSUING A FINAL NEGATIVE REPORT   Report Status PENDING   Incomplete   STOOL CULTURE     Status: Normal (Preliminary result)   Collection Time   08/26/11  7:30 AM      Component Value Range Status Comment   Specimen Description STOOL   Final    Special Requests Normal   Final    Culture NO SUSPICIOUS COLONIES, CONTINUING TO HOLD   Final    Report Status PENDING   Incomplete   CLOSTRIDIUM DIFFICILE BY PCR     Status: Abnormal   Collection Time   08/26/11   7:30 AM      Component Value Range Status Comment   C difficile by pcr POSITIVE (*) NEGATIVE Final     Coagulation Studies: No results found for this basename: LABPROT:5,INR:5 in the last 72 hours  Urinalysis:  Lab 08/26/11 0146  COLORURINE RED*  LABSPEC 1.022  PHURINE 5.5  GLUCOSEU NEGATIVE  HGBUR LARGE*  BILIRUBINUR SMALL*  KETONESUR TRACE*  PROTEINUR 100*  UROBILINOGEN 1.0  NITRITE POSITIVE*  LEUKOCYTESUR LARGE*    Lipid Panel:    Component Value Date/Time   CHOL 119 08/09/2011 1515   TRIG 160.0* 08/09/2011 1515   HDL 59.80 08/09/2011 1515   CHOLHDL 2 08/09/2011 1515   VLDL 32.0 08/09/2011 1515   LDLCALC 27 08/09/2011 1515    HgbA1C:  Lab Results  Component Value Date   HGBA1C 5.6 09/18/2010    Urine Drug Screen:   No results found for this basename: labopia, cocainscrnur, labbenz, amphetmu, thcu, labbarb    Alcohol Level: No results found for this basename: ETH:2 in the last 168 hours  Other results: EKG: normal sinus rhythm.  Imaging: Ct Head Wo Contrast  08/28/2011  *RADIOLOGY REPORT*  Clinical Data: Weakness.  History of prostate cancer.  Slurred speech.  CT HEAD WITHOUT CONTRAST  Technique:  Contiguous axial images were obtained from the base of the skull through the vertex without contrast.  Comparison: 04/29/2011.  Findings: No intracranial hemorrhage.  Global  atrophy.  Remote posterior left opercular and right frontal lobe infarct. Prominent small vessel disease type changes.  No CT evidence of large acute infarct.  No intracranial mass lesion detected on this unenhanced exam.  Vascular calcifications.  Opacification mastoid air cells more notable on the right.  IMPRESSION:  No intracranial hemorrhage.  Global atrophy.  Remote posterior left opercular and right frontal lobe infarct.  Prominent small vessel disease type changes.  No CT evidence of large acute infarct.  Opacification mastoid air cells more notable on the right.  Original Report Authenticated By:  Fuller Canada, M.D.    Assessment: 78 y.o. male with left arm and face weakness and hemi-neglect that is most consistent with a right cortical infarct. At this time, I would recommend he get an MRI of his head to confirm this and to assess for the size of the infarct. He does have a stroke, he'll need a workup and consideration of anticoagulation.   Stroke Risk Factors - atrial fibrillation, carotid stenosis, hyperlipidemia and hypertension  Plan: 1. HgbA1c, fasting lipid panel 2. MRI, MRA  of the brain without contrast 3. PT consult, OT consult, Speech consult 4. Echocardiogram 5. Carotid dopplers 6. Prophylactic therapy-Antiplatelet med: Aspirin - dose 81mg  7. Risk factor modification 8. Telemetry monitoring 9. Frequent neuro checks  Ritta Slot, MD [facility] [phone]  08/28/2011, 8:57 PM

## 2011-08-28 NOTE — Progress Notes (Signed)
Patient refusing lunch

## 2011-08-28 NOTE — Progress Notes (Addendum)
[facility] PROGRESS NOTE  MOHMED FARVER JYN:[id number] DOB: 19-Feb-1926 DOA: 08/25/2011 PCP: Oliver Barre, MD  Assessment/Plan: Principal Problem:  *Clostridium difficile diarrhea Active Problems:  GERD  Hypertension  E-coli UTI  Atrial fibrillation  Protein-calorie malnutrition, severe  Intra-abdominal abscess  Hyponatremia  Hypothyroidism  Acute diarrhea  Leukocytosis  Severe malnutrition  1. C difficille Colitis/ diarrhea - Continue oral vancomycin and florastor. His diarrhea seems to be subsiding  - WBC trending down  - Pt is afebrile  - Will continue current regimen and should condition not improve will consider GI consult.  - Continue florastor  2. H/o intraabdominal abscess  - CT of abdomen performed: No drainable fluid collection or abscess.  3. Leukocytosis  - Related to # 1 and improving on oral vancomycin  4. Hyponatremia  - Likely related to poor oral solute intake. Place on normal saline today  - Check level tomorrow.  5. Severe Malnutrition  - At this point likely related to chronic illness. Should improve with resolution of active c difficil infection.  - Nutritional supplements on board.  6. Atrial fibrillation  - Amiodarone for rate control. ASA  7. Hypothyroidism  - Continue synthroid 8. ECOLI UTI Rocephin     Code Status: FULL Family Communication: Updated patient and wife Disposition Plan: Home vs SNF when medically stable   Brief narrative: Pt is an 78 y/o with h/o hemorrhagic cystitis due to radiation therapy for prostate cancer and chronic indwelling catheter. Presented to the hospital secondary to weakness, decrease oral intake, and diarrhea. Has had recent history of C difficil infection and despite treatment not too long ago presents with another bout of c difficil associated diarrhea. Switched from flagyl to oral vancomycin and currently improving.      Consultants:  None  Procedures:  None  Antibiotics:  IV Rocephin  08/28/11  Oral vancomycin 08/26/11  HPI/Subjective: Patient with poor oral intake, states does not like the food. Patient states loose stools improving.  Objective: Filed Vitals:   08/27/11 1520 08/27/11 2136 08/28/11 0542 08/28/11 0939  BP: 133/77 133/85 103/63 110/63  Pulse: 63 61 60 56  Temp: 97.4 F (36.3 C) 97.3 F (36.3 C) 97.4 F (36.3 C)   TempSrc: Oral Oral Oral   Resp: 18 20 18 18   Height:      Weight:      SpO2: 100% 94% 99%     Intake/Output Summary (Last 24 hours) at 08/28/11 1425 Last data filed at 08/28/11 0542  Gross per 24 hour  Intake    123 ml  Output    800 ml  Net   -677 ml   Filed Weights   08/26/11 0703  Weight: 65.635 kg (144 lb 11.2 oz)    Exam: General: Alert, awake, oriented x3, in no acute distress.Cachetic HEENT: No bruits, no goiter. Heart: Regular rate and rhythm, without murmurs, rubs, gallops. Lungs: Clear to auscultation bilaterally. Abdomen: Soft, nontender, nondistended, positive bowel sounds. Extremities: No clubbing cyanosis or edema with positive pedal pulses. Neuro: Grossly intact, nonfocal  Data Reviewed: Basic Metabolic Panel:  Lab 08/28/11 8657 08/27/11 0500 08/26/11 0801 08/25/11 2210 08/24/11 1121  NA 130* -- 129* 128* 131*  K 3.9 -- 4.1 4.5 4.8  CL 102 -- 97 93* 97  CO2 23 -- 20 19 25   GLUCOSE 115* -- 116* 131* 104*  BUN 34* -- 41* 36* 22  CREATININE 0.80 -- 1.22 1.11 0.9  CALCIUM 7.4* -- 7.9* 8.8 8.5  MG -- 2.2 -- -- --  PHOS -- 3.6 -- -- --   Liver Function Tests:  Lab 08/26/11 0801 08/25/11 2210 08/24/11 1121  AST 53* 60* 48*  ALT 31 35 28  ALKPHOS 100 129* 91  BILITOT 0.3 0.5 0.8  PROT 5.3* 6.1 6.0  ALBUMIN 2.0* 2.3* 2.7*    Lab 08/25/11 2210 08/24/11 1121  LIPASE 9* 20.0  AMYLASE -- --   No results found for this basename: AMMONIA:5 in the last 168 hours CBC:  Lab 08/28/11 0910 08/26/11 0801 08/25/11 2210 08/24/11 1121  WBC 11.2* 28.3* 34.9* 24.5*  NEUTROABS -- -- 32.2* 22.9*  HGB 9.0*  10.8* 12.0* 10.4*  HCT 27.2* 32.0* 35.6* 32.3*  MCV 87.5 86.7 87.5 91.8  PLT 332 362 401* 300.0   Cardiac Enzymes: No results found for this basename: CKTOTAL:5,CKMB:5,CKMBINDEX:5,TROPONINI:5 in the last 168 hours BNP (last 3 results)  Basename 07/21/11 2335 04/29/11 0130 04/28/11 1002  PROBNP 1170.0* 11811.0* 10058.0*   CBG: No results found for this basename: GLUCAP:5 in the last 168 hours  Recent Results (from the past 240 hour(s))  URINE CULTURE     Status: Normal (Preliminary result)   Collection Time   08/26/11  1:46 AM      Component Value Range Status Comment   Specimen Description URINE, CATHETERIZED   Final    Special Requests NONE   Final    Culture  Setup Time 08/26/2011 09:52   Final    Colony Count >=100,000 COLONIES/ML   Final    Culture     Final    Value: ESCHERICHIA COLI     GRAM NEGATIVE RODS   Report Status PENDING   Incomplete   CULTURE, BLOOD (ROUTINE X 2)     Status: Normal (Preliminary result)   Collection Time   08/26/11  3:10 AM      Component Value Range Status Comment   Specimen Description BLOOD LEFT HAND   Final    Special Requests BOTTLES DRAWN AEROBIC AND ANAEROBIC 3CC   Final    Culture  Setup Time 08/26/2011 08:56   Final    Culture     Final    Value:        BLOOD CULTURE RECEIVED NO GROWTH TO DATE CULTURE WILL BE HELD FOR 5 DAYS BEFORE ISSUING A FINAL NEGATIVE REPORT   Report Status PENDING   Incomplete   CULTURE, BLOOD (ROUTINE X 2)     Status: Normal (Preliminary result)   Collection Time   08/26/11  3:20 AM      Component Value Range Status Comment   Specimen Description BLOOD RIGHT ANTECUBITAL   Final    Special Requests BOTTLES DRAWN AEROBIC AND ANAEROBIC [facility]   Final    Culture  Setup Time 08/26/2011 08:56   Final    Culture     Final    Value:        BLOOD CULTURE RECEIVED NO GROWTH TO DATE CULTURE WILL BE HELD FOR 5 DAYS BEFORE ISSUING A FINAL NEGATIVE REPORT   Report Status PENDING   Incomplete   CLOSTRIDIUM DIFFICILE BY PCR      Status: Abnormal   Collection Time   08/26/11  7:30 AM      Component Value Range Status Comment   C difficile by pcr POSITIVE (*) NEGATIVE Final      Studies: Ct Abdomen Pelvis W Contrast  08/26/2011  *RADIOLOGY REPORT*  Clinical Data: C diff colitis, on flagyl, evaluate for recurrent abscess.  Weight loss, history of diverticulitis, prostate  cancer status post XRT.  CT ABDOMEN AND PELVIS WITH CONTRAST  Technique:  Multidetector CT imaging of the abdomen and pelvis was performed following the standard protocol during bolus administration of intravenous contrast.  Contrast: OMNIPAQUE IOHEXOL 300 MG/ML  SOLN  Comparison: 06/22/2011  Findings: Stable partially calcified nodule at the right lung base (series 6/image 14).  Small hiatal hernia.  Liver, spleen, pancreas, and adrenal glands are within normal limits.  Gallbladder is notable for layering sludge and a small gallstone in the gallbladder fundus (series 2/image 28).  No associated inflammatory changes.  No intrahepatic or extrahepatic ductal dilatation.  Kidneys are within normal limits.  No hydronephrosis.  No evidence of bowel obstruction. Large duodenal diverticulum (series 2/image 36).  Diffuse colonic wall thickening with associated inflammatory changes, compatible with reported history of pseudomembranous colitis.  No drainable fluid collection or abscess.  No free air clear.  Small volume abdominopelvic ascites.  Atherosclerotic calcifications of the abdominal aorta and branch vessels.  No suspicious abdominopelvic lymphadenopathy.  Concentric bladder wall thickening, correlate for cystitis (series 2/image 80). High density fluid within the bladder may reflect hemorrhagic (series 2/image 81).  Indwelling Foley catheter with associated tiny foci of nondependent gas.  Status post prostatectomy.  Mild degenerative changes of the visualized thoracolumbar spine, most prominent at L5 S1.  IMPRESSION:  Diffuse colonic wall thickening with associated  inflammatory changes, compatible with reported history of pseudomembranous colitis.  No drainable fluid collection or abscess.  No free air clear.  Thick-walled bladder, correlate for cystitis.  Suspected bladder hemorrhage.  Indwelling Foley catheter.  Additional ancillary findings as above.  Original Report Authenticated By: Charline Bills, M.D.   Dg Chest Port 1 View  08/26/2011  *RADIOLOGY REPORT*  Clinical Data: Weakness and elevated white cell count.  PORTABLE CHEST - 1 VIEW  Comparison: 07/21/2011  Findings: Stable appearance of postoperative changes in the mediastinum.  Normal heart size and pulmonary vascularity. Emphysematous changes and scattered fibrosis in the lungs.  No focal airspace consolidation.  No blunting of costophrenic angles. No pneumothorax.  Calcified and tortuous aorta.  Mediastinal contours appear intact.  Old right rib fractures.  No significant change since previous study.  IMPRESSION: Emphysema and fibrosis in the lungs.  No evidence of active pulmonary disease.  Original Report Authenticated By: Marlon Pel, M.D.    Scheduled Meds:   . acidophilus  1 capsule Oral TID WC  . amiodarone  200 mg Oral BID  . aspirin EC  81 mg Oral Daily  . atorvastatin  20 mg Oral Daily  . cefTRIAXone (ROCEPHIN)  IV  1 g Intravenous Q24H  . citalopram  10 mg Oral Daily  . famotidine  40 mg Oral Daily  . feeding supplement  237 mL Oral TID BM  . ferrous sulfate  325 mg Oral BID  . levothyroxine  25 mcg Oral QAC breakfast  . metoprolol succinate  25 mg Oral Daily  . potassium chloride SA  20 mEq Oral Daily  . sodium chloride  3 mL Intravenous Q12H  . thiamine  100 mg Oral Daily  . vancomycin  125 mg Oral QID  . DISCONTD: pantoprazole  40 mg Oral Q1200   Continuous Infusions:   . dextrose 5 % and 0.9% NaCl 75 mL/hr at 08/27/11 1832  . DISCONTD: dextrose 5 % and 0.45 % NaCl with KCl 20 mEq/L      Principal Problem:  *Clostridium difficile diarrhea Active Problems:   GERD  Hypertension  E-coli UTI  Atrial fibrillation  Protein-calorie malnutrition, severe  Intra-abdominal abscess  Hyponatremia  Hypothyroidism  Acute diarrhea  Leukocytosis  Severe malnutrition    Time spent: 75 MINS    [facility]  [facility] Pager [phone]. If 8PM-8AM, please contact night-coverage at [email or web address], password [facility] 08/28/2011, 2:25 PM  LOS: 3 days

## 2011-08-28 NOTE — ED Notes (Signed)
Soft diet

## 2011-08-28 NOTE — Progress Notes (Signed)
Paged Md regarding change in neuro status.  Order received.

## 2011-08-28 NOTE — Progress Notes (Signed)
Was called per nursing that patient with slurred speech and left upper extremity weakness. Came and examined the patient patient does have slurred speech and left upper extremity weakness. Patient does have multiple risk factors for stroke. Will check a CT of the head. Check MRI of the head. Check carotid Dopplers. Check a 2-D echo. Patient with borderline blood pressure in the 107 sodium a bolus of IV fluids x1. Will consult with neurology for further evaluation and management. We'll make patient n.p.o. Speech therapy evaluation for swallow evaluation. Updated wife at bedside.

## 2011-08-29 ENCOUNTER — Encounter: Payer: Self-pay | Admitting: Internal Medicine

## 2011-08-29 ENCOUNTER — Encounter (HOSPITAL_COMMUNITY): Payer: Self-pay | Admitting: Internal Medicine

## 2011-08-29 DIAGNOSIS — I059 Rheumatic mitral valve disease, unspecified: Secondary | ICD-10-CM

## 2011-08-29 DIAGNOSIS — I6350 Cerebral infarction due to unspecified occlusion or stenosis of unspecified cerebral artery: Secondary | ICD-10-CM

## 2011-08-29 DIAGNOSIS — R4789 Other speech disturbances: Secondary | ICD-10-CM

## 2011-08-29 DIAGNOSIS — I639 Cerebral infarction, unspecified: Secondary | ICD-10-CM

## 2011-08-29 HISTORY — DX: Cerebral infarction, unspecified: I63.9

## 2011-08-29 LAB — BASIC METABOLIC PANEL
BUN: 21 mg/dL (ref 6–23)
Creatinine, Ser: 0.68 mg/dL (ref 0.50–1.35)
GFR calc Af Amer: >90 mL/min (ref >90)
GFR calc non Af Amer: 86 mL/min — ABNORMAL LOW (ref >90)
Potassium: 3.4 mEq/L — ABNORMAL LOW (ref 3.5–5.1)

## 2011-08-29 LAB — CBC WITH DIFFERENTIAL/PLATELET
Basophils Absolute: 0 10*3/uL (ref 0.0–0.1)
Eosinophils Absolute: 0.1 10*3/uL (ref 0.0–0.7)
Lymphocytes Relative: 9 % — ABNORMAL LOW (ref 12–46)
MCHC: 32.8 g/dL (ref 30.0–36.0)
Neutrophils Relative %: 80 % — ABNORMAL HIGH (ref 43–77)
Platelets: 306 10*3/uL (ref 150–400)
RDW: 18 % — ABNORMAL HIGH (ref 11.5–15.5)
WBC: 7.2 10*3/uL (ref 4.0–10.5)

## 2011-08-29 LAB — LIPID PANEL
Cholesterol: 61 mg/dL (ref 0–200)
HDL: 14 mg/dL — ABNORMAL LOW (ref >39)
Total CHOL/HDL Ratio: 4.4 RATIO
VLDL: 18 mg/dL (ref 0–40)

## 2011-08-29 LAB — URINE CULTURE: Colony Count: >=100000

## 2011-08-29 LAB — HEMOGLOBIN A1C: Mean Plasma Glucose: 103 mg/dL (ref <117)

## 2011-08-29 MED ORDER — ENOXAPARIN SODIUM 80 MG/0.8ML ~~LOC~~ SOLN
65.0000 mg | Freq: Two times a day (BID) | SUBCUTANEOUS | Status: DC
  Administered 2011-08-29: 65 mg via SUBCUTANEOUS
  Filled 2011-08-29 (×2): qty 0.8

## 2011-08-29 MED ORDER — STARCH (THICKENING) PO POWD
ORAL | Status: DC | PRN
  Filled 2011-08-29: qty 227

## 2011-08-29 MED ORDER — VANCOMYCIN HCL 500 MG IV SOLR
500.0000 mg | Freq: Four times a day (QID) | Status: DC
  Administered 2011-08-29 – 2011-08-30 (×4): 500 mg via RECTAL
  Filled 2011-08-29 (×9): qty 500

## 2011-08-29 MED ORDER — ASPIRIN 300 MG RE SUPP
300.0000 mg | Freq: Every day | RECTAL | Status: DC
  Administered 2011-08-29: 300 mg via RECTAL
  Filled 2011-08-29 (×2): qty 1

## 2011-08-29 MED ORDER — RESOURCE THICKENUP CLEAR PO POWD
ORAL | Status: DC | PRN
  Filled 2011-08-29 (×2): qty 125

## 2011-08-29 MED ORDER — POTASSIUM CHLORIDE 10 MEQ/100ML IV SOLN
10.0000 meq | INTRAVENOUS | Status: AC
  Administered 2011-08-29 (×3): 10 meq via INTRAVENOUS
  Filled 2011-08-29 (×3): qty 100

## 2011-08-29 NOTE — Progress Notes (Signed)
Notified  Neuro MD on call C. Roseanne Reno, MD of Critical results of MRI. No new orders placed at this time for the patient will continue to monitor the patient. Roosvelt Maser, RN

## 2011-08-29 NOTE — Evaluation (Signed)
Occupational Therapy Evaluation Patient Details Name: [name] MRN: [id number] DOB: 1926/07/12 Today's Date: 08/29/2011 Time: 1478-2956 OT Time Calculation (min): 30 min  OT Assessment / Plan / Recommendation Clinical Impression  Pt admitted on 8/8 with C.diff and incr falls associated with generalized weakness. On 8/10 pt with dysarthria and Lt sided weakness with MRI revealing Rt frontal/parietal infarct. Pt will benefit from skilled OT in the acute setting followed by CIR to maximize I with ADL and ADL mobility prior to d/c     OT Assessment  Patient needs continued OT Services    Follow Up Recommendations  Inpatient Rehab    Barriers to Discharge   wife is unable to perform anything more than light Min A for transfers and ADLs. pt refuses SNF  Equipment Recommendations  Defer to next venue    Recommendations for Other Services Rehab consult  Frequency  Min 3X/week    Precautions / Restrictions Precautions Precautions: Fall Precaution Comments: L sided weakness from CVA Restrictions Weight Bearing Restrictions: No   Pertinent Vitals/Pain Pt denies any pain    ADL  Grooming: Performed;Wash/dry face;Set up;Supervision/safety Where Assessed - Grooming: Supported sitting Lower Body Dressing: Simulated;+1 Total assistance Where Assessed - Lower Body Dressing: Supported sit to Pharmacist, hospital: Chief of Staff: Patient Percentage: 30% Statistician Method: Sit to stand Toileting - Architect and Hygiene: Simulated;+2 Total assistance Where Assessed - Engineer, mining and Hygiene: Standing Equipment Used: Gait belt Transfers/Ambulation Related to ADLs: Pt 70% sit to stand from bed and 30% for stand-pivot from bed to chair with VC and assist take steps with LLE. Lt knee "wobbly" but did not buckle during transfer.  ADL Comments: pt with decr safety awareness and difficulty staying on task throughout eval      OT Diagnosis: Generalized weakness;Disturbance of vision;Acute pain;Hemiplegia non-dominant side  OT Problem List: Decreased strength;Decreased range of motion;Decreased activity tolerance;Impaired balance (sitting and/or standing);Decreased coordination;Decreased cognition;Decreased safety awareness;Decreased knowledge of use of DME or AE;Decreased knowledge of precautions;Impaired sensation;Impaired UE functional use OT Treatment Interventions: Self-care/ADL training;DME and/or AE instruction;Therapeutic activities;Cognitive remediation/compensation;Patient/family education;Balance training   OT Goals Acute Rehab OT Goals OT Goal Formulation: With patient/family Time For Goal Achievement: 09/12/11 Potential to Achieve Goals: Good ADL Goals Pt Will Perform Grooming: with min assist;Sitting at sink;Sitting, chair;Supported ADL Goal: Grooming - Progress: Goal set today Pt Will Perform Upper Body Bathing: with supervision;with set-up;Sitting, chair ADL Goal: Upper Body Bathing - Progress: Goal set today Pt Will Perform Upper Body Dressing: with min assist;with set-up;Sitting, chair ADL Goal: Upper Body Dressing - Progress: Goal set today Pt Will Perform Lower Body Dressing: with max assist;Sit to stand from chair;Sit to stand from bed ADL Goal: Lower Body Dressing - Progress: Goal set today Pt Will Transfer to Toilet: with min assist;3-in-1;Stand pivot transfer ADL Goal: Toilet Transfer - Progress: Goal set today Pt Will Perform Toileting - Clothing Manipulation: with min assist;Standing ADL Goal: Toileting - Clothing Manipulation - Progress: Goal set today Additional ADL Goal #1: Pt will perform dynamic sitting balance EOB for greater than or equal to with Min A in prep for unsupported seated ADLs ADL Goal: Additional Goal #1 - Progress: Goal set today Additional ADL Goal #2: Pt will demonstrate selective attention to functional tasks performed for greater than with no VCs. ADL  Goal: Additional Goal #2 - Progress: Goal set today  Visit Information  Last OT Received On: 08/29/11 Assistance Needed: +2 PT/OT Co-Evaluation/Treatment: Yes  Subjective Data  Subjective: Maybe I can sneak in a beer- they have a lot of nutrition, ya know? Patient Stated Goal: Return home with wife   Prior Functioning  Vision/Perception  Home Living Lives With: Spouse Available Help at Discharge: Family Type of Home: House Home Access: Stairs to enter Secretary/administrator of Steps: 3 Entrance Stairs-Rails: None Home Layout: One level Firefighter: Standard Home Adaptive Equipment: Walker - rolling Prior Function Level of Independence: Needs assistance Comments: pt reports he sometimes needs assist with "getting stuff" so his wife helps. pt did not specify what or why Communication Communication: Expressive difficulties Dominant Hand: Right   Vision - Assessment Additional Comments: unable to fully assess vision secondary to pt's decr attention and ability to follow directions. Visual fields do appear to be intact and pt denies blurry or double vision. Will continue to assess  Cognition  Overall Cognitive Status: Impaired Area of Impairment: Attention;Following commands;Safety/judgement;Awareness of deficits Arousal/Alertness: Awake/alert Orientation Level: Appears intact for tasks assessed Behavior During Session: [facility] for tasks performed Current Attention Level: Sustained Attention - Other Comments: ~15sec Following Commands: Follows one step commands consistently (if pt able to stay on task) Safety/Judgement: Impulsive;Decreased awareness of need for assistance;Decreased safety judgement for tasks assessed Safety/Judgement - Other Comments: pt attempting to get OOB on his own and feels he is safe enough to return home with wife despite needing +2 assist to get OOB and to chair    Extremity/Trunk Assessment Right Upper Extremity Assessment RUE ROM/Strength/Tone:  Within functional levels RUE Sensation: WFL - Light Touch RUE Coordination: WFL - gross/fine motor Left Upper Extremity Assessment LUE ROM/Strength/Tone: Deficits LUE ROM/Strength/Tone Deficits: Unable to fully assess secondary to cognition but pt with 4+/5 biceps and triceps when given max encouragement to fully recruit muscles. However, generally pt presents as 4 to 4-/5. Pt with 2+/5 shoulder flexion. and 3+/5 grip.  LUE Sensation:  (unable to assess secondary to cognition) LUE Coordination: Deficits LUE Coordination Deficits: gross and fine motor deficits  Right Lower Extremity Assessment RLE ROM/Strength/Tone: WFL for tasks assessed RLE Sensation: WFL - Light Touch RLE Coordination: WFL - gross motor Left Lower Extremity Assessment LLE ROM/Strength/Tone: Deficits LLE ROM/Strength/Tone Deficits: ankle DF 3+/5, ankle PF 5/5, knee flex 4/5, knee ext 3+/5, hip flex 3/5 LLE Sensation: Deficits LLE Sensation Deficits: Pt with history of PVD, unable to feel feet.  Intact for rest of LE Trunk Assessment Trunk Assessment: Kyphotic   Mobility Bed Mobility Bed Mobility: Supine to Sit;Sitting - Scoot to Edge of Bed Supine to Sit: 1: +2 Total assist;HOB elevated Supine to Sit: Patient Percentage: 50% Sitting - Scoot to Edge of Bed: 3: Mod assist Details for Bed Mobility Assistance: Pt able to get B LE off of bed, however requires assist for trunk to get into sitting position with verbal and manual cuing for hand placement to self assist trunk.   Transfers Sit to Stand: 1: +2 Total assist;From elevated surface;With upper extremity assist;From bed Sit to Stand: Patient Percentage: 70% Stand to Sit: 1: +2 Total assist;With upper extremity assist;With armrests;To chair/3-in-1 Stand to Sit: Patient Percentage: 40% Details for Transfer Assistance: Assist to rise and steady.  Pts LLE notably unstable, however did not fully buckle once in standing.  Cues for hand placement and also assist for  controlled descent due to pt impulsive to sit without ensuring chair behind him. Pt took some steps from bed to chair with RW, however requires physical assist to maintain LUE placement on RW and to weight  shift R with LLE advancement.     Exercise    Balance Balance Balance Assessed: Yes Static Sitting Balance Static Sitting - Level of Assistance: 1: +1 Total assist Static Sitting - Comment/# of Minutes: Intermittent min assist to total assist for sitting EOB.  Pt requires constant cuing to maintain upright position with noted R lateral lean.  Sat EOB x approx 5-6 mins.   End of Session OT - End of Session Equipment Utilized During Treatment: Gait belt Activity Tolerance: Patient limited by fatigue (and easily distracted) Patient left: in chair;with call bell/phone within reach;with nursing in room;with chair alarm set;with family/visitor present Nurse Communication: Mobility status  GO     Lexxi Koslow 08/29/2011, 2:04 PM

## 2011-08-29 NOTE — Progress Notes (Signed)
  Echocardiogram 2D Echocardiogram has been performed.  [name] 08/29/2011, 11:44 AM

## 2011-08-29 NOTE — Progress Notes (Signed)
[facility] PROGRESS NOTE  [name] RUE:[id number] DOB: May 18, 1926 DOA: 08/25/2011 PCP: Oliver Barre, MD  Assessment/Plan: Principal Problem:  *CVA (cerebrovascular accident) Active Problems:  GERD  Hypertension  E-coli UTI  Atrial fibrillation  Protein-calorie malnutrition, severe  Intra-abdominal abscess  Hyponatremia  Hypothyroidism  Clostridium difficile diarrhea  Acute diarrhea  Leukocytosis  Severe malnutrition   #1 acute CVA Likely embolic in nature. Head CT negative. MRI consistent with nonhemorrhagic acute moderate sized infarct extending from the right sub-insula/right posterior patellar region into the right posterior frontal-parietal lobe. MRA of the head with occluded left internal carotid artery with collateral flow. Almost completely occluded right vertebral artery noted on prior exam. 2-D echo is pending. Fasting lipid panel with LDL of 29. Carotid Doppler is pending however preliminary with no right ICA stenosis. CEA appears open. Right vertebral artery appears occluded. Occlusion of left ICA. Patient failed bedside swallow evaluation. Speech therapy consultation is pending. PT OT. Will place on aspirin rectally. Patient does have a history of A. fib. Patient has prior history of CVAs. Patient was initially deemed not anticoagulating candidate secondary to his hemorrhagic cystitis. In light of patient's recent stroke neurology is recommending that patient likely needs anti-coagulation. I curb sided Dr. Berneice Heinrich of urology and he was felt that due to patient's recent stroke may consider one of the short acting Anticoagulant agents. Will place patient on full dose Lovenox for now and monitor over the next 24 hours his urine as well as his hemoglobin. If H/H  remains stabile may place on one of the newer short acting anticoagulant. Neurology is following. Per neurology.  2C difficille Colitis/ diarrhea - Change oral vancomycin to her vancomycin enema while awaiting  speech evaluation. Continue  florastor. His diarrhea seems to be subsiding  - WBC trending down  - Pt is afebrile   3. H/o intraabdominal abscess  - CT of abdomen performed: No drainable fluid collection or abscess.   4. Leukocytosis  - Related to # 2 and UTI and improving on oral vancomycin   5. Hyponatremia  - Likely related to poor oral solute intake. Slowly improving with IV fluids.  - Check level tomorrow.   6. Severe Malnutrition  - At this point likely related to chronic illness. Should improve with resolution of active c difficil infection.  - Nutritional supplements on board.   7. Atrial fibrillation  - Amiodarone for rate control. ASA . Due to patient's recent stroke we'll place on full dose Lovenox for now monitor patient's urine and his H&H. Stable we'll consider one of the short acting anticoagulant agents.  8. Hypothyroidism  - Continue synthroid  9. ECOLI/Klebsiella oxytoca UTI Rocephin D2     Code Status: FULL Family Communication: Updated patient and wife Disposition Plan: Home vs SNF when medically stable   Brief narrative: Pt is an 78 y/o with h/o hemorrhagic cystitis due to radiation therapy for prostate cancer and chronic indwelling catheter. Presented to the hospital secondary to weakness, decrease oral intake, and diarrhea. Has had recent history of C difficil infection and despite treatment not too long ago presents with another bout of c difficil associated diarrhea. Switched from flagyl to oral vancomycin and currently improving.      Consultants:  Neurology: Dr. Amada Jupiter 08/28/2011  Procedures:  CT head 08/28/2011  MRI/MRA head 08/28/2011  2-D echo 08/29/2011  Carotid Dopplers 08/29/2011  Antibiotics:  IV Rocephin 08/28/11  Oral vancomycin 08/26/11  HPI/Subjective: Patient with slurred speech yesterday and LUE weakness. MRI c/w CVA.  Patient with slurred speech. Patient failed bedside swallow evaluation. Decreased number of loose  stools per nursing. No hematuria noted in Foley catheter. Patient's wife states that the last time he had hematuria was approximately a week ago.  Objective: Filed Vitals:   08/29/11 0004 08/29/11 0258 08/29/11 0444 08/29/11 1010  BP: 128/66 113/67 112/61 127/53  Pulse: 62 60 61 52  Temp: 97.8 F (36.6 C) 97.4 F (36.3 C) 97.5 F (36.4 C) 97.3 F (36.3 C)  TempSrc: Oral Oral Oral Oral  Resp: 18 18 18 18   Height:      Weight:      SpO2: 99% 99% 97% 97%    Intake/Output Summary (Last 24 hours) at 08/29/11 1333 Last data filed at 08/29/11 1117  Gross per 24 hour  Intake   3830 ml  Output   1100 ml  Net   2730 ml   Filed Weights   08/26/11 0703  Weight: 65.635 kg (144 lb 11.2 oz)    Exam: General: Alert, awake, oriented x3, in no acute distress.Cachetic. Dysarthria. HEENT: No bruits, no goiter. Heart: Regular rate and rhythm, without murmurs, rubs, gallops. Lungs: Clear to auscultation bilaterally. Abdomen: Soft, nontender, nondistended, positive bowel sounds. Extremities: No clubbing cyanosis or edema with positive pedal pulses. Neuro: Dysarthria. LUE weakness. Left hemi neglect.  Data Reviewed: Basic Metabolic Panel:  Lab 08/29/11 8295 08/28/11 0910 08/27/11 0500 08/26/11 0801 08/25/11 2210 08/24/11 1121  NA 131* 130* -- 129* 128* 131*  K 3.4* 3.9 -- 4.1 4.5 4.8  CL 105 102 -- 97 93* 97  CO2 21 23 -- 20 19 25   GLUCOSE 93 115* -- 116* 131* 104*  BUN 21 34* -- 41* 36* 22  CREATININE 0.68 0.80 -- 1.22 1.11 0.9  CALCIUM 7.0* 7.4* -- 7.9* 8.8 8.5  MG 2.1 -- 2.2 -- -- --  PHOS -- -- 3.6 -- -- --   Liver Function Tests:  Lab 08/26/11 0801 08/25/11 2210 08/24/11 1121  AST 53* 60* 48*  ALT 31 35 28  ALKPHOS 100 129* 91  BILITOT 0.3 0.5 0.8  PROT 5.3* 6.1 6.0  ALBUMIN 2.0* 2.3* 2.7*    Lab 08/25/11 2210 08/24/11 1121  LIPASE 9* 20.0  AMYLASE -- --   No results found for this basename: AMMONIA:5 in the last 168 hours CBC:  Lab 08/29/11 0525 08/28/11 0910  08/26/11 0801 08/25/11 2210 08/24/11 1121  WBC 7.2 11.2* 28.3* 34.9* 24.5*  NEUTROABS 5.8 -- -- 32.2* 22.9*  HGB 8.8* 9.0* 10.8* 12.0* 10.4*  HCT 26.8* 27.2* 32.0* 35.6* 32.3*  MCV 87.3 87.5 86.7 87.5 91.8  PLT 306 332 362 401* 300.0   Cardiac Enzymes: No results found for this basename: CKTOTAL:5,CKMB:5,CKMBINDEX:5,TROPONINI:5 in the last 168 hours BNP (last 3 results)  Basename 07/21/11 2335 04/29/11 0130 04/28/11 1002  PROBNP 1170.0* 11811.0* 10058.0*   CBG: No results found for this basename: GLUCAP:5 in the last 168 hours  Recent Results (from the past 240 hour(s))  URINE CULTURE     Status: Normal   Collection Time   08/26/11  1:46 AM      Component Value Range Status Comment   Specimen Description URINE, CATHETERIZED   Final    Special Requests NONE   Final    Culture  Setup Time 08/26/2011 09:52   Final    Colony Count >=100,000 COLONIES/ML   Final    Culture     Final    Value: ESCHERICHIA COLI     KLEBSIELLA OXYTOCA  Report Status 08/29/2011 FINAL   Final    Organism ID, Bacteria ESCHERICHIA COLI   Final    Organism ID, Bacteria KLEBSIELLA OXYTOCA   Final   CULTURE, BLOOD (ROUTINE X 2)     Status: Normal (Preliminary result)   Collection Time   08/26/11  3:10 AM      Component Value Range Status Comment   Specimen Description BLOOD LEFT HAND   Final    Special Requests BOTTLES DRAWN AEROBIC AND ANAEROBIC 3CC   Final    Culture  Setup Time 08/26/2011 08:56   Final    Culture     Final    Value:        BLOOD CULTURE RECEIVED NO GROWTH TO DATE CULTURE WILL BE HELD FOR 5 DAYS BEFORE ISSUING A FINAL NEGATIVE REPORT   Report Status PENDING   Incomplete   CULTURE, BLOOD (ROUTINE X 2)     Status: Normal (Preliminary result)   Collection Time   08/26/11  3:20 AM      Component Value Range Status Comment   Specimen Description BLOOD RIGHT ANTECUBITAL   Final    Special Requests BOTTLES DRAWN AEROBIC AND ANAEROBIC [facility]   Final    Culture  Setup Time 08/26/2011 08:56   Final     Culture     Final    Value:        BLOOD CULTURE RECEIVED NO GROWTH TO DATE CULTURE WILL BE HELD FOR 5 DAYS BEFORE ISSUING A FINAL NEGATIVE REPORT   Report Status PENDING   Incomplete   STOOL CULTURE     Status: Normal (Preliminary result)   Collection Time   08/26/11  7:30 AM      Component Value Range Status Comment   Specimen Description STOOL   Final    Special Requests Normal   Final    Culture NO SUSPICIOUS COLONIES, CONTINUING TO HOLD   Final    Report Status PENDING   Incomplete   CLOSTRIDIUM DIFFICILE BY PCR     Status: Abnormal   Collection Time   08/26/11  7:30 AM      Component Value Range Status Comment   C difficile by pcr POSITIVE (*) NEGATIVE Final      Studies: Ct Abdomen Pelvis W Contrast  08/26/2011  *RADIOLOGY REPORT*  Clinical Data: C diff colitis, on flagyl, evaluate for recurrent abscess.  Weight loss, history of diverticulitis, prostate cancer status post XRT.  CT ABDOMEN AND PELVIS WITH CONTRAST  Technique:  Multidetector CT imaging of the abdomen and pelvis was performed following the standard protocol during bolus administration of intravenous contrast.  Contrast: OMNIPAQUE IOHEXOL 300 MG/ML  SOLN  Comparison: 06/22/2011  Findings: Stable partially calcified nodule at the right lung base (series 6/image 14).  Small hiatal hernia.  Liver, spleen, pancreas, and adrenal glands are within normal limits.  Gallbladder is notable for layering sludge and a small gallstone in the gallbladder fundus (series 2/image 28).  No associated inflammatory changes.  No intrahepatic or extrahepatic ductal dilatation.  Kidneys are within normal limits.  No hydronephrosis.  No evidence of bowel obstruction. Large duodenal diverticulum (series 2/image 36).  Diffuse colonic wall thickening with associated inflammatory changes, compatible with reported history of pseudomembranous colitis.  No drainable fluid collection or abscess.  No free air clear.  Small volume abdominopelvic ascites.   Atherosclerotic calcifications of the abdominal aorta and branch vessels.  No suspicious abdominopelvic lymphadenopathy.  Concentric bladder wall thickening, correlate for cystitis (series  2/image 80). High density fluid within the bladder may reflect hemorrhagic (series 2/image 81).  Indwelling Foley catheter with associated tiny foci of nondependent gas.  Status post prostatectomy.  Mild degenerative changes of the visualized thoracolumbar spine, most prominent at L5 S1.  IMPRESSION:  Diffuse colonic wall thickening with associated inflammatory changes, compatible with reported history of pseudomembranous colitis.  No drainable fluid collection or abscess.  No free air clear.  Thick-walled bladder, correlate for cystitis.  Suspected bladder hemorrhage.  Indwelling Foley catheter.  Additional ancillary findings as above.  Original Report Authenticated By: Charline Bills, M.D.   Dg Chest Port 1 View  08/26/2011  *RADIOLOGY REPORT*  Clinical Data: Weakness and elevated white cell count.  PORTABLE CHEST - 1 VIEW  Comparison: 07/21/2011  Findings: Stable appearance of postoperative changes in the mediastinum.  Normal heart size and pulmonary vascularity. Emphysematous changes and scattered fibrosis in the lungs.  No focal airspace consolidation.  No blunting of costophrenic angles. No pneumothorax.  Calcified and tortuous aorta.  Mediastinal contours appear intact.  Old right rib fractures.  No significant change since previous study.  IMPRESSION: Emphysema and fibrosis in the lungs.  No evidence of active pulmonary disease.  Original Report Authenticated By: Marlon Pel, M.D.    Scheduled Meds:    . acidophilus  1 capsule Oral TID WC  . amiodarone  200 mg Oral BID  . aspirin  300 mg Rectal Daily  . atorvastatin  20 mg Oral Daily  . cefTRIAXone (ROCEPHIN)  IV  1 g Intravenous Q24H  . citalopram  10 mg Oral Daily  . famotidine  40 mg Oral Daily  . feeding supplement  237 mL Oral TID BM  .  ferrous sulfate  325 mg Oral BID  . levothyroxine  25 mcg Oral QAC breakfast  . metoprolol succinate  25 mg Oral Daily  . potassium chloride  10 mEq Intravenous Q1 Hr x 3  . potassium chloride SA  20 mEq Oral Daily  . sodium chloride  1,000 mL Intravenous Once  . sodium chloride  3 mL Intravenous Q12H  . thiamine  100 mg Oral Daily  . DISCONTD: aspirin EC  81 mg Oral Daily  . DISCONTD: pantoprazole  40 mg Oral Q1200  . DISCONTD: vancomycin  125 mg Oral QID   Continuous Infusions:    . dextrose 5 % and 0.9% NaCl 75 mL/hr at 08/28/11 1800    Principal Problem:  *CVA (cerebrovascular accident) Active Problems:  GERD  Hypertension  E-coli UTI  Atrial fibrillation  Protein-calorie malnutrition, severe  Intra-abdominal abscess  Hyponatremia  Hypothyroidism  Clostridium difficile diarrhea  Acute diarrhea  Leukocytosis  Severe malnutrition    Time spent: 52 MINS    [facility]  [facility] Pager [phone]. If 8PM-8AM, please contact night-coverage at [email or web address], password [email or web address] 08/29/2011, 1:33 PM  LOS: 4 days

## 2011-08-29 NOTE — Progress Notes (Signed)
Junctional rhythm noted at 1400.  HR 52.  Patient moving around in chair at the time. Denies discomfort although feeling weak and tired today.

## 2011-08-29 NOTE — Assessment & Plan Note (Signed)
Ongoing, for f/u hgb today, cont tx as per urology

## 2011-08-29 NOTE — Evaluation (Signed)
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  Tolerated sitting EOB at min assist to total assist due to R lateral lean and transfer from bed to chair with +2 assist with RW.  Pt will benefit from skilled PT in acute venue to address all mobility deficits.  PT recommends CIR for follow up therapy at D/C.  If pt not appropriated/approved for CIR, then will need SNF for follow up.     PT Assessment  Patient needs continued PT services    Follow Up Recommendations  Inpatient Rehab;Skilled nursing facility;Supervision/Assistance - 24 hour    Barriers to Discharge Decreased caregiver support Pt lives with elderly wife    Equipment Recommendations  Defer to next venue    Recommendations for Other Services     Frequency Min 4X/week    Precautions / Restrictions Precautions Precautions: Fall Precaution Comments: L sided weakness from CVA Restrictions Weight Bearing Restrictions: No   Pertinent Vitals/Pain No pain      Mobility  Bed Mobility Bed Mobility: Supine to Sit;Sitting - Scoot to Edge of Bed Supine to Sit: 1: +2 Total assist;HOB elevated Supine to Sit: Patient Percentage: 50% Sitting - Scoot to Edge of Bed: 3: Mod assist Details for Bed Mobility Assistance: Pt able to get B LE off of bed, however requires assist for trunk to get into sitting position with verbal and manual cuing for hand placement to self assist trunk.   Transfers Transfers: Sit to Stand;Stand to Sit;Stand Pivot Transfers Sit to Stand: 1: +2 Total assist;From elevated surface;With upper extremity assist;From bed Sit to Stand: Patient Percentage: 70% Stand to Sit: 1: +2 Total assist;With upper extremity assist;With armrests;To  chair/3-in-1 Stand to Sit: Patient Percentage: 40% Stand Pivot Transfers: 1: +2 Total assist Stand Pivot Transfers: Patient Percentage: 30% Details for Transfer Assistance: Assist to rise and steady.  Pts LLE notably unstable, however did not fully buckle once in standing.  Cues for hand placement and also assist for controlled descent due to pt impulsive to sit without ensuring chair behind him. Pt took some steps from bed to chair with RW, however requires physical assist to maintain LUE placement on RW and to weight shift R with LLE advancement.   Ambulation/Gait Ambulation/Gait Assistance: Not tested (comment) Stairs: No Wheelchair Mobility Wheelchair Mobility: No Modified Rankin (Stroke Patients Only) Pre-Morbid Rankin Score: Slight disability Modified Rankin: Severe disability (Did not assess amb, transfer +2 assist)    Exercises     PT Diagnosis: Abnormality of gait;Generalized weakness;Hemiplegia non-dominant side  PT Problem List: Decreased strength;Decreased activity tolerance;Decreased balance;Decreased mobility;Decreased coordination;Decreased knowledge of use of DME;Decreased safety awareness;Decreased knowledge of precautions PT Treatment Interventions: DME instruction;Gait training;Functional mobility training;Therapeutic activities;Therapeutic exercise;Balance training;Patient/family education   PT Goals Acute Rehab PT Goals PT Goal Formulation: With patient Time For Goal Achievement: 09/12/11 Potential to Achieve Goals: Fair Pt will go Supine/Side to Sit: with supervision PT Goal: Supine/Side to Sit - Progress: Goal set today Pt will go Sit to Supine/Side: with supervision PT Goal: Sit to Supine/Side - Progress: Goal set today Pt will go Sit to Stand: with min assist PT Goal: Sit to Stand - Progress: Goal set today Pt will go Stand to Sit: with supervision PT Goal: Stand to Sit - Progress: Goal set today Pt will  Transfer Bed to Chair/Chair to Bed: with mod assist PT  Transfer Goal: Bed to Chair/Chair to Bed - Progress: Goal set today Pt will Ambulate: 16 - 50 feet;with mod assist;with least restrictive assistive device PT Goal: Ambulate - Progress: Goal set today  Visit Information  Last PT Received On: 08/29/11 Assistance Needed: +2 PT/OT Co-Evaluation/Treatment: Yes    Subjective Data  Subjective: All the times I fell at home were my fault.  Patient Stated Goal: to go home   Prior Functioning  Home Living Lives With: Spouse Available Help at Discharge: Family Type of Home: House Home Access: Stairs to enter Secretary/administrator of Steps: 3 Entrance Stairs-Rails: None Home Layout: One level Firefighter: Standard Home Adaptive Equipment: Walker - rolling Prior Function Level of Independence: Needs assistance Comments: pt reports he sometimes needs assist with "getting stuff" so his wife helps. pt did not specify what or why Communication Communication: Expressive difficulties Dominant Hand: Right    Cognition  Overall Cognitive Status: Impaired Area of Impairment: Attention;Following commands;Safety/judgement;Awareness of deficits Arousal/Alertness: Awake/alert Orientation Level: Appears intact for tasks assessed Behavior During Session: [facility] for tasks performed Current Attention Level: Sustained Attention - Other Comments: ~15sec Following Commands: Follows one step commands consistently (if pt able to stay on task) Safety/Judgement: Impulsive;Decreased awareness of need for assistance;Decreased safety judgement for tasks assessed Safety/Judgement - Other Comments: pt attempting to get OOB on his own and feels he is safe enough to return home with wife despite needing +2 assist to get OOB and to chair    Extremity/Trunk Assessment Right Upper Extremity Assessment RUE ROM/Strength/Tone: Within functional levels RUE Sensation: WFL - Light Touch RUE Coordination: WFL - gross/fine motor Left Upper Extremity Assessment LUE  ROM/Strength/Tone: Deficits LUE ROM/Strength/Tone Deficits: Unable to fully assess secondary to cognition but pt with 4+/5 biceps and triceps when given max encouragement to fully recruit muscles. However, generally pt presents as 4 to 4-/5. Pt with 2+/5 shoulder flexion. and 3+/5 grip.  LUE Sensation:  (unable to assess secondary to cognition) LUE Coordination: Deficits LUE Coordination Deficits: gross and fine motor deficits  Right Lower Extremity Assessment RLE ROM/Strength/Tone: WFL for tasks assessed RLE Sensation: WFL - Light Touch RLE Coordination: WFL - gross motor Left Lower Extremity Assessment LLE ROM/Strength/Tone: Deficits LLE ROM/Strength/Tone Deficits: ankle DF 3+/5, ankle PF 5/5, knee flex 4/5, knee ext 3+/5, hip flex 3/5 LLE Sensation: Deficits LLE Sensation Deficits: Pt with history of PVD, unable to feel feet.  Intact for rest of LE Trunk Assessment Trunk Assessment: Kyphotic   Balance Balance Balance Assessed: Yes Static Sitting Balance Static Sitting - Level of Assistance: 1: +1 Total assist Static Sitting - Comment/# of Minutes: Intermittent min assist to total assist for sitting EOB.  Pt requires constant cuing to maintain upright position with noted R lateral lean.  Sat EOB x approx 5-6 mins.   End of Session PT - End of Session Equipment Utilized During Treatment: Gait belt Activity Tolerance: Patient limited by fatigue Patient left: in chair;with call bell/phone within reach;with chair alarm set;with family/visitor present Nurse Communication: Mobility status  GP     Page, Meribeth Mattes 08/29/2011, 3:02 PM

## 2011-08-29 NOTE — Progress Notes (Signed)
ANTICOAGULATION CONSULT NOTE - Initial Consult  Pharmacy Consult for Lovenox full dose Indication: atrial fibrillation and stroke  Allergies  Allergen Reactions  . Tolterodine Tartrate     Reaction=unknown    Patient Measurements: Height: 5\' 11"  (180.3 cm) Weight: 144 lb 11.2 oz (65.635 kg) IBW/kg (Calculated) : 75.3   Vital Signs: Temp: 97.3 F (36.3 C) (08/11 1010) Temp src: Oral (08/11 1010) BP: 127/53 mmHg (08/11 1010) Pulse Rate: 52  (08/11 1010)  Labs:  Basename 08/29/11 0525 08/28/11 0910  HGB 8.8* 9.0*  HCT 26.8* 27.2*  PLT 306 332  APTT -- --  LABPROT -- --  INR -- --  HEPARINUNFRC -- --  CREATININE 0.68 0.80  CKTOTAL -- --  CKMB -- --  TROPONINI -- --    Estimated Creatinine Clearance: 64.9 ml/min (by C-G formula based on Cr of 0.68).   Medical History: Past Medical History  Diagnosis Date  . ADENOCARCINOMA, PROSTATE   . ALLERGIC RHINITIS   . B12 DEFICIENCY   . BENIGN PROSTATIC HYPERTROPHY   . CAROTID ARTERY DISEASE   . CEREBROVASCULAR ACCIDENT, HX OF   . CORONARY ARTERY DISEASE   . DIVERTICULOSIS, COLON   . GERD   . HEMORRHOIDS   . HIATAL HERNIA   . HYPERLIPIDEMIA   . HYPERTENSION   . OSTEOPENIA   . PEPTIC ULCER DISEASE   . PERIPHERAL VASCULAR DISEASE   . Personal history of alcoholism   . PROSTATE CANCER, HX OF   . PUD, HX OF   . TRANSIENT ISCHEMIC ATTACK   . Shingles   . Anemia   . AS (aortic stenosis)   . S/P AVR (aortic valve replacement) 09/23/2010  . Complication of anesthesia 09/23/2010    very confused after waking up. Stopped drinking 40 days before this surgery.  . Transfusion reaction, chill fever type 04/28/2011    Fever, rigors, tachycardia,tachypnea but no evidence of hemolysis  . Hemorrhagic cystitis 04/28/2011    Due to previous radiation for PSA recurrence of prostate cancer  . Blood transfusion   . Gross hematuria   . Stroke   . CVA (cerebrovascular accident) 08/29/2011    08/28/11    Medications:  Prescriptions  prior to admission  Medication Sig Dispense Refill  . amiodarone (PACERONE) 200 MG tablet Take 200 mg by mouth 2 (two) times daily.      Marland Kitchen aspirin 81 MG tablet Take 81 mg by mouth daily.      Marland Kitchen atorvastatin (LIPITOR) 20 MG tablet Take 1 tablet (20 mg total) by mouth daily.  90 tablet  2  . citalopram (CELEXA) 10 MG tablet Take 1 tablet (10 mg total) by mouth daily.  90 tablet  3  . feeding supplement (ENSURE COMPLETE) LIQD Take 237 mLs by mouth 3 (three) times daily between meals.      . ferrous sulfate (FERROUSUL) 325 (65 FE) MG tablet Take 1 tablet (325 mg total) by mouth 2 (two) times daily.  60 tablet  11  . Lactobacillus (ACIDOPHILUS PROBIOTIC) 100 MG CAPS Take 1 capsule (100 mg total) by mouth 3 (three) times daily with meals.  90 capsule  0  . levothyroxine (SYNTHROID, LEVOTHROID) 25 MCG tablet Take 1 tablet (25 mcg total) by mouth daily before breakfast.  90 tablet  3  . metoprolol succinate (TOPROL-XL) 25 MG 24 hr tablet Take 1 tablet (25 mg total) by mouth daily.  90 tablet  3  . Multiple Vitamin (MULITIVITAMIN WITH MINERALS) TABS Take 1 tablet by mouth  daily.      . omeprazole (PRILOSEC) 40 MG capsule Take 40 mg by mouth daily.        . potassium chloride SA (K-DUR,KLOR-CON) 20 MEQ tablet Take 1 tablet (20 mEq total) by mouth daily.  90 tablet  3  . thiamine 100 MG tablet Take 1 tablet (100 mg total) by mouth daily.  90 tablet  3  . Vitamins A & D (VITAMIN A & D) cream Apply 1 application topically daily.      . metroNIDAZOLE (FLAGYL) 500 MG tablet Take 1 tablet (500 mg total) by mouth 3 (three) times daily.  30 tablet  0   Scheduled:    . acidophilus  1 capsule Oral TID WC  . amiodarone  200 mg Oral BID  . aspirin  300 mg Rectal Daily  . atorvastatin  20 mg Oral Daily  . cefTRIAXone (ROCEPHIN)  IV  1 g Intravenous Q24H  . citalopram  10 mg Oral Daily  . enoxaparin (LOVENOX) injection  65 mg Subcutaneous Q12H  . famotidine  40 mg Oral Daily  . feeding supplement  237 mL Oral  TID BM  . ferrous sulfate  325 mg Oral BID  . levothyroxine  25 mcg Oral QAC breakfast  . metoprolol succinate  25 mg Oral Daily  . potassium chloride  10 mEq Intravenous Q1 Hr x 3  . potassium chloride SA  20 mEq Oral Daily  . sodium chloride  1,000 mL Intravenous Once  . sodium chloride  3 mL Intravenous Q12H  . thiamine  100 mg Oral Daily  . vancomycin (VANCOCIN) rectal ENEMA  500 mg Rectal Q6H  . DISCONTD: aspirin EC  81 mg Oral Daily  . DISCONTD: vancomycin  125 mg Oral QID   Infusions:    . dextrose 5 % and 0.9% NaCl 75 mL/hr at 08/28/11 1800   PRN: acetaminophen, acetaminophen, albuterol, feeding supplement, guaiFENesin-dextromethorphan, HYDROcodone-acetaminophen, ondansetron (ZOFRAN) IV, ondansetron (ZOFRAN) IV, ondansetron, senna-docusate  Assessment: 85 YOM w/ hx Afib and prior CVAs with acute CVA. MRI c/w nonhemorrhagic acute moderate sized infarct, neuro recommending anticoagulation with full dose Lovenox. Pt was not on anticoagulation PTA for hx of Afib and hx of previous CVAs - not considered suitable anticoagulation candidate d/t hx hemorrhagic cystitis (hx prostate ca/XRT causing bladder damage). No current bleeding reported, wife reported clot a few days ago when flushing foley. Full dose Lovenox dosing per pharmacy requested.  Goal of Therapy:  Anti-Xa level 0.6-1.2 units/ml 4hrs after LMWH dose given Monitor platelets by anticoagulation protocol: Yes   Plan:   Lovenox 65mg  sq q12h  Noted MD plans for neuro to start oral anticoagulation if no bleeding events on full dose lovenox x 24h. Please note no reversal agents for Xarelto (half like 11-13h in elderly) and Pradaxa (half likfe 14-18 hr in elderly). FEIBA can be ordered if life-threatening bleed. Also harder to measure anticoag effects of these agents in pt @ risk for bleeding  Monitor closely for s/sx bleeding  Follow Scr/CBC  Adjust dose as necessary.  Gwen Her PharmD  [phone] 08/29/2011  2:14 PM

## 2011-08-29 NOTE — Progress Notes (Signed)
Patient still exhibiting slurred speech, left sided facial droop and left arm weakness.

## 2011-08-29 NOTE — Progress Notes (Addendum)
VASCULAR LAB PRELIMINARY  PRELIMINARY  PRELIMINARY  PRELIMINARY  Carotid Dopplers completed.    Preliminary report:  There is no right ICA stenosis. CEA appears open. The right vertebral artery appears occluded.  There is occlusion of the left ICA.  Left vertebral artery flow is antegrade.  Klinton Candelas, 08/29/2011, 9:55 AM

## 2011-08-29 NOTE — Progress Notes (Addendum)
Patient continues to have left hemiparesis. He has no other acute complaints today.  Exam: Filed Vitals:   08/29/11 1010  BP: 127/53  Pulse: 52  Temp: 97.3 F (36.3 C)  Resp: 18   Gen. in bed, no acute distress Cranial nerves: No clear field cut, pupils equal round and reactive, left lower facial droop Motor: Patient has weakness in his left arm, but is able to lift it off the bed. He has 3+/5 strength in his left arm and 4+ out of 5 strength in his left hip flexor. He has full strength in the right Sensory: Patient states that he is able to feel sensory on the left, but is unable to identify stimuli, or identifies it in the correct location but on the right side. Cerebellar: Intact finger-nose-finger on the right   MRI brain was reviewed and shows a new moderate-sized ischemic infarct in the MCA distribution on the right.  Impression: 78 year old male with an embolic stroke in the distribution of the right MCA. He is not anticoagulated due to a history of hemorrhagic cystitis. I do suspect an embolic source for his stroke. Therefore, if the risk of hemorrhagic cystitis recurrence is not too high, then anticoagulation in addition to his aspirin would be indicated. WE would want to start anticoagulation a week after his infarct. PA was not given due to the outside of the window.   1) consider anticoagulation if risk of hemorrhagic cystitis is not high 2) he also has high-grade stenoses of the vertebral artery and therefore antiplatelet therapy with aspirin would also need to be continued. 3) permissive hypertension 4) ST, OT, PT 5) carotid Dopplers 6) echo

## 2011-08-29 NOTE — Progress Notes (Signed)
Attempted IV restart, unsuccessful.  Paged IV team.

## 2011-08-29 NOTE — Evaluation (Signed)
Clinical/Bedside Swallow Evaluation Patient Details  Name: [name] MRN: [id number] Date of Birth: [birth year]  Today's Date: 08/29/2011 Time: 4098-1191 SLP Time Calculation (min): 34 min  Past Medical History:  Past Medical History  Diagnosis Date  . ADENOCARCINOMA, PROSTATE   . ALLERGIC RHINITIS   . B12 DEFICIENCY   . BENIGN PROSTATIC HYPERTROPHY   . CAROTID ARTERY DISEASE   . CEREBROVASCULAR ACCIDENT, HX OF   . CORONARY ARTERY DISEASE   . DIVERTICULOSIS, COLON   . GERD   . HEMORRHOIDS   . HIATAL HERNIA   . HYPERLIPIDEMIA   . HYPERTENSION   . OSTEOPENIA   . PEPTIC ULCER DISEASE   . PERIPHERAL VASCULAR DISEASE   . Personal history of alcoholism   . PROSTATE CANCER, HX OF   . PUD, HX OF   . TRANSIENT ISCHEMIC ATTACK   . Shingles   . Anemia   . AS (aortic stenosis)   . S/P AVR (aortic valve replacement) 09/23/2010  . Complication of anesthesia 09/23/2010    very confused after waking up. Stopped drinking 40 days before this surgery.  . Transfusion reaction, chill fever type 04/28/2011    Fever, rigors, tachycardia,tachypnea but no evidence of hemolysis  . Hemorrhagic cystitis 04/28/2011    Due to previous radiation for PSA recurrence of prostate cancer  . Blood transfusion   . Gross hematuria   . Stroke   . CVA (cerebrovascular accident) 08/29/2011    08/28/11   Past Surgical History:  Past Surgical History  Procedure Date  . Popliteal synovial cyst excision   . Right carotid endarterectomy 1999    Left carotid total chronic occlusion  . Prostate surgery   . Cardic stent 2004  . Aortic valve replacement 09/23/2010    #11mm [facility] Ease pericardial tissue valve  . Coronary artery bypass graft 09/23/2010    CABG x1 using LIMA to LAD  . Cystoscopy with biopsy 02/15/2011    Procedure: CYSTOSCOPY WITH BIOPSY;  Surgeon: Anner Crete, MD;  Location: [facility];  Service: Urology;  Laterality: N/A;  Cystoscopy, biopsy and fulguration of the bladder   HPI:  [name] is a 78 y.o. male  has a past medical history of ADENOCARCINOMA, PROSTATE; ALLERGIC RHINITIS; B12 DEFICIENCY; BENIGN PROSTATIC HYPERTROPHY; CAROTID ARTERY DISEASE; CEREBROVASCULAR ACCIDENT, HX OF; CORONARY ARTERY DISEASE; DIVERTICULOSIS, COLON; GERD; HEMORRHOIDS; HIATAL HERNIA; HYPERLIPIDEMIA; HYPERTENSION; OSTEOPENIA; PEPTIC ULCER DISEASE; PERIPHERAL VASCULAR DISEASE; Personal history of alcoholism; PROSTATE CANCER, HX OF; PUD, HX OF; TRANSIENT ISCHEMIC ATTACK; Shingles; Anemia; AS (aortic stenosis); S/P AVR (aortic valve replacement) (09/23/2010); Complication of anesthesia (09/23/2010); Transfusion reaction, chill fever type (04/28/2011); Hemorrhagic cystitis (04/28/2011); Blood transfusion; and Gross hematuria. On 8/10, during this admission, patient became dysarthric and was noted to have left sided weakness.  Acute CVA is suspected.  Stroke Swallow Screen identified s/s of dysphagia and RN called SLP for bedside swallow evaluation.    Assessment / Plan / Recommendation Clinical Impression  Patient exhibits s/s of aspiration with thin liquids, and difficulty chewing solids.  Patient expresses concern regarding MBS, "Keep me out of them rays.  They already messed me [place]."  Will place on a conservative diet and have SLP re-evaluate in am to determine if diet may be advanced or if a MBS is indeed necessary.    Aspiration Risk  Moderate    Diet Recommendation Dysphagia 3 (Mechanical Soft);Nectar-thick liquid (With chopped meats)   Liquid Administration via: Cup Medication Administration: Whole meds with puree Supervision: Full supervision/cueing  for compensatory strategies;Staff feed patient Compensations: Slow rate;Small sips/bites;Check for pocketing Postural Changes and/or Swallow Maneuvers: Seated upright 90 degrees;Upright 30-60 min after meal    Other  Recommendations Recommended Consults: MBS;Other (Comment) (Reassess first at bedside to determine if MBS is needed.) Oral Care  Recommendations: Oral care before and after PO;Staff/trained caregiver to provide oral care Other Recommendations: Order thickener from pharmacy;Have oral suction available;Clarify dietary restrictions   Follow Up Recommendations  Skilled Nursing facility    Frequency and Duration     (Defer treatment plan until MBS complete)   Pertinent Vitals/Pain n/a    SLP Swallow Goals Patient will consume recommended diet without observed clinical signs of aspiration with: Supervision/safety;Moderate assistance Patient will utilize recommended strategies during swallow to increase swallowing safety with: Moderate assistance;Moderate cueing   Swallow Study Prior Functional Status       General Date of Onset: 08/28/11 HPI: [name] is a 78 y.o. male  has a past medical history of ADENOCARCINOMA, PROSTATE; ALLERGIC RHINITIS; B12 DEFICIENCY; BENIGN PROSTATIC HYPERTROPHY; CAROTID ARTERY DISEASE; CEREBROVASCULAR ACCIDENT, HX OF; CORONARY ARTERY DISEASE; DIVERTICULOSIS, COLON; GERD; HEMORRHOIDS; HIATAL HERNIA; HYPERLIPIDEMIA; HYPERTENSION; OSTEOPENIA; PEPTIC ULCER DISEASE; PERIPHERAL VASCULAR DISEASE; Personal history of alcoholism; PROSTATE CANCER, HX OF; PUD, HX OF; TRANSIENT ISCHEMIC ATTACK; Shingles; Anemia; AS (aortic stenosis); S/P AVR (aortic valve replacement) (09/23/2010); Complication of anesthesia (09/23/2010); Transfusion reaction, chill fever type (04/28/2011); Hemorrhagic cystitis (04/28/2011); Blood transfusion; and Gross hematuria. On 8/10, during this admission, patient became dysarthric and was noted to have left sided weakness.  Acute CVA is suspected.  Stroke Swallow Screen identified s/s of dysphagia and RN called SLP for bedside swallow evaluation.  Type of Study: Bedside swallow evaluation Previous Swallow Assessment: Patient was placed on thickened liquids following intubation during hospital admission in April of this year.  Wife reports his dysphagia had resolved and he has tolerated a  regular diet with thin liquids prior to this admit.   Diet Prior to this Study: NPO Temperature Spikes Noted: No Respiratory Status: Room air History of Recent Intubation: No Oral Cavity - Dentition: Adequate natural dentition Self-Feeding Abilities: Needs assist Patient Positioning: Upright in bed Baseline Vocal Quality: Clear;Breathy;Low vocal intensity Volitional Cough: Weak Volitional Swallow: Unable to elicit    Oral/Motor/Sensory Function Overall Oral Motor/Sensory Function: Impaired Labial ROM: Reduced left Labial Symmetry: Abnormal symmetry left Labial Strength: Reduced Labial Sensation: Reduced Lingual ROM: Within Functional Limits Lingual Symmetry: Within Functional Limits Lingual Strength: Reduced Lingual Sensation: Reduced Facial ROM: Reduced left Facial Symmetry: Left droop   Ice Chips Ice chips: Not tested   Thin Liquid Thin Liquid: Impaired Presentation: Spoon;Cup Oral Phase Impairments: Reduced lingual movement/coordination Pharyngeal  Phase Impairments: Suspected delayed Swallow;Multiple swallows;Wet Vocal Quality;Throat Clearing - Immediate;Cough - Immediate    Nectar Thick Nectar Thick Liquid: Within functional limits Presentation: Cup   Honey Thick Honey Thick Liquid: Not tested   Puree Puree: Within functional limits Presentation: Spoon   Solid   GO    Solid: Impaired Presentation: Self Fed Oral Phase Impairments: Reduced lingual movement/coordination Oral Phase Functional Implications:  (Oral delay)       Maryjo Rochester T 08/29/2011,6:26 PM

## 2011-08-29 NOTE — Assessment & Plan Note (Addendum)
Mild worsening, nonbloody per wife without abd pain, vomiting, chronic nausea no change but with recent wt loss and general weakness; for f/u labs today, for GI referral

## 2011-08-29 NOTE — Assessment & Plan Note (Signed)
stable overall by hx and exam, most recent data reviewed with pt, and pt to continue medical treatment as before BP Readings from Last 3 Encounters:  08/29/11 127/53  08/24/11 112/62  07/28/11 104/62

## 2011-08-30 DIAGNOSIS — G8110 Spastic hemiplegia affecting unspecified side: Secondary | ICD-10-CM

## 2011-08-30 LAB — CBC WITH DIFFERENTIAL/PLATELET
Basophils Absolute: 0 10*3/uL (ref 0.0–0.1)
Eosinophils Absolute: 0.1 10*3/uL (ref 0.0–0.7)
Lymphs Abs: 0.7 10*3/uL (ref 0.7–4.0)
MCH: 28.8 pg (ref 26.0–34.0)
MCHC: 32.8 g/dL (ref 30.0–36.0)
MCV: 87.8 fL (ref 78.0–100.0)
Monocytes Absolute: 0.7 10*3/uL (ref 0.1–1.0)
Neutrophils Relative %: 82 % — ABNORMAL HIGH (ref 43–77)
Platelets: 328 10*3/uL (ref 150–400)
RDW: 17.8 % — ABNORMAL HIGH (ref 11.5–15.5)

## 2011-08-30 LAB — BASIC METABOLIC PANEL
BUN: 15 mg/dL (ref 6–23)
Calcium: 7.3 mg/dL — ABNORMAL LOW (ref 8.4–10.5)
Chloride: 103 mEq/L (ref 96–112)
Creatinine, Ser: 0.66 mg/dL (ref 0.50–1.35)
GFR calc Af Amer: >90 mL/min (ref >90)
GFR calc non Af Amer: 87 mL/min — ABNORMAL LOW (ref >90)

## 2011-08-30 LAB — STOOL CULTURE: Special Requests: NORMAL

## 2011-08-30 MED ORDER — VANCOMYCIN 50 MG/ML ORAL SOLUTION
125.0000 mg | Freq: Four times a day (QID) | ORAL | Status: DC
  Administered 2011-08-30 – 2011-09-02 (×12): 125 mg via ORAL
  Filled 2011-08-30 (×15): qty 2.5

## 2011-08-30 MED ORDER — LORAZEPAM 2 MG/ML IJ SOLN
0.5000 mg | Freq: Once | INTRAMUSCULAR | Status: AC
  Administered 2011-08-30: 0.5 mg via INTRAVENOUS
  Filled 2011-08-30: qty 1

## 2011-08-30 MED ORDER — ASPIRIN 325 MG PO TABS
325.0000 mg | ORAL_TABLET | Freq: Every day | ORAL | Status: DC
  Administered 2011-08-30 – 2011-09-02 (×4): 325 mg via ORAL
  Filled 2011-08-30 (×4): qty 1

## 2011-08-30 NOTE — Progress Notes (Signed)
Occupational Therapy Treatment Patient Details Name: [name] MRN: [id number] DOB: 1926/05/31 Today's Date: 08/30/2011 Time: 3086-5784 OT Time Calculation (min): 25 min  OT Assessment / Plan / Recommendation Comments on Treatment Session Pt sleepy this session and difficult to engage pt in activity due to decreased arousal.     Follow Up Recommendations  Inpatient Rehab    Barriers to Discharge       Equipment Recommendations  Defer to next venue    Recommendations for Other Services    Frequency Min 3X/week   Plan Discharge plan remains appropriate    Precautions / Restrictions Precautions Precautions: Fall Precaution Comments: L sided weakness from CVA.        ADL  Grooming: Wash/dry face;Maximal assistance;Other (comment) (fluctuates from min to max assist sit balance) Where Assessed - Grooming: Unsupported sitting Toilet Transfer: Simulated;+2 Total assistance Toilet Transfer: Patient Percentage: 40% Toilet Transfer Method: Stand pivot;Other (comment) (no device) Toileting - Clothing Manipulation and Hygiene: Performed;+2 Total assistance Toileting - Clothing Manipulation and Hygiene: Patient Percentage: 0% ADL Comments: cotx with PT. Pt sitting at EOB with fluctuating balance from min to max assist. Pt more sleepy this session, keeping eyes closed alot. Needs constant verbal and tactile cues to open eyes and focus on task today. Pt not using R UE even that much to help balance himself at EOB. Pt tends to lean to the R in sitting. Up to chair and noted to have runny stool so stood again to get periarea cleaned.     OT Diagnosis:    OT Problem List:   OT Treatment Interventions:     OT Goals ADL Goals ADL Goal: Toilet Transfer - Progress: Progressing toward goals ADL Goal: Toileting - Clothing Manipulation - Progress: Progressing toward goals ADL Goal: Additional Goal #1 - Progress: Progressing toward goals ADL Goal: Additional Goal #2 - Progress: Not  progressing (pt lethargic today)  Visit Information  Last OT Received On: 08/30/11 Assistance Needed: +2    Subjective Data  Subjective: i am sleepy Patient Stated Goal: agreeable to work with PT/OT. none stated independently.   Prior Functioning       Cognition  Overall Cognitive Status: Impaired Area of Impairment: Attention;Following commands;Awareness of deficits;Safety/judgement Arousal/Alertness: Lethargic Behavior During Session: Lethargic Following Commands: Follows one step commands inconsistently Safety/Judgement: Impulsive;Decreased awareness of need for assistance;Decreased awareness of safety precautions    Mobility Bed Mobility Bed Mobility: Supine to Sit Supine to Sit: 1: +2 Total assist;HOB elevated Supine to Sit: Patient Percentage: 30% Details for Bed Mobility Assistance: assist for scooting shoulders around and LEs over to EOB. Also needed assist for trunk to upright and multimodal cues for hand placement and technique. Transfers Transfers: Sit to Stand;Stand to Sit Sit to Stand: 1: +2 Total assist;From bed Sit to Stand: Patient Percentage: 60% Stand to Sit: 1: +2 Total assist Stand to Sit: Patient Percentage: 40% Details for Transfer Assistance: assist to rise and steady upon standing. Noted L LE starting to buckle as he stepped back to recliner. Cues for hand placement and controlling descent. Pt also impulsive, trying to stand from EOB before therapists ready. No device used from EOB to chair. Increased time to transfer. Physical assist to help with weight shifting.    Exercises    Balance Static Sitting Balance Static Sitting - Level of Assistance: 2: Max assist Static Sitting - Comment/# of Minutes: pt sat EOB for about 5 minutes with fluctuating min to max assist for sitting balance. Pt with lean to  R. He needs frequent cues to keep eyes open and try to help balance himself with UEs on bed.  End of Session OT - End of Session Equipment Utilized During  Treatment: Gait belt Activity Tolerance: Patient limited by fatigue Patient left: in chair;with call bell/phone within reach;with chair alarm set  GO     Lennox Laity [phone]  08/30/2011, 12:20 PM

## 2011-08-30 NOTE — Progress Notes (Signed)
Physical Therapy Treatment Patient Details Name: [name] MRN: [id number] DOB: 07-11-1926 Today's Date: 08/30/2011 Time: 4098-1191 PT Time Calculation (min): 24 min  PT Assessment / Plan / Recommendation Comments on Treatment Session  Pt very lethargic during todays session.  continues to be intermittent min to max assist for sitting balance.  He didn't do as well with bed mobility today, however did somewhat better with transfer.      Follow Up Recommendations  Inpatient Rehab;Skilled nursing facility    Barriers to Discharge        Equipment Recommendations  Defer to next venue    Recommendations for Other Services    Frequency Min 4X/week   Plan Discharge plan remains appropriate    Precautions / Restrictions Precautions Precautions: Fall Precaution Comments: L sided weakness from CVA.   Pertinent Vitals/Pain No pain    Mobility  Bed Mobility Bed Mobility: Supine to Sit Supine to Sit: 1: +2 Total assist;HOB elevated Supine to Sit: Patient Percentage: 30% Details for Bed Mobility Assistance: Required increased assist today for B LEs off of bed and for trunk to attain sitting.  Multi modal cuing for hand placement, however pt unable to fully follow commands.   Transfers Transfers: Sit to Stand;Stand to Sit;Stand Pivot Transfers Sit to Stand: 1: +2 Total assist;From bed Sit to Stand: Patient Percentage: 60% Stand to Sit: 1: +2 Total assist Stand to Sit: Patient Percentage: 40% Stand Pivot Transfers: 1: +2 Total assist Stand Pivot Transfers: Patient Percentage: 40% Details for Transfer Assistance: Assist to rise with therapist preventing LLE from buckling with cues for hand placement when sitting/standing.  Pt took some steps from bed to chair with assist for weight shifting and LLE advancement.  Pt continues to be impulsive when trying to sit and had increased knee buckling during transfer.  Ambulation/Gait Ambulation/Gait Assistance: Not tested (comment) Modified  Rankin (Stroke Patients Only) Pre-Morbid Rankin Score: Slight disability Modified Rankin: Severe disability    Exercises     PT Diagnosis:    PT Problem List:   PT Treatment Interventions:     PT Goals Acute Rehab PT Goals PT Goal Formulation: With patient Time For Goal Achievement: 09/12/11 Potential to Achieve Goals: Fair Pt will go Supine/Side to Sit: with supervision PT Goal: Supine/Side to Sit - Progress: Progressing toward goal Pt will go Sit to Stand: with min assist PT Goal: Sit to Stand - Progress: Progressing toward goal Pt will go Stand to Sit: with supervision PT Goal: Stand to Sit - Progress: Progressing toward goal Pt will Transfer Bed to Chair/Chair to Bed: with mod assist PT Transfer Goal: Bed to Chair/Chair to Bed - Progress: Progressing toward goal  Visit Information  Last PT Received On: 08/30/11 Assistance Needed: +2    Subjective Data  Subjective: I'm so tired Patient Stated Goal: n/a   Cognition  Overall Cognitive Status: Impaired Area of Impairment: Attention;Following commands;Awareness of deficits;Safety/judgement Arousal/Alertness: Lethargic Orientation Level: Appears intact for tasks assessed Behavior During Session: Lethargic Current Attention Level: Sustained Following Commands: Follows one step commands inconsistently Safety/Judgement: Impulsive;Decreased awareness of need for assistance;Decreased awareness of safety precautions Safety/Judgement - Other Comments: pt attempting to get OOB on his own and feels he is safe enough to return home with wife despite needing +2 assist to get OOB and to chair    Balance  Balance Balance Assessed: Yes Static Sitting Balance Static Sitting - Level of Assistance: 2: Max assist Static Sitting - Comment/# of Minutes: Sat EOB x 8 mins while  pt attempted to wash face, however continues to demonstrate min to max assist intermittently.  Pt not using R UE properly to assist in holding self up.  However did  note that tactile cuing (putting on gait belt) stimulated pt to sit upright.   End of Session PT - End of Session Equipment Utilized During Treatment: Gait belt Activity Tolerance: Patient limited by fatigue Patient left: in chair;with call bell/phone within reach;with chair alarm set;with family/visitor present Nurse Communication: Mobility status   GP     Page, Meribeth Mattes 08/30/2011, 12:44 PM

## 2011-08-30 NOTE — Progress Notes (Signed)
Awaiting CIR consult - note patient has [employer] Medicare Complete insurance which will require insurance approval for CIR. Patient's wife  is hesitant to SNF placement, as she has not had a good experience in the past.  Clinical Social Work Department CLINICAL SOCIAL WORK PLACEMENT NOTE 08/30/2011  Patient:  [name], [name]  Account Number:  [id number] Admit date:  08/25/2011  Clinical Social Worker:  Orpah Greek  Date/time:  08/30/2011 02:57 PM  Clinical Social Work is seeking post-discharge placement for this patient at the following level of care:   SKILLED NURSING   (*CSW will update this form in Epic as items are completed)   08/30/2011  Patient/family provided with [facility] Department of Clinical Social Work's list of facilities offering this level of care within the geographic area requested by the patient (or if unable, by the patient's family).  08/30/2011  Patient/family informed of their freedom to choose among providers that offer the needed level of care, that participate in Medicare, Medicaid or managed care program needed by the patient, have an available bed and are willing to accept the patient.  08/30/2011  Patient/family informed of [facility]' ownership interest in [facility], as well as of the fact that they are under no obligation to receive care at this facility.  PASARR submitted to [employer] on 08/30/2011 PASARR number received from [employer] on 08/30/2011  FL2 transmitted to all facilities in geographic area requested by pt/family on  08/30/2011 FL2 transmitted to all facilities within larger geographic area on   Patient informed that his/her managed care company has contracts with or will negotiate with  certain facilities, including the following:     Patient/family informed of bed offers received:   Patient chooses bed at  Physician recommends and patient chooses bed at    Patient to be transferred to  on   Patient to be transferred to facility  by   The following physician request were entered in Epic:   Additional Comments:  Unice Bailey, LCSW [facility] Clinical Social Worker cell #: [phone]

## 2011-08-30 NOTE — Consult Note (Signed)
Physical Medicine and Rehabilitation Consult Reason for Consult: Embolic CVA Referring Physician: [facility]   HPI: [name] is a 78 y.o. right-handed male with history of CVA and adenocarcinoma of the prostate as well as hemorrhagic cystitis due to radiation therapy for prostate cancer had indwelling Foley catheter. Presented a date 55 with ongoing bouts of diarrhea and treated for C. difficile in the past. Admitted 08/26/2011 with left-sided weakness and slurred speech. MRI of the brain showed nonhemorrhagic acute moderate size infarct extending from the right sub-insular right posterior opercular region into the right posterior frontal parietal lobe. Also noted remote infarct posterior left sub-insular region. Carotid Dopplers with left occluded ICA. Echocardiogram is pending. Neurology consulted presently on aspirin therapy. Noted C. difficile culture 08/26/11 positive and maintained on Flagyl. Question plan for further anti-coagulation monitored closely due to history of hemorrhagic cystitis. Physical and occupational therapy evaluations are completed with recommendations of physical medicine rehabilitation consult to consider inpatient rehabilitation services   Review of Systems  HENT: Positive for hearing loss.   Cardiovascular: Positive for palpitations.  Gastrointestinal: Positive for diarrhea.  Neurological: Positive for weakness.  Psychiatric/Behavioral: Positive for depression.  All other systems reviewed and are negative.   Past Medical History  Diagnosis Date  . ADENOCARCINOMA, PROSTATE   . ALLERGIC RHINITIS   . B12 DEFICIENCY   . BENIGN PROSTATIC HYPERTROPHY   . CAROTID ARTERY DISEASE   . CEREBROVASCULAR ACCIDENT, HX OF   . CORONARY ARTERY DISEASE   . DIVERTICULOSIS, COLON   . GERD   . HEMORRHOIDS   . HIATAL HERNIA   . HYPERLIPIDEMIA   . HYPERTENSION   . OSTEOPENIA   . PEPTIC ULCER DISEASE   . PERIPHERAL VASCULAR DISEASE   . Personal history of alcoholism   .  PROSTATE CANCER, HX OF   . PUD, HX OF   . TRANSIENT ISCHEMIC ATTACK   . Shingles   . Anemia   . AS (aortic stenosis)   . S/P AVR (aortic valve replacement) 09/23/2010  . Complication of anesthesia 09/23/2010    very confused after waking up. Stopped drinking 40 days before this surgery.  . Transfusion reaction, chill fever type 04/28/2011    Fever, rigors, tachycardia,tachypnea but no evidence of hemolysis  . Hemorrhagic cystitis 04/28/2011    Due to previous radiation for PSA recurrence of prostate cancer  . Blood transfusion   . Gross hematuria   . Stroke   . CVA (cerebrovascular accident) 08/29/2011    08/28/11   Past Surgical History  Procedure Date  . Popliteal synovial cyst excision   . Right carotid endarterectomy 1999    Left carotid total chronic occlusion  . Prostate surgery   . Cardic stent 2004  . Aortic valve replacement 09/23/2010    #7mm [facility] Ease pericardial tissue valve  . Coronary artery bypass graft 09/23/2010    CABG x1 using LIMA to LAD  . Cystoscopy with biopsy 02/15/2011    Procedure: CYSTOSCOPY WITH BIOPSY;  Surgeon: Anner Crete, MD;  Location: [facility];  Service: Urology;  Laterality: N/A;  Cystoscopy, biopsy and fulguration of the bladder   Family History  Problem Relation Age of Onset  . Colon cancer Mother 64  . Diabetes Sister   . Hypertension Sister   . Coronary artery disease Sister   . Multiple sclerosis Sister   . Stroke Brother    Social History:  reports that he quit smoking about 53 years ago. He has never used smokeless tobacco. He  reports that he does not drink alcohol or use illicit drugs. Allergies:  Allergies  Allergen Reactions  . Tolterodine Tartrate     Reaction=unknown   Medications Prior to Admission  Medication Sig Dispense Refill  . amiodarone (PACERONE) 200 MG tablet Take 200 mg by mouth 2 (two) times daily.      Marland Kitchen aspirin 81 MG tablet Take 81 mg by mouth daily.      Marland Kitchen atorvastatin (LIPITOR) 20 MG tablet Take 1  tablet (20 mg total) by mouth daily.  90 tablet  2  . citalopram (CELEXA) 10 MG tablet Take 1 tablet (10 mg total) by mouth daily.  90 tablet  3  . feeding supplement (ENSURE COMPLETE) LIQD Take 237 mLs by mouth 3 (three) times daily between meals.      . ferrous sulfate (FERROUSUL) 325 (65 FE) MG tablet Take 1 tablet (325 mg total) by mouth 2 (two) times daily.  60 tablet  11  . Lactobacillus (ACIDOPHILUS PROBIOTIC) 100 MG CAPS Take 1 capsule (100 mg total) by mouth 3 (three) times daily with meals.  90 capsule  0  . levothyroxine (SYNTHROID, LEVOTHROID) 25 MCG tablet Take 1 tablet (25 mcg total) by mouth daily before breakfast.  90 tablet  3  . metoprolol succinate (TOPROL-XL) 25 MG 24 hr tablet Take 1 tablet (25 mg total) by mouth daily.  90 tablet  3  . Multiple Vitamin (MULITIVITAMIN WITH MINERALS) TABS Take 1 tablet by mouth daily.      Marland Kitchen omeprazole (PRILOSEC) 40 MG capsule Take 40 mg by mouth daily.        . potassium chloride SA (K-DUR,KLOR-CON) 20 MEQ tablet Take 1 tablet (20 mEq total) by mouth daily.  90 tablet  3  . thiamine 100 MG tablet Take 1 tablet (100 mg total) by mouth daily.  90 tablet  3  . Vitamins A & D (VITAMIN A & D) cream Apply 1 application topically daily.      . metroNIDAZOLE (FLAGYL) 500 MG tablet Take 1 tablet (500 mg total) by mouth 3 (three) times daily.  30 tablet  0    Home: Home Living Lives With: Spouse Available Help at Discharge: Family Type of Home: House Home Access: Stairs to enter Secretary/administrator of Steps: 3 Entrance Stairs-Rails: None Home Layout: One level Firefighter: Standard Home Adaptive Equipment: Walker - rolling  Functional History: Prior Function Comments: pt reports he sometimes needs assist with "getting stuff" so his wife helps. pt did not specify what or why Functional Status:  Mobility: Bed Mobility Bed Mobility: Supine to Sit;Sitting - Scoot to Edge of Bed Supine to Sit: 1: +2 Total assist;HOB elevated Supine to  Sit: Patient Percentage: 50% Sitting - Scoot to Edge of Bed: 3: Mod assist Transfers Transfers: Sit to Stand;Stand to Sit;Stand Pivot Transfers Sit to Stand: 1: +2 Total assist;From elevated surface;With upper extremity assist;From bed Sit to Stand: Patient Percentage: 70% Stand to Sit: 1: +2 Total assist;With upper extremity assist;With armrests;To chair/3-in-1 Stand to Sit: Patient Percentage: 40% Stand Pivot Transfers: 1: +2 Total assist Stand Pivot Transfers: Patient Percentage: 30% Ambulation/Gait Ambulation/Gait Assistance: Not tested (comment) Stairs: No Wheelchair Mobility Wheelchair Mobility: No  ADL: ADL Grooming: Performed;Wash/dry face;Set up;Supervision/safety Where Assessed - Grooming: Supported sitting Lower Body Dressing: Simulated;+1 Total assistance Where Assessed - Lower Body Dressing: Supported sit to stand Toilet Transfer: Simulated;+2 Total assistance Toilet Transfer Method: Sit to stand Equipment Used: Gait belt Transfers/Ambulation Related to ADLs: Pt 70% sit to  stand from bed and 30% for stand-pivot from bed to chair with VC and assist take steps with LLE. Lt knee "wobbly" but did not buckle during transfer.  ADL Comments: pt with decr safety awareness and difficulty staying on task throughout eval  Cognition: Cognition Arousal/Alertness: Awake/alert Orientation Level: Oriented to person;Disoriented to place;Disoriented to time;Disoriented to situation Cognition Overall Cognitive Status: Impaired Area of Impairment: Attention;Following commands;Safety/judgement;Awareness of deficits Arousal/Alertness: Awake/alert Orientation Level: Appears intact for tasks assessed Behavior During Session: [facility] for tasks performed Current Attention Level: Sustained Attention - Other Comments: ~15sec Following Commands: Follows one step commands consistently (if pt able to stay on task) Safety/Judgement: Impulsive;Decreased awareness of need for assistance;Decreased  safety judgement for tasks assessed Safety/Judgement - Other Comments: pt attempting to get OOB on his own and feels he is safe enough to return home with wife despite needing +2 assist to get OOB and to chair  Blood pressure 153/84, pulse 72, temperature 97.4 F (36.3 C), temperature source Oral, resp. rate 16, height 5\' 11"  (1.803 m), weight 65.635 kg (144 lb 11.2 oz), SpO2 98.00%. Physical Exam  Constitutional:       frail elderly male  HENT:  Head: Normocephalic.  Neck: Normal range of motion. Neck supple. No thyromegaly present.  Cardiovascular:       Cardiac rate controlled  Pulmonary/Chest: Breath sounds normal. He has no wheezes.  Abdominal: Bowel sounds are normal. He exhibits no distension. There is no tenderness. There is no guarding.  Musculoskeletal: He exhibits no edema.  Neurological:       Patient is alert but kept his eyes closed during most of the exam. He was appropriate for age place and date of birth. He followed simple commands. His mood was flat  Skin: Skin is warm and dry.  somnolent difficult to arouse by voice but follows manual muscle testing. Will open up his eyes for short periods of time. Motor strength is 5/5 in the right deltoid, biceps, triceps, grip and 4 minus/5 in the left deltoid, biceps, triceps, and grip 4 minus over 5 in the left hip flexor, knee extensors, ankle dorsiflexor and 5/5 on the right side. Sensation reduced to light touch in the left upper extremity as well as bilateral feet  Results for orders placed during the hospital encounter of 08/25/11 (from the past 24 hour(s))  CBC WITH DIFFERENTIAL     Status: Abnormal   Collection Time   08/30/11  4:30 AM      Component Value Range   WBC 8.3  4.0 - 10.5 K/uL   RBC 3.37 (*) 4.22 - 5.81 MIL/uL   Hemoglobin 9.7 (*) 13.0 - 17.0 g/dL   HCT 96.0 (*) 45.4 - 09.8 %   MCV 87.8  78.0 - 100.0 fL   MCH 28.8  26.0 - 34.0 pg   MCHC 32.8  30.0 - 36.0 g/dL   RDW 11.9 (*) 14.7 - 82.9 %   Platelets 328   150 - 400 K/uL   Neutrophils Relative 82 (*) 43 - 77 %   Lymphocytes Relative 8 (*) 12 - 46 %   Monocytes Relative 9  3 - 12 %   Eosinophils Relative 1  0 - 5 %   Basophils Relative 0  0 - 1 %   Neutro Abs 6.8  1.7 - 7.7 K/uL   Lymphs Abs 0.7  0.7 - 4.0 K/uL   Monocytes Absolute 0.7  0.1 - 1.0 K/uL   Eosinophils Absolute 0.1  0.0 - 0.7 K/uL  Basophils Absolute 0.0  0.0 - 0.1 K/uL   RBC Morphology ELLIPTOCYTES     WBC Morphology MILD LEFT SHIFT (1-5% METAS, OCC MYELO, OCC BANDS)    BASIC METABOLIC PANEL     Status: Abnormal   Collection Time   08/30/11  4:30 AM      Component Value Range   Sodium 131 (*) 135 - 145 mEq/L   Potassium 3.5  3.5 - 5.1 mEq/L   Chloride 103  96 - 112 mEq/L   CO2 21  19 - 32 mEq/L   Glucose, Bld 88  70 - 99 mg/dL   BUN 15  6 - 23 mg/dL   Creatinine, Ser 1.61  0.50 - 1.35 mg/dL   Calcium 7.3 (*) 8.4 - 10.5 mg/dL   GFR calc non Af Amer 87 (*) >90 mL/min   GFR calc Af Amer >90  >90 mL/min   Dg Chest 2 View  08/29/2011  *RADIOLOGY REPORT*  Clinical Data: Stroke, so we  CHEST - 2 VIEW  Comparison: This chest radiograph 82,013  Findings: Sternotomy wires overlie normal heart silhouette . There is mild scarring at the lung base unchanged from prior.  Lungs hyperinflated.  No effusion, infiltrate, or pneumothorax.  IMPRESSION: No significant change. Emphysema and basilar scarring.  Original Report Authenticated By: Genevive Bi, M.D.   Ct Head Wo Contrast  08/28/2011  *RADIOLOGY REPORT*  Clinical Data: Weakness.  History of prostate cancer.  Slurred speech.  CT HEAD WITHOUT CONTRAST  Technique:  Contiguous axial images were obtained from the base of the skull through the vertex without contrast.  Comparison: 04/29/2011.  Findings: No intracranial hemorrhage.  Global atrophy.  Remote posterior left opercular and right frontal lobe infarct. Prominent small vessel disease type changes.  No CT evidence of large acute infarct.  No intracranial mass lesion detected on  this unenhanced exam.  Vascular calcifications.  Opacification mastoid air cells more notable on the right.  IMPRESSION:  No intracranial hemorrhage.  Global atrophy.  Remote posterior left opercular and right frontal lobe infarct.  Prominent small vessel disease type changes.  No CT evidence of large acute infarct.  Opacification mastoid air cells more notable on the right.  Original Report Authenticated By: Fuller Canada, M.D.   Mr [facility] Wo Contrast  08/28/2011  *RADIOLOGY REPORT*  Clinical Data:  Slurred speech.  Atrial fibrillation.  High blood pressure.  MRI BRAIN WITHOUT CONTRAST MRA HEAD WITHOUT CONTRAST  Technique: Multiplanar, multiecho pulse sequences of the brain and surrounding structures were obtained according to standard protocol without intravenous contrast.  Angiographic images of the head were obtained using MRA technique without contrast.  Comparison: 08/28/2011 CT.  04/21/2009 MR.  MRI HEAD  Findings:  Non hemorrhagic acute moderate size infarct extends from the right sub insular/right posterior opercular region into the right posterior frontal - parietal lobe.  Remote infarct posterior left sub insular region.  Moderate small vessel disease type changes.  Global atrophy without hydrocephalus.  No intracranial hemorrhage.  1 cm right-sided pituitary mass without significant change since prior exam.  This is poorly assessed on the present examination.  Abnormal appearance of the right vertebral artery and left internal carotid artery as discussed below.  Opacification mastoid air cells and middle ear cavities bilaterally.  No obstructing lesions seen in the posterior-superior nasopharynx.  IMPRESSION:  Non hemorrhagic acute moderate size infarct extends from the right sub insular/right posterior opercular region into the right posterior frontal - parietal lobe.  Remote infarct posterior  left sub insular region.  Moderate small vessel disease type changes.  Global atrophy without  hydrocephalus.  1 cm right-sided pituitary mass without significant change since prior exam.  This is poorly assessed on the present examination.  Abnormal appearance of the right vertebral artery and left internal carotid artery as discussed below.  Opacification mastoid air cells and middle ear cavities bilaterally.  No obstructing lesions seen in the posterior-superior nasopharynx.  MRA HEAD  Findings: Occluded left internal carotid artery with collateral flow with visualization of the left carotid terminus, left middle cerebral artery and left anterior cerebral artery.  Ectatic pre cavernous segment right internal carotid artery.  Mild narrowing cavernous segment right internal carotid artery.  Mild ectasia cavernous and supraclinoid segment of the right internal carotid artery.  Mild irregularity and narrowing of the A1 segment of the left anterior cerebral artery.  Bulbous appearance of the anterior communicating artery.  Tiny aneurysm not excluded.  Stability can be confirmed on follow-up.  Middle cerebral artery moderate branch vessel irregularity.  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  No significant stenosis distal left vertebral artery.  Both PICAs visualized with mild irregularity.  No high-grade stenosis of the basilar artery.  Poor delineation of the AICAs.  Mild irregularity superior cerebellar artery bilaterally.  Posterior cerebral artery branch vessel irregularity bilaterally.  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  Bulbous appearance of the anterior communicating artery.  Tiny aneurysm not excluded.  Stability can be confirmed on follow-up.  Middle cerebral artery  moderate branch vessel irregularity.  Poor delineation of the AICAs.  Branch vessel irregularity is noted above.  Critical Value/emergent results were called by telephone at the time of interpretation on 08/28/2011 at 9:55 p.m. to [facility] patients nurse., who verbally acknowledged these results.  Original Report Authenticated By: Fuller Canada, M.D.   Mr Brain Wo Contrast  08/28/2011  *RADIOLOGY REPORT*  Clinical Data:  Slurred speech.  Atrial fibrillation.  High blood pressure.  MRI BRAIN WITHOUT CONTRAST MRA HEAD WITHOUT CONTRAST  Technique: Multiplanar, multiecho pulse sequences of the brain and surrounding structures were obtained according to standard protocol without intravenous contrast.  Angiographic images of the head were obtained using MRA technique without contrast.  Comparison: 08/28/2011 CT.  04/21/2009 MR.  MRI HEAD  Findings:  Non hemorrhagic acute moderate size infarct extends from the right sub insular/right posterior opercular region into the right posterior frontal - parietal lobe.  Remote infarct posterior left sub insular region.  Moderate small vessel disease type changes.  Global atrophy without hydrocephalus.  No intracranial hemorrhage.  1 cm right-sided pituitary mass without significant change since prior exam.  This is poorly assessed on the present examination.  Abnormal appearance of the right vertebral artery and left internal carotid artery as discussed below.  Opacification mastoid air cells and middle ear cavities bilaterally.  No obstructing lesions seen in the posterior-superior nasopharynx.  IMPRESSION:  Non hemorrhagic acute moderate size infarct extends from the right sub insular/right posterior opercular region into the right posterior frontal - parietal lobe.  Remote infarct posterior left sub insular region.  Moderate small vessel disease type changes.  Global atrophy without hydrocephalus.  1 cm right-sided pituitary mass without significant change since prior exam.  This is poorly assessed on the present examination.  Abnormal appearance of the right vertebral artery and left internal carotid artery as discussed below.  Opacification mastoid air cells and middle ear cavities bilaterally.  No obstructing lesions seen in the posterior-superior nasopharynx.  MRA HEAD  Findings: Occluded left internal carotid artery with collateral flow with visualization of the left carotid terminus, left middle cerebral artery and left anterior cerebral artery.  Ectatic pre cavernous segment right internal carotid artery.  Mild narrowing cavernous segment right internal carotid artery.  Mild ectasia cavernous and supraclinoid segment of the right internal carotid artery.  Mild irregularity and narrowing of the A1 segment of the left anterior cerebral artery.  Bulbous appearance of the anterior communicating artery.  Tiny aneurysm not excluded.  Stability can be confirmed on follow-up.  Middle cerebral artery moderate branch vessel irregularity.  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  No significant stenosis distal left vertebral artery.  Both PICAs visualized with mild irregularity.  No high-grade stenosis of the basilar artery.  Poor delineation of the AICAs.  Mild irregularity superior cerebellar artery bilaterally.  Posterior cerebral artery branch vessel irregularity bilaterally.  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  Bulbous appearance of the anterior communicating artery.  Tiny aneurysm not excluded.  Stability can be confirmed on follow-up.  Middle cerebral artery moderate branch vessel irregularity.  Poor delineation of the AICAs.  Branch vessel irregularity is  noted above.  Critical Value/emergent results were called by telephone at the time of interpretation on 08/28/2011 at 9:55 p.m. to [facility] patients nurse., who verbally acknowledged these results.  Original Report Authenticated By: Fuller Canada, M.D.    Assessment/Plan: Diagnosis: left hemiparesis secondary to right cortical and subcortical infarcts 1. Does the need for close, 24 hr/day medical supervision in concert with the patient's rehab needs make it unreasonable for this patient to be served in a less intensive setting? Potentially 2. Co-Morbidities requiring supervision/potential complications: prostate carcinoma with chronic hematuria, status post aortic valve replacement 3. Due to bladder management, bowel management, safety, skin/wound care, disease management, medication administration and patient education, does the patient require 24 hr/day rehab nursing? Potentially 4. Does the patient require coordinated care of a physician, rehab nurse, PT (1-2 hrs/day, 5 days/week), OT (1-2 hrs/day, 5 days/week) and SLP (0.5-1 hrs/day, 5 days/week) to address physical and functional deficits in the context of the above medical diagnosis(es)? Potentially Addressing deficits in the following areas: balance, endurance, locomotion, strength, transferring, bowel/bladder control, bathing, dressing, toileting and cognition 5. Can the patient actively participate in an intensive therapy program of at least 3 hrs of therapy per day at least 5 days per week? No 6. The potential for patient to make measurable gains while on inpatient rehab is fair 7. Anticipated functional outcomes upon discharge from inpatient rehab are min assist mobility with PT, min assist ADLs with OT, alert for ADLs and mobility with SLP. 8. Estimated rehab length of stay to reach the above functional goals is: 3 weeks 9. Does the patient have adequate social supports to accommodate these discharge functional goals?  Potentially 10. Anticipated D/C setting: Home 11. Anticipated post D/C treatments: HH therapy 12. Overall Rehab/Functional Prognosis: fair  RECOMMENDATIONS: This patient's condition is  appropriate for continued rehabilitative care in the following setting: currently the patient lacks the endurance to tolerate a CIR program. Rehabilitation RN will followup on therapy progress. If his endurance and level of alertness remained poor he would be best suited for SNF placement Patient has agreed to participate in recommended program. Potentially Note that insurance prior authorization may be required for reimbursement for recommended care.  Comment:    08/30/2011

## 2011-08-30 NOTE — Progress Notes (Signed)
[facility] PROGRESS NOTE  KA FLAMMER WUJ:[id number] DOB: Sep 04, 1926 DOA: 08/25/2011 PCP: Oliver Barre, MD  Assessment/Plan: Principal Problem:  *CVA (cerebrovascular accident) Active Problems:  GERD  Hypertension  E-coli UTI  Atrial fibrillation  Protein-calorie malnutrition, severe  Intra-abdominal abscess  Hyponatremia  Hypothyroidism  Clostridium difficile diarrhea  Acute diarrhea  Leukocytosis  Severe malnutrition   #1 acute CVA Likely embolic in nature. Head CT negative. MRI consistent with nonhemorrhagic acute moderate sized infarct extending from the right sub-insula/right posterior patellar region into the right posterior frontal-parietal lobe. MRA of the head with occluded left internal carotid artery with collateral flow. Almost completely occluded right vertebral artery noted on prior exam. 2-D echo is pending. Fasting lipid panel with LDL of 29. Carotid Doppler is pending however preliminary with no right ICA stenosis. CEA appears open. Right vertebral artery appears occluded. Occlusion of left ICA. Patient failed bedside swallow evaluation. Speech therapy consultation is pending. PT OT. Will place on aspirin rectally. Patient does have a history of A. fib. Patient has prior history of CVAs. Patient was initially deemed not anticoagulating candidate secondary to his hemorrhagic cystitis. In light of patient's recent stroke neurology is recommending that patient likely needs anti-coagulation to start approximately one week post discharg.. I curb sided Dr. Berneice Heinrich of urology and he was felt that due to patient's recent stroke may consider one of the short acting Anticoagulant agents.  Continue aspirin. Short acting anticoagulant to be started one week post discharge per neurology recommendations. Lovenox has been discontinued. Neurology is following and appreciate their input and recommendations. Per neurology.  2C difficille Colitis/ diarrhea - Change enema back to oral  vancomycin. Continue  florastor. His diarrhea seems to be subsiding  - WBC trending down  - Pt is afebrile   3. H/o intraabdominal abscess  - CT of abdomen performed: No drainable fluid collection or abscess.   4. Leukocytosis  - Related to # 2 and UTI and improving on oral vancomycin and Rocephin.  5. Hyponatremia  - Likely related to poor oral solute intake. Slowly improving with IV fluids.  - Check level tomorrow.   6. Severe Malnutrition  - At this point likely related to chronic illness. Should improve with resolution of active c difficil infection.  - Nutritional supplements on board.   7. Atrial fibrillation  - Amiodarone for rate control. ASA .  Stable will consider one of the short acting anticoagulant agents one week post discharge per neurology recommendations..  8. Hypothyroidism  - Continue synthroid  9. ECOLI/Klebsiella oxytoca UTI Rocephin D3/7     Code Status: FULL Family Communication: Updated patient and wife Disposition Plan: SNF versus in patient rehabilitation when medically stable   Brief narrative: Pt is an 78 y/o with h/o hemorrhagic cystitis due to radiation therapy for prostate cancer and chronic indwelling catheter. Presented to the hospital secondary to weakness, decrease oral intake, and diarrhea. Has had recent history of C difficil infection and despite treatment not too long ago presents with another bout of c difficil associated diarrhea. Switched from flagyl to oral vancomycin and currently improving.  Patient also noted to have an acute stroke: 08/28/2011.     Consultants:  Neurology: Dr. Amada Jupiter 08/28/2011  Procedures:  CT head 08/28/2011  MRI/MRA head 08/28/2011  2-D echo 08/29/2011  Carotid Dopplers 08/29/2011  Antibiotics:  IV Rocephin 08/28/11  Oral vancomycin 08/26/11  HPI/Subjective: Patient's dysarthria improved. Patient with decreased in number of stools. Patient states left upper extremity weakness slowly  improving.  Objective: Filed Vitals:   08/29/11 2103 08/30/11 0607 08/30/11 1053 08/30/11 1344  BP: 125/70 153/84 118/70 98/63  Pulse: 59 72 69 77  Temp: 97.6 F (36.4 C) 97.4 F (36.3 C) 97.9 F (36.6 C) 97.6 F (36.4 C)  TempSrc: Oral Oral Axillary Axillary  Resp:      Height:      Weight:      SpO2: 99% 98% 98% 98%    Intake/Output Summary (Last 24 hours) at 08/30/11 1742 Last data filed at 08/30/11 1057  Gross per 24 hour  Intake   1420 ml  Output    350 ml  Net   1070 ml   Filed Weights   08/26/11 0703  Weight: 65.635 kg (144 lb 11.2 oz)    Exam: General: Alert, awake, oriented x3, in no acute distress.Cachetic. Dysarthria improving. HEENT: No bruits, no goiter. Heart: Regular rate and rhythm, without murmurs, rubs, gallops. Lungs: Clear to auscultation bilaterally. Abdomen: Soft, nontender, nondistended, positive bowel sounds. Extremities: No clubbing cyanosis or edema with positive pedal pulses. Neuro: Dysarthria improved. LUE weakness. Left hemi neglect.  Data Reviewed: Basic Metabolic Panel:  Lab 08/30/11 1610 08/29/11 0525 08/28/11 0910 08/27/11 0500 08/26/11 0801 08/25/11 2210  NA 131* 131* 130* -- 129* 128*  K 3.5 3.4* 3.9 -- 4.1 4.5  CL 103 105 102 -- 97 93*  CO2 21 21 23  -- 20 19  GLUCOSE 88 93 115* -- 116* 131*  BUN 15 21 34* -- 41* 36*  CREATININE 0.66 0.68 0.80 -- 1.22 1.11  CALCIUM 7.3* 7.0* 7.4* -- 7.9* 8.8  MG -- 2.1 -- 2.2 -- --  PHOS -- -- -- 3.6 -- --   Liver Function Tests:  Lab 08/26/11 0801 08/25/11 2210 08/24/11 1121  AST 53* 60* 48*  ALT 31 35 28  ALKPHOS 100 129* 91  BILITOT 0.3 0.5 0.8  PROT 5.3* 6.1 6.0  ALBUMIN 2.0* 2.3* 2.7*    Lab 08/25/11 2210 08/24/11 1121  LIPASE 9* 20.0  AMYLASE -- --   No results found for this basename: AMMONIA:5 in the last 168 hours CBC:  Lab 08/30/11 0430 08/29/11 0525 08/28/11 0910 08/26/11 0801 08/25/11 2210 08/24/11 1121  WBC 8.3 7.2 11.2* 28.3* 34.9* --  NEUTROABS 6.8 5.8 -- --  32.2* 22.9*  HGB 9.7* 8.8* 9.0* 10.8* 12.0* --  HCT 29.6* 26.8* 27.2* 32.0* 35.6* --  MCV 87.8 87.3 87.5 86.7 87.5 --  PLT 328 306 332 362 401* --   Cardiac Enzymes: No results found for this basename: CKTOTAL:5,CKMB:5,CKMBINDEX:5,TROPONINI:5 in the last 168 hours BNP (last 3 results)  Basename 07/21/11 2335 04/29/11 0130 04/28/11 1002  PROBNP 1170.0* 11811.0* 10058.0*   CBG: No results found for this basename: GLUCAP:5 in the last 168 hours  Recent Results (from the past 240 hour(s))  URINE CULTURE     Status: Normal   Collection Time   08/26/11  1:46 AM      Component Value Range Status Comment   Specimen Description URINE, CATHETERIZED   Final    Special Requests NONE   Final    Culture  Setup Time 08/26/2011 09:52   Final    Colony Count >=100,000 COLONIES/ML   Final    Culture     Final    Value: ESCHERICHIA COLI     KLEBSIELLA OXYTOCA   Report Status 08/29/2011 FINAL   Final    Organism ID, Bacteria ESCHERICHIA COLI   Final    Organism ID, Bacteria KLEBSIELLA OXYTOCA  Final   CULTURE, BLOOD (ROUTINE X 2)     Status: Normal (Preliminary result)   Collection Time   08/26/11  3:10 AM      Component Value Range Status Comment   Specimen Description BLOOD LEFT HAND   Final    Special Requests BOTTLES DRAWN AEROBIC AND ANAEROBIC 3CC   Final    Culture  Setup Time 08/26/2011 08:56   Final    Culture     Final    Value:        BLOOD CULTURE RECEIVED NO GROWTH TO DATE CULTURE WILL BE HELD FOR 5 DAYS BEFORE ISSUING A FINAL NEGATIVE REPORT   Report Status PENDING   Incomplete   CULTURE, BLOOD (ROUTINE X 2)     Status: Normal (Preliminary result)   Collection Time   08/26/11  3:20 AM      Component Value Range Status Comment   Specimen Description BLOOD RIGHT ANTECUBITAL   Final    Special Requests BOTTLES DRAWN AEROBIC AND ANAEROBIC [facility]   Final    Culture  Setup Time 08/26/2011 08:56   Final    Culture     Final    Value:        BLOOD CULTURE RECEIVED NO GROWTH TO DATE CULTURE  WILL BE HELD FOR 5 DAYS BEFORE ISSUING A FINAL NEGATIVE REPORT   Report Status PENDING   Incomplete   STOOL CULTURE     Status: Normal   Collection Time   08/26/11  7:30 AM      Component Value Range Status Comment   Specimen Description STOOL   Final    Special Requests Normal   Final    Culture     Final    Value: NO SALMONELLA, SHIGELLA, CAMPYLOBACTER, YERSINIA, OR E.COLI 0157:H7 ISOLATED   Report Status 08/30/2011 FINAL   Final   CLOSTRIDIUM DIFFICILE BY PCR     Status: Abnormal   Collection Time   08/26/11  7:30 AM      Component Value Range Status Comment   C difficile by pcr POSITIVE (*) NEGATIVE Final      Studies: Ct Abdomen Pelvis W Contrast  08/26/2011  *RADIOLOGY REPORT*  Clinical Data: C diff colitis, on flagyl, evaluate for recurrent abscess.  Weight loss, history of diverticulitis, prostate cancer status post XRT.  CT ABDOMEN AND PELVIS WITH CONTRAST  Technique:  Multidetector CT imaging of the abdomen and pelvis was performed following the standard protocol during bolus administration of intravenous contrast.  Contrast: OMNIPAQUE IOHEXOL 300 MG/ML  SOLN  Comparison: 06/22/2011  Findings: Stable partially calcified nodule at the right lung base (series 6/image 14).  Small hiatal hernia.  Liver, spleen, pancreas, and adrenal glands are within normal limits.  Gallbladder is notable for layering sludge and a small gallstone in the gallbladder fundus (series 2/image 28).  No associated inflammatory changes.  No intrahepatic or extrahepatic ductal dilatation.  Kidneys are within normal limits.  No hydronephrosis.  No evidence of bowel obstruction. Large duodenal diverticulum (series 2/image 36).  Diffuse colonic wall thickening with associated inflammatory changes, compatible with reported history of pseudomembranous colitis.  No drainable fluid collection or abscess.  No free air clear.  Small volume abdominopelvic ascites.  Atherosclerotic calcifications of the abdominal aorta and  branch vessels.  No suspicious abdominopelvic lymphadenopathy.  Concentric bladder wall thickening, correlate for cystitis (series 2/image 80). High density fluid within the bladder may reflect hemorrhagic (series 2/image 81).  Indwelling Foley catheter with associated tiny  foci of nondependent gas.  Status post prostatectomy.  Mild degenerative changes of the visualized thoracolumbar spine, most prominent at L5 S1.  IMPRESSION:  Diffuse colonic wall thickening with associated inflammatory changes, compatible with reported history of pseudomembranous colitis.  No drainable fluid collection or abscess.  No free air clear.  Thick-walled bladder, correlate for cystitis.  Suspected bladder hemorrhage.  Indwelling Foley catheter.  Additional ancillary findings as above.  Original Report Authenticated By: Charline Bills, M.D.   Dg Chest Port 1 View  08/26/2011  *RADIOLOGY REPORT*  Clinical Data: Weakness and elevated white cell count.  PORTABLE CHEST - 1 VIEW  Comparison: 07/21/2011  Findings: Stable appearance of postoperative changes in the mediastinum.  Normal heart size and pulmonary vascularity. Emphysematous changes and scattered fibrosis in the lungs.  No focal airspace consolidation.  No blunting of costophrenic angles. No pneumothorax.  Calcified and tortuous aorta.  Mediastinal contours appear intact.  Old right rib fractures.  No significant change since previous study.  IMPRESSION: Emphysema and fibrosis in the lungs.  No evidence of active pulmonary disease.  Original Report Authenticated By: Marlon Pel, M.D.    Scheduled Meds:    . acidophilus  1 capsule Oral TID WC  . amiodarone  200 mg Oral BID  . aspirin  325 mg Oral Daily  . atorvastatin  20 mg Oral Daily  . cefTRIAXone (ROCEPHIN)  IV  1 g Intravenous Q24H  . citalopram  10 mg Oral Daily  . famotidine  40 mg Oral Daily  . feeding supplement  237 mL Oral TID BM  . ferrous sulfate  325 mg Oral BID  . levothyroxine  25 mcg Oral  QAC breakfast  . LORazepam  0.5 mg Intravenous Once  . metoprolol succinate  25 mg Oral Daily  . potassium chloride SA  20 mEq Oral Daily  . sodium chloride  3 mL Intravenous Q12H  . thiamine  100 mg Oral Daily  . vancomycin (VANCOCIN) rectal ENEMA  500 mg Rectal Q6H  . DISCONTD: aspirin  300 mg Rectal Daily   Continuous Infusions:    . dextrose 5 % and 0.9% NaCl 75 mL/hr at 08/30/11 1656    Principal Problem:  *CVA (cerebrovascular accident) Active Problems:  GERD  Hypertension  E-coli UTI  Atrial fibrillation  Protein-calorie malnutrition, severe  Intra-abdominal abscess  Hyponatremia  Hypothyroidism  Clostridium difficile diarrhea  Acute diarrhea  Leukocytosis  Severe malnutrition    Time spent: 33 MINS    [facility]  [facility] Pager [phone]. If 8PM-8AM, please contact night-coverage at [email or web address], password [facility] 08/30/2011, 5:42 PM  LOS: 5 days

## 2011-08-30 NOTE — Progress Notes (Signed)
Patient lacks endurance for inpt rehab today when seen in consult. I will follow up in the morning to assess his lethargy and if improved I will pursue insurance authorization for inpt rehab. Otherwise he will need SNF rehab. Please call me with questions. [phone]

## 2011-08-30 NOTE — Progress Notes (Signed)
The patient is awake in bed today, eating breakfast. He does not voice any acute complaints to me. He denies new neurological symptoms or pain.     Exam: Filed Vitals:   08/30/11 1053  BP: 118/70  Pulse: 69  Temp: 97.9 F (36.6 C)  Resp:    Gen.: In bed, eating breakfast Mental status: Awake, alert. Oriented to "hospital" but not [facility] long, states he doesn't know the name of it, oriented to [place], gets month wrong "July". gets correct year. Cranial nerves: Visual fields appear full, EOMI, PERRL, mild left lower facial droop, improved from previous days, palate symmetric Motor: 4/5 in the left upper extremity, 5 out of 5 otherwise Sensory: Patient correctly identifies where light touch sensation is today, though he endorses normal sensation it is difficult to know given his neglect. Cerebellar: intact FNF on right(only mild endpoint tremor)    MRA head: Old left ICA occlusion, highly stenotic right vertebral. LDL 29, A1c 5.2 Carotid Dopplers pending Echo - dilated left atrium  Impression: 78 year old male with a likely embolic nondominant MCA infarct. He has a history of hemorrhagic cystitis and for that reason was not anticoagulated. If the risk of recurrence of hemorrhagic cystitis is felt to be low, and I would recommend anticoagulation when the patient is a week out from the stroke. I would also continue ASA given that he has a highly stenotic vertebral artery.  He already has significant improvement compared to previous days which is a good sign.  1: Await carotid Dopplers 2: Continue ASA 3: One week out from his stroke, would begin anticoagulation 4: PT, OT, ST  Ritta Slot, MD [facility] [phone]

## 2011-08-30 NOTE — Progress Notes (Signed)
Clinical Social Work Department BRIEF PSYCHOSOCIAL ASSESSMENT 08/30/2011  Patient:  [name], [name]     Account Number:  [id number]     Admit date:  08/25/2011  Clinical Social Worker:  Orpah Greek  Date/Time:  08/30/2011 02:52 PM  Referred by:  Physician  Date Referred:  08/30/2011 Referred for  SNF Placement   Other Referral:   Interview type:  Family Other interview type:    PSYCHOSOCIAL DATA Living Status:  WIFE Admitted from facility:   Level of care:   Primary support name:  Travus Oren (wife) h#: [id number] c#: [phone] Primary support relationship to patient:  SPOUSE Degree of support available:   good    CURRENT CONCERNS Current Concerns  Post-Acute Placement   Other Concerns:    SOCIAL WORK ASSESSMENT / PLAN CSW spoke with patient's wife re: discharge planning. Note PT recommending [facility] vs. SNF at discharge. Awaiting CIR consult.   Assessment/plan status:  Information/Referral to [facility] Other assessment/ plan:   Information/referral to community resources:   CSW completed FL2 and faxed information out to [facility].    PATIENT'S/FAMILY'S RESPONSE TO PLAN OF CARE: Patient's wife is hesitant to SNF placement, as she has not had a good experience in the past. CSW to follow-up with wife after CIR consult/insurance response.        Unice Bailey, LCSW [facility] Clinical Social Worker cell #: [phone]

## 2011-08-30 NOTE — Progress Notes (Signed)
SLP Cancellation Note  Treatment cancelled today due to pt is sound asleep and spouse is not in room currently.  Spoke to [employer] who reports pt tolerated po well, needed assist to give self liquids without spilling.  SlP to follow for diet tolerance.  Thanks. [name], MS [facility] SLP [phone]

## 2011-08-31 DIAGNOSIS — E876 Hypokalemia: Secondary | ICD-10-CM | POA: Clinically undetermined

## 2011-08-31 LAB — BASIC METABOLIC PANEL
CO2: 21 mEq/L (ref 19–32)
Calcium: 7.2 mg/dL — ABNORMAL LOW (ref 8.4–10.5)
Creatinine, Ser: 0.62 mg/dL (ref 0.50–1.35)
GFR calc non Af Amer: 89 mL/min — ABNORMAL LOW (ref >90)

## 2011-08-31 LAB — CBC
MCV: 86.7 fL (ref 78.0–100.0)
Platelets: 276 10*3/uL (ref 150–400)
RDW: 18.1 % — ABNORMAL HIGH (ref 11.5–15.5)
WBC: 9.2 10*3/uL (ref 4.0–10.5)

## 2011-08-31 MED ORDER — POTASSIUM CHLORIDE CRYS ER 20 MEQ PO TBCR
40.0000 meq | EXTENDED_RELEASE_TABLET | ORAL | Status: AC
  Administered 2011-08-31 (×2): 40 meq via ORAL
  Filled 2011-08-31 (×2): qty 2

## 2011-08-31 NOTE — Progress Notes (Signed)
[facility] PROGRESS NOTE  [name]:[id number] DOB: [birth year] DOA: 08/25/2011 PCP: Oliver Barre, MD  Assessment/Plan: Principal Problem:  *CVA (cerebrovascular accident) Active Problems:  GERD  Hypertension  E-coli UTI  Atrial fibrillation  Protein-calorie malnutrition, severe  Intra-abdominal abscess  Hyponatremia  Hypothyroidism  Clostridium difficile diarrhea  Acute diarrhea  Leukocytosis  Severe malnutrition  Hypokalemia   #1 acute CVA Likely embolic in nature. Head CT negative. MRI consistent with nonhemorrhagic acute moderate sized infarct extending from the right sub-insula/right posterior patellar region into the right posterior frontal-parietal lobe. MRA of the head with occluded left internal carotid artery with collateral flow. Almost completely occluded right vertebral artery noted on prior exam. 2-D echo is pending. Fasting lipid panel with LDL of 29. Carotid Doppler is pending however preliminary with no right ICA stenosis. CEA appears open. Right vertebral artery appears occluded. Occlusion of left ICA. Patient failed bedside swallow evaluation. Speech therapy consultation is pending. PT OT. Will place on aspirin rectally. Patient does have a history of A. fib. Patient has prior history of CVAs. Patient was initially deemed not anticoagulating candidate secondary to his hemorrhagic cystitis. In light of patient's recent stroke neurology is recommending that patient likely needs anti-coagulation to start approximately one week post discharg.. I curb sided Dr. Berneice Heinrich of urology and he was felt that due to patient's recent stroke may consider one of the short acting Anticoagulant agents.  Continue aspirin. Short acting anticoagulant to be started one week post discharge per neurology recommendations. Lovenox has been discontinued. Neurology is following and appreciate their input and recommendations. Per neurology.  2C difficille Colitis/ diarrhea - Continue oral  vancomycin. Continue  florastor. His diarrhea seems to be subsiding  - WBC trending down  - Pt is afebrile   3. H/o intraabdominal abscess  - CT of abdomen performed: No drainable fluid collection or abscess.   4. Leukocytosis  - Related to # 2 and UTI and improving on oral vancomycin and Rocephin.  5. Hyponatremia  - Likely related to poor oral solute intake. Slowly improving with IV fluids.  - Check level tomorrow.   6. Severe Malnutrition  - At this point likely related to chronic illness. Should improve with resolution of active c difficil infection.  - Nutritional supplements on board.   7. Atrial fibrillation  - Amiodarone for rate control. ASA .  Stable will consider one of the short acting anticoagulant agents one week post discharge per neurology recommendations..  8. Hypothyroidism  - Continue synthroid  9. ECOLI/Klebsiella oxytoca UTI Rocephin D4/7. Will change to oral antibiotic tomorrow.     Code Status: FULL Family Communication: Updated patient and wife Disposition Plan: Patient refused skilled nursing facility. When this medically stable will go home with home health.   Brief narrative: Pt is an 78 y/o with h/o hemorrhagic cystitis due to radiation therapy for prostate cancer and chronic indwelling catheter. Presented to the hospital secondary to weakness, decrease oral intake, and diarrhea. Has had recent history of C difficil infection and despite treatment not too long ago presents with another bout of c difficil associated diarrhea. Switched from flagyl to oral vancomycin and currently improving.  Patient also noted to have an acute stroke: 08/28/2011.     Consultants:  Neurology: Dr. Amada Jupiter 08/28/2011  Procedures:  CT head 08/28/2011  MRI/MRA head 08/28/2011  2-D echo 08/29/2011  Carotid Dopplers 08/29/2011  Antibiotics:  IV Rocephin 08/28/11  Oral vancomycin 08/26/11  HPI/Subjective: Patient's dysarthria improved. Patient with  decreased in  number of stools. Patient states left upper extremity weakness slowly improving. Patient has been refusing to go to a skilled nursing facility and states he rather die than go back to a skilled nursing facility. Patient is adamant on going home.  Objective: Filed Vitals:   08/30/11 2335 08/31/11 0335 08/31/11 0541 08/31/11 1039  BP: 107/65 129/71 130/71 113/66  Pulse: 69 68 68 61  Temp: 97.4 F (36.3 C) 97.7 F (36.5 C) 98 F (36.7 C) 97.8 F (36.6 C)  TempSrc: Oral Oral Oral Oral  Resp:  12 14 16   Height:      Weight:      SpO2: 98% 97% 97% 98%    Intake/Output Summary (Last 24 hours) at 08/31/11 1558 Last data filed at 08/31/11 1039  Gross per 24 hour  Intake 2193.75 ml  Output    150 ml  Net 2043.75 ml   Filed Weights   08/26/11 0703  Weight: 65.635 kg (144 lb 11.2 oz)    Exam: General: Alert, awake, oriented x3, in no acute distress.Cachetic. Dysarthria improving. HEENT: No bruits, no goiter. Heart: Regular rate and rhythm, without murmurs, rubs, gallops. Lungs: Clear to auscultation bilaterally. Abdomen: Soft, nontender, nondistended, positive bowel sounds. Extremities: No clubbing cyanosis or edema with positive pedal pulses. Neuro: Dysarthria improved. LUE weakness. Left hemi neglect improved..  Data Reviewed: Basic Metabolic Panel:  Lab 08/31/11 2440 08/30/11 0430 08/29/11 0525 08/28/11 0910 08/27/11 0500 08/26/11 0801  NA 133* 131* 131* 130* -- 129*  K 3.1* 3.5 3.4* 3.9 -- 4.1  CL 105 103 105 102 -- 97  CO2 21 21 21 23  -- 20  GLUCOSE 125* 88 93 115* -- 116*  BUN 11 15 21  34* -- 41*  CREATININE 0.62 0.66 0.68 0.80 -- 1.22  CALCIUM 7.2* 7.3* 7.0* 7.4* -- 7.9*  MG 1.9 -- 2.1 -- 2.2 --  PHOS -- -- -- -- 3.6 --   Liver Function Tests:  Lab 08/26/11 0801 08/25/11 2210  AST 53* 60*  ALT 31 35  ALKPHOS 100 129*  BILITOT 0.3 0.5  PROT 5.3* 6.1  ALBUMIN 2.0* 2.3*    Lab 08/25/11 2210  LIPASE 9*  AMYLASE --   No results found for this  basename: AMMONIA:5 in the last 168 hours CBC:  Lab 08/31/11 0450 08/30/11 0430 08/29/11 0525 08/28/11 0910 08/26/11 0801 08/25/11 2210  WBC 9.2 8.3 7.2 11.2* 28.3* --  NEUTROABS -- 6.8 5.8 -- -- 32.2*  HGB 9.1* 9.7* 8.8* 9.0* 10.8* --  HCT 27.4* 29.6* 26.8* 27.2* 32.0* --  MCV 86.7 87.8 87.3 87.5 86.7 --  PLT 276 328 306 332 362 --   Cardiac Enzymes: No results found for this basename: CKTOTAL:5,CKMB:5,CKMBINDEX:5,TROPONINI:5 in the last 168 hours BNP (last 3 results)  Basename 07/21/11 2335 04/29/11 0130 04/28/11 1002  PROBNP 1170.0* 11811.0* 10058.0*   CBG: No results found for this basename: GLUCAP:5 in the last 168 hours  Recent Results (from the past 240 hour(s))  URINE CULTURE     Status: Normal   Collection Time   08/26/11  1:46 AM      Component Value Range Status Comment   Specimen Description URINE, CATHETERIZED   Final    Special Requests NONE   Final    Culture  Setup Time 08/26/2011 09:52   Final    Colony Count >=100,000 COLONIES/ML   Final    Culture     Final    Value: ESCHERICHIA COLI     KLEBSIELLA OXYTOCA  Report Status 08/29/2011 FINAL   Final    Organism ID, Bacteria ESCHERICHIA COLI   Final    Organism ID, Bacteria KLEBSIELLA OXYTOCA   Final   CULTURE, BLOOD (ROUTINE X 2)     Status: Normal (Preliminary result)   Collection Time   08/26/11  3:10 AM      Component Value Range Status Comment   Specimen Description BLOOD LEFT HAND   Final    Special Requests BOTTLES DRAWN AEROBIC AND ANAEROBIC 3CC   Final    Culture  Setup Time 08/26/2011 08:56   Final    Culture     Final    Value:        BLOOD CULTURE RECEIVED NO GROWTH TO DATE CULTURE WILL BE HELD FOR 5 DAYS BEFORE ISSUING A FINAL NEGATIVE REPORT   Report Status PENDING   Incomplete   CULTURE, BLOOD (ROUTINE X 2)     Status: Normal (Preliminary result)   Collection Time   08/26/11  3:20 AM      Component Value Range Status Comment   Specimen Description BLOOD RIGHT ANTECUBITAL   Final    Special  Requests BOTTLES DRAWN AEROBIC AND ANAEROBIC [facility]   Final    Culture  Setup Time 08/26/2011 08:56   Final    Culture     Final    Value:        BLOOD CULTURE RECEIVED NO GROWTH TO DATE CULTURE WILL BE HELD FOR 5 DAYS BEFORE ISSUING A FINAL NEGATIVE REPORT   Report Status PENDING   Incomplete   STOOL CULTURE     Status: Normal   Collection Time   08/26/11  7:30 AM      Component Value Range Status Comment   Specimen Description STOOL   Final    Special Requests Normal   Final    Culture     Final    Value: NO SALMONELLA, SHIGELLA, CAMPYLOBACTER, YERSINIA, OR E.COLI 0157:H7 ISOLATED   Report Status 08/30/2011 FINAL   Final   CLOSTRIDIUM DIFFICILE BY PCR     Status: Abnormal   Collection Time   08/26/11  7:30 AM      Component Value Range Status Comment   C difficile by pcr POSITIVE (*) NEGATIVE Final      Studies: Ct Abdomen Pelvis W Contrast  08/26/2011  *RADIOLOGY REPORT*  Clinical Data: C diff colitis, on flagyl, evaluate for recurrent abscess.  Weight loss, history of diverticulitis, prostate cancer status post XRT.  CT ABDOMEN AND PELVIS WITH CONTRAST  Technique:  Multidetector CT imaging of the abdomen and pelvis was performed following the standard protocol during bolus administration of intravenous contrast.  Contrast: OMNIPAQUE IOHEXOL 300 MG/ML  SOLN  Comparison: 06/22/2011  Findings: Stable partially calcified nodule at the right lung base (series 6/image 14).  Small hiatal hernia.  Liver, spleen, pancreas, and adrenal glands are within normal limits.  Gallbladder is notable for layering sludge and a small gallstone in the gallbladder fundus (series 2/image 28).  No associated inflammatory changes.  No intrahepatic or extrahepatic ductal dilatation.  Kidneys are within normal limits.  No hydronephrosis.  No evidence of bowel obstruction. Large duodenal diverticulum (series 2/image 36).  Diffuse colonic wall thickening with associated inflammatory changes, compatible with reported  history of pseudomembranous colitis.  No drainable fluid collection or abscess.  No free air clear.  Small volume abdominopelvic ascites.  Atherosclerotic calcifications of the abdominal aorta and branch vessels.  No suspicious abdominopelvic lymphadenopathy.  Concentric  bladder wall thickening, correlate for cystitis (series 2/image 80). High density fluid within the bladder may reflect hemorrhagic (series 2/image 81).  Indwelling Foley catheter with associated tiny foci of nondependent gas.  Status post prostatectomy.  Mild degenerative changes of the visualized thoracolumbar spine, most prominent at L5 S1.  IMPRESSION:  Diffuse colonic wall thickening with associated inflammatory changes, compatible with reported history of pseudomembranous colitis.  No drainable fluid collection or abscess.  No free air clear.  Thick-walled bladder, correlate for cystitis.  Suspected bladder hemorrhage.  Indwelling Foley catheter.  Additional ancillary findings as above.  Original Report Authenticated By: Charline Bills, M.D.   Dg Chest Port 1 View  08/26/2011  *RADIOLOGY REPORT*  Clinical Data: Weakness and elevated white cell count.  PORTABLE CHEST - 1 VIEW  Comparison: 07/21/2011  Findings: Stable appearance of postoperative changes in the mediastinum.  Normal heart size and pulmonary vascularity. Emphysematous changes and scattered fibrosis in the lungs.  No focal airspace consolidation.  No blunting of costophrenic angles. No pneumothorax.  Calcified and tortuous aorta.  Mediastinal contours appear intact.  Old right rib fractures.  No significant change since previous study.  IMPRESSION: Emphysema and fibrosis in the lungs.  No evidence of active pulmonary disease.  Original Report Authenticated By: Marlon Pel, M.D.    Scheduled Meds:    . acidophilus  1 capsule Oral TID WC  . amiodarone  200 mg Oral BID  . aspirin  325 mg Oral Daily  . atorvastatin  20 mg Oral Daily  . cefTRIAXone (ROCEPHIN)  IV  1 g  Intravenous Q24H  . citalopram  10 mg Oral Daily  . famotidine  40 mg Oral Daily  . feeding supplement  237 mL Oral TID BM  . ferrous sulfate  325 mg Oral BID  . levothyroxine  25 mcg Oral QAC breakfast  . metoprolol succinate  25 mg Oral Daily  . potassium chloride SA  20 mEq Oral Daily  . potassium chloride  40 mEq Oral Q4H  . sodium chloride  3 mL Intravenous Q12H  . thiamine  100 mg Oral Daily  . vancomycin  125 mg Oral Q6H  . DISCONTD: vancomycin (VANCOCIN) rectal ENEMA  500 mg Rectal Q6H   Continuous Infusions:    . dextrose 5 % and 0.9% NaCl 75 mL/hr at 08/31/11 0548    Principal Problem:  *CVA (cerebrovascular accident) Active Problems:  GERD  Hypertension  E-coli UTI  Atrial fibrillation  Protein-calorie malnutrition, severe  Intra-abdominal abscess  Hyponatremia  Hypothyroidism  Clostridium difficile diarrhea  Acute diarrhea  Leukocytosis  Severe malnutrition  Hypokalemia    Time spent: 20 MINS    [facility]  [facility] Pager [phone]. If 8PM-8AM, please contact night-coverage at [email or web address], password [facility] 08/31/2011, 3:58 PM  LOS: 6 days

## 2011-08-31 NOTE — Progress Notes (Signed)
CSW spoke with patient & wife re: SNF bed offers - wife expressed interest in [facility] and there is a bed available though patient is refusing SNF. RNCM & MD aware. Patient to d/c home with home health when medically stable.  Clinical Social Work Department CLINICAL SOCIAL WORK PLACEMENT NOTE 08/31/2011  Patient:  [name], [name]  Account Number:  [id number] Admit date:  08/25/2011  Clinical Social Worker:  Orpah Greek  Date/time:  08/30/2011 02:57 PM  Clinical Social Work is seeking post-discharge placement for this patient at the following level of care:   SKILLED NURSING   (*CSW will update this form in Epic as items are completed)   08/30/2011  Patient/family provided with [facility] Department of Clinical Social Work's list of facilities offering this level of care within the geographic area requested by the patient (or if unable, by the patient's family).  08/30/2011  Patient/family informed of their freedom to choose among providers that offer the needed level of care, that participate in Medicare, Medicaid or managed care program needed by the patient, have an available bed and are willing to accept the patient.  08/30/2011  Patient/family informed of [facility]' ownership interest in [facility], as well as of the fact that they are under no obligation to receive care at this facility.  PASARR submitted to EDS on 08/30/2011 PASARR number received from E[employer] on 08/30/2011  FL2 transmitted to all facilities in geographic area requested by pt/family on  08/30/2011 FL2 transmitted to all facilities within larger geographic area on   Patient informed that his/her managed care company has contracts with or will negotiate with  certain facilities, including the following:     Patient/family informed of bed offers received:  08/31/2011 Patient chooses bed at  Physician recommends and patient chooses bed at    Patient to be transferred to  on   Patient to  be transferred to facility by   The following physician request were entered in Epic:   Additional Comments:    Unice Bailey, LCSW [facility] Clinical Social Worker cell #: [phone]

## 2011-08-31 NOTE — Progress Notes (Signed)
Speech Language Pathology Dysphagia Treatment Patient Details Name: [name] MRN: [id number] DOB: [birth year] Today's Date: 08/31/2011 Time: 1478-2956 SLP Time Calculation (min): 33 min  Assessment / Plan / Recommendation Clinical Impression  Pt in bed, asleep but easily awakened to slp stimulation.  Eggs retained in  (left) oral sulci without pt attempt to clear and he stated they had been there for well over one hour.  SLP instructed pt to spit out but needed assistance to clear.  Pt not using upper denture due to pain with insertion- new with this admit- per pt.  Pt states he can not chew hard items, offered meats to be ground or pureed but pt stated "You are wasting your time, I won't eat meat."  Pt further states he will not drink thickened liquids as he does not like the taste.  Reviewed concen for aspiration and increased ramficiations due to bedridden status/multiple comorbidiities to which pt verbalized understanding.   Pt also declines to have MBS - and if he will not drink thickened drinks likely willl not change his outcome.  SLP offered option of thickened drinks with meals and thin water between meals after oral care- he again declined.  Coughing episodes with chocolate and occassional coughing with thin drinks reported by pt prior to admission-causing SLP to question some baseline dysphagia/aspiration.  Pt observed with thin water today, overt cough via straw but not via cup.  Pt declined Ensure, nectar thick apple juice and solid cracker.    Rec MD address this issue with pt and family.  Options  present:  advance diet with acceptance of increased asp risk vs continue modified diet currently (understanding pt will NOT drink thickened drinks).    Rec pt NOT USE straws regardless as he was overtly coughing via straw today - suspect aspiration during swallow d/t decr laryngeal elevation.    SLP to follow up next date for education with pt and family after MD decides care plan.  Thanks!      Diet Recommendation  Continue with Current Diet: Dysphagia 3 (mechanical soft);Nectar-thick liquid Initiate / Change Diet: Dysphagia 3 (mechanical soft);Thin liquid (If MD,family,pt desires with incr asp risk)    SLP Plan Continue with current plan of care   Pertinent Vitals/Pain Afebrile, decreased   Swallowing Goals  SLP Swallowing Goals Swallow Study Goal #3 - Progress: Progressing toward goal Goal #4: Pt and family will determine if pt to have diet advanced with increased asp/asp pna risk.   General Temperature Spikes Noted: No Respiratory Status: Room air Behavior/Cognition: Alert;Agitated;Impulsive;Confused Oral Cavity - Dentition: Dentures, bottom;Dentures, top (pt will not wear top dentures, stating they hurt to place)  Oral Cavity - Oral Hygiene   Eggs located in oral cavity from breakfast over an hour ago per pt.  Dysphagia Treatment Treatment focused on: Skilled observation of diet tolerance Treatment Methods/Modalities: Skilled observation;Differential diagnosis Patient observed directly with PO's: Yes Type of PO's observed: Thin liquids (pt declined cracker, nectar juice) Feeding: Able to feed self Pharyngeal Phase Signs & Symptoms: Immediate cough (after straw use, impulsive and takes large boluses) Amount of cueing: Maximal (to take small sips)   GO     [name], MS [facility] SLP [phone]

## 2011-08-31 NOTE — Progress Notes (Signed)
I met with patient at bedside. He is not at a level to be able to participate in a more intense inpt rehab at this time. I recommend SNF rehab. I discussed with patient but he is not in agreement. He states he has been to SNF before and refuses to ever go again. Please call [phone] with questions.

## 2011-08-31 NOTE — Consult Note (Signed)
Urology Consult  Referring physician: [facility] Reason for referral: chronic radiation hemorrhagic prostatitis  Chief Complaint: post cva need for anticoagulation  History of Present Illness:  78 yo male who developed life threatening gross hematuria several years post EBRT for prostate cancer, requiring multiple cystoscopies, bladder evacuations/irrigations, catheters, and now continuing hyperbaric O2 treatments to try to stop his hematuria.    Currently hospitalized for CVA, embolic, A. Fib, and C. Diff diarrhea, and recent intra-abdominal abscess drained percutaneously.   Past Medical History  Diagnosis Date  . ADENOCARCINOMA, PROSTATE   . ALLERGIC RHINITIS   . B12 DEFICIENCY   . BENIGN PROSTATIC HYPERTROPHY   . CAROTID ARTERY DISEASE   . CEREBROVASCULAR ACCIDENT, HX OF   . CORONARY ARTERY DISEASE   . DIVERTICULOSIS, COLON   . GERD   . HEMORRHOIDS   . HIATAL HERNIA   . HYPERLIPIDEMIA   . HYPERTENSION   . OSTEOPENIA   . PEPTIC ULCER DISEASE   . PERIPHERAL VASCULAR DISEASE   . Personal history of alcoholism   . PROSTATE CANCER, HX OF   . PUD, HX OF   . TRANSIENT ISCHEMIC ATTACK   . Shingles   . Anemia   . AS (aortic stenosis)   . S/P AVR (aortic valve replacement) 09/23/2010  . Complication of anesthesia 09/23/2010    very confused after waking up. Stopped drinking 40 days before this surgery.  . Transfusion reaction, chill fever type 04/28/2011    Fever, rigors, tachycardia,tachypnea but no evidence of hemolysis  . Hemorrhagic cystitis 04/28/2011    Due to previous radiation for PSA recurrence of prostate cancer  . Blood transfusion   . Gross hematuria   . Stroke   . CVA (cerebrovascular accident) 08/29/2011    08/28/11   Past Surgical History  Procedure Date  . Popliteal synovial cyst excision   . Right carotid endarterectomy 1999    Left carotid total chronic occlusion  . Prostate surgery   . Cardic stent 2004  . Aortic valve replacement 09/23/2010    #17mm [facility] Ease pericardial tissue valve  . Coronary artery bypass graft 09/23/2010    CABG x1 using LIMA to LAD  . Cystoscopy with biopsy 02/15/2011    Procedure: CYSTOSCOPY WITH BIOPSY;  Surgeon: Anner Crete, MD;  Location: [facility];  Service: Urology;  Laterality: N/A;  Cystoscopy, biopsy and fulguration of the bladder    Medications: I have reviewed the patient's current medications. Allergies:  Allergies  Allergen Reactions  . Tolterodine Tartrate     Reaction=unknown    Family History  Problem Relation Age of Onset  . Colon cancer Mother 74  . Diabetes Sister   . Hypertension Sister   . Coronary artery disease Sister   . Multiple sclerosis Sister   . Stroke Brother    Social History:  reports that he quit smoking about 53 years ago. He has never used smokeless tobacco. He reports that he does not drink alcohol or use illicit drugs.  ROS: All systems are reviewed and negative except as noted. Weak, but awake and alert.   Physical Exam:  Vital signs in last 24 hours: Temp:  [97.4 F (36.3 C)-98 F (36.7 C)] 97.6 F (36.4 C) (08/13 1612) Pulse Rate:  [53-69] 53  (08/13 1612) Resp:  [12-16] 16  (08/13 1612) BP: (107-130)/(65-71) 110/70 mmHg (08/13 1612) SpO2:  [97 %-99 %] 99 % (08/13 1612)  Cardiovascular: Skin warm; not flushed Respiratory: Breaths quiet; no shortness of breath Abdomen: No  masses Neurological: Normal sensation to touch Musculoskeletal: Normal motor function arms and legs Lymphatics: No inguinal adenopathy Skin: No rashes Genitourinary:urine clear. Foley in place.   Laboratory Data:  Results for orders placed during the hospital encounter of 08/25/11 (from the past 72 hour(s))  BASIC METABOLIC PANEL     Status: Abnormal   Collection Time   08/29/11  5:25 AM      Component Value Range Comment   Sodium 131 (*) 135 - 145 mEq/L    Potassium 3.4 (*) 3.5 - 5.1 mEq/L    Chloride 105  96 - 112 mEq/L    CO2 21  19 - 32 mEq/L    Glucose, Bld 93  70 - 99  mg/dL    BUN 21  6 - 23 mg/dL    Creatinine, Ser 1.19  0.50 - 1.35 mg/dL    Calcium 7.0 (*) 8.4 - 10.5 mg/dL    GFR calc non Af Amer 86 (*) >90 mL/min    GFR calc Af Amer >90  >90 mL/min   CBC WITH DIFFERENTIAL     Status: Abnormal   Collection Time   08/29/11  5:25 AM      Component Value Range Comment   WBC 7.2  4.0 - 10.5 K/uL    RBC 3.07 (*) 4.22 - 5.81 MIL/uL    Hemoglobin 8.8 (*) 13.0 - 17.0 g/dL    HCT 14.7 (*) 82.9 - 52.0 %    MCV 87.3  78.0 - 100.0 fL    MCH 28.7  26.0 - 34.0 pg    MCHC 32.8  30.0 - 36.0 g/dL    RDW 56.2 (*) 13.0 - 15.5 %    Platelets 306  150 - 400 K/uL    Neutrophils Relative 80 (*) 43 - 77 %    Lymphocytes Relative 9 (*) 12 - 46 %    Monocytes Relative 10  3 - 12 %    Eosinophils Relative 1  0 - 5 %    Basophils Relative 0  0 - 1 %    Neutro Abs 5.8  1.7 - 7.7 K/uL    Lymphs Abs 0.6 (*) 0.7 - 4.0 K/uL    Monocytes Absolute 0.7  0.1 - 1.0 K/uL    Eosinophils Absolute 0.1  0.0 - 0.7 K/uL    Basophils Absolute 0.0  0.0 - 0.1 K/uL    RBC Morphology ELLIPTOCYTES      WBC Morphology MILD LEFT SHIFT (1-5% METAS, OCC MYELO, OCC BANDS)   TOXIC GRANULATION   Smear Review GIANT PLATELETS SEEN     MAGNESIUM     Status: Normal   Collection Time   08/29/11  5:25 AM      Component Value Range Comment   Magnesium 2.1  1.5 - 2.5 mg/dL   HEMOGLOBIN Q6V     Status: Normal   Collection Time   08/29/11  5:25 AM      Component Value Range Comment   Hemoglobin A1C 5.2  <5.7 %    Mean Plasma Glucose 103  <117 mg/dL   LIPID PANEL     Status: Abnormal   Collection Time   08/29/11  5:25 AM      Component Value Range Comment   Cholesterol 61  0 - 200 mg/dL    Triglycerides 92  <784 mg/dL    HDL 14 (*) >69 mg/dL    Total CHOL/HDL Ratio 4.4      VLDL 18  0 - 40 mg/dL  LDL Cholesterol 29  0 - 99 mg/dL   CBC WITH DIFFERENTIAL     Status: Abnormal   Collection Time   08/30/11  4:30 AM      Component Value Range Comment   WBC 8.3  4.0 - 10.5 K/uL    RBC 3.37 (*) 4.22  - 5.81 MIL/uL    Hemoglobin 9.7 (*) 13.0 - 17.0 g/dL    HCT 40.9 (*) 81.1 - 52.0 %    MCV 87.8  78.0 - 100.0 fL    MCH 28.8  26.0 - 34.0 pg    MCHC 32.8  30.0 - 36.0 g/dL    RDW 91.4 (*) 78.2 - 15.5 %    Platelets 328  150 - 400 K/uL    Neutrophils Relative 82 (*) 43 - 77 %    Lymphocytes Relative 8 (*) 12 - 46 %    Monocytes Relative 9  3 - 12 %    Eosinophils Relative 1  0 - 5 %    Basophils Relative 0  0 - 1 %    Neutro Abs 6.8  1.7 - 7.7 K/uL    Lymphs Abs 0.7  0.7 - 4.0 K/uL    Monocytes Absolute 0.7  0.1 - 1.0 K/uL    Eosinophils Absolute 0.1  0.0 - 0.7 K/uL    Basophils Absolute 0.0  0.0 - 0.1 K/uL    RBC Morphology ELLIPTOCYTES   CRENATED RBCs   WBC Morphology MILD LEFT SHIFT (1-5% METAS, OCC MYELO, OCC BANDS)     BASIC METABOLIC PANEL     Status: Abnormal   Collection Time   08/30/11  4:30 AM      Component Value Range Comment   Sodium 131 (*) 135 - 145 mEq/L    Potassium 3.5  3.5 - 5.1 mEq/L    Chloride 103  96 - 112 mEq/L    CO2 21  19 - 32 mEq/L    Glucose, Bld 88  70 - 99 mg/dL    BUN 15  6 - 23 mg/dL    Creatinine, Ser 9.56  0.50 - 1.35 mg/dL    Calcium 7.3 (*) 8.4 - 10.5 mg/dL    GFR calc non Af Amer 87 (*) >90 mL/min    GFR calc Af Amer >90  >90 mL/min   BASIC METABOLIC PANEL     Status: Abnormal   Collection Time   08/31/11  4:50 AM      Component Value Range Comment   Sodium 133 (*) 135 - 145 mEq/L    Potassium 3.1 (*) 3.5 - 5.1 mEq/L    Chloride 105  96 - 112 mEq/L    CO2 21  19 - 32 mEq/L    Glucose, Bld 125 (*) 70 - 99 mg/dL    BUN 11  6 - 23 mg/dL    Creatinine, Ser 2.13  0.50 - 1.35 mg/dL    Calcium 7.2 (*) 8.4 - 10.5 mg/dL    GFR calc non Af Amer 89 (*) >90 mL/min    GFR calc Af Amer >90  >90 mL/min   CBC     Status: Abnormal   Collection Time   08/31/11  4:50 AM      Component Value Range Comment   WBC 9.2  4.0 - 10.5 K/uL    RBC 3.16 (*) 4.22 - 5.81 MIL/uL    Hemoglobin 9.1 (*) 13.0 - 17.0 g/dL    HCT 08.6 (*) 57.8 - 52.0 %  MCV 86.7   78.0 - 100.0 fL    MCH 28.8  26.0 - 34.0 pg    MCHC 33.2  30.0 - 36.0 g/dL    RDW 16.1 (*) 09.6 - 15.5 %    Platelets 276  150 - 400 K/uL   MAGNESIUM     Status: Normal   Collection Time   08/31/11  4:50 AM      Component Value Range Comment   Magnesium 1.9  1.5 - 2.5 mg/dL    Recent Results (from the past 240 hour(s))  URINE CULTURE     Status: Normal   Collection Time   08/26/11  1:46 AM      Component Value Range Status Comment   Specimen Description URINE, CATHETERIZED   Final    Special Requests NONE   Final    Culture  Setup Time 08/26/2011 09:52   Final    Colony Count >=100,000 COLONIES/ML   Final    Culture     Final    Value: ESCHERICHIA COLI     KLEBSIELLA OXYTOCA   Report Status 08/29/2011 FINAL   Final    Organism ID, Bacteria ESCHERICHIA COLI   Final    Organism ID, Bacteria KLEBSIELLA OXYTOCA   Final   CULTURE, BLOOD (ROUTINE X 2)     Status: Normal (Preliminary result)   Collection Time   08/26/11  3:10 AM      Component Value Range Status Comment   Specimen Description BLOOD LEFT HAND   Final    Special Requests BOTTLES DRAWN AEROBIC AND ANAEROBIC 3CC   Final    Culture  Setup Time 08/26/2011 08:56   Final    Culture     Final    Value:        BLOOD CULTURE RECEIVED NO GROWTH TO DATE CULTURE WILL BE HELD FOR 5 DAYS BEFORE ISSUING A FINAL NEGATIVE REPORT   Report Status PENDING   Incomplete   CULTURE, BLOOD (ROUTINE X 2)     Status: Normal (Preliminary result)   Collection Time   08/26/11  3:20 AM      Component Value Range Status Comment   Specimen Description BLOOD RIGHT ANTECUBITAL   Final    Special Requests BOTTLES DRAWN AEROBIC AND ANAEROBIC 6CC   Final    Culture  Setup Time 08/26/2011 08:56   Final    Culture     Final    Value:        BLOOD CULTURE RECEIVED NO GROWTH TO DATE CULTURE WILL BE HELD FOR 5 DAYS BEFORE ISSUING A FINAL NEGATIVE REPORT   Report Status PENDING   Incomplete   STOOL CULTURE     Status: Normal   Collection Time   08/26/11  7:30 AM        Component Value Range Status Comment   Specimen Description STOOL   Final    Special Requests Normal   Final    Culture     Final    Value: NO SALMONELLA, SHIGELLA, CAMPYLOBACTER, YERSINIA, OR E.COLI 0157:H7 ISOLATED   Report Status 08/30/2011 FINAL   Final   CLOSTRIDIUM DIFFICILE BY PCR     Status: Abnormal   Collection Time   08/26/11  7:30 AM      Component Value Range Status Comment   C difficile by pcr POSITIVE (*) NEGATIVE Final    Creatinine:  Basename 08/31/11 0450 08/30/11 0430 08/29/11 0525 08/28/11 0910 08/26/11 0801 08/25/11 2210  CREATININE 0.62 0.66 0.68 0.80  1.22 1.11    Xrays: See report/chart   Impression/Assessment:  Urine clear, in midst of hyperbaric treatments for hemorrhagic cystitis. Now with CVA. Marland KitchenAgree that short-acting anticoagulant would be best choice for [name], particularly if ASA can be stopped, and he will not need coumadin.   Plan:  Follow-up as outpatient. [phone]. Continue hyperbaric oxygen therapy.   [name] I 08/31/2011, 8:11 PM

## 2011-08-31 NOTE — Progress Notes (Signed)
Patient had 2.32 sec pause during shift change. Patient resting comfortably.  Will continue to monitor

## 2011-08-31 NOTE — Progress Notes (Deleted)
Patient receiving second unit PRBC.  Patient tolerating well. VSS. Patient having trigeminy on telemetry. Patient resting well. Will continue to monitor

## 2011-09-01 DIAGNOSIS — I6789 Other cerebrovascular disease: Secondary | ICD-10-CM

## 2011-09-01 LAB — CULTURE, BLOOD (ROUTINE X 2)
Culture: NO GROWTH
Culture: NO GROWTH

## 2011-09-01 LAB — BASIC METABOLIC PANEL
BUN: 10 mg/dL (ref 6–23)
Creatinine, Ser: 0.65 mg/dL (ref 0.50–1.35)
GFR calc Af Amer: >90 mL/min (ref >90)
GFR calc non Af Amer: 88 mL/min — ABNORMAL LOW (ref >90)
Potassium: 3.7 mEq/L (ref 3.5–5.1)

## 2011-09-01 MED ORDER — CEFUROXIME AXETIL 500 MG PO TABS
500.0000 mg | ORAL_TABLET | Freq: Two times a day (BID) | ORAL | Status: DC
  Administered 2011-09-01 – 2011-09-02 (×3): 500 mg via ORAL
  Filled 2011-09-01 (×5): qty 1

## 2011-09-01 NOTE — Progress Notes (Signed)
Subjective: 78 yo male who developed life threatening gross hematuria several years post EBRT for prostate cancer, requiring multiple cystoscopies, bladder evacuations/irrigations, catheters, and now continuing hyperbaric O2 treatments to try to stop his hematuria.  Currently hospitalized for CVA, embolic, A. Fib, and C. Diff diarrhea, and recent intra-abdominal abscess drained percutaneously.   Feels ok today.    Objective: Vital signs in last 24 hours: Temp:  [97.6 F (36.4 C)-98.4 F (36.9 C)] 98.4 F (36.9 C) (08/14 0622) Pulse Rate:  [53-88] 88  (08/14 0622) Resp:  [16-18] 18  (08/14 0622) BP: (98-156)/(63-82) 98/63 mmHg (08/14 0622) SpO2:  [97 %-99 %] 97 % (08/14 0622)  Intake/Output from previous day: 08/13 0701 - 08/14 0700 In: 2225 [P.O.:300; I.V.:1875; IV Piggyback:50] Out: 850 [Urine:850] Intake/Output this shift:    Physical Exam: Pt using restroom General:alert, cooperative and no distress Foley: Urine dark yellow with some mucus present   Lab Results:  Basename 08/31/11 0450 08/30/11 0430  HGB 9.1* 9.7*  HCT 27.4* 29.6*   BMET  Basename 09/01/11 0415 08/31/11 0450  NA 138 133*  K 3.7 3.1*  CL 111 105  CO2 20 21  GLUCOSE 109* 125*  BUN 10 11  CREATININE 0.65 0.62  CALCIUM 7.3* 7.2*   No results found for this basename: LABPT:3,INR:3 in the last 72 hours No results found for this basename: LABURIN:1 in the last 72 hours Results for orders placed during the hospital encounter of 08/25/11  URINE CULTURE     Status: Normal   Collection Time   08/26/11  1:46 AM      Component Value Range Status Comment   Specimen Description URINE, CATHETERIZED   Final    Special Requests NONE   Final    Culture  Setup Time 08/26/2011 09:52   Final    Colony Count >=100,000 COLONIES/ML   Final    Culture     Final    Value: ESCHERICHIA COLI     KLEBSIELLA OXYTOCA   Report Status 08/29/2011 FINAL   Final    Organism ID, Bacteria ESCHERICHIA COLI   Final    Organism ID, Bacteria KLEBSIELLA OXYTOCA   Final   CULTURE, BLOOD (ROUTINE X 2)     Status: Normal (Preliminary result)   Collection Time   08/26/11  3:10 AM      Component Value Range Status Comment   Specimen Description BLOOD LEFT HAND   Final    Special Requests BOTTLES DRAWN AEROBIC AND ANAEROBIC 3CC   Final    Culture  Setup Time 08/26/2011 08:56   Final    Culture     Final    Value:        BLOOD CULTURE RECEIVED NO GROWTH TO DATE CULTURE WILL BE HELD FOR 5 DAYS BEFORE ISSUING A FINAL NEGATIVE REPORT   Report Status PENDING   Incomplete   CULTURE, BLOOD (ROUTINE X 2)     Status: Normal (Preliminary result)   Collection Time   08/26/11  3:20 AM      Component Value Range Status Comment   Specimen Description BLOOD RIGHT ANTECUBITAL   Final    Special Requests BOTTLES DRAWN AEROBIC AND ANAEROBIC [facility]   Final    Culture  Setup Time 08/26/2011 08:56   Final    Culture     Final    Value:        BLOOD CULTURE RECEIVED NO GROWTH TO DATE CULTURE WILL BE HELD FOR 5 DAYS BEFORE ISSUING A FINAL  NEGATIVE REPORT   Report Status PENDING   Incomplete   STOOL CULTURE     Status: Normal   Collection Time   08/26/11  7:30 AM      Component Value Range Status Comment   Specimen Description STOOL   Final    Special Requests Normal   Final    Culture     Final    Value: NO SALMONELLA, SHIGELLA, CAMPYLOBACTER, YERSINIA, OR E.COLI 0157:H7 ISOLATED   Report Status 08/30/2011 FINAL   Final   CLOSTRIDIUM DIFFICILE BY PCR     Status: Abnormal   Collection Time   08/26/11  7:30 AM      Component Value Range Status Comment   C difficile by pcr POSITIVE (*) NEGATIVE Final     Studies/Results: No results found.  Assessment/Plan:  Urine remains clear  Continue tx of UTI  Continue hyperbaric therapy  Anticoag per IM  F/U in office 1-2 after discharge    LOS: 7 days   YARBROUGH,Denetta Fei G. 09/01/2011, 9:32 AM

## 2011-09-01 NOTE — Progress Notes (Signed)
[facility] PROGRESS NOTE  [name] ZOX:[id number] DOB: 12-05-1926 DOA: 08/25/2011 PCP: Oliver Barre, MD  Assessment/Plan: Principal Problem:  *CVA (cerebrovascular accident) Active Problems:  GERD  Hypertension  E-coli UTI  Atrial fibrillation  Protein-calorie malnutrition, severe  Intra-abdominal abscess  Hyponatremia  Hypothyroidism  Clostridium difficile diarrhea  Acute diarrhea  Leukocytosis  Severe malnutrition  Hypokalemia   #1 acute CVA Likely embolic in nature. Head CT negative. MRI consistent with nonhemorrhagic acute moderate sized infarct extending from the right sub-insula/right posterior patellar region into the right posterior frontal-parietal lobe. MRA of the head with occluded left internal carotid artery with collateral flow. Almost completely occluded right vertebral artery noted on prior exam. 2-D echo is pending. Fasting lipid panel with LDL of 29. Carotid Doppler is pending however preliminary with no right ICA stenosis. CEA appears open. Right vertebral artery appears occluded. Occlusion of left ICA. Patient failed bedside swallow evaluation. Speech therapy consultation is pending. PT OT. Will place on aspirin rectally. Patient does have a history of A. fib. Patient has prior history of CVAs. Patient was initially deemed not anticoagulating candidate secondary to his hemorrhagic cystitis. In light of patient's recent stroke neurology is recommending that patient likely needs anti-coagulation to start approximately one week post discharg.. I curb sided Dr. Berneice Heinrich of urology and he was felt that due to patient's recent stroke may consider one of the short acting Anticoagulant agents.  Continue aspirin. Short acting anticoagulant to be started one week post discharge per neurology recommendations. Lovenox has been discontinued. Neurology is following and appreciate their input and recommendations. Per neurology.  2C difficille Colitis/ diarrhea - Continue oral  vancomycin. Antibiotic D6/14. Continue  florastor. His diarrhea seems to be subsiding  - WBC trending down.  - Pt is afebrile   3. H/o intraabdominal abscess  - CT of abdomen performed: No drainable fluid collection or abscess.   4. Leukocytosis  - Related to # 2 and UTI and improving on oral vancomycin and Rocephin.  5. Hyponatremia  - Likely related to poor oral solute intake. Improved with hydration. - Check level tomorrow.   6. Severe Malnutrition  - At this point likely related to chronic illness. Should improve with resolution of active c difficil infection.  - Nutritional supplements on board.   7. Atrial fibrillation  - Amiodarone for rate control. ASA .  Stable will consider one of the short acting anticoagulant agents one week post discharge per neurology recommendations..  8. Hypothyroidism  - Continue synthroid  9. ECOLI/Klebsiella oxytoca UTI D/C IV Rocephin. Will change to oral antibiotic of ceftin today. Antibiotic D5/7-10.  10. Hx of hemmorrhagic cystitis Urine clear. Patient seen by urology and recommend continued hyperbaric treatments and outpatient follow up.     Code Status: FULL Family Communication: Updated patient. No family at bedside. Disposition Plan: Patient refused skilled nursing facility. When this medically stable will go home with home health.   Brief narrative: Pt is an 78 y/o with h/o hemorrhagic cystitis due to radiation therapy for prostate cancer and chronic indwelling catheter. Presented to the hospital secondary to weakness, decrease oral intake, and diarrhea. Has had recent history of C difficil infection and despite treatment not too long ago presents with another bout of c difficil associated diarrhea. Switched from flagyl to oral vancomycin and currently improving.  Patient also noted to have an acute stroke: 08/28/2011.     Consultants:  Neurology: Dr. Amada Jupiter 08/28/2011  Urology: Dr Patsi Sears  Procedures:  CT head  08/28/2011  MRI/MRA head 08/28/2011  2-D echo 08/29/2011  Carotid Dopplers 08/29/2011  Antibiotics:  IV Rocephin 08/28/11--->09/01/11  Oral vancomycin 08/26/11  ceftin 09/01/11  HPI/Subjective: Patient's dysarthria improved. Patient with decreased in number of stools. Patient states left upper extremity weakness slowly improving. Patient has been refusing to go to a skilled nursing facility and states he rather die than go back to a skilled nursing facility. Patient is adamant on going home. Patient states 1 large BM this morning.  Objective: Filed Vitals:   08/31/11 1039 08/31/11 1612 08/31/11 2300 09/01/11 0622  BP: 113/66 110/70 156/82 98/63  Pulse: 61 53 64 88  Temp: 97.8 F (36.6 C) 97.6 F (36.4 C) 97.8 F (36.6 C) 98.4 F (36.9 C)  TempSrc: Oral Oral Oral Oral  Resp: 16 16 16 18   Height:      Weight:      SpO2: 98% 99% 97% 97%    Intake/Output Summary (Last 24 hours) at 09/01/11 1037 Last data filed at 09/01/11 1009  Gross per 24 hour  Intake   2345 ml  Output    850 ml  Net   1495 ml   Filed Weights   08/26/11 0703  Weight: 65.635 kg (144 lb 11.2 oz)    Exam: General: Alert, awake, oriented x3, in no acute distress.Cachetic. Dysarthria improving. HEENT: No bruits, no goiter. Heart: Regular rate and rhythm, without murmurs, rubs, gallops. Lungs: Clear to auscultation bilaterally. Abdomen: Soft, nontender, nondistended, positive bowel sounds. Extremities: No clubbing cyanosis or edema with positive pedal pulses. Neuro: Dysarthria improved. LUE weakness. Left hemi neglect improved..  Data Reviewed: Basic Metabolic Panel:  Lab 09/01/11 4098 08/31/11 0450 08/30/11 0430 08/29/11 0525 08/28/11 0910 08/27/11 0500  NA 138 133* 131* 131* 130* --  K 3.7 3.1* 3.5 3.4* 3.9 --  CL 111 105 103 105 102 --  CO2 20 21 21 21 23  --  GLUCOSE 109* 125* 88 93 115* --  BUN 10 11 15 21  34* --  CREATININE 0.65 0.62 0.66 0.68 0.80 --  CALCIUM 7.3* 7.2* 7.3* 7.0* 7.4* --    MG -- 1.9 -- 2.1 -- 2.2  PHOS -- -- -- -- -- 3.6   Liver Function Tests:  Lab 08/26/11 0801 08/25/11 2210  AST 53* 60*  ALT 31 35  ALKPHOS 100 129*  BILITOT 0.3 0.5  PROT 5.3* 6.1  ALBUMIN 2.0* 2.3*    Lab 08/25/11 2210  LIPASE 9*  AMYLASE --   No results found for this basename: AMMONIA:5 in the last 168 hours CBC:  Lab 08/31/11 0450 08/30/11 0430 08/29/11 0525 08/28/11 0910 08/26/11 0801 08/25/11 2210  WBC 9.2 8.3 7.2 11.2* 28.3* --  NEUTROABS -- 6.8 5.8 -- -- 32.2*  HGB 9.1* 9.7* 8.8* 9.0* 10.8* --  HCT 27.4* 29.6* 26.8* 27.2* 32.0* --  MCV 86.7 87.8 87.3 87.5 86.7 --  PLT 276 328 306 332 362 --   Cardiac Enzymes: No results found for this basename: CKTOTAL:5,CKMB:5,CKMBINDEX:5,TROPONINI:5 in the last 168 hours BNP (last 3 results)  Basename 07/21/11 2335 04/29/11 0130 04/28/11 1002  PROBNP 1170.0* 11811.0* 10058.0*   CBG: No results found for this basename: GLUCAP:5 in the last 168 hours  Recent Results (from the past 240 hour(s))  URINE CULTURE     Status: Normal   Collection Time   08/26/11  1:46 AM      Component Value Range Status Comment   Specimen Description URINE, CATHETERIZED   Final    Special Requests NONE  Final    Culture  Setup Time 08/26/2011 09:52   Final    Colony Count >=100,000 COLONIES/ML   Final    Culture     Final    Value: ESCHERICHIA COLI     KLEBSIELLA OXYTOCA   Report Status 08/29/2011 FINAL   Final    Organism ID, Bacteria ESCHERICHIA COLI   Final    Organism ID, Bacteria KLEBSIELLA OXYTOCA   Final   CULTURE, BLOOD (ROUTINE X 2)     Status: Normal   Collection Time   08/26/11  3:10 AM      Component Value Range Status Comment   Specimen Description BLOOD LEFT HAND   Final    Special Requests BOTTLES DRAWN AEROBIC AND ANAEROBIC 3CC   Final    Culture  Setup Time 08/26/2011 08:56   Final    Culture NO GROWTH 5 DAYS   Final    Report Status 09/01/2011 FINAL   Final   CULTURE, BLOOD (ROUTINE X 2)     Status: Normal    Collection Time   08/26/11  3:20 AM      Component Value Range Status Comment   Specimen Description BLOOD RIGHT ANTECUBITAL   Final    Special Requests BOTTLES DRAWN AEROBIC AND ANAEROBIC [facility]   Final    Culture  Setup Time 08/26/2011 08:56   Final    Culture NO GROWTH 5 DAYS   Final    Report Status 09/01/2011 FINAL   Final   STOOL CULTURE     Status: Normal   Collection Time   08/26/11  7:30 AM      Component Value Range Status Comment   Specimen Description STOOL   Final    Special Requests Normal   Final    Culture     Final    Value: NO SALMONELLA, SHIGELLA, CAMPYLOBACTER, YERSINIA, OR E.COLI 0157:H7 ISOLATED   Report Status 08/30/2011 FINAL   Final   CLOSTRIDIUM DIFFICILE BY PCR     Status: Abnormal   Collection Time   08/26/11  7:30 AM      Component Value Range Status Comment   C difficile by pcr POSITIVE (*) NEGATIVE Final      Studies: Ct Abdomen Pelvis W Contrast  08/26/2011  *RADIOLOGY REPORT*  Clinical Data: C diff colitis, on flagyl, evaluate for recurrent abscess.  Weight loss, history of diverticulitis, prostate cancer status post XRT.  CT ABDOMEN AND PELVIS WITH CONTRAST  Technique:  Multidetector CT imaging of the abdomen and pelvis was performed following the standard protocol during bolus administration of intravenous contrast.  Contrast: OMNIPAQUE IOHEXOL 300 MG/ML  SOLN  Comparison: 06/22/2011  Findings: Stable partially calcified nodule at the right lung base (series 6/image 14).  Small hiatal hernia.  Liver, spleen, pancreas, and adrenal glands are within normal limits.  Gallbladder is notable for layering sludge and a small gallstone in the gallbladder fundus (series 2/image 28).  No associated inflammatory changes.  No intrahepatic or extrahepatic ductal dilatation.  Kidneys are within normal limits.  No hydronephrosis.  No evidence of bowel obstruction. Large duodenal diverticulum (series 2/image 36).  Diffuse colonic wall thickening with associated inflammatory  changes, compatible with reported history of pseudomembranous colitis.  No drainable fluid collection or abscess.  No free air clear.  Small volume abdominopelvic ascites.  Atherosclerotic calcifications of the abdominal aorta and branch vessels.  No suspicious abdominopelvic lymphadenopathy.  Concentric bladder wall thickening, correlate for cystitis (series 2/image 80). High density fluid  within the bladder may reflect hemorrhagic (series 2/image 81).  Indwelling Foley catheter with associated tiny foci of nondependent gas.  Status post prostatectomy.  Mild degenerative changes of the visualized thoracolumbar spine, most prominent at L5 S1.  IMPRESSION:  Diffuse colonic wall thickening with associated inflammatory changes, compatible with reported history of pseudomembranous colitis.  No drainable fluid collection or abscess.  No free air clear.  Thick-walled bladder, correlate for cystitis.  Suspected bladder hemorrhage.  Indwelling Foley catheter.  Additional ancillary findings as above.  Original Report Authenticated By: Charline Bills, M.D.   Dg Chest Port 1 View  08/26/2011  *RADIOLOGY REPORT*  Clinical Data: Weakness and elevated white cell count.  PORTABLE CHEST - 1 VIEW  Comparison: 07/21/2011  Findings: Stable appearance of postoperative changes in the mediastinum.  Normal heart size and pulmonary vascularity. Emphysematous changes and scattered fibrosis in the lungs.  No focal airspace consolidation.  No blunting of costophrenic angles. No pneumothorax.  Calcified and tortuous aorta.  Mediastinal contours appear intact.  Old right rib fractures.  No significant change since previous study.  IMPRESSION: Emphysema and fibrosis in the lungs.  No evidence of active pulmonary disease.  Original Report Authenticated By: Marlon Pel, M.D.    Scheduled Meds:    . acidophilus  1 capsule Oral TID WC  . amiodarone  200 mg Oral BID  . aspirin  325 mg Oral Daily  . atorvastatin  20 mg Oral Daily   . cefUROXime  500 mg Oral BID WC  . citalopram  10 mg Oral Daily  . famotidine  40 mg Oral Daily  . feeding supplement  237 mL Oral TID BM  . ferrous sulfate  325 mg Oral BID  . levothyroxine  25 mcg Oral QAC breakfast  . metoprolol succinate  25 mg Oral Daily  . potassium chloride SA  20 mEq Oral Daily  . potassium chloride  40 mEq Oral Q4H  . sodium chloride  3 mL Intravenous Q12H  . thiamine  100 mg Oral Daily  . vancomycin  125 mg Oral Q6H  . DISCONTD: cefTRIAXone (ROCEPHIN)  IV  1 g Intravenous Q24H   Continuous Infusions:    . dextrose 5 % and 0.9% NaCl 75 mL/hr at 09/01/11 0919    Principal Problem:  *CVA (cerebrovascular accident) Active Problems:  GERD  Hypertension  E-coli UTI  Atrial fibrillation  Protein-calorie malnutrition, severe  Intra-abdominal abscess  Hyponatremia  Hypothyroidism  Clostridium difficile diarrhea  Acute diarrhea  Leukocytosis  Severe malnutrition  Hypokalemia    Time spent: 22 MINS    [facility]  [facility] Pager [phone]. If 8PM-8AM, please contact night-coverage at [email or web address], password [facility] 09/01/2011, 10:37 AM  LOS: 7 days

## 2011-09-01 NOTE — Progress Notes (Signed)
Speech Language Pathology Dysphagia Treatment Patient Details Name: [name] MRN: [id number] DOB: March 09, 1926 Today's Date: 09/01/2011 Time: 4098-1191 SLP Time Calculation (min): 23 min  Assessment / Plan / Recommendation Clinical Impression  SLP education session with pt, spouse and pt's sister.  Reviewed results of bedside swallow evaluation, possible ramifications of aspiration but also ramifications of pt not consuming liquids (dehydration, etc).  Further provided list of compensatory strategies to maximize airway protection, possible diet modifications, reflux precautions *(pt with h/o of GER when he consumed beer per his statement), and clinical indicators of asp pna.  Advised family know how to complete heimlich manuever to use if indicated.  Spouse concurs pt with h/o "coughing on chocolate and some with drinks" again causing slp to suspect some baseline dysphagia and possible aspiration.  Pt's CXR is negative for pulmonary infection therefore suspect he has tolerated his dysphagia.   All education completed and spouse, sister and pt expressed gratitude for information/tips.  No follow up indicated.  Thanks.    Diet Recommendation  Initiate / Change Diet: Thin liquid;Dysphagia 3 (mechanical soft)    SLP Plan All goals met   Pertinent Vitals/Pain Afebrile, decreased    Swallowing Goals  SLP Swallowing Goals Patient will consume recommended diet without observed clinical signs of aspiration with:  (n/a-d/c'd due to dietary advancement) Swallow Study Goal #3 - Progress: Met Swallow Study Goal #4 - Progress: Met  General Temperature Spikes Noted: No Respiratory Status: Room air Behavior/Cognition: Alert;Cooperative;Pleasant mood Oral Cavity - Dentition: Dentures, bottom (not wearing top due to pain, encouraged to try again) Patient Positioning: Upright in bed   Dysphagia Treatment Treatment focused on: Patient/family/caregiver education Family/Caregiver Educated: spouse and  pt's sister Patient observed directly with PO's: No Reason PO's not observed: Other (comment) (education only, pt dislikes thickener, not needed to observe) Feeding: Able to feed self   GO     [name] Burnet, MS [facility] SLP [phone]

## 2011-09-01 NOTE — Progress Notes (Signed)
LATE ENTRY FROM 08/30/2012 Long discussion with patient about DCP; Attending MD and Kelly/ Social Worker present; Patient does not want to go to a SNF at this time, patient feels that if he goes to a SNF he will die; spouse voices difficulties with caring for the patient at home but the patient is determined to go home at discharge. Home Health Care options given, spouse chose [facility], Norberta Keens RN with [facility] called for arrangement; also gave spouse a private sitter list for additional support at home.

## 2011-09-01 NOTE — Progress Notes (Signed)
Physical Therapy Treatment Patient Details Name: [name] MRN: [id number] DOB: 1926-10-01 Today's Date: 09/01/2011 Time: 3086-5784 PT Time Calculation (min): 27 min  PT Assessment / Plan / Recommendation Comments on Treatment Session  Pt continues to state he is tired, however seems more alert during session.  Ambulated with RW, however pt would benefit more from 2 person HHA vs hand grip for LUE.     Follow Up Recommendations  Inpatient Rehab;Skilled nursing facility    Barriers to Discharge        Equipment Recommendations  Defer to next venue    Recommendations for Other Services    Frequency Min 4X/week   Plan Discharge plan remains appropriate    Precautions / Restrictions Precautions Precautions: Fall Precaution Comments: L sided weakness from CVA. Restrictions Weight Bearing Restrictions: No   Pertinent Vitals/Pain 0/10    Mobility  Bed Mobility Bed Mobility: Supine to Sit Supine to Sit: 4: Min assist;HOB elevated Details for Bed Mobility Assistance: Pt doing much better with bed mobiltiy, however requires cues for attending to task due to decreased attention.  Cues for safety and technique.  Transfers Transfers: Sit to Stand;Stand to Sit Sit to Stand: 4: Min assist;With upper extremity assist;From elevated surface;From bed Stand to Sit: 4: Min assist;With upper extremity assist;With armrests;To chair/3-in-1 Details for Transfer Assistance: Max cuing for safety and hand placement when sitting/standing.  Assist to stabalize.  Ambulation/Gait Ambulation/Gait Assistance: 1: +2 Total assist Ambulation/Gait: Patient Percentage: 40% Ambulation Distance (Feet): 25 Feet Assistive device: Rolling walker Ambulation/Gait Assistance Details: Used RW for ambulation, however pt with tendency for LUE to fall off grip.  Assist for weight shifting and LE advancement and constant cuing for upright posture.  Pt will most likely benefit from 2 person assist on both sides vs hand  grip for RW in order to stabalize LUE.   Gait Pattern: Step-through pattern;Decreased stride length;Decreased hip/knee flexion - left;Decreased weight shift to right;Decreased weight shift to left;Ataxic;Trunk flexed    Exercises     PT Diagnosis:    PT Problem List:   PT Treatment Interventions:     PT Goals Acute Rehab PT Goals PT Goal Formulation: With patient Time For Goal Achievement: 09/12/11 Potential to Achieve Goals: Fair Pt will go Supine/Side to Sit: with supervision PT Goal: Supine/Side to Sit - Progress: Progressing toward goal Pt will go Sit to Stand: with min assist PT Goal: Sit to Stand - Progress: Met Pt will go Stand to Sit: with supervision PT Goal: Stand to Sit - Progress: Progressing toward goal Pt will Ambulate: 16 - 50 feet;with mod assist;with least restrictive assistive device PT Goal: Ambulate - Progress: Progressing toward goal  Visit Information  Last PT Received On: 09/01/11 Assistance Needed: +2    Subjective Data  Subjective: I just want to lay down Patient Stated Goal: n/a   Cognition  Overall Cognitive Status: Impaired Area of Impairment: Attention;Following commands;Awareness of deficits;Safety/judgement Arousal/Alertness: Lethargic Orientation Level: Appears intact for tasks assessed Behavior During Session: Lethargic Current Attention Level: Sustained Attention - Other Comments: ~15sec Following Commands: Follows one step commands inconsistently Safety/Judgement: Impulsive;Decreased awareness of need for assistance;Decreased awareness of safety precautions    Balance     End of Session PT - End of Session Equipment Utilized During Treatment: Gait belt Activity Tolerance: Patient limited by fatigue Patient left: in chair;with call bell/phone within reach;with chair alarm set;with family/visitor present Nurse Communication: Mobility status   GP     Page, Meribeth Mattes 09/01/2011, 9:43 AM

## 2011-09-01 NOTE — Progress Notes (Signed)
Occupational Therapy Treatment Patient Details Name: [name] MRN: [id number] DOB: June 27, 1926 Today's Date: 09/01/2011 Time: 7829-5621 OT Time Calculation (min): 23 min  OT Assessment / Plan / Recommendation Comments on Treatment Session Pt alittle more awake than last session but still fatigues quickly with activity. Will benefit from L walker splint to help grip walker.     Follow Up Recommendations  Inpatient Rehab;Skilled nursing facility    Barriers to Discharge       Equipment Recommendations  Defer to next venue    Recommendations for Other Services    Frequency Min 3X/week   Plan Discharge plan needs to be updated    Precautions / Restrictions Precautions Precautions: Fall Precaution Comments: L side weakness from CVA Restrictions Weight Bearing Restrictions: No        ADL  Toilet Transfer: Simulated;+2 Total assistance Toilet Transfer: Patient Percentage: 40% Toilet Transfer Method: Other (comment) (ambulating and chair pulled up. see note below) ADL Comments: Pt sat at EOB for OT/PT to don gait belt with min assist for balance. Up with RW to ambulate in hallway and pt wanting to sit down so chair pulled up. Pt's L hand slips off RW due to decreased grip so assist needed to stabilize L hand on walker and also assist for weight shift, posture and balance when moving.     OT Diagnosis:    OT Problem List:   OT Treatment Interventions:     OT Goals ADL Goals ADL Goal: Toilet Transfer - Progress: Progressing toward goals ADL Goal: Additional Goal #1 - Progress: Progressing toward goals  Visit Information  Last OT Received On: 09/01/11 Assistance Needed: +2 PT/OT Co-Evaluation/Treatment: Yes    Subjective Data  Subjective: pt acknowledges OT greeting him Patient Stated Goal: none stated. agreeable to work with PT/OT   Prior Functioning       Cognition  Overall Cognitive Status: Impaired Area of Impairment: Attention;Following commands;Awareness of  deficits;Safety/judgement Arousal/Alertness: Lethargic (although improved from last visit. ) Orientation Level: Appears intact for tasks assessed Behavior During Session: Lethargic Current Attention Level: Sustained Attention - Other Comments: ~15sec Following Commands: Follows one step commands inconsistently Safety/Judgement: Decreased awareness of need for assistance;Decreased safety judgement for tasks assessed;Impulsive    Mobility Bed Mobility Bed Mobility: Supine to Sit Supine to Sit: 4: Min assist;HOB elevated Details for Bed Mobility Assistance: Pt doing much better with bed mobiltiy, however requires cues for attending to task due to decreased attention.  Cues for safety and technique.  Transfers Sit to Stand: 4: Min assist;With upper extremity assist;From elevated surface;From bed Stand to Sit: 4: Min assist;With upper extremity assist;With armrests;To chair/3-in-1 Details for Transfer Assistance: Max cuing for safety and hand placement when sitting/standing.  Assist to stabalize.    Exercises    Balance Balance Balance Assessed: Yes Static Sitting Balance Static Sitting - Balance Support: Bilateral upper extremity supported Static Sitting - Level of Assistance: 4: Min assist  End of Session OT - End of Session Equipment Utilized During Treatment: Gait belt Activity Tolerance: Patient limited by fatigue Patient left: in chair;with call bell/phone within reach;with chair alarm set  GO     [name] [phone] 09/01/2011, 10:29 AM

## 2011-09-02 ENCOUNTER — Encounter (HOSPITAL_COMMUNITY): Payer: Self-pay | Admitting: Emergency Medicine

## 2011-09-02 ENCOUNTER — Emergency Department (HOSPITAL_COMMUNITY)
Admission: EM | Admit: 2011-09-02 | Discharge: 2011-09-02 | Disposition: A | Discharge status: Home / Self Care | Payer: Medicare Other | Attending: Emergency Medicine | Admitting: Emergency Medicine

## 2011-09-02 DIAGNOSIS — I739 Peripheral vascular disease, unspecified: Secondary | ICD-10-CM | POA: Insufficient documentation

## 2011-09-02 DIAGNOSIS — I2510 Atherosclerotic heart disease of native coronary artery without angina pectoris: Secondary | ICD-10-CM | POA: Insufficient documentation

## 2011-09-02 DIAGNOSIS — R5381 Other malaise: Secondary | ICD-10-CM | POA: Insufficient documentation

## 2011-09-02 DIAGNOSIS — Z8673 Personal history of transient ischemic attack (TIA), and cerebral infarction without residual deficits: Secondary | ICD-10-CM | POA: Insufficient documentation

## 2011-09-02 DIAGNOSIS — Z87891 Personal history of nicotine dependence: Secondary | ICD-10-CM | POA: Insufficient documentation

## 2011-09-02 DIAGNOSIS — K5730 Diverticulosis of large intestine without perforation or abscess without bleeding: Secondary | ICD-10-CM | POA: Insufficient documentation

## 2011-09-02 DIAGNOSIS — M899 Disorder of bone, unspecified: Secondary | ICD-10-CM | POA: Insufficient documentation

## 2011-09-02 DIAGNOSIS — K219 Gastro-esophageal reflux disease without esophagitis: Secondary | ICD-10-CM | POA: Insufficient documentation

## 2011-09-02 DIAGNOSIS — R531 Weakness: Secondary | ICD-10-CM

## 2011-09-02 DIAGNOSIS — Z8489 Family history of other specified conditions: Secondary | ICD-10-CM | POA: Insufficient documentation

## 2011-09-02 DIAGNOSIS — R319 Hematuria, unspecified: Secondary | ICD-10-CM | POA: Insufficient documentation

## 2011-09-02 DIAGNOSIS — I10 Essential (primary) hypertension: Secondary | ICD-10-CM | POA: Insufficient documentation

## 2011-09-02 DIAGNOSIS — Z954 Presence of other heart-valve replacement: Secondary | ICD-10-CM | POA: Insufficient documentation

## 2011-09-02 DIAGNOSIS — I359 Nonrheumatic aortic valve disorder, unspecified: Secondary | ICD-10-CM | POA: Insufficient documentation

## 2011-09-02 DIAGNOSIS — Z800 Family history of malignant neoplasm of digestive organs: Secondary | ICD-10-CM | POA: Insufficient documentation

## 2011-09-02 DIAGNOSIS — Z833 Family history of diabetes mellitus: Secondary | ICD-10-CM | POA: Insufficient documentation

## 2011-09-02 DIAGNOSIS — M949 Disorder of cartilage, unspecified: Secondary | ICD-10-CM | POA: Insufficient documentation

## 2011-09-02 DIAGNOSIS — Z8249 Family history of ischemic heart disease and other diseases of the circulatory system: Secondary | ICD-10-CM | POA: Insufficient documentation

## 2011-09-02 DIAGNOSIS — Z823 Family history of stroke: Secondary | ICD-10-CM | POA: Insufficient documentation

## 2011-09-02 DIAGNOSIS — Z8546 Personal history of malignant neoplasm of prostate: Secondary | ICD-10-CM | POA: Insufficient documentation

## 2011-09-02 DIAGNOSIS — Z888 Allergy status to other drugs, medicaments and biological substances status: Secondary | ICD-10-CM | POA: Insufficient documentation

## 2011-09-02 DIAGNOSIS — K449 Diaphragmatic hernia without obstruction or gangrene: Secondary | ICD-10-CM | POA: Insufficient documentation

## 2011-09-02 DIAGNOSIS — K279 Peptic ulcer, site unspecified, unspecified as acute or chronic, without hemorrhage or perforation: Secondary | ICD-10-CM | POA: Insufficient documentation

## 2011-09-02 LAB — POCT I-STAT, CHEM 8
BUN: 10 mg/dL (ref 6–23)
Calcium, Ion: 1.21 mmol/L (ref 1.13–1.30)
Chloride: 110 mEq/L (ref 96–112)
Glucose, Bld: 100 mg/dL — ABNORMAL HIGH (ref 70–99)
TCO2: 19 mmol/L (ref 0–100)

## 2011-09-02 LAB — BASIC METABOLIC PANEL
CO2: 19 mEq/L (ref 19–32)
Calcium: 7.4 mg/dL — ABNORMAL LOW (ref 8.4–10.5)
Chloride: 108 mEq/L (ref 96–112)
Creatinine, Ser: 0.65 mg/dL (ref 0.50–1.35)
Glucose, Bld: 100 mg/dL — ABNORMAL HIGH (ref 70–99)
Sodium: 135 mEq/L (ref 135–145)

## 2011-09-02 LAB — CBC
HCT: 33.6 % — ABNORMAL LOW (ref 39.0–52.0)
MCH: 29 pg (ref 26.0–34.0)
MCV: 88.7 fL (ref 78.0–100.0)
RBC: 3.79 MIL/uL — ABNORMAL LOW (ref 4.22–5.81)
WBC: 14.6 10*3/uL — ABNORMAL HIGH (ref 4.0–10.5)

## 2011-09-02 MED ORDER — ALBUTEROL SULFATE (5 MG/ML) 0.5% IN NEBU
INHALATION_SOLUTION | RESPIRATORY_TRACT | Status: AC
  Filled 2011-09-02: qty 1

## 2011-09-02 MED ORDER — VANCOMYCIN 50 MG/ML ORAL SOLUTION: 125.0000 mg | Freq: Four times a day (QID) | ORAL | Status: DC

## 2011-09-02 MED ORDER — CEFUROXIME AXETIL 500 MG PO TABS: 500.0000 mg | ORAL_TABLET | Freq: Two times a day (BID) | ORAL | Status: DC

## 2011-09-02 MED ORDER — FAMOTIDINE 40 MG PO TABS: 40.0000 mg | ORAL_TABLET | Freq: Every day | ORAL | Status: DC

## 2011-09-02 NOTE — ED Notes (Signed)
Social worker at bedside speaking with family  

## 2011-09-02 NOTE — ED Provider Notes (Signed)
History     CSN: [id number]  Arrival date & time 09/02/11  1701   First MD Initiated Contact with Patient 09/02/11 1718      Chief Complaint  Patient presents with  . Pulled Foley cath out     (Consider location/radiation/quality/duration/timing/severity/associated sxs/prior treatment) HPI Patient pulled Foley out when he stumbled walking to the bathroom earlier today. He complains of generalized weakness.. no other complaint.. Wife reports that the patient was supposed to go home with wheelchair, walker and bedside commode. Which is not at home when he arrived. He was discharged from hospital today for recent stroke. No other treatment prior to coming here. Past Medical History  Diagnosis Date  . ADENOCARCINOMA, PROSTATE   . ALLERGIC RHINITIS   . B12 DEFICIENCY   . BENIGN PROSTATIC HYPERTROPHY   . CAROTID ARTERY DISEASE   . CEREBROVASCULAR ACCIDENT, HX OF   . CORONARY ARTERY DISEASE   . DIVERTICULOSIS, COLON   . GERD   . HEMORRHOIDS   . HIATAL HERNIA   . HYPERLIPIDEMIA   . HYPERTENSION   . OSTEOPENIA   . PEPTIC ULCER DISEASE   . PERIPHERAL VASCULAR DISEASE   . Personal history of alcoholism   . PROSTATE CANCER, HX OF   . PUD, HX OF   . TRANSIENT ISCHEMIC ATTACK   . Shingles   . Anemia   . AS (aortic stenosis)   . S/P AVR (aortic valve replacement) 09/23/2010  . Complication of anesthesia 09/23/2010    very confused after waking up. Stopped drinking 40 days before this surgery.  . Transfusion reaction, chill fever type 04/28/2011    Fever, rigors, tachycardia,tachypnea but no evidence of hemolysis  . Hemorrhagic cystitis 04/28/2011    Due to previous radiation for PSA recurrence of prostate cancer  . Blood transfusion   . Gross hematuria   . Stroke   . CVA (cerebrovascular accident) 08/29/2011    08/28/11    Past Surgical History  Procedure Date  . Popliteal synovial cyst excision   . Right carotid endarterectomy 1999    Left carotid total chronic occlusion  .  Prostate surgery   . Cardic stent 2004  . Aortic valve replacement 09/23/2010    #79mm [facility] Ease pericardial tissue valve  . Coronary artery bypass graft 09/23/2010    CABG x1 using LIMA to LAD  . Cystoscopy with biopsy 02/15/2011    Procedure: CYSTOSCOPY WITH BIOPSY;  Surgeon: Anner Crete, MD;  Location: [facility];  Service: Urology;  Laterality: N/A;  Cystoscopy, biopsy and fulguration of the bladder    Family History  Problem Relation Age of Onset  . Colon cancer Mother 40  . Diabetes Sister   . Hypertension Sister   . Coronary artery disease Sister   . Multiple sclerosis Sister   . Stroke Brother     History  Substance Use Topics  . Smoking status: Former Smoker    Quit date: 02/14/1958  . Smokeless tobacco: Never Used  . Alcohol Use: No     last drink 3 days ago, he has not been drinking as much      Review of Systems  Genitourinary: Positive for dysuria and hematuria.       Chronic hematuria  Neurological: Positive for weakness.       Generalized weakness  All other systems reviewed and are negative.    Allergies  Tolterodine tartrate  Home Medications   Current Outpatient Rx  Name Route Sig Dispense Refill  . AMIODARONE  HCL 200 MG PO TABS Oral Take 200 mg by mouth 2 (two) times daily.    . ASPIRIN 81 MG PO TABS Oral Take 81 mg by mouth daily.    . ATORVASTATIN CALCIUM 20 MG PO TABS Oral Take 20 mg by mouth daily.    Marland Kitchen CEFUROXIME AXETIL 500 MG PO TABS Oral Take 500 mg by mouth 2 (two) times daily with a meal. Take for 5 days then stop.    Marland Kitchen CITALOPRAM HYDROBROMIDE 10 MG PO TABS Oral Take 10 mg by mouth daily.    Marland Kitchen FAMOTIDINE 40 MG PO TABS Oral Take 40 mg by mouth daily.    Marland Kitchen ENSURE COMPLETE PO LIQD Oral Take 237 mLs by mouth 3 (three) times daily between meals.    Di Kindle SULFATE 325 (65 FE) MG PO TABS Oral Take 325 mg by mouth 2 (two) times daily.    . ACIDOPHILUS PROBIOTIC 100 MG PO CAPS Oral Take 1 capsule by mouth 3 (three) times daily with  meals.    Marland Kitchen LEVOTHYROXINE SODIUM 25 MCG PO TABS Oral Take 25 mcg by mouth daily before breakfast.    . METOPROLOL SUCCINATE ER 25 MG PO TB24 Oral Take 25 mg by mouth daily.    . ADULT MULTIVITAMIN W/MINERALS CH Oral Take 1 tablet by mouth daily.    Marland Kitchen POTASSIUM CHLORIDE CRYS ER 20 MEQ PO TBCR Oral Take 20 mEq by mouth daily.    . THIAMINE HCL 100 MG PO TABS Oral Take 100 mg by mouth daily.    Marland Kitchen VANCOMYCIN 50 MG/ML ORAL SOLUTION Oral Take 125 mg by mouth every 6 (six) hours. Take for 8 days then stop.    Marland Kitchen SWEEN EX CREA Topical Apply 1 application topically daily.      BP 102/55  Pulse 56  Temp 97.8 F (36.6 C) (Oral)  SpO2 100%  Physical Exam  Nursing note and vitals reviewed. Constitutional: He appears well-developed and well-nourished. No distress.       Chronically ill appearing, cachectic  HENT:  Head: Normocephalic and atraumatic.  Eyes: Conjunctivae are normal. Pupils are equal, round, and reactive to light.  Neck: Neck supple. No tracheal deviation present. No thyromegaly present.  Cardiovascular: Normal rate and regular rhythm.   No murmur heard. Pulmonary/Chest: Effort normal and breath sounds normal.  Abdominal: Soft. Bowel sounds are normal. He exhibits no distension. There is no tenderness.  Musculoskeletal: Normal range of motion. He exhibits no edema and no tenderness.  Neurological: He is alert. Coordination normal.       Walks with minimal assistance  Skin: Skin is warm and dry. No rash noted.  Psychiatric: He has a normal mood and affect.   Results for orders placed during the hospital encounter of 09/02/11  CBC      Component Value Range   WBC 14.6 (*) 4.0 - 10.5 K/uL   RBC 3.79 (*) 4.22 - 5.81 MIL/uL   Hemoglobin 11.0 (*) 13.0 - 17.0 g/dL   HCT 16.1 (*) 09.6 - 04.5 %   MCV 88.7  78.0 - 100.0 fL   MCH 29.0  26.0 - 34.0 pg   MCHC 32.7  30.0 - 36.0 g/dL   RDW 40.9 (*) 81.1 - 91.4 %   Platelets 358  150 - 400 K/uL  POCT I-STAT, CHEM 8      Component Value  Range   Sodium 139  135 - 145 mEq/L   Potassium 3.8  3.5 - 5.1 mEq/L   Chloride  110  96 - 112 mEq/L   BUN 10  6 - 23 mg/dL   Creatinine, Ser 1.61  0.50 - 1.35 mg/dL   Glucose, Bld 096 (*) 70 - 99 mg/dL   Calcium, Ion 0.45  4.09 - 1.30 mmol/L   TCO2 19  0 - 100 mmol/L   Hemoglobin 11.6 (*) 13.0 - 17.0 g/dL   HCT 81.1 (*) 91.4 - 78.2 %   Dg Chest 2 View  08/29/2011  *RADIOLOGY REPORT*  Clinical Data: Stroke, so we  CHEST - 2 VIEW  Comparison: This chest radiograph 82,013  Findings: Sternotomy wires overlie normal heart silhouette . There is mild scarring at the lung base unchanged from prior.  Lungs hyperinflated.  No effusion, infiltrate, or pneumothorax.  IMPRESSION: No significant change. Emphysema and basilar scarring.  Original Report Authenticated By: Genevive Bi, M.D.   Ct Head Wo Contrast  08/28/2011  *RADIOLOGY REPORT*  Clinical Data: Weakness.  History of prostate cancer.  Slurred speech.  CT HEAD WITHOUT CONTRAST  Technique:  Contiguous axial images were obtained from the base of the skull through the vertex without contrast.  Comparison: 04/29/2011.  Findings: No intracranial hemorrhage.  Global atrophy.  Remote posterior left opercular and right frontal lobe infarct. Prominent small vessel disease type changes.  No CT evidence of large acute infarct.  No intracranial mass lesion detected on this unenhanced exam.  Vascular calcifications.  Opacification mastoid air cells more notable on the right.  IMPRESSION:  No intracranial hemorrhage.  Global atrophy.  Remote posterior left opercular and right frontal lobe infarct.  Prominent small vessel disease type changes.  No CT evidence of large acute infarct.  Opacification mastoid air cells more notable on the right.  Original Report Authenticated By: Fuller Canada, M.D.   Mr [facility] Wo Contrast  08/28/2011  *RADIOLOGY REPORT*  Clinical Data:  Slurred speech.  Atrial fibrillation.  High blood pressure.  MRI BRAIN WITHOUT CONTRAST MRA  HEAD WITHOUT CONTRAST  Technique: Multiplanar, multiecho pulse sequences of the brain and surrounding structures were obtained according to standard protocol without intravenous contrast.  Angiographic images of the head were obtained using MRA technique without contrast.  Comparison: 08/28/2011 CT.  04/21/2009 MR.  MRI HEAD  Findings:  Non hemorrhagic acute moderate size infarct extends from the right sub insular/right posterior opercular region into the right posterior frontal - parietal lobe.  Remote infarct posterior left sub insular region.  Moderate small vessel disease type changes.  Global atrophy without hydrocephalus.  No intracranial hemorrhage.  1 cm right-sided pituitary mass without significant change since prior exam.  This is poorly assessed on the present examination.  Abnormal appearance of the right vertebral artery and left internal carotid artery as discussed below.  Opacification mastoid air cells and middle ear cavities bilaterally.  No obstructing lesions seen in the posterior-superior nasopharynx.  IMPRESSION:  Non hemorrhagic acute moderate size infarct extends from the right sub insular/right posterior opercular region into the right posterior frontal - parietal lobe.  Remote infarct posterior left sub insular region.  Moderate small vessel disease type changes.  Global atrophy without hydrocephalus.  1 cm right-sided pituitary mass without significant change since prior exam.  This is poorly assessed on the present examination.  Abnormal appearance of the right vertebral artery and left internal carotid artery as discussed below.  Opacification mastoid air cells and middle ear cavities bilaterally.  No obstructing lesions seen in the posterior-superior nasopharynx.  MRA HEAD  Findings: Occluded left internal carotid  artery with collateral flow with visualization of the left carotid terminus, left middle cerebral artery and left anterior cerebral artery.  Ectatic pre cavernous segment  right internal carotid artery.  Mild narrowing cavernous segment right internal carotid artery.  Mild ectasia cavernous and supraclinoid segment of the right internal carotid artery.  Mild irregularity and narrowing of the A1 segment of the left anterior cerebral artery.  Bulbous appearance of the anterior communicating artery.  Tiny aneurysm not excluded.  Stability can be confirmed on follow-up.  Middle cerebral artery moderate branch vessel irregularity.  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  No significant stenosis distal left vertebral artery.  Both PICAs visualized with mild irregularity.  No high-grade stenosis of the basilar artery.  Poor delineation of the AICAs.  Mild irregularity superior cerebellar artery bilaterally.  Posterior cerebral artery branch vessel irregularity bilaterally.  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  Bulbous appearance of the anterior communicating artery.  Tiny aneurysm not excluded.  Stability can be confirmed on follow-up.  Middle cerebral artery moderate branch vessel irregularity.  Poor delineation of the AICAs.  Branch vessel irregularity is noted above.  Critical Value/emergent results were called by telephone at the time of interpretation on 08/28/2011 at 9:55 p.m. to [facility] patients nurse., who verbally acknowledged these results.  Original Report Authenticated By: Fuller Canada, M.D.   Mr Brain Wo Contrast  08/28/2011  *RADIOLOGY REPORT*  Clinical Data:  Slurred speech.  Atrial fibrillation.  High blood pressure.  MRI BRAIN WITHOUT CONTRAST MRA HEAD WITHOUT CONTRAST  Technique: Multiplanar, multiecho pulse sequences of the brain and  surrounding structures were obtained according to standard protocol without intravenous contrast.  Angiographic images of the head were obtained using MRA technique without contrast.  Comparison: 08/28/2011 CT.  04/21/2009 MR.  MRI HEAD  Findings:  Non hemorrhagic acute moderate size infarct extends from the right sub insular/right posterior opercular region into the right posterior frontal - parietal lobe.  Remote infarct posterior left sub insular region.  Moderate small vessel disease type changes.  Global atrophy without hydrocephalus.  No intracranial hemorrhage.  1 cm right-sided pituitary mass without significant change since prior exam.  This is poorly assessed on the present examination.  Abnormal appearance of the right vertebral artery and left internal carotid artery as discussed below.  Opacification mastoid air cells and middle ear cavities bilaterally.  No obstructing lesions seen in the posterior-superior nasopharynx.  IMPRESSION:  Non hemorrhagic acute moderate size infarct extends from the right sub insular/right posterior opercular region into the right posterior frontal - parietal lobe.  Remote infarct posterior left sub insular region.  Moderate small vessel disease type changes.  Global atrophy without hydrocephalus.  1 cm right-sided pituitary mass without significant change since prior exam.  This is poorly assessed on the present examination.  Abnormal appearance of the right vertebral artery and left internal carotid artery as discussed below.  Opacification mastoid air cells and middle ear cavities bilaterally.  No obstructing lesions seen in the posterior-superior nasopharynx.  MRA HEAD  Findings: Occluded left internal carotid artery with collateral flow with visualization of the left carotid terminus, left middle cerebral artery and left anterior cerebral artery.  Ectatic pre cavernous segment right internal carotid  artery.  Mild narrowing cavernous segment right internal carotid  artery.  Mild ectasia cavernous and supraclinoid segment of the right internal carotid artery.  Mild irregularity and narrowing of the A1 segment of the left anterior cerebral artery.  Bulbous appearance of the anterior communicating artery.  Tiny aneurysm not excluded.  Stability can be confirmed on follow-up.  Middle cerebral artery moderate branch vessel irregularity.  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  No significant stenosis distal left vertebral artery.  Both PICAs visualized with mild irregularity.  No high-grade stenosis of the basilar artery.  Poor delineation of the AICAs.  Mild irregularity superior cerebellar artery bilaterally.  Posterior cerebral artery branch vessel irregularity bilaterally.  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  Bulbous appearance of the anterior communicating artery.  Tiny aneurysm not excluded.  Stability can be confirmed on follow-up.  Middle cerebral artery moderate branch vessel irregularity.  Poor delineation of the AICAs.  Branch vessel irregularity is noted above.  Critical Value/emergent results were called by telephone at the time of interpretation on 08/28/2011 at 9:55 p.m. to [facility] patients nurse., who verbally acknowledged these results.  Original Report Authenticated By: Fuller Canada, M.D.   Ct Abdomen Pelvis W Contrast  08/26/2011  *RADIOLOGY REPORT*  Clinical Data: C diff colitis, on flagyl, evaluate for recurrent abscess.  Weight loss, history of diverticulitis, prostate cancer status post XRT.  CT ABDOMEN AND PELVIS WITH CONTRAST  Technique:  Multidetector CT imaging of the abdomen and pelvis was performed following the standard  protocol during bolus administration of intravenous contrast.  Contrast: OMNIPAQUE IOHEXOL 300 MG/ML  SOLN  Comparison: 06/22/2011  Findings: Stable partially calcified nodule at the right lung base (series 6/image 14).  Small hiatal hernia.  Liver, spleen, pancreas, and adrenal glands are within normal limits.  Gallbladder is notable for layering sludge and a small gallstone in the gallbladder fundus (series 2/image 28).  No associated inflammatory changes.  No intrahepatic or extrahepatic ductal dilatation.  Kidneys are within normal limits.  No hydronephrosis.  No evidence of bowel obstruction. Large duodenal diverticulum (series 2/image 36).  Diffuse colonic wall thickening with associated inflammatory changes, compatible with reported history of pseudomembranous colitis.  No drainable fluid collection or abscess.  No free air clear.  Small volume abdominopelvic ascites.  Atherosclerotic calcifications of the abdominal aorta and branch vessels.  No suspicious abdominopelvic lymphadenopathy.  Concentric bladder wall thickening, correlate for cystitis (series 2/image 80). High density fluid within the bladder may reflect hemorrhagic (series 2/image 81).  Indwelling Foley catheter with associated tiny foci of nondependent gas.  Status post prostatectomy.  Mild degenerative changes of the visualized thoracolumbar spine, most prominent at L5 S1.  IMPRESSION:  Diffuse colonic wall thickening with associated inflammatory changes, compatible with reported history of pseudomembranous colitis.  No drainable fluid collection or abscess.  No free air clear.  Thick-walled bladder, correlate for cystitis.  Suspected bladder hemorrhage.  Indwelling Foley catheter.  Additional ancillary findings as above.  Original Report Authenticated By: Charline Bills, M.D.   Dg Chest Port 1 View  08/26/2011  *RADIOLOGY REPORT*  Clinical Data: Weakness and elevated white cell count.  PORTABLE CHEST - 1 VIEW  Comparison: 07/21/2011   Findings: Stable appearance  of postoperative changes in the mediastinum.  Normal heart size and pulmonary vascularity. Emphysematous changes and scattered fibrosis in the lungs.  No focal airspace consolidation.  No blunting of costophrenic angles. No pneumothorax.  Calcified and tortuous aorta.  Mediastinal contours appear intact.  Old right rib fractures.  No significant change since previous study.  IMPRESSION: Emphysema and fibrosis in the lungs.  No evidence of active pulmonary disease.  Original Report Authenticated By: Marlon Pel, M.D.    ED Course  Procedures (including critical care time) Spoke with case manager. Bedside commode and wheelchair range for. Spoke with Dr. Brunilda Payor who suggested to leave foleycatheter out  Labs Reviewed  CBC   No results found.   No diagnosis found.  Results for orders placed during the hospital encounter of 09/02/11  CBC      Component Value Range   WBC 14.6 (*) 4.0 - 10.5 K/uL   RBC 3.79 (*) 4.22 - 5.81 MIL/uL   Hemoglobin 11.0 (*) 13.0 - 17.0 g/dL   HCT 16.1 (*) 09.6 - 04.5 %   MCV 88.7  78.0 - 100.0 fL   MCH 29.0  26.0 - 34.0 pg   MCHC 32.7  30.0 - 36.0 g/dL   RDW 40.9 (*) 81.1 - 91.4 %   Platelets 358  150 - 400 K/uL  POCT I-STAT, CHEM 8      Component Value Range   Sodium 139  135 - 145 mEq/L   Potassium 3.8  3.5 - 5.1 mEq/L   Chloride 110  96 - 112 mEq/L   BUN 10  6 - 23 mg/dL   Creatinine, Ser 7.82  0.50 - 1.35 mg/dL   Glucose, Bld 956 (*) 70 - 99 mg/dL   Calcium, Ion 2.13  0.86 - 1.30 mmol/L   TCO2 19  0 - 100 mmol/L   Hemoglobin 11.6 (*) 13.0 - 17.0 g/dL   HCT 57.8 (*) 46.9 - 62.9 %   Dg Chest 2 View  08/29/2011  *RADIOLOGY REPORT*  Clinical Data: Stroke, so we  CHEST - 2 VIEW  Comparison: This chest radiograph 82,013  Findings: Sternotomy wires overlie normal heart silhouette . There is mild scarring at the lung base unchanged from prior.  Lungs hyperinflated.  No effusion, infiltrate, or pneumothorax.  IMPRESSION: No  significant change. Emphysema and basilar scarring.  Original Report Authenticated By: Genevive Bi, M.D.   Ct Head Wo Contrast  08/28/2011  *RADIOLOGY REPORT*  Clinical Data: Weakness.  History of prostate cancer.  Slurred speech.  CT HEAD WITHOUT CONTRAST  Technique:  Contiguous axial images were obtained from the base of the skull through the vertex without contrast.  Comparison: 04/29/2011.  Findings: No intracranial hemorrhage.  Global atrophy.  Remote posterior left opercular and right frontal lobe infarct. Prominent small vessel disease type changes.  No CT evidence of large acute infarct.  No intracranial mass lesion detected on this unenhanced exam.  Vascular calcifications.  Opacification mastoid air cells more notable on the right.  IMPRESSION:  No intracranial hemorrhage.  Global atrophy.  Remote posterior left opercular and right frontal lobe infarct.  Prominent small vessel disease type changes.  No CT evidence of large acute infarct.  Opacification mastoid air cells more notable on the right.  Original Report Authenticated By: Fuller Canada, M.D.   Mr [facility] Wo Contrast  08/28/2011  *RADIOLOGY REPORT*  Clinical Data:  Slurred speech.  Atrial fibrillation.  High blood pressure.  MRI BRAIN WITHOUT CONTRAST MRA HEAD WITHOUT  CONTRAST  Technique: Multiplanar, multiecho pulse sequences of the brain and surrounding structures were obtained according to standard protocol without intravenous contrast.  Angiographic images of the head were obtained using MRA technique without contrast.  Comparison: 08/28/2011 CT.  04/21/2009 MR.  MRI HEAD  Findings:  Non hemorrhagic acute moderate size infarct extends from the right sub insular/right posterior opercular region into the right posterior frontal - parietal lobe.  Remote infarct posterior left sub insular region.  Moderate small vessel disease type changes.  Global atrophy without hydrocephalus.  No intracranial hemorrhage.  1 cm right-sided pituitary  mass without significant change since prior exam.  This is poorly assessed on the present examination.  Abnormal appearance of the right vertebral artery and left internal carotid artery as discussed below.  Opacification mastoid air cells and middle ear cavities bilaterally.  No obstructing lesions seen in the posterior-superior nasopharynx.  IMPRESSION:  Non hemorrhagic acute moderate size infarct extends from the right sub insular/right posterior opercular region into the right posterior frontal - parietal lobe.  Remote infarct posterior left sub insular region.  Moderate small vessel disease type changes.  Global atrophy without hydrocephalus.  1 cm right-sided pituitary mass without significant change since prior exam.  This is poorly assessed on the present examination.  Abnormal appearance of the right vertebral artery and left internal carotid artery as discussed below.  Opacification mastoid air cells and middle ear cavities bilaterally.  No obstructing lesions seen in the posterior-superior nasopharynx.  MRA HEAD  Findings: Occluded left internal carotid artery with collateral flow with visualization of the left carotid terminus, left middle cerebral artery and left anterior cerebral artery.  Ectatic pre cavernous segment right internal carotid artery.  Mild narrowing cavernous segment right internal carotid artery.  Mild ectasia cavernous and supraclinoid segment of the right internal carotid artery.  Mild irregularity and narrowing of the A1 segment of the left anterior cerebral artery.  Bulbous appearance of the anterior communicating artery.  Tiny aneurysm not excluded.  Stability can be confirmed on follow-up.  Middle cerebral artery moderate branch vessel irregularity.  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  No significant stenosis distal left vertebral artery.  Both PICAs visualized with mild irregularity.  No high-grade stenosis of the basilar artery.  Poor  delineation of the AICAs.  Mild irregularity superior cerebellar artery bilaterally.  Posterior cerebral artery branch vessel irregularity bilaterally.  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  Bulbous appearance of the anterior communicating artery.  Tiny aneurysm not excluded.  Stability can be confirmed on follow-up.  Middle cerebral artery moderate branch vessel irregularity.  Poor delineation of the AICAs.  Branch vessel irregularity is noted above.  Critical Value/emergent results were called by telephone at the time of interpretation on 08/28/2011 at 9:55 p.m. to [facility] patients nurse., who verbally acknowledged these results.  Original Report Authenticated By: Fuller Canada, M.D.   Mr Brain Wo Contrast  08/28/2011  *RADIOLOGY REPORT*  Clinical Data:  Slurred speech.  Atrial fibrillation.  High blood pressure.  MRI BRAIN WITHOUT CONTRAST MRA HEAD WITHOUT CONTRAST  Technique: Multiplanar, multiecho pulse sequences of the brain and surrounding structures were obtained according to standard protocol without intravenous contrast.  Angiographic images of the head  were obtained using MRA technique without contrast.  Comparison: 08/28/2011 CT.  04/21/2009 MR.  MRI HEAD  Findings:  Non hemorrhagic acute moderate size infarct extends from the right sub insular/right posterior opercular region into the right posterior frontal - parietal lobe.  Remote infarct posterior left sub insular region.  Moderate small vessel disease type changes.  Global atrophy without hydrocephalus.  No intracranial hemorrhage.  1 cm right-sided pituitary mass without significant change since prior exam.  This is poorly assessed on the present  examination.  Abnormal appearance of the right vertebral artery and left internal carotid artery as discussed below.  Opacification mastoid air cells and middle ear cavities bilaterally.  No obstructing lesions seen in the posterior-superior nasopharynx.  IMPRESSION:  Non hemorrhagic acute moderate size infarct extends from the right sub insular/right posterior opercular region into the right posterior frontal - parietal lobe.  Remote infarct posterior left sub insular region.  Moderate small vessel disease type changes.  Global atrophy without hydrocephalus.  1 cm right-sided pituitary mass without significant change since prior exam.  This is poorly assessed on the present examination.  Abnormal appearance of the right vertebral artery and left internal carotid artery as discussed below.  Opacification mastoid air cells and middle ear cavities bilaterally.  No obstructing lesions seen in the posterior-superior nasopharynx.  MRA HEAD  Findings: Occluded left internal carotid artery with collateral flow with visualization of the left carotid terminus, left middle cerebral artery and left anterior cerebral artery.  Ectatic pre cavernous segment right internal carotid artery.  Mild narrowing cavernous segment right internal carotid artery.  Mild ectasia cavernous and supraclinoid segment of the right internal carotid artery.  Mild irregularity and narrowing of the A1 segment of the left anterior cerebral artery.  Bulbous appearance of the anterior communicating artery.  Tiny aneurysm not excluded.  Stability can be confirmed on follow-up.  Middle cerebral artery moderate branch vessel irregularity.  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  No significant stenosis distal left vertebral artery.  Both PICAs visualized with mild irregularity.  No high-grade stenosis of the basilar artery.  Poor delineation of the AICAs.  Mild irregularity superior cerebellar artery bilaterally.  Posterior  cerebral artery branch vessel irregularity bilaterally.  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  Bulbous appearance of the anterior communicating artery.  Tiny aneurysm not excluded.  Stability can be confirmed on follow-up.  Middle cerebral artery moderate branch vessel irregularity.  Poor delineation of the AICAs.  Branch vessel irregularity is noted above.  Critical Value/emergent results were called by telephone at the time of interpretation on 08/28/2011 at 9:55 p.m. to [facility] patients nurse., who verbally acknowledged these results.  Original Report Authenticated By: Fuller Canada, M.D.   Ct Abdomen Pelvis W Contrast  08/26/2011  *RADIOLOGY REPORT*  Clinical Data: C diff colitis, on flagyl, evaluate for recurrent abscess.  Weight loss, history of diverticulitis, prostate cancer status post XRT.  CT ABDOMEN AND PELVIS WITH CONTRAST  Technique:  Multidetector CT imaging of the abdomen and pelvis was performed following the standard protocol during bolus administration of intravenous contrast.  Contrast: OMNIPAQUE IOHEXOL 300 MG/ML  SOLN  Comparison: 06/22/2011  Findings: Stable partially calcified nodule at the right lung base (  series 6/image 14).  Small hiatal hernia.  Liver, spleen, pancreas, and adrenal glands are within normal limits.  Gallbladder is notable for layering sludge and a small gallstone in the gallbladder fundus (series 2/image 28).  No associated inflammatory changes.  No intrahepatic or extrahepatic ductal dilatation.  Kidneys are within normal limits.  No hydronephrosis.  No evidence of bowel obstruction. Large duodenal diverticulum (series 2/image 36).  Diffuse colonic wall thickening with  associated inflammatory changes, compatible with reported history of pseudomembranous colitis.  No drainable fluid collection or abscess.  No free air clear.  Small volume abdominopelvic ascites.  Atherosclerotic calcifications of the abdominal aorta and branch vessels.  No suspicious abdominopelvic lymphadenopathy.  Concentric bladder wall thickening, correlate for cystitis (series 2/image 80). High density fluid within the bladder may reflect hemorrhagic (series 2/image 81).  Indwelling Foley catheter with associated tiny foci of nondependent gas.  Status post prostatectomy.  Mild degenerative changes of the visualized thoracolumbar spine, most prominent at L5 S1.  IMPRESSION:  Diffuse colonic wall thickening with associated inflammatory changes, compatible with reported history of pseudomembranous colitis.  No drainable fluid collection or abscess.  No free air clear.  Thick-walled bladder, correlate for cystitis.  Suspected bladder hemorrhage.  Indwelling Foley catheter.  Additional ancillary findings as above.  Original Report Authenticated By: Charline Bills, M.D.   Dg Chest Port 1 View  08/26/2011  *RADIOLOGY REPORT*  Clinical Data: Weakness and elevated white cell count.  PORTABLE CHEST - 1 VIEW  Comparison: 07/21/2011  Findings: Stable appearance of postoperative changes in the mediastinum.  Normal heart size and pulmonary vascularity. Emphysematous changes and scattered fibrosis in the lungs.  No focal airspace consolidation.  No blunting of costophrenic angles. No pneumothorax.  Calcified and tortuous aorta.  Mediastinal contours appear intact.  Old right rib fractures.  No significant change since previous study.  IMPRESSION: Emphysema and fibrosis in the lungs.  No evidence of active pulmonary disease.  Original Report Authenticated By: Marlon Pel, M.D.     MDM  Patient is at risk for falls. Bedside commode and wheelchair arrange for by me  Diagnosis weakness        Doug Sou, MD 09/02/11 1610

## 2011-09-02 NOTE — ED Notes (Signed)
ZOX:[id number]<AV> Expected date:<BR> Expected time:<BR> Means of arrival:<BR> Comments:<BR> Pulled foley out. Foley replacement

## 2011-09-02 NOTE — Discharge Summary (Signed)
Physician Discharge Summary  [name] NWG:[id number] DOB: 05-14-1926 DOA: 08/25/2011  PCP: Oliver Barre, MD  Admit date: 08/25/2011 Discharge date: 09/02/2011  Recommendations for Outpatient Follow-up:  1. Followup with PCP one week post discharge. On followup CBC will need to be checked to followup on patient's hemoglobin. A basic metabolic profile will need to be checked to followup on patient's electrolytes and renal function. 2. Patient's PCP will need to start patient on a short acting anticoagulations one week post discharge such as pradaxa for his new stroke with his history of A. fib. On starting this Primaxin will need to discontinue his aspirin need to history of hemorrhagic cystitis. 3. Patient is to followup with Dr. Patsi Sears 1-2 weeks post discharge. 4. Patient is to followup with Dr. Pearlean Brownie of neurology 1 month post discharge  Discharge Diagnoses:  Principal Problem:  *CVA (cerebrovascular accident) Active Problems:  GERD  Hypertension  E-coli UTI  Atrial fibrillation  Protein-calorie malnutrition, severe  Intra-abdominal abscess  Hyponatremia  Hypothyroidism  Clostridium difficile diarrhea  Acute diarrhea  Leukocytosis  Severe malnutrition  Hypokalemia   Discharge Condition: Stable and improved  Diet recommendation: Low sodium diet dysphagia 3 with thin liquids  Filed Weights   08/26/11 0703  Weight: 65.635 kg (144 lb 11.2 oz)    History of present illness:  Pt is an 78 y/o with h/o hemorrhagic cystitis due to radiation therapy for prostate cancer and chronic indwelling catheter. Presented to the hospital secondary to weakness, decrease oral intake, and diarrhea. Has had recent history of C difficil infection and despite treatment not too long ago presents with another bout of c difficil associated diarrhea. Switched from flagyl to oral vancomycin and currently improving.  Patient also noted to have an acute stroke: 08/28/2011   Hospital Course:  1 acute  CVA  During patient's hospitalization patient was noted to have slurred speech and significant left upper extremity weakness. Patient was seen at that time however due to time of symptoms presentation and history of hemorrhagic cystitis patient was deemed not a TPA candidate. Head CT was obtained which was negative. A neurology consultation was also obtained and patient was seen in consultation by Dr. Amada Jupiter and it was felt patient had sustained an acute embolic stroke. Patient underwent stroke workup. Head CT negative. MRI consistent with nonhemorrhagic acute moderate sized infarct extending from the right sub-insula/right posterior patellar region into the right posterior frontal-parietal lobe. MRA of the head with occluded left internal carotid artery with collateral flow. Almost completely occluded right vertebral artery noted on prior exam. 2-D echo showed an EF of 65-70% and mild LVH. Was negative for any sources of emboli.  Fasting lipid panel with LDL of 29. Carotid Doppler showed mild mixed plaque origin and proximal ICA on the right with acoustic shadowing. CEA and appeared patent. No ICA stenosis. Vertebral appears occluded. The left at the occluded ICA and vertebral artery flow is antegrade. Patient failed bedside swallow evaluation. Patient was seen by speech therapy and patient placed on a dysphagia 3 diet with thickened liquids. Patient however refused to drink the thickened liquids and the patient's wife accepted the risk of aspiration and patient was transitioned to thin liquids. Patient was seen by physical therapy and occupational therapy and skilled nursing facility was recommended however patient adamantly refused to go to a skilled nursing facility stating that he would rather die than go back to a skilled nursing facility. Patient was subsequently be discharged home with home health PT OT. Patient  was also placed on aspirin. Patient does have a history of A. fib. Patient has prior  history of CVAs. Patient was initially deemed not anticoagulating candidate secondary to his hemorrhagic cystitis. In light of patient's recent stroke neurology is recommending that patient likely needs anti-coagulation to start approximately one week post discharge with one of the short acting anticoagulant such as dabigatran/pradaxa. This was discussed with Dr. Berneice Heinrich of urology who was on call as well as Dr. Patsi Sears of urology as patient had had a recent hemorrhagic cystitis. Urology with starting patient on  short acting anticoagulations one week post discharge as long as patient's aspirin will be discontinued at that time. This will be deferred to patient's PCP to start. Short acting anticoagulant to be started one week post discharge per neurology recommendations.  patient improved clinically will be discharged in stable and improved condition.  2C difficille Colitis/ diarrhea  -  patient was admitted with a C. difficile colitis and started on oral Flagyl which was subsequently changed to oral vancomycin as this was patient's second episode. Patient improved clinically he had decreased number of stools and will be discharged home on 8 more days of oral vancomycin to complete a two-week course of therapy.  3. H/o intraabdominal abscess  - CT of abdomen performed: No drainable fluid collection or abscess.  4. Leukocytosis  - Related to # 2 and UTI and improving on oral vancomycin and Rocephin.  5. Hyponatremia  - Likely related to poor oral solute intake. Improved with hydration.   6. Severe Malnutrition  - At this point likely related to chronic illness. Should improve with resolution of active c difficil infection.  - Nutritional supplements on board.  7. Atrial fibrillation  - Amiodarone for rate control. ASA . Stable will consider one of the short acting anticoagulant agents one week post discharge per neurology recommendations..  8. Hypothyroidism  - Continue synthroid  9. ECOLI/Klebsiella  oxytoca UTI   during the hospitalization urinalysis which was done was consistent with a UTI. Patient was started on IV Rocephin. Urine cultures grew out Escherichia coli and Klebsiella oxytoca. Patient was transitioned to oral Ceftin and he'll be discharged him on 5 more days of oral antibiotics to complete a ten-day course of therapy. Patient will followup as outpatient. Patient also followup with urology as an outpatient. 10. Hx of hemmorrhagic cystitis   patient did have a prior history of hemorrhagic cystitis. His urine had cleared during this hospitalization. His urine remained clear and he was seen by urology during this hospitalization. It was felt patient needed to followup as an outpatient with urology 1-2 weeks post discharge and continues outpatient hyperbaric treatments.       Procedures: CT head 08/28/2011  MRI/MRA head 08/28/2011  2-D echo 08/29/2011  Carotid Dopplers 08/29/2011   Consultations: Neurology: Dr. Amada Jupiter 08/28/2011  Urology: Dr Patsi Sears     Discharge Exam: Filed Vitals:   09/02/11 0508  BP: 122/80  Pulse: 62  Temp: 98 F (36.7 C)  Resp: 18   Filed Vitals:   09/01/11 1250 09/01/11 1700 09/01/11 2135 09/02/11 0508  BP: 120/71 120/72 121/70 122/80  Pulse: 62 54 60 62  Temp: 98.4 F (36.9 C) 97.2 F (36.2 C) 97.6 F (36.4 C) 98 F (36.7 C)  TempSrc: Oral Oral Oral Oral  Resp: 18 18 18 18   Height:      Weight:      SpO2: 99% 99% 99% 99%   subjective: Patient with no complaints. Patient with small  loose stool. General: Alert, awake, oriented x3, in no acute distress. HEENT: No bruits, no goiter. Heart: Regular rate and rhythm, without murmurs, rubs, gallops. Lungs: Clear to auscultation bilaterally. Abdomen: Soft, nontender, nondistended, positive bowel sounds. Extremities: No clubbing cyanosis or edema with positive pedal pulses. Neuro: Dysarthria improving. Left upper extremity weakness improvement.  Discharge  Instructions  Discharge Orders    Future Appointments: Provider: Department: Dept Phone: Center:   09/24/2011 3:30 PM Corwin Levins, MD [facility] [phone] [facility]     Future Orders Please Complete By Expires   Diet - low sodium heart healthy      Comments:   Mechanical soft diet ( Dysphagia 3) with thin liquids and aspiration precautions.   Increase activity slowly      Discharge instructions      Comments:   Follow up with Oliver Barre, MD in 1 week Follow up with Dr Patsi Sears in 1-2 weeks     Medication List  As of 09/02/2011 11:37 AM   STOP taking these medications         metroNIDAZOLE 500 MG tablet      omeprazole 40 MG capsule         TAKE these medications         ACIDOPHILUS PROBIOTIC 100 MG Caps   Take 1 capsule (100 mg total) by mouth 3 (three) times daily with meals.      amiodarone 200 MG tablet   Commonly known as: PACERONE   Take 200 mg by mouth 2 (two) times daily.      aspirin 81 MG tablet   Take 81 mg by mouth daily.      atorvastatin 20 MG tablet   Commonly known as: LIPITOR   Take 1 tablet (20 mg total) by mouth daily.      cefUROXime 500 MG tablet   Commonly known as: CEFTIN   Take 1 tablet (500 mg total) by mouth 2 (two) times daily with a meal. Take for 5 days then stop.      citalopram 10 MG tablet   Commonly known as: CELEXA   Take 1 tablet (10 mg total) by mouth daily.      famotidine 40 MG tablet   Commonly known as: PEPCID   Take 1 tablet (40 mg total) by mouth daily.      feeding supplement Liqd   Take 237 mLs by mouth 3 (three) times daily between meals.      ferrous sulfate 325 (65 FE) MG tablet   Take 1 tablet (325 mg total) by mouth 2 (two) times daily.      levothyroxine 25 MCG tablet   Commonly known as: SYNTHROID, LEVOTHROID   Take 1 tablet (25 mcg total) by mouth daily before breakfast.      metoprolol succinate 25 MG 24 hr tablet   Commonly known as: TOPROL-XL   Take 1 tablet (25 mg total) by mouth daily.       multivitamin with minerals Tabs   Take 1 tablet by mouth daily.      potassium chloride SA 20 MEQ tablet   Commonly known as: K-DUR,KLOR-CON   Take 1 tablet (20 mEq total) by mouth daily.      thiamine 100 MG tablet   Take 1 tablet (100 mg total) by mouth daily.      vancomycin 50 mg/mL oral solution   Commonly known as: VANCOCIN   Take 2.5 mLs (125 mg total) by mouth every 6 (six) hours. Take for  8 days then stop.      vitamin A & D cream   Apply 1 application topically daily.           Follow-up Information    Follow up with Jethro Bolus I, MD. (Call [phone] for appt 1-2 weeks after discharge home)    Contact information:   [address] [place] [facility] [place] [address] [phone]       Follow up with Oliver Barre, MD. Schedule an appointment as soon as possible for a visit in 1 week.   Contact information:   [address] [facility] [address] [place] [address] [place] [address] [phone]       Follow up with Gates Rigg, MD. Schedule an appointment as soon as possible for a visit in 1 month.   Contact information:   [address], [address] [facility] [place] [address] [phone]           The results of significant diagnostics from this hospitalization (including imaging, microbiology, ancillary and laboratory) are listed below for reference.    Significant Diagnostic Studies: Dg Chest 2 View  08/29/2011  *RADIOLOGY REPORT*  Clinical Data: Stroke, so we  CHEST - 2 VIEW  Comparison: This chest radiograph 82,013  Findings: Sternotomy wires overlie normal heart silhouette . There is mild scarring at the lung base unchanged from prior.  Lungs hyperinflated.  No effusion, infiltrate, or pneumothorax.  IMPRESSION: No significant change. Emphysema and basilar scarring.  Original Report Authenticated By: Genevive Bi, M.D.   Ct Head Wo  Contrast  08/28/2011  *RADIOLOGY REPORT*  Clinical Data: Weakness.  History of prostate cancer.  Slurred speech.  CT HEAD WITHOUT CONTRAST  Technique:  Contiguous axial images were obtained from the base of the skull through the vertex without contrast.  Comparison: 04/29/2011.  Findings: No intracranial hemorrhage.  Global atrophy.  Remote posterior left opercular and right frontal lobe infarct. Prominent small vessel disease type changes.  No CT evidence of large acute infarct.  No intracranial mass lesion detected on this unenhanced exam.  Vascular calcifications.  Opacification mastoid air cells more notable on the right.  IMPRESSION:  No intracranial hemorrhage.  Global atrophy.  Remote posterior left opercular and right frontal lobe infarct.  Prominent small vessel disease type changes.  No CT evidence of large acute infarct.  Opacification mastoid air cells more notable on the right.  Original Report Authenticated By: Fuller Canada, M.D.   Mr [facility] Wo Contrast  08/28/2011  *RADIOLOGY REPORT*  Clinical Data:  Slurred speech.  Atrial fibrillation.  High blood pressure.  MRI BRAIN WITHOUT CONTRAST MRA HEAD WITHOUT CONTRAST  Technique: Multiplanar, multiecho pulse sequences of the brain and surrounding structures were obtained according to standard protocol without intravenous contrast.  Angiographic images of the head were obtained using MRA technique without contrast.  Comparison: 08/28/2011 CT.  04/21/2009 MR.  MRI HEAD  Findings:  Non hemorrhagic acute moderate size infarct extends from the right sub insular/right posterior opercular region into the right posterior frontal - parietal lobe.  Remote infarct posterior left sub insular region.  Moderate small vessel disease type changes.  Global atrophy without hydrocephalus.  No intracranial hemorrhage.  1 cm right-sided pituitary mass without significant change since prior exam.  This is poorly assessed on the present examination.  Abnormal appearance  of the right vertebral artery and left internal carotid artery as discussed below.  Opacification mastoid air  cells and middle ear cavities bilaterally.  No obstructing lesions seen in the posterior-superior nasopharynx.  IMPRESSION:  Non hemorrhagic acute moderate size infarct extends from the right sub insular/right posterior opercular region into the right posterior frontal - parietal lobe.  Remote infarct posterior left sub insular region.  Moderate small vessel disease type changes.  Global atrophy without hydrocephalus.  1 cm right-sided pituitary mass without significant change since prior exam.  This is poorly assessed on the present examination.  Abnormal appearance of the right vertebral artery and left internal carotid artery as discussed below.  Opacification mastoid air cells and middle ear cavities bilaterally.  No obstructing lesions seen in the posterior-superior nasopharynx.  MRA HEAD  Findings: Occluded left internal carotid artery with collateral flow with visualization of the left carotid terminus, left middle cerebral artery and left anterior cerebral artery.  Ectatic pre cavernous segment right internal carotid artery.  Mild narrowing cavernous segment right internal carotid artery.  Mild ectasia cavernous and supraclinoid segment of the right internal carotid artery.  Mild irregularity and narrowing of the A1 segment of the left anterior cerebral artery.  Bulbous appearance of the anterior communicating artery.  Tiny aneurysm not excluded.  Stability can be confirmed on follow-up.  Middle cerebral artery moderate branch vessel irregularity.  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  No significant stenosis distal left vertebral artery.  Both PICAs visualized with mild irregularity.  No high-grade stenosis of the basilar artery.  Poor delineation of the AICAs.  Mild irregularity superior cerebellar artery bilaterally.  Posterior cerebral artery branch vessel  irregularity bilaterally.  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  Bulbous appearance of the anterior communicating artery.  Tiny aneurysm not excluded.  Stability can be confirmed on follow-up.  Middle cerebral artery moderate branch vessel irregularity.  Poor delineation of the AICAs.  Branch vessel irregularity is noted above.  Critical Value/emergent results were called by telephone at the time of interpretation on 08/28/2011 at 9:55 p.m. to [facility] patients nurse., who verbally acknowledged these results.  Original Report Authenticated By: Fuller Canada, M.D.   Mr Brain Wo Contrast  08/28/2011  *RADIOLOGY REPORT*  Clinical Data:  Slurred speech.  Atrial fibrillation.  High blood pressure.  MRI BRAIN WITHOUT CONTRAST MRA HEAD WITHOUT CONTRAST  Technique: Multiplanar, multiecho pulse sequences of the brain and surrounding structures were obtained according to standard protocol without intravenous contrast.  Angiographic images of the head were obtained using MRA technique without contrast.  Comparison: 08/28/2011 CT.  04/21/2009 MR.  MRI HEAD  Findings:  Non hemorrhagic acute moderate size infarct extends from the right sub insular/right posterior opercular region into the right posterior frontal - parietal lobe.  Remote infarct posterior left sub insular region.  Moderate small vessel disease type changes.  Global atrophy without hydrocephalus.  No intracranial hemorrhage.  1 cm right-sided pituitary mass without significant change since prior exam.  This is poorly assessed on the present examination.  Abnormal appearance of the right vertebral artery and left internal carotid artery as discussed below.   Opacification mastoid air cells and middle ear cavities bilaterally.  No obstructing lesions seen in the posterior-superior nasopharynx.  IMPRESSION:  Non hemorrhagic acute moderate size infarct extends from the right  sub insular/right posterior opercular region into the right posterior frontal - parietal lobe.  Remote infarct posterior left sub insular region.  Moderate small vessel disease type changes.  Global atrophy without hydrocephalus.  1 cm right-sided pituitary mass without significant change since prior exam.  This is poorly assessed on the present examination.  Abnormal appearance of the right vertebral artery and left internal carotid artery as discussed below.  Opacification mastoid air cells and middle ear cavities bilaterally.  No obstructing lesions seen in the posterior-superior nasopharynx.  MRA HEAD  Findings: Occluded left internal carotid artery with collateral flow with visualization of the left carotid terminus, left middle cerebral artery and left anterior cerebral artery.  Ectatic pre cavernous segment right internal carotid artery.  Mild narrowing cavernous segment right internal carotid artery.  Mild ectasia cavernous and supraclinoid segment of the right internal carotid artery.  Mild irregularity and narrowing of the A1 segment of the left anterior cerebral artery.  Bulbous appearance of the anterior communicating artery.  Tiny aneurysm not excluded.  Stability can be confirmed on follow-up.  Middle cerebral artery moderate branch vessel irregularity.  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  No significant stenosis distal left vertebral artery.  Both PICAs visualized with mild irregularity.  No high-grade stenosis of the basilar artery.  Poor delineation of the AICAs.  Mild irregularity superior cerebellar artery bilaterally.  Posterior cerebral artery branch vessel irregularity bilaterally.  IMPRESSION: Occluded left internal carotid artery with  collateral flow with visualization of the left carotid terminus, left middle cerebral artery and left anterior cerebral artery (this was noted to be occluded on the prior MR).  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  Bulbous appearance of the anterior communicating artery.  Tiny aneurysm not excluded.  Stability can be confirmed on follow-up.  Middle cerebral artery moderate branch vessel irregularity.  Poor delineation of the AICAs.  Branch vessel irregularity is noted above.  Critical Value/emergent results were called by telephone at the time of interpretation on 08/28/2011 at 9:55 p.m. to [facility] patients nurse., who verbally acknowledged these results.  Original Report Authenticated By: Fuller Canada, M.D.   Ct Abdomen Pelvis W Contrast  08/26/2011  *RADIOLOGY REPORT*  Clinical Data: C diff colitis, on flagyl, evaluate for recurrent abscess.  Weight loss, history of diverticulitis, prostate cancer status post XRT.  CT ABDOMEN AND PELVIS WITH CONTRAST  Technique:  Multidetector CT imaging of the abdomen and pelvis was performed following the standard protocol during bolus administration of intravenous contrast.  Contrast: OMNIPAQUE IOHEXOL 300 MG/ML  SOLN  Comparison: 06/22/2011  Findings: Stable partially calcified nodule at the right lung base (series 6/image 14).  Small hiatal hernia.  Liver, spleen, pancreas, and adrenal glands are within normal limits.  Gallbladder is notable for layering sludge and a small gallstone in the gallbladder fundus (series 2/image 28).  No associated inflammatory changes.  No intrahepatic or extrahepatic ductal dilatation.  Kidneys are within normal limits.  No hydronephrosis.  No evidence of bowel obstruction. Large duodenal diverticulum (series 2/image 36).  Diffuse colonic wall thickening with associated inflammatory changes, compatible with reported history of pseudomembranous colitis.  No drainable  fluid collection or abscess.  No free air clear.  Small volume abdominopelvic ascites.  Atherosclerotic calcifications of the abdominal aorta and branch vessels.  No suspicious abdominopelvic lymphadenopathy.  Concentric bladder wall thickening, correlate for cystitis (series 2/image 80). High density fluid within the bladder may reflect  hemorrhagic (series 2/image 81).  Indwelling Foley catheter with associated tiny foci of nondependent gas.  Status post prostatectomy.  Mild degenerative changes of the visualized thoracolumbar spine, most prominent at L5 S1.  IMPRESSION:  Diffuse colonic wall thickening with associated inflammatory changes, compatible with reported history of pseudomembranous colitis.  No drainable fluid collection or abscess.  No free air clear.  Thick-walled bladder, correlate for cystitis.  Suspected bladder hemorrhage.  Indwelling Foley catheter.  Additional ancillary findings as above.  Original Report Authenticated By: Charline Bills, M.D.   Dg Chest Port 1 View  08/26/2011  *RADIOLOGY REPORT*  Clinical Data: Weakness and elevated white cell count.  PORTABLE CHEST - 1 VIEW  Comparison: 07/21/2011  Findings: Stable appearance of postoperative changes in the mediastinum.  Normal heart size and pulmonary vascularity. Emphysematous changes and scattered fibrosis in the lungs.  No focal airspace consolidation.  No blunting of costophrenic angles. No pneumothorax.  Calcified and tortuous aorta.  Mediastinal contours appear intact.  Old right rib fractures.  No significant change since previous study.  IMPRESSION: Emphysema and fibrosis in the lungs.  No evidence of active pulmonary disease.  Original Report Authenticated By: Marlon Pel, M.D.    Microbiology: Recent Results (from the past 240 hour(s))  URINE CULTURE     Status: Normal   Collection Time   08/26/11  1:46 AM      Component Value Range Status Comment   Specimen Description URINE, CATHETERIZED   Final    Special  Requests NONE   Final    Culture  Setup Time 08/26/2011 09:52   Final    Colony Count >=100,000 COLONIES/ML   Final    Culture     Final    Value: ESCHERICHIA COLI     KLEBSIELLA OXYTOCA   Report Status 08/29/2011 FINAL   Final    Organism ID, Bacteria ESCHERICHIA COLI   Final    Organism ID, Bacteria KLEBSIELLA OXYTOCA   Final   CULTURE, BLOOD (ROUTINE X 2)     Status: Normal   Collection Time   08/26/11  3:10 AM      Component Value Range Status Comment   Specimen Description BLOOD LEFT HAND   Final    Special Requests BOTTLES DRAWN AEROBIC AND ANAEROBIC 3CC   Final    Culture  Setup Time 08/26/2011 08:56   Final    Culture NO GROWTH 5 DAYS   Final    Report Status 09/01/2011 FINAL   Final   CULTURE, BLOOD (ROUTINE X 2)     Status: Normal   Collection Time   08/26/11  3:20 AM      Component Value Range Status Comment   Specimen Description BLOOD RIGHT ANTECUBITAL   Final    Special Requests BOTTLES DRAWN AEROBIC AND ANAEROBIC [employer]   Final    Culture  Setup Time 08/26/2011 08:56   Final    Culture NO GROWTH 5 DAYS   Final    Report Status 09/01/2011 FINAL   Final   STOOL CULTURE     Status: Normal   Collection Time   08/26/11  7:30 AM      Component Value Range Status Comment   Specimen Description STOOL   Final    Special Requests Normal   Final    Culture     Final    Value: NO SALMONELLA, SHIGELLA, CAMPYLOBACTER, YERSINIA, OR E.COLI 0157:H7 ISOLATED   Report Status 08/30/2011 FINAL   Final   CLOSTRIDIUM DIFFICILE  BY PCR     Status: Abnormal   Collection Time   08/26/11  7:30 AM      Component Value Range Status Comment   C difficile by pcr POSITIVE (*) NEGATIVE Final      Labs: Basic Metabolic Panel:  Lab 09/02/11 1610 09/01/11 0415 08/31/11 0450 08/30/11 0430 08/29/11 0525 08/27/11 0500  NA 135 138 133* 131* 131* --  K 3.7 3.7 3.1* 3.5 3.4* --  CL 108 111 105 103 105 --  CO2 19 20 21 21 21  --  GLUCOSE 100* 109* 125* 88 93 --  BUN 9 10 11 15 21  --  CREATININE 0.65 0.65  0.62 0.66 0.68 --  CALCIUM 7.4* 7.3* 7.2* 7.3* 7.0* --  MG -- -- 1.9 -- 2.1 2.2  PHOS -- -- -- -- -- 3.6   Liver Function Tests: No results found for this basename: AST:5,ALT:5,ALKPHOS:5,BILITOT:5,PROT:5,ALBUMIN:5 in the last 168 hours No results found for this basename: LIPASE:5,AMYLASE:5 in the last 168 hours No results found for this basename: AMMONIA:5 in the last 168 hours CBC:  Lab 08/31/11 0450 08/30/11 0430 08/29/11 0525 08/28/11 0910  WBC 9.2 8.3 7.2 11.2*  NEUTROABS -- 6.8 5.8 --  HGB 9.1* 9.7* 8.8* 9.0*  HCT 27.4* 29.6* 26.8* 27.2*  MCV 86.7 87.8 87.3 87.5  PLT 276 328 306 332   Cardiac Enzymes: No results found for this basename: CKTOTAL:5,CKMB:5,CKMBINDEX:5,TROPONINI:5 in the last 168 hours BNP: BNP (last 3 results)  Basename 07/21/11 2335 04/29/11 0130 04/28/11 1002  PROBNP 1170.0* 11811.0* 10058.0*   CBG: No results found for this basename: GLUCAP:5 in the last 168 hours  Time coordinating discharge: 60 minutes  Signed:  Luz Mares  [facility] 09/02/2011, 11:37 AM

## 2011-09-02 NOTE — ED Notes (Signed)
[facility] arrived to transport pt back home. 

## 2011-09-02 NOTE — Progress Notes (Signed)
[facility] ED Cm consulted by [facility] ED, Jacubowitz to assist with DME for home d/c (wheel chair and bedside commode) [facility] with [facility], PT/OT, aide per [facility] [phone] New referral entered in [facility] with confirmation at 1920 09/02/11 At 1923 spoke with Sue Lush at [facility] wt 144 lbs Cm updated pt/wife and provided [facility] contact information

## 2011-09-02 NOTE — Progress Notes (Signed)
Patient is set to discharge home today via [facility], transport called for 3pm pickup. Patient & wife at bedside and aware.   Clinical Social Work Department CLINICAL SOCIAL WORK PLACEMENT NOTE 09/02/2011  Patient:  BRAILYN, DELMAN  Account Number:  [id number] Admit date:  08/25/2011  Clinical Social Worker:  Orpah Greek  Date/time:  08/30/2011 02:57 PM  Clinical Social Work is seeking post-discharge placement for this patient at the following level of care:   SKILLED NURSING   (*CSW will update this form in Epic as items are completed)   08/30/2011  Patient/family provided with [facility] Department of Clinical Social Work's list of facilities offering this level of care within the geographic area requested by the patient (or if unable, by the patient's family).  08/30/2011  Patient/family informed of their freedom to choose among providers that offer the needed level of care, that participate in Medicare, Medicaid or managed care program needed by the patient, have an available bed and are willing to accept the patient.  08/30/2011  Patient/family informed of [facility]' ownership interest in [facility], as well as of the fact that they are under no obligation to receive care at this facility.  PASARR submitted to EDS on 08/30/2011 PASARR number received from EDS on 08/30/2011  FL2 transmitted to all facilities in geographic area requested by pt/family on  08/30/2011 FL2 transmitted to all facilities within larger geographic area on   Patient informed that his/her managed care company has contracts with or will negotiate with  certain facilities, including the following:     Patient/family informed of bed offers received:  08/31/2011 Patient chooses bed at *refused bed offers* Physician recommends and patient chooses bed at    Patient to be transferred home on  09/02/2011 Patient to be transferred to facility by [facility]  The following physician request were entered  in Epic:   Additional Comments:  Unice Bailey, LCSW [facility] Clinical Social Worker cell #: [phone]

## 2011-09-02 NOTE — ED Notes (Signed)
[facility] called to take pt back to residence.  

## 2011-09-02 NOTE — ED Notes (Addendum)
MD at bedside. Dr. Jacubowitz at bedside.  

## 2011-09-02 NOTE — Progress Notes (Signed)
Received call from EMS stating that the patient had diarrhea on the way to the pt's house, that he pulled out his foley with the balloon intact and that he was now on his knees in the bathroom.  The patient's wife stated to EMS that she had back trouble and was unable to take care of him at home.  I spoke with EMS and to the patient's wife.  I instructed the patient's wife that she needed to contact the Urology office about the catheter or take the patient back to the ED.  I strongly encouraged [name] to discuss going to [facility] with her husband again if they were returning to the ED.  I then spoke with EMS who is going to stay with the patient and his wife until she makes contact with the Urology office. Nino Parsley

## 2011-09-02 NOTE — Progress Notes (Signed)
Occupational Therapy Treatment Patient Details Name: [name] MRN: [id number] DOB: 25-Nov-1926 Today's Date: 09/02/2011 Time: 4098-1191 OT Time Calculation (min): 34 min  OT Assessment / Plan / Recommendation Comments on Treatment Session provided L walker splint.  Wife present to take pt home:  would benefit from [facility] due to decreased balance and safety.    Follow Up Recommendations  Skilled nursing facility (feel pt would be safer at [facility]. If home, HHOT)    Barriers to Discharge       Equipment Recommendations  3 in 1 bedside comode    Recommendations for Other Services    Frequency Min 3X/week   Plan      Precautions / Restrictions Precautions Precautions: Fall Precaution Comments: L side weakness from CVA Restrictions Weight Bearing Restrictions: No   Pertinent Vitals/Pain No pain    ADL  Eating/Feeding: Performed;Supervision/safety (max cues for beverage) Toilet Transfer: Simulated;Moderate assistance (with Left walker splint, 3 feet to chair) Transfers/Ambulation Related to ADLs: min A for sit to stand; mod A stepping with walker for safety, with L walker splint ADL Comments: Wife plans to take pt home:  she came at end of session and I showed her walker splint.  pt needs max cues for sip size, impulsive with movement    OT Diagnosis:    OT Problem List:   OT Treatment Interventions:     OT Goals ADL Goals Pt Will Transfer to Toilet: with min assist;3-in-1;Stand pivot transfer ADL Goal: Toilet Transfer - Progress: Progressing toward goals  Visit Information  Last OT Received On: 09/02/11 Assistance Needed: +1 (transfer)    Subjective Data      Prior Functioning       Cognition  Overall Cognitive Status: Impaired Area of Impairment: Attention;Safety/judgement;Awareness of deficits Arousal/Alertness: Awake/alert Behavior During Session: [facility] for tasks performed Current Attention Level: Sustained Attention - Other Comments:  (2 minutes, mobility;  30 seconds beverage) Following Commands: Follows one step commands consistently Safety/Judgement: Decreased awareness of need for assistance;Decreased safety judgement for tasks assessed;Impulsive    Mobility Bed Mobility Supine to Sit: 4: Min assist;HOB elevated Transfers Sit to Stand: 4: Min assist;With upper extremity assist;From elevated surface;From bed Details for Transfer Assistance: mod cues for hand/feet placement   Exercises    Balance    End of Session OT - End of Session Equipment Utilized During Treatment: Gait belt Activity Tolerance: Patient tolerated treatment well Patient left: in chair;with call bell/phone within reach;with chair alarm set  GO     [name] 09/02/2011, 3:28 PM Marica Otter, OTR/L [phone] 09/02/2011

## 2011-09-02 NOTE — Progress Notes (Signed)
Pt and wife voiced understanding and is appreciative of services ED RN and EDP updated Pt ready for d/c Cm signing off

## 2011-09-02 NOTE — ED Notes (Signed)
Pt just d/c'ed from [facility] with dx of E. Coli/C. Diff. Pt went home with [facility]. As soon as pt got home he accidently pulled Foley out. Foley had bulb inflated. Pt a/o x 3. [facility] staff reports pt had episode of diarrhea prior to coming to ED.

## 2011-09-03 ENCOUNTER — Emergency Department (HOSPITAL_COMMUNITY): Payer: Medicare Other

## 2011-09-03 ENCOUNTER — Encounter (HOSPITAL_COMMUNITY): Payer: Self-pay | Admitting: Emergency Medicine

## 2011-09-03 ENCOUNTER — Emergency Department (HOSPITAL_COMMUNITY)
Admission: EM | Admit: 2011-09-03 | Discharge: 2011-09-03 | Disposition: A | Discharge status: Home / Self Care | Payer: Medicare Other | Attending: Emergency Medicine | Admitting: Emergency Medicine

## 2011-09-03 ENCOUNTER — Ambulatory Visit: Payer: Medicare Other | Admitting: Internal Medicine

## 2011-09-03 DIAGNOSIS — Z79899 Other long term (current) drug therapy: Secondary | ICD-10-CM | POA: Insufficient documentation

## 2011-09-03 DIAGNOSIS — N390 Urinary tract infection, site not specified: Secondary | ICD-10-CM

## 2011-09-03 DIAGNOSIS — W19XXXA Unspecified fall, initial encounter: Secondary | ICD-10-CM | POA: Insufficient documentation

## 2011-09-03 DIAGNOSIS — I10 Essential (primary) hypertension: Secondary | ICD-10-CM | POA: Insufficient documentation

## 2011-09-03 DIAGNOSIS — I2510 Atherosclerotic heart disease of native coronary artery without angina pectoris: Secondary | ICD-10-CM | POA: Insufficient documentation

## 2011-09-03 DIAGNOSIS — A498 Other bacterial infections of unspecified site: Secondary | ICD-10-CM

## 2011-09-03 DIAGNOSIS — Z8673 Personal history of transient ischemic attack (TIA), and cerebral infarction without residual deficits: Secondary | ICD-10-CM | POA: Insufficient documentation

## 2011-09-03 DIAGNOSIS — Z951 Presence of aortocoronary bypass graft: Secondary | ICD-10-CM | POA: Insufficient documentation

## 2011-09-03 DIAGNOSIS — E785 Hyperlipidemia, unspecified: Secondary | ICD-10-CM | POA: Insufficient documentation

## 2011-09-03 DIAGNOSIS — R269 Unspecified abnormalities of gait and mobility: Secondary | ICD-10-CM | POA: Insufficient documentation

## 2011-09-03 DIAGNOSIS — Z954 Presence of other heart-valve replacement: Secondary | ICD-10-CM | POA: Insufficient documentation

## 2011-09-03 DIAGNOSIS — B9689 Other specified bacterial agents as the cause of diseases classified elsewhere: Secondary | ICD-10-CM | POA: Insufficient documentation

## 2011-09-03 DIAGNOSIS — R0902 Hypoxemia: Secondary | ICD-10-CM | POA: Insufficient documentation

## 2011-09-03 LAB — CBC WITH DIFFERENTIAL/PLATELET
Eosinophils Relative: 0 % (ref 0–5)
HCT: 28.8 % — ABNORMAL LOW (ref 39.0–52.0)
Lymphs Abs: 1 10*3/uL (ref 0.7–4.0)
MCH: 28.7 pg (ref 26.0–34.0)
MCV: 88.1 fL (ref 78.0–100.0)
Monocytes Absolute: 0.7 10*3/uL (ref 0.1–1.0)
Monocytes Relative: 5 % (ref 3–12)
Neutro Abs: 12.4 10*3/uL — ABNORMAL HIGH (ref 1.7–7.7)
Platelets: 342 10*3/uL (ref 150–400)
RBC: 3.27 MIL/uL — ABNORMAL LOW (ref 4.22–5.81)
RDW: 17.8 % — ABNORMAL HIGH (ref 11.5–15.5)
WBC: 14.1 10*3/uL — ABNORMAL HIGH (ref 4.0–10.5)

## 2011-09-03 LAB — URINALYSIS, ROUTINE W REFLEX MICROSCOPIC
Nitrite: NEGATIVE
Protein, ur: 30 mg/dL — AB
Urobilinogen, UA: 0.2 mg/dL (ref 0.0–1.0)

## 2011-09-03 LAB — COMPREHENSIVE METABOLIC PANEL
BUN: 13 mg/dL (ref 6–23)
CO2: 22 mEq/L (ref 19–32)
Calcium: 7.9 mg/dL — ABNORMAL LOW (ref 8.4–10.5)
Chloride: 106 mEq/L (ref 96–112)
Creatinine, Ser: 0.75 mg/dL (ref 0.50–1.35)
GFR calc Af Amer: >90 mL/min (ref >90)
GFR calc non Af Amer: 83 mL/min — ABNORMAL LOW (ref >90)
Total Bilirubin: 0.3 mg/dL (ref 0.3–1.2)

## 2011-09-03 LAB — URINE MICROSCOPIC-ADD ON

## 2011-09-03 LAB — LACTIC ACID, PLASMA: Lactic Acid, Venous: 1.5 mmol/L (ref 0.5–2.2)

## 2011-09-03 MED ORDER — VANCOMYCIN 50 MG/ML ORAL SOLUTION
125.0000 mg | Freq: Once | ORAL | Status: AC
  Administered 2011-09-03: 125 mg via ORAL
  Filled 2011-09-03: qty 2.5

## 2011-09-03 MED ORDER — CEFUROXIME AXETIL 500 MG PO TABS
500.0000 mg | ORAL_TABLET | Freq: Two times a day (BID) | ORAL | Status: DC
  Administered 2011-09-03: 500 mg via ORAL
  Filled 2011-09-03: qty 1

## 2011-09-03 NOTE — ED Notes (Signed)
Phone call made to wife for patient

## 2011-09-03 NOTE — Progress Notes (Signed)
CSW met with the pt and family to discuss the pt's wishes regarding SNF placement. Pt adamantly states that he does not want to go to a SNF, stating that he was in [facility] previously and had a negative experiences. Pt's wife shared that she was concerned with her ability to care for the pt at home though understood that if the pt refused to go to a SNF then the hospital could not force the pt. CSW reviewed with the pt and wife what assistive services would help the pt to stay safe in the home. Pt was agreeable to all the home health services that the ED CM had previously discussed with them, though wife stated she was unsure as to whether she really needed an aide and SW with home health. CSW explained that it would be beneficial to have the max amount of services set up now and then cancel services that she felt were not needed. Pt's wife agreed. CSW also reviewed private duty nursing as the ED CM already had. Pt and wife expressed no further concerns at this time. Pt and wife verbalized understanding of how to pursue SNF placement from home, should the pt change his mind and agree to SNF placement.   No further needs identified at this time. Consult back to CSW as needed.

## 2011-09-03 NOTE — ED Notes (Signed)
O2 finger sensor changed to ear-placed on 2L for comfort

## 2011-09-03 NOTE — Progress Notes (Signed)
[facility] called to transport patient back home.  

## 2011-09-03 NOTE — ED Provider Notes (Signed)
History     CSN: [id number]  Arrival date & time 09/03/11  1152   First MD Initiated Contact with Patient 09/03/11 1200      Chief Complaint  Patient presents with  . wants foley replaced     (Consider location/radiation/quality/duration/timing/severity/associated sxs/prior treatment) HPI Patient is an 78 year old male who presents today complaining of wanting his Foley catheter replaced. Patient denies any pain whatsoever. Patient was discharged from the hospital yesterday after stroke.  Patient was seen in this emergency department yesterday following a fall upon reaching home where his Foley was pulled out. At that time patient was evaluated and urology was consulted. Patient is normally followed by Dr. Patsi Sears. Dr. Brunilda Payor he was spoken with and recommended that the patient have no replacement of Foley. Patient is incontinent of urine at baseline secondary to history of prostate adenocarcinoma. Today patient is here as he reported that he was up to the bathroom numerous times last night and he feels that he needs this replaced. He is alert and oriented. As it turns out patient also has C. difficile for which she is on oral vancomycin. We discussed that a Foley catheter would not change the frequency with which he would need to use the toilet. I spoke with the patient's wife who reported by phone that she called EMS after the [facility] nurse who was assisting them recommended this given that the patient's wife is concerned about safely caring for the patient. Both the wife and the patient confirmed that the patient adamantly refuses services outside of the home. He does not have history of dementia and he does have capacity to do this at this time. There are no other associated or modifying factors.  Past Medical History  Diagnosis Date  . ADENOCARCINOMA, PROSTATE   . ALLERGIC RHINITIS   . B12 DEFICIENCY   . BENIGN PROSTATIC HYPERTROPHY   . CAROTID ARTERY DISEASE   .  CEREBROVASCULAR ACCIDENT, HX OF   . CORONARY ARTERY DISEASE   . DIVERTICULOSIS, COLON   . GERD   . HEMORRHOIDS   . HIATAL HERNIA   . HYPERLIPIDEMIA   . HYPERTENSION   . OSTEOPENIA   . PEPTIC ULCER DISEASE   . PERIPHERAL VASCULAR DISEASE   . Personal history of alcoholism   . PROSTATE CANCER, HX OF   . PUD, HX OF   . TRANSIENT ISCHEMIC ATTACK   . Shingles   . Anemia   . AS (aortic stenosis)   . S/P AVR (aortic valve replacement) 09/23/2010  . Complication of anesthesia 09/23/2010    very confused after waking up. Stopped drinking 40 days before this surgery.  . Transfusion reaction, chill fever type 04/28/2011    Fever, rigors, tachycardia,tachypnea but no evidence of hemolysis  . Hemorrhagic cystitis 04/28/2011    Due to previous radiation for PSA recurrence of prostate cancer  . Blood transfusion   . Gross hematuria   . Stroke   . CVA (cerebrovascular accident) 08/29/2011    08/28/11    Past Surgical History  Procedure Date  . Popliteal synovial cyst excision   . Right carotid endarterectomy 1999    Left carotid total chronic occlusion  . Prostate surgery   . Cardic stent 2004  . Aortic valve replacement 09/23/2010    #33mm [facility] Ease pericardial tissue valve  . Coronary artery bypass graft 09/23/2010    CABG x1 using LIMA to LAD  . Cystoscopy with biopsy 02/15/2011    Procedure: CYSTOSCOPY WITH BIOPSY;  Surgeon: Anner Crete, MD;  Location: [facility] ORS;  Service: Urology;  Laterality: N/A;  Cystoscopy, biopsy and fulguration of the bladder    Family History  Problem Relation Age of Onset  . Colon cancer Mother 65  . Diabetes Sister   . Hypertension Sister   . Coronary artery disease Sister   . Multiple sclerosis Sister   . Stroke Brother     History  Substance Use Topics  . Smoking status: Former Smoker    Quit date: 02/14/1958  . Smokeless tobacco: Never Used  . Alcohol Use: No     last drink 3 days ago, he has not been drinking as much      Review of  Systems  Constitutional: Negative.   HENT: Negative.   Eyes: Negative.   Respiratory: Negative.   Cardiovascular: Negative.   Gastrointestinal: Positive for diarrhea.       Known diagnosis of C. difficile on oral vancomycin  Genitourinary: Negative.   Musculoskeletal: Negative.   Skin: Negative.   Neurological: Negative.   Hematological: Negative.   Psychiatric/Behavioral: Negative.   All other systems reviewed and are negative.    Allergies  Tolterodine tartrate  Home Medications   Current Outpatient Rx  Name Route Sig Dispense Refill  . AMIODARONE HCL 200 MG PO TABS Oral Take 200 mg by mouth 2 (two) times daily.    . ASPIRIN 81 MG PO TABS Oral Take 81 mg by mouth daily.    . ATORVASTATIN CALCIUM 20 MG PO TABS Oral Take 20 mg by mouth daily.    Marland Kitchen CEFUROXIME AXETIL 500 MG PO TABS Oral Take 500 mg by mouth 2 (two) times daily with a meal. Take for 5 days then stop.    Marland Kitchen CITALOPRAM HYDROBROMIDE 10 MG PO TABS Oral Take 10 mg by mouth daily.    Marland Kitchen FAMOTIDINE 40 MG PO TABS Oral Take 40 mg by mouth daily.    Marland Kitchen ENSURE COMPLETE PO LIQD Oral Take 237 mLs by mouth 3 (three) times daily between meals.    Di Kindle SULFATE 325 (65 FE) MG PO TABS Oral Take 325 mg by mouth 2 (two) times daily.    . ACIDOPHILUS PROBIOTIC 100 MG PO CAPS Oral Take 1 capsule by mouth 3 (three) times daily with meals.    Marland Kitchen LEVOTHYROXINE SODIUM 25 MCG PO TABS Oral Take 25 mcg by mouth daily before breakfast.    . METOPROLOL SUCCINATE ER 25 MG PO TB24 Oral Take 25 mg by mouth daily.    . ADULT MULTIVITAMIN W/MINERALS CH Oral Take 1 tablet by mouth daily.    Marland Kitchen POTASSIUM CHLORIDE CRYS ER 20 MEQ PO TBCR Oral Take 20 mEq by mouth daily.    . THIAMINE HCL 100 MG PO TABS Oral Take 100 mg by mouth daily.    Marland Kitchen VANCOMYCIN 50 MG/ML ORAL SOLUTION Oral Take 125 mg by mouth every 6 (six) hours. Take for 8 days then stop.    Marland Kitchen SWEEN EX CREA Topical Apply 1 application topically daily.      BP 105/58  Pulse 62  Temp 97.9 F  (36.6 C) (Oral)  Resp 16  SpO2 100%  Physical Exam  Nursing note and vitals reviewed. GEN: Well-developed, well-nourished male in no distress HEENT: Small bruises over the left forehead, normocephalic. Oropharynx clear without erythema EYES: PERRLA BL, no scleral icterus. NECK: Trachea midline, no meningismus CV: regular rate and rhythm. No murmurs, rubs, or gallops PULM: No respiratory distress.  No crackles, wheezes,  or rales. GI: soft, non-tender. No guarding, rebound, or tenderness. + bowel sounds  GU: Slight excoriation noted at the urethral meatus. No other abnormalities noted. No blood at the urethral meatus  Neuro:  A and O x 3, a neurologic baseline per family. Patient is able to ambulate with minimal assistance but has been having frequent falls. Wife reports he has had one fall where he struck his head since his last visit yesterday. MSK: Patient moves all 4 extremities symmetrically, no deformity, edema, or injury noted Skin: No rashes petechiae, purpura, or jaundice Psych: no abnormality of mood   ED Course  Procedures (including critical care time)  Labs Reviewed  CBC WITH DIFFERENTIAL - Abnormal; Notable for the following:    WBC 14.1 (*)     RBC 3.27 (*)     Hemoglobin 9.4 (*)     HCT 28.8 (*)     RDW 17.8 (*)     Neutrophils Relative 88 (*)     Lymphocytes Relative 7 (*)     Neutro Abs 12.4 (*)     All other components within normal limits  COMPREHENSIVE METABOLIC PANEL - Abnormal; Notable for the following:    Calcium 7.9 (*)     Total Protein 4.6 (*)     Albumin 1.7 (*)     AST 59 (*)     Alkaline Phosphatase 156 (*)     GFR calc non Af Amer 83 (*)     All other components within normal limits  LACTIC ACID, PLASMA  URINALYSIS, ROUTINE W REFLEX MICROSCOPIC   Dg Chest 2 View  09/03/2011  *RADIOLOGY REPORT*  Clinical Data: Hypoxia.  CHEST - 2 VIEW  Comparison: 08/28/2011  Findings: Heart size is normal.  Patient has had median sternotomy and valve  replacement.  There are atelectatic changes in the lung bases bilaterally.  There are no focal consolidations.  Small bilateral pleural effusions are present.  The patient has multiple old rib fractures.  No pulmonary edema.  IMPRESSION:  1.  Postoperative changes. 2.  Bilateral basilar atelectasis. 3. No evidence for acute cardiopulmonary abnormality.  Original Report Authenticated By: Patterson Hammersmith, M.D.   Ct Head Wo Contrast  09/03/2011  *RADIOLOGY REPORT*  Clinical Data: Fall  CT HEAD WITHOUT CONTRAST  Technique:  Contiguous axial images were obtained from the base of the skull through the vertex without contrast.  Comparison: 08/28/2011 MRI  Findings: There is low density involving the gray and white matter in the right peri insular cortex posteriorly extending into the high and lateral right frontal lobe.  This includes effacement of the sulci and is compatible with an evolving infarct as noted on the recent MRI.  There is no evidence of hemorrhagic transformation.  Chronic ischemic changes in the periventricular white matter and atrophy are superimposed.  Heterogeneous mass in the right sella turcica is not significantly changed.  No evidence of extra-axial hemorrhage.  No evidence of traumatic brain contusion or midline shift.  Opacification of the bilateral mastoid air cells is stable.  Visualized paranasal sinuses demonstrate minimal mucosal thickening in the anterior ethmoid air cells.  Cranium is intact without skull fracture.  IMPRESSION: Evolving right MCA infarct without evidence of hemorrhagic transformation.  No evidence of traumatic injury to the cranium or brain parenchyma.  Original Report Authenticated By: [name], M.D.     1. Fall at home   2. Clostridium difficile infection   3. UTI (urinary tract infection)  MDM  Patient was evaluated by myself. Patient was alert and oriented and reported that he was only here as he wanted his Foley catheter back in. Patient is  incontinent and normally has a Foley catheter that is changed every 4 weeks. This came out traumatically yesterday. Patient has no bleeding at this time. We discussed that given that he has C. difficile that this would not change the frequency with which she has to get up to go to the bathroom. Patient still preferred placement of Foley. Given the patient had another fall today we did require catheterization to obtain urine to evaluate for the possibility of urinary tract infection which might contribute to his unsteadiness. Foley was replaced. Patient tolerated this well. Also in speaking with the patient's wife it is clear that despite services currently available she was not comfortable with caring for him alone at home. While workup for any possible changes in the patient's medical condition was performed including a head CT based on report of fall from wife as well as chest x-ray based on initial oxygen saturation 87% on room air I discussed the patient with case management. They review the patient's file and did recommend face to face home care for home aide and social work involvement. Patient continues to refuse facility placement and has capacity to do so. This has been discussed with his wife as well as social work. Patient was seen twice by case management. He continues to refuse placement. Patient white count of 14.1. Urinalysis did show signs of infection. Urine culture was sent. Culture from the eighth was reviewed. This showed patient was sensitive to cefuroxime. Per wife patient had not taken any of his antibiotics if she had not filled the since leaving the hospital. He has already been on vancomycin as well as cefuroxime. He was given doses of these in the emergency department. Patient otherwise had complete workup for his leukocytosis including a negative chest x-ray. He remained hemodynamically stable. He was discharged in good condition following completion of his social work and case management  consults. Orders were placed for face-to-face evaluation with home aide and social work. Patient was discharged in good condition.   Cyndra Numbers, MD 09/03/11 1755

## 2011-09-03 NOTE — Progress Notes (Signed)
[facility] HER TO transport patient back home

## 2011-09-03 NOTE — Progress Notes (Signed)
[facility] ED Cm contacted by EDP, Hunt about assistance with disposition for pt.  This is the second time pt has returned to [facility] ED after d/c from [facility] ED on 09/02/11 after 3 pm.  EDP reports wife wanting pt to go to snf.  CM assisted pt with DME on 09/02/11 and had checked with [facility] to confirm pt had HHRN, PT/OT. EDP and cm consulted about hgb, O2 sat Orders for HHSW and Aide ordered Pending further ED test results When Cm spoke with pt he refused d/c to snf Pt able to state year as 2013, his name and that he is in hospital and about to get another test completed Pt initially refused CT because he thought it involved "radiation" Pt states radiation cause bleeding of his bladder and he did not want that to happen again.  Pt informed CM that he is aware that his wife is wanting him "to go to another nursing home but I did not get good care when I was there before.  I could not sleep at night." Discussed with pt his wife concern about taking care of him at home.  Pt informed CM is was aware but he states that he can take care of himself if his home was not so "cluttered" "We bearly have room to eat at my table" Cm discussed with pt concern for his safety and care at home.  Pt again refused to go to snf.  Cm spoke with Baxter Hire of [facility] to update her on pt ED stay so far. Baxter Hire will continue to follow pt

## 2011-09-03 NOTE — ED Notes (Signed)
Per EMS, pt here yesterday for foley replacement, unable to do related to c-diff-patient refused admission for rehab treatment-pt wants foley placed

## 2011-09-03 NOTE — Progress Notes (Signed)
[facility] ED Cm spoke with pt again with wife present in ED Rm# [id number].  CM reviewed new orders for [facility] aide to assist with ADLs and SW  to assist pcp with placement if pt consents/community resources Pt and wife voiced understanding.  Wife is to finish getting pt antibiotic Rxs filled and make appointments with pcp and [facility].  Discussed clarification of home medications.  Encouraged pt to decreased mobility until further evaluated by [facility] PT/OT Pt agreed Pt refused again for snf placement but states he is happy to be d/c home. Wife states she will continue to be primary care giver. All orders updated in [facility] for [facility] and faxed to [phone] to confirm home health agency received new orders Pt had been also followed by [facility] coordinator until 1700 for new orders. CM signing off

## 2011-09-03 NOTE — ED Notes (Signed)
WUJ:[id number]<JY> Expected date:09/03/11<BR> Expected time:<BR> Means of arrival:<BR> Comments:<BR> 78 y/o male C Diff, weakness, foley cath pulled out. PTAR-- 26

## 2011-09-06 ENCOUNTER — Telehealth: Payer: Self-pay

## 2011-09-06 LAB — URINE CULTURE
Colony Count: 15000
Special Requests: NORMAL

## 2011-09-06 MED ORDER — NYSTATIN 100000 UNIT/ML MT SUSP: 500000.0000 [IU] | Freq: Four times a day (QID) | OROMUCOSAL | Status: AC

## 2011-09-06 NOTE — Telephone Encounter (Signed)
[facility] called to inform the patient has thrush in his mouth and is painful to eat.  They are requesting a prescription of mouthwash to be called into [facility].  Also the patient is off plavix and taking asa daily. Please advise on plavix.  They need a verbal on changing his catheter prn. Please advise

## 2011-09-06 NOTE — Telephone Encounter (Signed)
Nystatin done erx  Ok to stay off plavix for now per last d/c summary

## 2011-09-06 NOTE — Telephone Encounter (Signed)
Ok for verbal  Ok for continued same meds for now

## 2011-09-06 NOTE — Telephone Encounter (Signed)
[facility] needs verbal to change catheter prn, please advise

## 2011-09-06 NOTE — Telephone Encounter (Signed)
[facility] informed 

## 2011-09-06 NOTE — Telephone Encounter (Signed)
Ok for verbal 

## 2011-09-06 NOTE — Telephone Encounter (Signed)
[facility] needs verbal on speech therapy.  Also [facility] wanted to informed of interaction between pacerone and citalopram.

## 2011-09-14 ENCOUNTER — Telehealth: Payer: Self-pay | Admitting: Internal Medicine

## 2011-09-14 NOTE — Telephone Encounter (Signed)
If will not eat or drink, needs to go to ER now  If has been taking po this am - ok for labs (780.79)

## 2011-09-14 NOTE — Telephone Encounter (Signed)
Called Amber with [facility] and she informed the patient is taking nothing by mouth today.  The wife stated he is not taking his medications as well.  Informed of MD instructions.

## 2011-09-15 ENCOUNTER — Telehealth: Payer: Self-pay | Admitting: Internal Medicine

## 2011-09-15 NOTE — Telephone Encounter (Signed)
Received lab results from [facility] lab  Aug 27 - hgb 9.7, wbc 4.8, plt normal, cr 1.1  Robin to let pt's wife know - Hgb ok for now, will need f/u cbc with home health or here in 10-14 days (will need future lab)

## 2011-09-15 NOTE — Telephone Encounter (Signed)
Called the patients wife informed of results.  She stated they have appt. With Dr. [name] 09/24/11

## 2011-09-16 ENCOUNTER — Telehealth: Payer: Self-pay

## 2011-09-16 DIAGNOSIS — M6281 Muscle weakness (generalized): Secondary | ICD-10-CM

## 2011-09-16 DIAGNOSIS — I4891 Unspecified atrial fibrillation: Secondary | ICD-10-CM

## 2011-09-16 DIAGNOSIS — R269 Unspecified abnormalities of gait and mobility: Secondary | ICD-10-CM

## 2011-09-16 DIAGNOSIS — I69998 Other sequelae following unspecified cerebrovascular disease: Secondary | ICD-10-CM

## 2011-09-16 NOTE — Telephone Encounter (Signed)
Called left message to call back 

## 2011-09-16 NOTE — Telephone Encounter (Signed)
Chart review this has been an issue since at least aug 27;  If not taking po well or at all, I would refer back to ER as he may have element of dehydration

## 2011-09-16 NOTE — Telephone Encounter (Signed)
Chart review this has been an issue since at least aug 27;  If not taking po well or at all, I would refer back to ER as he may have element of dehydration 

## 2011-09-16 NOTE — Telephone Encounter (Signed)
[facility] to inform pt. Still not eating or  taking medication.  Mentally patient is ok , please advise  [phone]

## 2011-09-17 ENCOUNTER — Telehealth: Payer: Self-pay | Admitting: Internal Medicine

## 2011-09-17 MED ORDER — CITALOPRAM HYDROBROMIDE 20 MG PO TABS: 20.0000 mg | ORAL_TABLET | Freq: Every day | ORAL | Status: DC

## 2011-09-17 NOTE — Telephone Encounter (Signed)
Informed HHRN of MD instructions.  

## 2011-09-17 NOTE — Telephone Encounter (Signed)
Ok to icnrease to 20 mg citalpram  Ok for appt soon

## 2011-09-17 NOTE — Telephone Encounter (Signed)
Caller: Audrey/Spouse; Patient Name: [name]; PCP: Oliver Barre (Adults only); Best Callback Phone Number: [phone].  Patient has not had good appetite for several months, and has lost "a large amount of weight."  States [facility] sees the patient, and they told him the only way for him to get better would be to eat well, but he cannot seem to do that.  Wife wants to know if there is anything that can be given to increase his appetite.  Having trouble getting more than 3 bites or so in him per day.  Not taking his medications well either.  On triage for appetite, he demonstrates signs/symptoms of depression, so triage switched to depression.  Had stroke and complains that "nothing tastes good."   Per depression protocol, advised appointment within 72 hours.  Spouse states has follow up appointment scheduled 09/24/11.  First available appointment out of parameters 09/21/11 1030;  info to office for provider review/callback.   MAY REACH SPOUSE AT [phone].

## 2011-09-21 ENCOUNTER — Telehealth: Payer: Self-pay

## 2011-09-21 ENCOUNTER — Telehealth: Payer: Self-pay | Admitting: *Deleted

## 2011-09-21 NOTE — Telephone Encounter (Signed)
Called the patients wife informed of medication increase.  The patient has appt. On Friday sept. 6, 2013.

## 2011-09-21 NOTE — Telephone Encounter (Signed)
noted 

## 2011-09-21 NOTE — Telephone Encounter (Signed)
Called [facility] informed of ALL MD instructions.  She agreed to call the family and give MD instructions.

## 2011-09-21 NOTE — Telephone Encounter (Signed)
[facility] called to inform the patient was discharged from speech therapy due to non-compliance.

## 2011-09-21 NOTE — Telephone Encounter (Signed)
Left msg on triage stating saw pt today his BP was 86/50. Wanting  to confirm if this BP is normal for the patient. If not requesting that md let her know what how often BP need to be check.Marland KitchenRaechel Chute

## 2011-09-21 NOTE — Telephone Encounter (Signed)
Needs to stop the metoprolol if taking this  I would strongly urge going to ER as dehydration given his recent hx may a factor (I would call EMS for transport)

## 2011-09-21 NOTE — Telephone Encounter (Signed)
[facility] called to inform patients BP today was 78/58 and HR 58.  Please advise call back number for Tresa Endo is [phone]

## 2011-09-21 NOTE — Telephone Encounter (Signed)
His BP should not be less than sbp 90;  See previous note;  To d/c the metoprolol if he is taking, and should go to ER for ? Dehydration and low blood pressure

## 2011-09-22 NOTE — Telephone Encounter (Signed)
[facility] informed of MD instructions.  

## 2011-09-24 ENCOUNTER — Encounter: Payer: Self-pay | Admitting: Internal Medicine

## 2011-09-24 ENCOUNTER — Other Ambulatory Visit (INDEPENDENT_AMBULATORY_CARE_PROVIDER_SITE_OTHER): Payer: Medicare Other

## 2011-09-24 ENCOUNTER — Ambulatory Visit (INDEPENDENT_AMBULATORY_CARE_PROVIDER_SITE_OTHER): Payer: Medicare Other | Admitting: Internal Medicine

## 2011-09-24 VITALS — BP 118/68 | HR 113 | Temp 96.7°F | Resp 18 | Wt 134.5 lb

## 2011-09-24 DIAGNOSIS — E860 Dehydration: Secondary | ICD-10-CM

## 2011-09-24 DIAGNOSIS — R630 Anorexia: Secondary | ICD-10-CM

## 2011-09-24 DIAGNOSIS — R197 Diarrhea, unspecified: Secondary | ICD-10-CM

## 2011-09-24 DIAGNOSIS — R296 Repeated falls: Secondary | ICD-10-CM

## 2011-09-24 DIAGNOSIS — Z9181 History of falling: Secondary | ICD-10-CM

## 2011-09-24 DIAGNOSIS — Z23 Encounter for immunization: Secondary | ICD-10-CM

## 2011-09-24 LAB — CBC WITH DIFFERENTIAL/PLATELET
Basophils Relative: 0.3 % (ref 0.0–3.0)
Eosinophils Relative: 0.5 % (ref 0.0–5.0)
Lymphocytes Relative: 25.6 % (ref 12.0–46.0)
Monocytes Relative: 8.5 % (ref 3.0–12.0)
Neutrophils Relative %: 65.1 % (ref 43.0–77.0)
RBC: 4.2 Mil/uL — ABNORMAL LOW (ref 4.22–5.81)
WBC: 5.1 10*3/uL (ref 4.5–10.5)

## 2011-09-24 LAB — URINALYSIS, ROUTINE W REFLEX MICROSCOPIC
Nitrite: POSITIVE
Total Protein, Urine: 100
pH: 6.5 (ref 5.0–8.0)

## 2011-09-24 MED ORDER — METRONIDAZOLE 500 MG PO TABS: 500.0000 mg | ORAL_TABLET | Freq: Every day | ORAL | Status: AC

## 2011-09-24 MED ORDER — MIRTAZAPINE 15 MG PO TABS: 15.0000 mg | ORAL_TABLET | Freq: Every day | ORAL | Status: DC

## 2011-09-24 MED ORDER — DIPHENOXYLATE-ATROPINE 2.5-0.025 MG PO TABS: 1.0000 | ORAL_TABLET | Freq: Four times a day (QID) | ORAL | Status: AC | PRN

## 2011-09-24 NOTE — Patient Instructions (Addendum)
Please see the [facility] before leaving today - for home IVF's (1 Liter NS per day for 3 days) You had the neosporin and gauze placed for the left elbow skin tear You had the flu shot today Please go to LAB in the Basement for the blood and/or urine tests to be done today You will be contacted by phone if any changes need to be made immediately.  Otherwise, you will receive a letter about your results with an explanation. Take all new medications as prescribed - the mirtazipine 15 mg every night to help with appetite, but also for nerves Your Lomotil was refilled today Please continue the flagyl every day for at least 3 weeks after your current bottle is done OK to stop the lipitor Continue all other medications as before Please keep your appointments with your specialists as you have planned - dental for your dentures not fitting well next week as planned If the Ensure is too sweet, or causes diarrhea, you can try Glucerna OTC to help with calories and dehydration Please return in 2 weeks

## 2011-09-25 ENCOUNTER — Encounter: Payer: Self-pay | Admitting: Internal Medicine

## 2011-09-25 NOTE — Assessment & Plan Note (Addendum)
I suspect related to recent colitis, possibly somewhat affected as well by ill fitting dentures and unwilling to take the ensure;  To cont tx for colitis as above, pursue better dentures as planned by wife, and consider change to glucerna which may be easier for him to tolerate with being less sweet and less chance of diarrhea; also unclear how much depression may be a part, but will add mirtazipine 15 qhs as may help with appetite, possibly for sleep and depression as well

## 2011-09-25 NOTE — Assessment & Plan Note (Signed)
Pt again refuses going to UC or ER, and unlikely to be helped by short stay at local hosp system as getting an appt there takes up to a wk;  Will pursue urgent referral to home health for NS IV  - 1 Liter daily for 3 days consecutive

## 2011-09-25 NOTE — Assessment & Plan Note (Addendum)
With suboptimal ongoing tx for c diff colitis limited due to pt acceptance of tx,  some improved symptomatically per pt, but I suspect will flare again soon if tx stopped; since he will accept once daily we will continue the unusual step of cont'd flagyl daily for 3 wks akin somewhat to the start/stop tapering of flagyl for those with recurrent c diff; for labs today in f/u  Note:  Total time for pt hx, exam, review of record with pt in the room, determination of diagnoses and plan for further eval and tx is > 40 min, with over 50% spent in coordination and counseling of patient and wife

## 2011-09-25 NOTE — Assessment & Plan Note (Signed)
None serious recent, but did have left elbow skin tear yesterday with a stumble into furniture, declines further PT at this time

## 2011-09-25 NOTE — Progress Notes (Signed)
Subjective:    Patient ID: [name], male    DOB: [birth year], 78 y.o.   MRN: [id number]  HPI  Here with supportive wife for f/u, and continues with his obstinancy toward some aspects of recommended tx, including refusing to take more than one dosing flagyl per day, fortunately does seem to be working as he reports gradually improving pain, no fever, less nausea but still occasional diarrhea (no blood);  Has lost another 10 lbs from 146 to 135 today, but afraid to take too much ensure that might cuase more diarrhea.  Has had less than adequate po intake of food and fluids, still lower appetitie, has generalized weakness and dizzy to stand, but has been refusing going to ER for tx.  Walks with walker, no recent falls.  No fever and  Denies urinary symptoms such as dysuria, frequency, urgency,or hematuria.,  No cough, HA, ST and Pt denies chest pain, increased sob or doe, wheezing, orthopnea, PND, increased LE swelling, palpitations, syncope.  Has also trouble with his dentures, as now they dont fit well after ongoing wt loss, wife has appt early next wk for adjustment.  Has been off the metoprolol in the past wk, but not helped very much with his postural symptoms.  Orthostatic BP today - SBP drops from 134 lying, to 118 standing with symptoms.  Did stumble at home and suffered skin tear on furniture to left elbow with small bleeding yesterday, dressed with bandaid and doing ok with no cellulitis. Pt not sure and cannot really answer regarding worsening depression symptoms but is nonsuicidal Past Medical History  Diagnosis Date  . ADENOCARCINOMA, PROSTATE   . ALLERGIC RHINITIS   . B12 DEFICIENCY   . BENIGN PROSTATIC HYPERTROPHY   . CAROTID ARTERY DISEASE   . CEREBROVASCULAR ACCIDENT, HX OF   . CORONARY ARTERY DISEASE   . DIVERTICULOSIS, COLON   . GERD   . HEMORRHOIDS   . HIATAL HERNIA   . HYPERLIPIDEMIA   . HYPERTENSION   . OSTEOPENIA   . PEPTIC ULCER DISEASE   . PERIPHERAL VASCULAR DISEASE    . Personal history of alcoholism   . PROSTATE CANCER, HX OF   . PUD, HX OF   . TRANSIENT ISCHEMIC ATTACK   . Shingles   . Anemia   . AS (aortic stenosis)   . S/P AVR (aortic valve replacement) 09/23/2010  . Complication of anesthesia 09/23/2010    very confused after waking up. Stopped drinking 40 days before this surgery.  . Transfusion reaction, chill fever type 04/28/2011    Fever, rigors, tachycardia,tachypnea but no evidence of hemolysis  . Hemorrhagic cystitis 04/28/2011    Due to previous radiation for PSA recurrence of prostate cancer  . Blood transfusion   . Gross hematuria   . Stroke   . CVA (cerebrovascular accident) 08/29/2011    08/28/11   Past Surgical History  Procedure Date  . Popliteal synovial cyst excision   . Right carotid endarterectomy 1999    Left carotid total chronic occlusion  . Prostate surgery   . Cardic stent 2004  . Aortic valve replacement 09/23/2010    #89mm [facility] Ease pericardial tissue valve  . Coronary artery bypass graft 09/23/2010    CABG x1 using LIMA to LAD  . Cystoscopy with biopsy 02/15/2011    Procedure: CYSTOSCOPY WITH BIOPSY;  Surgeon: Anner Crete, MD;  Location: [facility];  Service: Urology;  Laterality: N/A;  Cystoscopy, biopsy and fulguration of the bladder  reports that he quit smoking about 53 years ago. He has never used smokeless tobacco. He reports that he does not drink alcohol or use illicit drugs. family history includes Colon cancer (age of onset:62) in his mother; Coronary artery disease in his sister; Diabetes in his sister; Hypertension in his sister; Multiple sclerosis in his sister; and Stroke in his brother. Allergies  Allergen Reactions  . Tolterodine Tartrate     Reaction=unknown   Current Outpatient Prescriptions on File Prior to Visit  Medication Sig Dispense Refill  . amiodarone (PACERONE) 200 MG tablet Take 200 mg by mouth 2 (two) times daily.      Marland Kitchen aspirin 81 MG tablet Take 81 mg by mouth daily.      .  citalopram (CELEXA) 20 MG tablet Take 1 tablet (20 mg total) by mouth daily.  90 tablet  3  . famotidine (PEPCID) 40 MG tablet Take 40 mg by mouth daily.      . feeding supplement (ENSURE COMPLETE) LIQD Take 237 mLs by mouth 3 (three) times daily between meals.      . ferrous sulfate 325 (65 FE) MG tablet Take 325 mg by mouth 2 (two) times daily.      . Lactobacillus (ACIDOPHILUS PROBIOTIC) 100 MG CAPS Take 1 capsule by mouth 3 (three) times daily with meals.      Marland Kitchen levothyroxine (SYNTHROID, LEVOTHROID) 25 MCG tablet Take 25 mcg by mouth daily before breakfast.      . mirtazapine (REMERON) 15 MG tablet Take 1 tablet (15 mg total) by mouth at bedtime.  30 tablet  11  . Multiple Vitamin (MULITIVITAMIN WITH MINERALS) TABS Take 1 tablet by mouth daily.      . potassium chloride SA (K-DUR,KLOR-CON) 20 MEQ tablet Take 20 mEq by mouth daily.      Marland Kitchen thiamine 100 MG tablet Take 100 mg by mouth daily.      . Vitamins A & D (VITAMIN A & D) cream Apply 1 application topically daily.       Review of Systems Review of Systems  Constitutional: Negative for diaphoresis and unexpected weight change.  HENT: Negative for drooling and tinnitus.   Eyes: Negative for photophobia and visual disturbance.  Respiratory: Negative for choking and stridor.   Gastrointestinal: Negative for vomiting and blood in stool.  Genitourinary: Negative for hematuria and decreased urine volume.  Musculoskeletal: Negative for gait problem.  Skin: Negative for color change and wound.  Neurological: Negative for tremors and numbness.     Objective:   Physical Exam BP 118/68  Pulse 113  Temp 96.7 F (35.9 C) (Oral)  Resp 18  Wt 134 lb 8 oz (61.009 kg)  SpO2 90% Physical Exam  VS noted,not ill appearing Constitutional: Pt appears well-developed and well-nourished.  HENT: Head: Normocephalic.  Right Ear: External ear normal.  Left Ear: External ear normal.  Eyes: Conjunctivae and EOM are normal. Pupils are equal, round, and  reactive to light.  Neck: Normal range of motion. Neck supple.  Cardiovascular: Normal rate and regular rhythm.   Pulmonary/Chest: Effort normal and breath sounds normal.  Abd:  Soft, NT, non-distended, + BS Neurological: Pt is alert.  Skin: Skin is warm. No erythema.Has small left skin tear without bleeding or cellulitis to left lateral elbow  Psychiatric: Pt behavior is normal. Readily answers questions, judgement is some impaired, not paranoid    Assessment & Plan:

## 2011-09-27 ENCOUNTER — Encounter: Payer: Self-pay | Admitting: Internal Medicine

## 2011-09-27 LAB — HEPATIC FUNCTION PANEL
ALT: 22 U/L (ref 0–53)
Total Protein: 6.5 g/dL (ref 6.0–8.3)

## 2011-09-27 LAB — BASIC METABOLIC PANEL
BUN: 16 mg/dL (ref 6–23)
CO2: 26 mEq/L (ref 19–32)
Chloride: 102 mEq/L (ref 96–112)
Creatinine, Ser: 1.1 mg/dL (ref 0.4–1.5)

## 2011-09-28 ENCOUNTER — Telehealth: Payer: Self-pay

## 2011-09-28 NOTE — Telephone Encounter (Signed)
Already d/w pt taking glucerna

## 2011-09-28 NOTE — Telephone Encounter (Signed)
[employer] went out to visit the patient today for his routine wellness checkup.  They have suggested the patient to be on a nutritional supplement.  Please advise if he would be a candidate for this.

## 2011-09-30 ENCOUNTER — Telehealth: Payer: Self-pay

## 2011-09-30 NOTE — Telephone Encounter (Signed)
HHRN states that spouse reports pt fell this am with large skin tear to the LT elbow, treated with Tegaderm. Tender and swelling to the forehead. HHRN is also requesting verbal order for foley changes. Verbal given per JWJ protocol.

## 2011-10-04 ENCOUNTER — Encounter: Payer: Self-pay | Admitting: Internal Medicine

## 2011-10-07 ENCOUNTER — Telehealth: Payer: Self-pay

## 2011-10-07 NOTE — Telephone Encounter (Signed)
Called the patient informed his wife informed of MD instructions.  The wife stated he would refuse to do so, but that they have an appt. Tomorrow on 10/08/11 at 1:30

## 2011-10-07 NOTE — Telephone Encounter (Signed)
[facility] called to report the patients BP 90/54, weakness, diarrhea, blood in urine and refuses to eat or drink.  Failure to thrive and non-compliant

## 2011-10-07 NOTE — Telephone Encounter (Signed)
Needs to go to ER immediately, to all 911 if he is confused and not able to assist with going

## 2011-10-08 ENCOUNTER — Other Ambulatory Visit (INDEPENDENT_AMBULATORY_CARE_PROVIDER_SITE_OTHER): Payer: Medicare Other

## 2011-10-08 ENCOUNTER — Encounter: Payer: Self-pay | Admitting: Internal Medicine

## 2011-10-08 ENCOUNTER — Ambulatory Visit (INDEPENDENT_AMBULATORY_CARE_PROVIDER_SITE_OTHER): Payer: Medicare Other | Admitting: Internal Medicine

## 2011-10-08 ENCOUNTER — Telehealth: Payer: Self-pay | Admitting: Internal Medicine

## 2011-10-08 VITALS — BP 98/62 | HR 109 | Temp 96.6°F | Ht 71.0 in | Wt 133.0 lb

## 2011-10-08 DIAGNOSIS — E46 Unspecified protein-calorie malnutrition: Secondary | ICD-10-CM

## 2011-10-08 DIAGNOSIS — R630 Anorexia: Secondary | ICD-10-CM

## 2011-10-08 DIAGNOSIS — R310 Gross hematuria: Secondary | ICD-10-CM

## 2011-10-08 DIAGNOSIS — D62 Acute posthemorrhagic anemia: Secondary | ICD-10-CM

## 2011-10-08 DIAGNOSIS — A0472 Enterocolitis due to Clostridium difficile, not specified as recurrent: Secondary | ICD-10-CM

## 2011-10-08 LAB — CBC WITH DIFFERENTIAL/PLATELET
Basophils Absolute: 0 10*3/uL (ref 0.0–0.1)
Eosinophils Relative: 0.2 % (ref 0.0–5.0)
Lymphocytes Relative: 10.7 % — ABNORMAL LOW (ref 12.0–46.0)
Monocytes Relative: 6 % (ref 3.0–12.0)
Neutrophils Relative %: 83 % — ABNORMAL HIGH (ref 43.0–77.0)
Platelets: 251 10*3/uL (ref 150.0–400.0)
RDW: 20.6 % — ABNORMAL HIGH (ref 11.5–14.6)
WBC: 8.1 10*3/uL (ref 4.5–10.5)

## 2011-10-08 LAB — URINALYSIS, ROUTINE W REFLEX MICROSCOPIC
Ketones, ur: 15
Specific Gravity, Urine: <=1.005 (ref 1.000–1.030)
Urine Glucose: NEGATIVE
Urobilinogen, UA: 1 (ref 0.0–1.0)
pH: >=9 (ref 5.0–8.0)

## 2011-10-08 LAB — BASIC METABOLIC PANEL
CO2: 25 mEq/L (ref 19–32)
Calcium: 8.7 mg/dL (ref 8.4–10.5)
Chloride: 106 mEq/L (ref 96–112)
Glucose, Bld: 78 mg/dL (ref 70–99)
Potassium: 5.6 mEq/L — ABNORMAL HIGH (ref 3.5–5.1)
Sodium: 139 mEq/L (ref 135–145)

## 2011-10-08 MED ORDER — MIRTAZAPINE 30 MG PO TABS: 30.0000 mg | ORAL_TABLET | Freq: Every day | ORAL | Status: DC

## 2011-10-08 NOTE — Assessment & Plan Note (Addendum)
Recurrent, with need for transfusion in past;  For cbc/urine studies  Note:  Total time for pt hx, exam, review of record with pt in the room, determination of diagnoses and plan for further eval and tx is > 40 min, with over 50% spent in coordination and counseling of patient

## 2011-10-08 NOTE — Progress Notes (Signed)
Subjective:    Patient ID: [name], male    DOB: 06/30/1926, 78 y.o.   MRN: [id number]  HPI  Here for f/u with wife who remains supportive but frustrated with pt not cooperating;  After last visit Pt Looked better, then worse again after IVF's, BP remains relatively low, but pt denies orthostasis, though appetite remains poor, has lost more wt.     Pt has No gross hematuria for 6 wks, now re-started for past 10 days, didn't finish the wound clinic treatments as recommended.    Left elbow skin tear - improved  With good healing today   mirtazipine - took one pill and slept all the next day - too sedating at the 15 mg, hard to take pills in the evening as well, needs to take all pills in the am per wife as he wont accept later in theday  Diarrhea - now with loose stools (not watery), abd pain now worse; appetite still low, wt down another 2 lbs; no blood in stool (had one dark stool per wife), no vomiting, still refuses more than once daily flagyl.  Did see dental, given temporary filler , but unfort dropped his dentures and broke, now being fixed per wife, po intake poor  No choke, gag, but has some cough occas with eating Past Medical History  Diagnosis Date  . ADENOCARCINOMA, PROSTATE   . ALLERGIC RHINITIS   . B12 DEFICIENCY   . BENIGN PROSTATIC HYPERTROPHY   . CAROTID ARTERY DISEASE   . CEREBROVASCULAR ACCIDENT, HX OF   . CORONARY ARTERY DISEASE   . DIVERTICULOSIS, COLON   . GERD   . HEMORRHOIDS   . HIATAL HERNIA   . HYPERLIPIDEMIA   . HYPERTENSION   . OSTEOPENIA   . PEPTIC ULCER DISEASE   . PERIPHERAL VASCULAR DISEASE   . Personal history of alcoholism   . PROSTATE CANCER, HX OF   . PUD, HX OF   . TRANSIENT ISCHEMIC ATTACK   . Shingles   . Anemia   . AS (aortic stenosis)   . S/P AVR (aortic valve replacement) 09/23/2010  . Complication of anesthesia 09/23/2010    very confused after waking up. Stopped drinking 40 days before this surgery.  . Transfusion reaction,  chill fever type 04/28/2011    Fever, rigors, tachycardia,tachypnea but no evidence of hemolysis  . Hemorrhagic cystitis 04/28/2011    Due to previous radiation for PSA recurrence of prostate cancer  . Blood transfusion   . Gross hematuria   . Stroke   . CVA (cerebrovascular accident) 08/29/2011    08/28/11   Past Surgical History  Procedure Date  . Popliteal synovial cyst excision   . Right carotid endarterectomy 1999    Left carotid total chronic occlusion  . Prostate surgery   . Cardic stent 2004  . Aortic valve replacement 09/23/2010    #64mm [facility] Ease pericardial tissue valve  . Coronary artery bypass graft 09/23/2010    CABG x1 using LIMA to LAD  . Cystoscopy with biopsy 02/15/2011    Procedure: CYSTOSCOPY WITH BIOPSY;  Surgeon: Anner Crete, MD;  Location: [facility];  Service: Urology;  Laterality: N/A;  Cystoscopy, biopsy and fulguration of the bladder    reports that he quit smoking about 53 years ago. He has never used smokeless tobacco. He reports that he does not drink alcohol or use illicit drugs. family history includes Colon cancer (age of onset:62) in his mother; Coronary artery disease in his sister;  Diabetes in his sister; Hypertension in his sister; Multiple sclerosis in his sister; and Stroke in his brother. Allergies  Allergen Reactions  . Tolterodine Tartrate     Reaction=unknown   Current Outpatient Prescriptions on File Prior to Visit  Medication Sig Dispense Refill  . aspirin 81 MG tablet Take 81 mg by mouth daily.      . citalopram (CELEXA) 20 MG tablet Take 1 tablet (20 mg total) by mouth daily.  90 tablet  3  . Lactobacillus (ACIDOPHILUS PROBIOTIC) 100 MG CAPS Take 1 capsule by mouth 3 (three) times daily with meals.      Marland Kitchen levothyroxine (SYNTHROID, LEVOTHROID) 25 MCG tablet Take 25 mcg by mouth daily before breakfast.      . potassium chloride SA (K-DUR,KLOR-CON) 20 MEQ tablet Take 20 mEq by mouth daily.      Marland Kitchen thiamine 100 MG tablet Take 100 mg by  mouth daily.      . Vitamins A & D (VITAMIN A & D) cream Apply 1 application topically daily.      Marland Kitchen amiodarone (PACERONE) 200 MG tablet Take 200 mg by mouth 2 (two) times daily.      . famotidine (PEPCID) 40 MG tablet Take 40 mg by mouth daily.      . feeding supplement (ENSURE COMPLETE) LIQD Take 237 mLs by mouth 3 (three) times daily between meals.      . ferrous sulfate 325 (65 FE) MG tablet Take 325 mg by mouth 2 (two) times daily.      . Multiple Vitamin (MULITIVITAMIN WITH MINERALS) TABS Take 1 tablet by mouth daily.        Review of Systems  Constitutional: Negative for diaphoresis HENT: Negative for tinnitus.   Eyes: Negative for photophobia and visual disturbance.  Respiratory: Negative for choking and stridor.   Gastrointestinal: Negative for vomiting and blood in stool.  Musculoskeletal: Negative for gait problem.  Skin: Negative for color change and wound.  Neurological: Negative for tremors and numbness.  Psychiatric/Behavioral: Negative for decreased concentration. The patient is not hyperactive.      Objective:   Physical Exam BP 98/62  Pulse 109  Temp 96.6 F (35.9 C) (Oral)  Ht 5\' 11"  (1.803 m)  Wt 133 lb (60.328 kg)  BMI 18.55 kg/m2  SpO2 95% Physical Exam  VS noted, appears thinner and weaker.  HENT: Head: Normocephalic.  Right Ear: External ear normal.  Left Ear: External ear normal.  Eyes: Conjunctivae and EOM are normal. Pupils are equal, round, and reactive to light.  Neck: Normal range of motion. Neck supple.  Cardiovascular: Normal rate and regular rhythm.   Pulmonary/Chest: Effort normal and breath sounds normal.  Abd:  Soft, NT, non-distended, + BS Neurological: Pt is alert. Not confused  Skin: Skin is warm. No erythema. No LE edema     Assessment & Plan:

## 2011-10-08 NOTE — Telephone Encounter (Signed)
Left a message at [phone] extension to call me.

## 2011-10-08 NOTE — Patient Instructions (Addendum)
Ok to increase the mirtazipine to 30 mg per day Please work on the dentures as you are doing Please go to LAB in the Basement for the blood and/or urine tests to be done today You will be contacted by phone if any changes need to be made immediately.  Otherwise, you will receive a letter about your results with an explanation. Please remember to sign up for My Chart at your earliest convenience, as this will be important to you in the future with finding out test results. You will be contacted regarding the referral for: Gastroenterology We can consider hospice referral if needed after GI consult

## 2011-10-09 ENCOUNTER — Encounter: Payer: Self-pay | Admitting: Internal Medicine

## 2011-10-09 DIAGNOSIS — A0472 Enterocolitis due to Clostridium difficile, not specified as recurrent: Secondary | ICD-10-CM | POA: Insufficient documentation

## 2011-10-09 NOTE — Assessment & Plan Note (Signed)
For increased mirtaz 30 qd higher strength should lead to less antihist effect, less sedation, to take in the AM with other meds, also for GI referral - ? For PEG tube - has dementia but no malignancy or other terminal problem;  If not a candidate would likely need hospice referral

## 2011-10-09 NOTE — Assessment & Plan Note (Signed)
With re-starting of the gross hematuria x 10 days - for cbc f/u

## 2011-10-09 NOTE — Assessment & Plan Note (Signed)
To cont at least daily flagyl use for now,  to f/u any worsening symptoms or concerns

## 2011-10-10 LAB — URINE CULTURE: Colony Count: >=100000

## 2011-10-11 ENCOUNTER — Telehealth: Payer: Self-pay

## 2011-10-11 ENCOUNTER — Encounter: Payer: Self-pay | Admitting: *Deleted

## 2011-10-11 NOTE — Telephone Encounter (Signed)
Message copied by Pincus Sanes on Mon Oct 11, 2011 10:33 AM ------      Message from: Corwin Levins      Created: Sun Oct 10, 2011  1:25 PM      Regarding: ? uti       Urine cx not conclusive;  Has pt had any fever over the weekend?

## 2011-10-11 NOTE — Telephone Encounter (Signed)
Noted, no new recommendations at this time

## 2011-10-11 NOTE — Telephone Encounter (Signed)
Called the patient and his wife stated they do not think he has had any fever, BUT the patient states he has been hurting all over.  The wife stated the patient has had 4 BM since midnight last night.

## 2011-10-11 NOTE — Telephone Encounter (Signed)
Pt is scheduled to see Dr. Juanda Chance tomorrow. Wife states that the pt is not eating or drinking, very weak. Wife wants to know if pts are ever transported by ambulance to the office. Let wife know that sometimes they are, she is going to try to bring him with the help of her sister. Asked if she thought he was dehydrated, she states she thinks he is. Asked if pt is urinating and she states he has a catheter. There is dark bloody urine present per the wife. 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 Pt has recently woke up and asked her for some pepsi. Let wife know that she should take him to the ER if he gets worse and needs to be seen prior to the office visit.

## 2011-10-12 ENCOUNTER — Other Ambulatory Visit (INDEPENDENT_AMBULATORY_CARE_PROVIDER_SITE_OTHER): Payer: Medicare Other

## 2011-10-12 ENCOUNTER — Encounter: Payer: Self-pay | Admitting: Internal Medicine

## 2011-10-12 ENCOUNTER — Ambulatory Visit (INDEPENDENT_AMBULATORY_CARE_PROVIDER_SITE_OTHER): Payer: Medicare Other | Admitting: Internal Medicine

## 2011-10-12 ENCOUNTER — Telehealth: Payer: Self-pay | Admitting: Internal Medicine

## 2011-10-12 VITALS — BP 140/110 | HR 52 | Ht 65.0 in | Wt 132.0 lb

## 2011-10-12 DIAGNOSIS — A0472 Enterocolitis due to Clostridium difficile, not specified as recurrent: Secondary | ICD-10-CM

## 2011-10-12 DIAGNOSIS — R633 Feeding difficulties: Secondary | ICD-10-CM

## 2011-10-12 DIAGNOSIS — R6251 Failure to thrive (child): Secondary | ICD-10-CM

## 2011-10-12 DIAGNOSIS — R627 Adult failure to thrive: Secondary | ICD-10-CM

## 2011-10-12 DIAGNOSIS — R195 Other fecal abnormalities: Secondary | ICD-10-CM

## 2011-10-12 DIAGNOSIS — Z79899 Other long term (current) drug therapy: Secondary | ICD-10-CM

## 2011-10-12 LAB — COMPREHENSIVE METABOLIC PANEL
BUN: 31 mg/dL — ABNORMAL HIGH (ref 6–23)
CO2: 23 mEq/L (ref 19–32)
Creatinine, Ser: 1.2 mg/dL (ref 0.4–1.5)
GFR: 64.45 mL/min (ref >60.00)
Glucose, Bld: 115 mg/dL — ABNORMAL HIGH (ref 70–99)
Total Bilirubin: 0.8 mg/dL (ref 0.3–1.2)

## 2011-10-12 LAB — IBC PANEL
Iron: 19 ug/dL — ABNORMAL LOW (ref 42–165)
Saturation Ratios: 16.9 % — ABNORMAL LOW (ref 20.0–50.0)
Transferrin: 80.1 mg/dL — ABNORMAL LOW (ref 212.0–360.0)

## 2011-10-12 LAB — PREALBUMIN: Prealbumin: 6.3 mg/dL — ABNORMAL LOW (ref 17.0–34.0)

## 2011-10-12 MED ORDER — MEGESTROL ACETATE 40 MG/ML PO SUSP: 200.0000 mg | Freq: Every day | ORAL | Status: DC

## 2011-10-12 NOTE — Telephone Encounter (Signed)
Dr Juanda Chance, please advise. Thanks.

## 2011-10-12 NOTE — Telephone Encounter (Signed)
Pt decided he wants  to go ahead with gastrostomy, but his labs today show severe dehydration , K+ 5.8, , Na+ 150. He needs to go to ED for hydration and admitted for placement of PEG. I will talk to Dr [name], his PCP about it so we can coordinate it. He ought to be admitted  To hospitalist service and get a GI consult for placement of gastrostomy.please talk to me about it tomorrow.

## 2011-10-12 NOTE — Progress Notes (Signed)
[name] [birth year] MRN [id number]   History of Present Illness:  This is an 78 year old white male with failure to thrive and massive weight loss from 183 pounds in September 2012 to 146 pounds last month and currently to 132 pounds. His appetite is very poor and he refuses to take his medications.. He complains of  bad taste in his mouth. He has a history of atrial fibrillation and is post aortic valve replacement in September 2012. He has a hIstory of Coronary artery disease and a recent stroke. He was hospitalized in August 2013 with C. difficile colitis and responded to Flagyl. He has continued to lose weight. He has a history of prostate cancer, and is status post radiation in 2006. He has an indwelling Foley catheter. Because of weight loss, his dentures don't fit and he has trouble chewing foods. His wife is very frustrated with him because he refuses to eat. He is having loose stools.  His last colonoscopy was in 2010. He has a family history of colon cancer in his mother and a personal history of colon polyps. He had internal hemorrhoids on that exam. His last hemoglobin was 11.9 with a hematocrit of 37.2. On September 20, BUN was elevated at 24 and creatinine was up to 1.2.   Past Medical History  Diagnosis Date  . ADENOCARCINOMA, PROSTATE   . ALLERGIC RHINITIS   . B12 DEFICIENCY   . BENIGN PROSTATIC HYPERTROPHY   . CAROTID ARTERY DISEASE   . CORONARY ARTERY DISEASE   . DIVERTICULOSIS, COLON   . GERD   . HEMORRHOIDS   . HIATAL HERNIA   . HYPERLIPIDEMIA   . HYPERTENSION   . OSTEOPENIA   . PEPTIC ULCER DISEASE   . PERIPHERAL VASCULAR DISEASE   . Personal history of alcoholism   . TRANSIENT ISCHEMIC ATTACK   . Shingles   . Anemia   . AS (aortic stenosis)   . S/P AVR (aortic valve replacement) 09/23/2010  . Complication of anesthesia 09/23/2010    very confused after waking up. Stopped drinking 40 days before this surgery.  . Transfusion reaction, chill fever type 04/28/2011      Fever, rigors, tachycardia,tachypnea but no evidence of hemolysis  . Hemorrhagic cystitis 04/28/2011    Due to previous radiation for PSA recurrence of prostate cancer  . Blood transfusion   . Gross hematuria   . CVA (cerebrovascular accident) 08/29/2011    08/28/11  . C. difficile colitis    Past Surgical History  Procedure Date  . Popliteal synovial cyst excision 1998  . Carotid endarterectomy 1999    right, Left carotid total chronic occlusion  . Prostate surgery 2001  . Coronary angioplasty with stent placement 2004  . Aortic valve replacement 09/23/2010    #40mm [facility] Ease pericardial tissue valve  . Coronary artery bypass graft 09/23/2010    CABG x1 using LIMA to LAD  . Cystoscopy with biopsy 02/15/2011    Procedure: CYSTOSCOPY WITH BIOPSY;  Surgeon: Anner Crete, MD;  Location: [facility];  Service: Urology;  Laterality: N/A;  Cystoscopy, biopsy and fulguration of the bladder    reports that he quit smoking about 53 years ago. He has never used smokeless tobacco. He reports that he does not drink alcohol or use illicit drugs. family history includes Colon cancer (age of onset:62) in his mother; Coronary artery disease in his sister; Diabetes in his sister; Hypertension in his sister; Multiple sclerosis in his sister; and Stroke in his brother. Allergies  Allergen Reactions  . Tolterodine Tartrate     Reaction=unknown        Review of Systems: Peeled dysphagia. Swallowing evaluation while in the hospital showed high risk for aspiration  The remainder of the 10 point ROS is negative except as outlined in H&P   Physical Exam: General appearance  cachectic. Temporal wasting, in no distress. Eyes- non icteric. Sunken eyes dry mouth HEENT nontraumatic, normocephalic., Dentures don't fit Mouth no lesions, tongue papillated, no cheilosis. Neck supple without adenopathy, thyroid not enlarged, no carotid bruits, no JVD. Lungs Clear to auscultation bilaterally. Cor normal  S1, normal S2, prosthetic sounds, no murmur,  quiet precordium. Abdomen: Scaphoid soft and nontender with normoactive bowel sounds. Rectal: External hemorrhoids. Soft Hemoccult positive stool. Extremities 1+ edema, Travels in a wheelchair. Difficult to get on examining table with assistance. Skin no lesions. Multiple ecchymoses.healing sacral pressure ulcers Neurological alert and oriented x 3. Psychological normal mood and affect.  Assessment and Plan:  Problem #1 Failure to thrive one year after aortic replacement surgery and recent C. difficile colitis. Patient continues to lose weight. He has lost a total of about 50 pounds. There are multiple reasons for it. He has  dry mouth. Flagyl 500 mg may cause the bad taste.and stomatitis. Marland Kitchen He also appears to be somewhat dehydrated. He would be a good candidate for gastrostomy tube feedings at least on a temporary basis for him to be able to gain 10-20 pounds. He is reluctant to go through that at this point, although his wife is for it. Gastrostomy would also give [place] an option to administer his medications which he refuses. We have negotiated with him that we would give him  2 weeks to turn things around and seeing him again  In 2 weeks.. We will start him on Megace 200 mg daily. He was on it in the past. He is not taking his Remeron because it makes him sleepy, according to the wife. We are checking his prealbumin today, B12 and iron studies. He agrees that if his weight drops to 126 pounds that he will let us place the gastrostomy.  Problem #2 Recent C. difficile colitis has resolved. I would still keep him on Flagyl 250 mg daily until I see him next week. His heme positive stool is likely from radiation proctitis. He is up-to-date on his colonoscopy.   10/12/2011 [name]

## 2011-10-12 NOTE — Patient Instructions (Addendum)
We have sent the following medications to your pharmacy for you to pick up at your convenience: Megace   Please split Flagyl 500 mg tablet that you have in half and take once daily(250 mg)  Your physician has requested that you go to the basement for the following lab work before leaving today: Prealbumin, CMET, IBC, and B12  Please follow up in 2 weeks with Dr. Juanda Chance. You have been scheduled for 10-26-2011 at 2pm   CC: Oliver Barre, MD

## 2011-10-13 ENCOUNTER — Inpatient Hospital Stay (HOSPITAL_COMMUNITY)
Admission: EM | Admit: 2011-10-13 | Discharge: 2011-10-20 | DRG: 640 | Disposition: A | Discharge status: Home Health | Payer: Medicare Other | Attending: Internal Medicine | Admitting: Internal Medicine

## 2011-10-13 ENCOUNTER — Emergency Department (HOSPITAL_COMMUNITY): Payer: Medicare Other

## 2011-10-13 ENCOUNTER — Encounter (HOSPITAL_COMMUNITY): Payer: Self-pay

## 2011-10-13 DIAGNOSIS — Z8673 Personal history of transient ischemic attack (TIA), and cerebral infarction without residual deficits: Secondary | ICD-10-CM

## 2011-10-13 DIAGNOSIS — B3781 Candidal esophagitis: Secondary | ICD-10-CM

## 2011-10-13 DIAGNOSIS — N3090 Cystitis, unspecified without hematuria: Secondary | ICD-10-CM | POA: Diagnosis present

## 2011-10-13 DIAGNOSIS — Z7982 Long term (current) use of aspirin: Secondary | ICD-10-CM

## 2011-10-13 DIAGNOSIS — G459 Transient cerebral ischemic attack, unspecified: Secondary | ICD-10-CM

## 2011-10-13 DIAGNOSIS — E43 Unspecified severe protein-calorie malnutrition: Secondary | ICD-10-CM

## 2011-10-13 DIAGNOSIS — R630 Anorexia: Secondary | ICD-10-CM

## 2011-10-13 DIAGNOSIS — I10 Essential (primary) hypertension: Secondary | ICD-10-CM

## 2011-10-13 DIAGNOSIS — I2510 Atherosclerotic heart disease of native coronary artery without angina pectoris: Secondary | ICD-10-CM

## 2011-10-13 DIAGNOSIS — Z952 Presence of prosthetic heart valve: Secondary | ICD-10-CM

## 2011-10-13 DIAGNOSIS — E870 Hyperosmolality and hypernatremia: Principal | ICD-10-CM

## 2011-10-13 DIAGNOSIS — Z954 Presence of other heart-valve replacement: Secondary | ICD-10-CM

## 2011-10-13 DIAGNOSIS — E876 Hypokalemia: Secondary | ICD-10-CM

## 2011-10-13 DIAGNOSIS — E039 Hypothyroidism, unspecified: Secondary | ICD-10-CM

## 2011-10-13 DIAGNOSIS — Z79899 Other long term (current) drug therapy: Secondary | ICD-10-CM

## 2011-10-13 DIAGNOSIS — I4891 Unspecified atrial fibrillation: Secondary | ICD-10-CM

## 2011-10-13 DIAGNOSIS — F1021 Alcohol dependence, in remission: Secondary | ICD-10-CM

## 2011-10-13 DIAGNOSIS — D649 Anemia, unspecified: Secondary | ICD-10-CM

## 2011-10-13 DIAGNOSIS — E871 Hypo-osmolality and hyponatremia: Secondary | ICD-10-CM

## 2011-10-13 DIAGNOSIS — R296 Repeated falls: Secondary | ICD-10-CM

## 2011-10-13 DIAGNOSIS — R1310 Dysphagia, unspecified: Secondary | ICD-10-CM

## 2011-10-13 DIAGNOSIS — R310 Gross hematuria: Secondary | ICD-10-CM

## 2011-10-13 DIAGNOSIS — B9620 Unspecified Escherichia coli [E. coli] as the cause of diseases classified elsewhere: Secondary | ICD-10-CM

## 2011-10-13 DIAGNOSIS — C61 Malignant neoplasm of prostate: Secondary | ICD-10-CM

## 2011-10-13 DIAGNOSIS — Z8679 Personal history of other diseases of the circulatory system: Secondary | ICD-10-CM

## 2011-10-13 DIAGNOSIS — Z87891 Personal history of nicotine dependence: Secondary | ICD-10-CM

## 2011-10-13 DIAGNOSIS — A0472 Enterocolitis due to Clostridium difficile, not specified as recurrent: Secondary | ICD-10-CM

## 2011-10-13 DIAGNOSIS — N3091 Cystitis, unspecified with hematuria: Secondary | ICD-10-CM

## 2011-10-13 DIAGNOSIS — R634 Abnormal weight loss: Secondary | ICD-10-CM | POA: Diagnosis present

## 2011-10-13 DIAGNOSIS — Z8546 Personal history of malignant neoplasm of prostate: Secondary | ICD-10-CM

## 2011-10-13 DIAGNOSIS — K5730 Diverticulosis of large intestine without perforation or abscess without bleeding: Secondary | ICD-10-CM

## 2011-10-13 DIAGNOSIS — K529 Noninfective gastroenteritis and colitis, unspecified: Secondary | ICD-10-CM

## 2011-10-13 DIAGNOSIS — B029 Zoster without complications: Secondary | ICD-10-CM

## 2011-10-13 DIAGNOSIS — R633 Feeding difficulties, unspecified: Secondary | ICD-10-CM

## 2011-10-13 DIAGNOSIS — I639 Cerebral infarction, unspecified: Secondary | ICD-10-CM

## 2011-10-13 DIAGNOSIS — E860 Dehydration: Secondary | ICD-10-CM

## 2011-10-13 DIAGNOSIS — D62 Acute posthemorrhagic anemia: Secondary | ICD-10-CM

## 2011-10-13 DIAGNOSIS — K449 Diaphragmatic hernia without obstruction or gangrene: Secondary | ICD-10-CM | POA: Diagnosis present

## 2011-10-13 DIAGNOSIS — K5710 Diverticulosis of small intestine without perforation or abscess without bleeding: Secondary | ICD-10-CM | POA: Diagnosis present

## 2011-10-13 DIAGNOSIS — Z0000 Encounter for general adult medical examination without abnormal findings: Secondary | ICD-10-CM

## 2011-10-13 DIAGNOSIS — K279 Peptic ulcer, site unspecified, unspecified as acute or chronic, without hemorrhage or perforation: Secondary | ICD-10-CM

## 2011-10-13 DIAGNOSIS — R197 Diarrhea, unspecified: Secondary | ICD-10-CM

## 2011-10-13 DIAGNOSIS — K219 Gastro-esophageal reflux disease without esophagitis: Secondary | ICD-10-CM

## 2011-10-13 DIAGNOSIS — S51019A Laceration without foreign body of unspecified elbow, initial encounter: Secondary | ICD-10-CM

## 2011-10-13 DIAGNOSIS — K651 Peritoneal abscess: Secondary | ICD-10-CM

## 2011-10-13 DIAGNOSIS — I739 Peripheral vascular disease, unspecified: Secondary | ICD-10-CM

## 2011-10-13 DIAGNOSIS — E785 Hyperlipidemia, unspecified: Secondary | ICD-10-CM

## 2011-10-13 DIAGNOSIS — IMO0002 Reserved for concepts with insufficient information to code with codable children: Secondary | ICD-10-CM

## 2011-10-13 DIAGNOSIS — E538 Deficiency of other specified B group vitamins: Secondary | ICD-10-CM | POA: Diagnosis present

## 2011-10-13 DIAGNOSIS — R627 Adult failure to thrive: Secondary | ICD-10-CM | POA: Diagnosis present

## 2011-10-13 DIAGNOSIS — N179 Acute kidney failure, unspecified: Secondary | ICD-10-CM

## 2011-10-13 DIAGNOSIS — M899 Disorder of bone, unspecified: Secondary | ICD-10-CM

## 2011-10-13 DIAGNOSIS — D72829 Elevated white blood cell count, unspecified: Secondary | ICD-10-CM

## 2011-10-13 LAB — URINALYSIS, ROUTINE W REFLEX MICROSCOPIC
Glucose, UA: NEGATIVE mg/dL
Ketones, ur: 40 mg/dL — AB
Protein, ur: >300 mg/dL — AB

## 2011-10-13 LAB — CBC WITH DIFFERENTIAL/PLATELET
Basophils Absolute: 0 10*3/uL (ref 0.0–0.1)
Eosinophils Relative: 0 % (ref 0–5)
HCT: 33.3 % — ABNORMAL LOW (ref 39.0–52.0)
Lymphocytes Relative: 9 % — ABNORMAL LOW (ref 12–46)
MCHC: 33 g/dL (ref 30.0–36.0)
MCV: 87.6 fL (ref 78.0–100.0)
Monocytes Absolute: 0.5 10*3/uL (ref 0.1–1.0)
Monocytes Relative: 4 % (ref 3–12)
RDW: 18.5 % — ABNORMAL HIGH (ref 11.5–15.5)
WBC: 11.2 10*3/uL — ABNORMAL HIGH (ref 4.0–10.5)

## 2011-10-13 LAB — POCT I-STAT, CHEM 8
BUN: 33 mg/dL — ABNORMAL HIGH (ref 6–23)
Calcium, Ion: 1.12 mmol/L — ABNORMAL LOW (ref 1.13–1.30)
Creatinine, Ser: 1 mg/dL (ref 0.50–1.35)
Glucose, Bld: 76 mg/dL (ref 70–99)
TCO2: 24 mmol/L (ref 0–100)

## 2011-10-13 LAB — COMPREHENSIVE METABOLIC PANEL
ALT: 13 U/L (ref 0–53)
AST: 24 U/L (ref 0–37)
CO2: 23 mEq/L (ref 19–32)
Chloride: 111 mEq/L (ref 96–112)
GFR calc non Af Amer: 76 mL/min — ABNORMAL LOW (ref >90)
Sodium: 144 mEq/L (ref 135–145)
Total Bilirubin: 0.3 mg/dL (ref 0.3–1.2)

## 2011-10-13 LAB — URINE MICROSCOPIC-ADD ON

## 2011-10-13 MED ORDER — ONDANSETRON HCL 4 MG/2ML IJ SOLN
4.0000 mg | Freq: Once | INTRAMUSCULAR | Status: AC
  Administered 2011-10-13: 4 mg via INTRAVENOUS
  Filled 2011-10-13: qty 2

## 2011-10-13 MED ORDER — ONDANSETRON HCL 4 MG PO TABS: 4.0000 mg | ORAL_TABLET | Freq: Four times a day (QID) | ORAL | Status: DC | PRN

## 2011-10-13 MED ORDER — ONDANSETRON HCL 4 MG/2ML IJ SOLN: 4.0000 mg | Freq: Four times a day (QID) | INTRAMUSCULAR | Status: DC | PRN

## 2011-10-13 MED ORDER — ACETAMINOPHEN 650 MG RE SUPP: 650.0000 mg | Freq: Four times a day (QID) | RECTAL | Status: DC | PRN

## 2011-10-13 MED ORDER — LEVOTHYROXINE SODIUM 25 MCG PO TABS
25.0000 ug | ORAL_TABLET | Freq: Every day | ORAL | Status: DC
  Administered 2011-10-15 – 2011-10-20 (×6): 25 ug via ORAL
  Filled 2011-10-13 (×9): qty 1

## 2011-10-13 MED ORDER — MEGESTROL ACETATE 40 MG/ML PO SUSP
200.0000 mg | Freq: Every day | ORAL | Status: DC
  Administered 2011-10-13: 200 mg via ORAL
  Filled 2011-10-13 (×4): qty 5

## 2011-10-13 MED ORDER — POTASSIUM CHLORIDE CRYS ER 20 MEQ PO TBCR
20.0000 meq | EXTENDED_RELEASE_TABLET | Freq: Every day | ORAL | Status: DC
  Administered 2011-10-13 – 2011-10-16 (×3): 20 meq via ORAL
  Filled 2011-10-13 (×5): qty 1

## 2011-10-13 MED ORDER — MIRTAZAPINE 30 MG PO TABS
30.0000 mg | ORAL_TABLET | Freq: Every day | ORAL | Status: DC
  Administered 2011-10-13 – 2011-10-20 (×7): 30 mg via ORAL
  Filled 2011-10-13 (×8): qty 1

## 2011-10-13 MED ORDER — METRONIDAZOLE 250 MG PO TABS
250.0000 mg | ORAL_TABLET | Freq: Three times a day (TID) | ORAL | Status: DC
  Administered 2011-10-13 – 2011-10-15 (×5): 250 mg via ORAL
  Filled 2011-10-13 (×10): qty 1

## 2011-10-13 MED ORDER — FERROUS SULFATE 325 (65 FE) MG PO TABS
325.0000 mg | ORAL_TABLET | Freq: Every day | ORAL | Status: DC
  Administered 2011-10-16 – 2011-10-20 (×5): 325 mg via ORAL
  Filled 2011-10-13 (×9): qty 1

## 2011-10-13 MED ORDER — PANTOPRAZOLE SODIUM 40 MG PO TBEC
40.0000 mg | DELAYED_RELEASE_TABLET | Freq: Every day | ORAL | Status: DC
  Filled 2011-10-13 (×2): qty 1

## 2011-10-13 MED ORDER — SODIUM CHLORIDE 0.9 % IV SOLN
INTRAVENOUS | Status: DC
  Administered 2011-10-13 – 2011-10-14 (×3): via INTRAVENOUS
  Administered 2011-10-15: 500 mL via INTRAVENOUS

## 2011-10-13 MED ORDER — ASPIRIN 81 MG PO CHEW
81.0000 mg | CHEWABLE_TABLET | Freq: Every day | ORAL | Status: DC
  Administered 2011-10-13: 81 mg via ORAL
  Filled 2011-10-13 (×2): qty 1

## 2011-10-13 MED ORDER — VITAMIN B-1 100 MG PO TABS
100.0000 mg | ORAL_TABLET | Freq: Every day | ORAL | Status: DC
  Administered 2011-10-13 – 2011-10-20 (×7): 100 mg via ORAL
  Filled 2011-10-13 (×8): qty 1

## 2011-10-13 MED ORDER — DIPHENOXYLATE-ATROPINE 2.5-0.025 MG PO TABS
1.0000 | ORAL_TABLET | Freq: Four times a day (QID) | ORAL | Status: DC | PRN
  Administered 2011-10-15 – 2011-10-19 (×2): 1 via ORAL
  Filled 2011-10-13 (×2): qty 1

## 2011-10-13 MED ORDER — SODIUM CHLORIDE 0.9 % IV SOLN: 1000.0000 mL | INTRAVENOUS | Status: DC

## 2011-10-13 MED ORDER — ACIDOPHILUS PROBIOTIC 100 MG PO CAPS
1.0000 | ORAL_CAPSULE | Freq: Three times a day (TID) | ORAL | Status: DC
  Filled 2011-10-13: qty 1

## 2011-10-13 MED ORDER — ENSURE COMPLETE PO LIQD
237.0000 mL | Freq: Three times a day (TID) | ORAL | Status: DC
  Administered 2011-10-13 – 2011-10-14 (×2): 237 mL via ORAL

## 2011-10-13 MED ORDER — SODIUM CHLORIDE 0.9 % IV SOLN
1000.0000 mL | Freq: Once | INTRAVENOUS | Status: AC
  Administered 2011-10-13: 1000 mL via INTRAVENOUS

## 2011-10-13 MED ORDER — AMIODARONE HCL 200 MG PO TABS
200.0000 mg | ORAL_TABLET | Freq: Two times a day (BID) | ORAL | Status: DC
  Administered 2011-10-13 – 2011-10-20 (×13): 200 mg via ORAL
  Filled 2011-10-13 (×15): qty 1

## 2011-10-13 MED ORDER — ACETAMINOPHEN 325 MG PO TABS
650.0000 mg | ORAL_TABLET | Freq: Four times a day (QID) | ORAL | Status: DC | PRN
  Administered 2011-10-16 – 2011-10-17 (×3): 650 mg via ORAL
  Filled 2011-10-13 (×3): qty 2

## 2011-10-13 MED ORDER — RISAQUAD PO CAPS
1.0000 | ORAL_CAPSULE | Freq: Three times a day (TID) | ORAL | Status: DC
  Administered 2011-10-14 – 2011-10-20 (×13): 1 via ORAL
  Filled 2011-10-13 (×21): qty 1

## 2011-10-13 MED ORDER — ADULT MULTIVITAMIN W/MINERALS CH
1.0000 | ORAL_TABLET | Freq: Every day | ORAL | Status: DC
  Administered 2011-10-13 – 2011-10-20 (×6): 1 via ORAL
  Filled 2011-10-13 (×8): qty 1

## 2011-10-13 MED ORDER — CITALOPRAM HYDROBROMIDE 20 MG PO TABS
20.0000 mg | ORAL_TABLET | Freq: Every day | ORAL | Status: DC
  Administered 2011-10-13 – 2011-10-20 (×7): 20 mg via ORAL
  Filled 2011-10-13 (×8): qty 1

## 2011-10-13 MED ORDER — THIAMINE HCL 100 MG PO TABS: 100.0000 mg | ORAL_TABLET | Freq: Every day | ORAL | Status: DC

## 2011-10-13 NOTE — Progress Notes (Signed)
[name], is a 78 y.o. male,   MRN: [id number]  -  DOB - 08-05-1926  Outpatient Primary MD for the patient is Oliver Barre, MD  in for    Chief Complaint  Patient presents with  . Dehydration     Blood pressure 127/76, pulse 64, temperature 98 F (36.7 C), temperature source Oral, resp. rate 14, SpO2 97.00%.  Principal Problem:  *Hypernatremia Active Problems:  ADENOCARCINOMA, PROSTATE  CORONARY ARTERY DISEASE  GERD  CEREBROVASCULAR ACCIDENT, HX OF  S/P AVR (aortic valve replacement)  Hypertension  Hemorrhagic cystitis  Atrial fibrillation  Intra-abdominal abscess  Dehydration  This is an 78 year old white male with above hx presents to [facility] ED  presents to ED per dr. Juanda Chance for treatment of dehydration and placement of PEG tube.  Reportedly his appetite is very poor and he refuses to take his medications. Has failure to thrive with 51lb weight loss in last 12 months.  He has a history of atrial fibrillation and is post aortic valve replacement in September 2012. He has a hIstory of Coronary artery disease and a recent stroke. He was hospitalized in August 2013 with C. difficile colitis and responded to Flagyl. He has continued to lose weight. He has a history of prostate cancer, and is status post radiation in 2006. He has an indwelling Foley catheter.  Patient reports that he has not been eating or drinking x several weeks.Patient denies nausea or pain. Stated, "I just don't feel like it." Patient has a foley with blood-tinged urine  Work up in ED yield sodium 147 down from 150 on 9/20, bun 33, potasium 4.4 down from 5.8 on 9/20. Chest xray yields no active disease. Status post median sternotomy. Stable old right rib fractures.  In ED given IV fluids and zofran.   On exam pt alert cachectic, NAD. Denies pain/disocmfort. Mucus membranes mouth pale/dry. VSS    Will admit to medical floor.

## 2011-10-13 NOTE — ED Notes (Signed)
Per EMS- Patient's wife reports that the patient has not been eating or drinking adequately for several weeks. Patient is scheduled for a feeding tube. Patient's wife called PCP today and stated that the patient was not drinking adequately. PCP instructed wife to bring patient to the ED. Dr. Dickie La phoned charge nurse and stated that his labs indicated dehydration.

## 2011-10-13 NOTE — Telephone Encounter (Signed)
Per Dr. Juanda Chance, patient needs to go to the ED due to severe dehydration and then be admitted. Spoke with patient's wife and she will take patient to the ED now. She understands the urgency to get IVF started.

## 2011-10-13 NOTE — ED Provider Notes (Signed)
History     CSN: [id number]  Arrival date & time 10/13/11  1328   First MD Initiated Contact with Patient 10/13/11 1414      Chief Complaint  Patient presents with  . Dehydration     HPI Pt has not been eating or drinking well for several weeks.  He denies pain.  He has had some diarrhea recently.  He states he has able to swallow but does not have any appetite.  Patient has multiple medical problems. Pt has been seeing Dr Dickie La.  He is scheduled for a peg tube.  He had outpatient lab tests that suggest dehydration.  He was told to come to the ED to get IV fluids and be admitted to the hospital.  No complaints of pain.  No fever.  No chest apin. Past Medical History  Diagnosis Date  . ADENOCARCINOMA, PROSTATE   . ALLERGIC RHINITIS   . B12 DEFICIENCY   . BENIGN PROSTATIC HYPERTROPHY   . CAROTID ARTERY DISEASE   . CORONARY ARTERY DISEASE   . DIVERTICULOSIS, COLON   . GERD   . HEMORRHOIDS   . HIATAL HERNIA   . HYPERLIPIDEMIA   . HYPERTENSION   . OSTEOPENIA   . PEPTIC ULCER DISEASE   . PERIPHERAL VASCULAR DISEASE   . Personal history of alcoholism   . TRANSIENT ISCHEMIC ATTACK   . Shingles   . Anemia   . AS (aortic stenosis)   . S/P AVR (aortic valve replacement) 09/23/2010  . Complication of anesthesia 09/23/2010    very confused after waking up. Stopped drinking 40 days before this surgery.  . Transfusion reaction, chill fever type 04/28/2011    Fever, rigors, tachycardia,tachypnea but no evidence of hemolysis  . Hemorrhagic cystitis 04/28/2011    Due to previous radiation for PSA recurrence of prostate cancer  . Blood transfusion   . Gross hematuria   . CVA (cerebrovascular accident) 08/29/2011    08/28/11  . C. difficile colitis     Past Surgical History  Procedure Date  . Popliteal synovial cyst excision 1998  . Carotid endarterectomy 1999    right, Left carotid total chronic occlusion  . Prostate surgery 2001  . Coronary angioplasty with stent placement 2004  .  Aortic valve replacement 09/23/2010    #35mm [facility] Ease pericardial tissue valve  . Coronary artery bypass graft 09/23/2010    CABG x1 using LIMA to LAD  . Cystoscopy with biopsy 02/15/2011    Procedure: CYSTOSCOPY WITH BIOPSY;  Surgeon: Anner Crete, MD;  Location: [facility];  Service: Urology;  Laterality: N/A;  Cystoscopy, biopsy and fulguration of the bladder    Family History  Problem Relation Age of Onset  . Colon cancer Mother 86  . Diabetes Sister   . Hypertension Sister   . Coronary artery disease Sister   . Multiple sclerosis Sister   . Stroke Brother     History  Substance Use Topics  . Smoking status: Former Smoker    Quit date: 02/14/1958  . Smokeless tobacco: Never Used  . Alcohol Use: No     last drink 3 days ago, he has not been drinking as much      Review of Systems  All other systems reviewed and are negative.    Allergies  Tolterodine tartrate  Home Medications   Current Outpatient Rx  Name Route Sig Dispense Refill  . AMIODARONE HCL 200 MG PO TABS Oral Take 200 mg by mouth 2 (two) times daily.    Marland Kitchen  ASPIRIN 81 MG PO TABS Oral Take 81 mg by mouth daily.    Marland Kitchen CITALOPRAM HYDROBROMIDE 20 MG PO TABS Oral Take 1 tablet (20 mg total) by mouth daily. 90 tablet 3  . DIPHENOXYLATE-ATROPINE 2.5-0.025 MG PO TABS Oral Take 1 tablet by mouth 4 (four) times daily as needed.    Marland Kitchen FAMOTIDINE 40 MG PO TABS Oral Take 40 mg by mouth daily.    Marland Kitchen ENSURE COMPLETE PO LIQD Oral Take 237 mLs by mouth 3 (three) times daily between meals.    Di Kindle SULFATE 325 (65 FE) MG PO TABS Oral Take 325 mg by mouth 2 (two) times daily.    . ACIDOPHILUS PROBIOTIC 100 MG PO CAPS Oral Take 1 capsule by mouth 3 (three) times daily with meals.    Marland Kitchen LEVOTHYROXINE SODIUM 25 MCG PO TABS Oral Take 25 mcg by mouth daily before breakfast.    . MEGESTROL ACETATE 40 MG/ML PO SUSP Oral Take 5 mLs (200 mg total) by mouth daily. 240 mL 0  . MIRTAZAPINE 30 MG PO TABS Oral Take 1 tablet (30 mg  total) by mouth daily. 30 tablet 11  . ADULT MULTIVITAMIN W/MINERALS CH Oral Take 1 tablet by mouth daily.    Marland Kitchen POTASSIUM CHLORIDE CRYS ER 20 MEQ PO TBCR Oral Take 20 mEq by mouth daily.    . THIAMINE HCL 100 MG PO TABS Oral Take 100 mg by mouth daily.    Marland Kitchen SWEEN EX CREA Topical Apply 1 application topically daily.      BP 128/87  Pulse 80  Temp 98 F (36.7 C) (Oral)  Resp 23  SpO2 100%  Physical Exam  Nursing note and vitals reviewed. Constitutional: No distress.       Frail, emaciated  HENT:  Head: Normocephalic and atraumatic.  Right Ear: External ear normal.  Left Ear: External ear normal.       Mucous membranes are dry  Eyes: Conjunctivae normal are normal. Right eye exhibits no discharge. Left eye exhibits no discharge. No scleral icterus.  Neck: Neck supple. No tracheal deviation present.  Cardiovascular: Normal rate, regular rhythm and intact distal pulses.   Pulmonary/Chest: Effort normal and breath sounds normal. No stridor. No respiratory distress. He has no wheezes. He has no rales.  Abdominal: Soft. Bowel sounds are normal. He exhibits no distension. There is no tenderness. There is no rebound and no guarding.  Musculoskeletal: He exhibits no edema and no tenderness.  Neurological: He is alert. He has normal strength. No sensory deficit. Cranial nerve deficit:  no gross defecits noted. He exhibits normal muscle tone. He displays no seizure activity. Coordination normal.  Skin: Skin is warm and dry. No rash noted.  Psychiatric: He has a normal mood and affect.    ED Course  Procedures (including critical care time)  Labs Reviewed  COMPREHENSIVE METABOLIC PANEL - Abnormal; Notable for the following:    BUN 30 (*)     Calcium 8.3 (*)     Total Protein 5.3 (*)     Albumin 2.0 (*)     GFR calc non Af Amer 76 (*)     GFR calc Af Amer 88 (*)     All other components within normal limits  CBC WITH DIFFERENTIAL - Abnormal; Notable for the following:    WBC 11.2 (*)      RBC 3.80 (*)     Hemoglobin 11.0 (*)     HCT 33.3 (*)     RDW 18.5 (*)  Neutrophils Relative 87 (*)     Neutro Abs 9.7 (*)     Lymphocytes Relative 9 (*)     All other components within normal limits  POCT I-STAT, CHEM 8 - Abnormal; Notable for the following:    Sodium 147 (*)     BUN 33 (*)     Calcium, Ion 1.12 (*)     Hemoglobin 11.6 (*)     HCT 34.0 (*)     All other components within normal limits  LIPASE, BLOOD  URINALYSIS, ROUTINE W REFLEX MICROSCOPIC   Dg Chest Portable 1 View  10/13/2011  *RADIOLOGY REPORT*  Clinical Data: Dehydration  PORTABLE CHEST - 1 VIEW  Comparison: 09/03/2011  Findings: Cardiomediastinal silhouette is stable.  Status post median sternotomy again noted.  No acute infiltrate or pulmonary edema.  Stable old right rib fractures.  IMPRESSION: No active disease.  Status post median sternotomy.  Stable old right rib fractures.   Original Report Authenticated By: Natasha Mead, M.D.      1. Dehydration   2. Anemia   3. Severe malnutrition       MDM  Pt sent of admission for dehydration and malnutrition.  Will continue IV fluids.  Admit for further treatment.  GI per outpt notes plans on feeding tube insertion.        Celene Kras, MD 10/13/11 [phone]

## 2011-10-13 NOTE — H&P (Signed)
History and Physical Examination  Date: 10/13/2011  Patient name: [name] Medical record number: [id number] Date of birth: [birth year] Age: 78 y.o. Gender: male PCP: Oliver Barre, MD  Chief Complaint:  Chief Complaint  Patient presents with  . Dehydration     History of Present Illness: [name] is an 78 y.o. male with multiple chronic comorbidities as detailed below who was sent to the emergency department by his  primary gastroenterologist for PICC placement.  The patient has no appetite and has not been consuming enough fluids to sustain himself at home.  He is severely dehydrated.  He has become hypernatremic and started to develop some renal insufficiency.  The patient has been working closely with his primary care physician and gastroenterologist and the family has made the decision that they would like to pursue PEG placement.  The patient reports that he has no appetite and he is severely fatigued and has no interest in eating.  He also has difficulty swallowing his pills at home.  His wife reports that she has a difficult time getting him to take his medications.  The PEG tube will help with medication administration as well as assist with treatment of severe malnutrition.  Patient has recently completed hyperbaric oxygen therapy for chronic gross hematuria with significant blood loss anemia.  The patient reports that recently he noticed that he's starting to experience gross hematuria.  He has a chronic indwelling Foley catheter.    Past Medical History Past Medical History  Diagnosis Date  . ADENOCARCINOMA, PROSTATE   . ALLERGIC RHINITIS   . B12 DEFICIENCY   . BENIGN PROSTATIC HYPERTROPHY   . CAROTID ARTERY DISEASE   . CORONARY ARTERY DISEASE   . DIVERTICULOSIS, COLON   . GERD   . HEMORRHOIDS   . HIATAL HERNIA   . HYPERLIPIDEMIA   . HYPERTENSION   . OSTEOPENIA   . PEPTIC ULCER DISEASE   . PERIPHERAL VASCULAR DISEASE   . Personal history of alcoholism   . TRANSIENT  ISCHEMIC ATTACK   . Shingles   . Anemia   . AS (aortic stenosis)   . S/P AVR (aortic valve replacement) 09/23/2010  . Complication of anesthesia 09/23/2010    very confused after waking up. Stopped drinking 40 days before this surgery.  . Transfusion reaction, chill fever type 04/28/2011    Fever, rigors, tachycardia,tachypnea but no evidence of hemolysis  . Hemorrhagic cystitis 04/28/2011    Due to previous radiation for PSA recurrence of prostate cancer  . Blood transfusion   . Gross hematuria   . CVA (cerebrovascular accident) 08/29/2011    08/28/11  . C. difficile colitis     Past Surgical History Past Surgical History  Procedure Date  . Popliteal synovial cyst excision 1998  . Carotid endarterectomy 1999    right, Left carotid total chronic occlusion  . Prostate surgery 2001  . Coronary angioplasty with stent placement 2004  . Aortic valve replacement 09/23/2010    #74mm [facility] Ease pericardial tissue valve  . Coronary artery bypass graft 09/23/2010    CABG x1 using LIMA to LAD  . Cystoscopy with biopsy 02/15/2011    Procedure: CYSTOSCOPY WITH BIOPSY;  Surgeon: Anner Crete, MD;  Location: [facility];  Service: Urology;  Laterality: N/A;  Cystoscopy, biopsy and fulguration of the bladder    Home Meds: Prior to Admission medications   Medication Sig Start Date End Date Taking? Authorizing Provider  amiodarone (PACERONE) 200 MG tablet Take 200 mg  by mouth 2 (two) times daily. 05/28/11 05/27/12  Rollene Rotunda, MD  aspirin 81 MG tablet Take 81 mg by mouth daily.    Historical Provider, MD  citalopram (CELEXA) 20 MG tablet Take 1 tablet (20 mg total) by mouth daily. 09/17/11 09/16/12  Corwin Levins, MD  diphenoxylate-atropine (LOMOTIL) 2.5-0.025 MG per tablet Take 1 tablet by mouth 4 (four) times daily as needed.    Historical Provider, MD  famotidine (PEPCID) 40 MG tablet Take 40 mg by mouth daily. 09/02/11 09/01/12  Rodolph Bong, MD  feeding supplement (ENSURE COMPLETE) LIQD Take  237 mLs by mouth 3 (three) times daily between meals. 06/11/11   Osvaldo Shipper, MD  ferrous sulfate 325 (65 FE) MG tablet Take 325 mg by mouth 2 (two) times daily. 05/11/11 05/10/12  Alison Murray, MD  Lactobacillus (ACIDOPHILUS PROBIOTIC) 100 MG CAPS Take 1 capsule by mouth 3 (three) times daily with meals. 07/24/11   Rashaunda Rahl Cyndie Mull, MD  levothyroxine (SYNTHROID, LEVOTHROID) 25 MCG tablet Take 25 mcg by mouth daily before breakfast. 08/24/11 08/23/12  Corwin Levins, MD  megestrol (MEGACE ORAL) 40 MG/ML suspension Take 5 mLs (200 mg total) by mouth daily. 10/12/11   Hart Carwin, MD  mirtazapine (REMERON) 30 MG tablet Take 1 tablet (30 mg total) by mouth daily. 10/08/11   Corwin Levins, MD  Multiple Vitamin (MULITIVITAMIN WITH MINERALS) TABS Take 1 tablet by mouth daily.    Historical Provider, MD  potassium chloride SA (K-DUR,KLOR-CON) 20 MEQ tablet Take 20 mEq by mouth daily. 06/18/11 06/17/12  Rollene Rotunda, MD  thiamine 100 MG tablet Take 100 mg by mouth daily. 07/21/11 07/20/12  Corwin Levins, MD  Vitamins A & D (VITAMIN A & D) cream Apply 1 application topically daily.    Historical Provider, MD    Allergies: Tolterodine tartrate  Social History:  History   Social History  . Marital Status: Married    Spouse Name: N/A    Number of Children: N/A  . Years of Education: N/A   Occupational History  . Retired    Social History Main Topics  . Smoking status: Former Smoker    Quit date: 02/14/1958  . Smokeless tobacco: Never Used  . Alcohol Use: No     last drink 3 days ago, he has not been drinking as much  . Drug Use: No  . Sexually Active: No   Other Topics Concern  . Not on file   Social History Narrative  . No narrative on file   Family History:  Family History  Problem Relation Age of Onset  . Colon cancer Mother 56  . Diabetes Sister   . Hypertension Sister   . Coronary artery disease Sister   . Multiple sclerosis Sister   . Stroke Brother     Review of  Systems: Pertinent items are noted in HPI. All other systems reviewed and reported as negative.   Physical Exam: Blood pressure 127/76, pulse 64, temperature 98 F (36.7 C), temperature source Oral, resp. rate 14, SpO2 97.00%. General appearance: alert, cooperative, appears stated age, cachectic, fatigued and no distress Head: Normocephalic, without obvious abnormality, atraumatic Eyes: severe dry mucous membranes Nose: Nares normal. Septum midline. Mucosa normal. No drainage or sinus tenderness., no discharge Throat: pale mucosa, dry membranes Neck: no adenopathy, no carotid bruit, no JVD, supple, symmetrical, trachea midline and thyroid not enlarged, symmetric, no tenderness/mass/nodules Lungs: clear to auscultation bilaterally and normal percussion bilaterally Chest wall: no tenderness Heart:  S1, S2 normal Abdomen: soft, non-tender; bowel sounds normal; no masses,  no organomegaly Extremities: extremities normal, atraumatic, no cyanosis or edema Pulses: weak pedal pulses bilateral Skin: no gross lesions seen Neurologic: Grossly normal  Lab  And Imaging results:  Results for orders placed during the hospital encounter of 10/13/11 (from the past 24 hour(s))  COMPREHENSIVE METABOLIC PANEL     Status: Abnormal   Collection Time   10/13/11  2:48 PM      Component Value Range   Sodium 144  135 - 145 mEq/L   Potassium 4.4  3.5 - 5.1 mEq/L   Chloride 111  96 - 112 mEq/L   CO2 23  19 - 32 mEq/L   Glucose, Bld 80  70 - 99 mg/dL   BUN 30 (*) 6 - 23 mg/dL   Creatinine, Ser 2.84  0.50 - 1.35 mg/dL   Calcium 8.3 (*) 8.4 - 10.5 mg/dL   Total Protein 5.3 (*) 6.0 - 8.3 g/dL   Albumin 2.0 (*) 3.5 - 5.2 g/dL   AST 24  0 - 37 U/L   ALT 13  0 - 53 U/L   Alkaline Phosphatase 95  39 - 117 U/L   Total Bilirubin 0.3  0.3 - 1.2 mg/dL   GFR calc non Af Amer 76 (*) >90 mL/min   GFR calc Af Amer 88 (*) >90 mL/min  LIPASE, BLOOD     Status: Normal   Collection Time   10/13/11  2:48 PM       Component Value Range   Lipase 30  11 - 59 U/L  CBC WITH DIFFERENTIAL     Status: Abnormal   Collection Time   10/13/11  2:48 PM      Component Value Range   WBC 11.2 (*) 4.0 - 10.5 K/uL   RBC 3.80 (*) 4.22 - 5.81 MIL/uL   Hemoglobin 11.0 (*) 13.0 - 17.0 g/dL   HCT 13.2 (*) 44.0 - 10.2 %   MCV 87.6  78.0 - 100.0 fL   MCH 28.9  26.0 - 34.0 pg   MCHC 33.0  30.0 - 36.0 g/dL   RDW 72.5 (*) 36.6 - 44.0 %   Platelets 266  150 - 400 K/uL   Neutrophils Relative 87 (*) 43 - 77 %   Neutro Abs 9.7 (*) 1.7 - 7.7 K/uL   Lymphocytes Relative 9 (*) 12 - 46 %   Lymphs Abs 1.0  0.7 - 4.0 K/uL   Monocytes Relative 4  3 - 12 %   Monocytes Absolute 0.5  0.1 - 1.0 K/uL   Eosinophils Relative 0  0 - 5 %   Eosinophils Absolute 0.0  0.0 - 0.7 K/uL   Basophils Relative 0  0 - 1 %   Basophils Absolute 0.0  0.0 - 0.1 K/uL  POCT I-STAT, CHEM 8     Status: Abnormal   Collection Time   10/13/11  3:08 PM      Component Value Range   Sodium 147 (*) 135 - 145 mEq/L   Potassium 4.4  3.5 - 5.1 mEq/L   Chloride 111  96 - 112 mEq/L   BUN 33 (*) 6 - 23 mg/dL   Creatinine, Ser 3.47  0.50 - 1.35 mg/dL   Glucose, Bld 76  70 - 99 mg/dL   Calcium, Ion 4.25 (*) 1.13 - 1.30 mmol/L   TCO2 24  0 - 100 mmol/L   Hemoglobin 11.6 (*) 13.0 - 17.0 g/dL   HCT 95.6 (*)  39.0 - 52.0 %     Impression   *Hypernatremia  ADENOCARCINOMA, PROSTATE  CORONARY ARTERY DISEASE  GERD  CEREBROVASCULAR ACCIDENT, HX OF  S/P AVR (aortic valve replacement)  Hypertension  Hemorrhagic cystitis  Atrial fibrillation  Protein-calorie malnutrition, severe  Intra-abdominal abscess  Severe malnutrition  Dehydration  Dysphagia   Plan  Admit for IVF hydration, consult gastroenterology for PEG placement, NPO, follow lytes, follow hemoglobin, consult dietician for eval and recommendations regarding nutrition, Please see orders and follow hospital course.   Standley Dakins MD [facility] [place],  [place] [phone] 10/13/2011, 5:01 PM

## 2011-10-13 NOTE — ED Notes (Signed)
Patient reports that he has not been eating or drinking x several weeks.Patient denies nausea or pain. Stated, "I just don't feel like it." Patient has a foley with blood-tinged urine

## 2011-10-13 NOTE — ED Notes (Signed)
Report called to David Rn

## 2011-10-13 NOTE — ED Notes (Signed)
[id number]<ZH> Expected date:<BR> Expected time: 1:20 PM<BR> Means of arrival:Ambulance<BR> Comments:<BR> Dehydration, malnutrition; awaiting peg placement

## 2011-10-14 ENCOUNTER — Telehealth: Payer: Self-pay | Admitting: *Deleted

## 2011-10-14 DIAGNOSIS — R633 Feeding difficulties, unspecified: Secondary | ICD-10-CM

## 2011-10-14 DIAGNOSIS — E43 Unspecified severe protein-calorie malnutrition: Secondary | ICD-10-CM

## 2011-10-14 LAB — COMPREHENSIVE METABOLIC PANEL
ALT: 10 U/L (ref 0–53)
Albumin: 1.5 g/dL — ABNORMAL LOW (ref 3.5–5.2)
Alkaline Phosphatase: 85 U/L (ref 39–117)
BUN: 21 mg/dL (ref 6–23)
Potassium: 3.5 mEq/L (ref 3.5–5.1)
Sodium: 143 mEq/L (ref 135–145)
Total Protein: 4.2 g/dL — ABNORMAL LOW (ref 6.0–8.3)

## 2011-10-14 LAB — CBC
HCT: 29.9 % — ABNORMAL LOW (ref 39.0–52.0)
MCV: 87.7 fL (ref 78.0–100.0)
RDW: 18.1 % — ABNORMAL HIGH (ref 11.5–15.5)
WBC: 9 10*3/uL (ref 4.0–10.5)

## 2011-10-14 LAB — TSH: TSH: 8.914 u[IU]/mL — ABNORMAL HIGH (ref 0.350–4.500)

## 2011-10-14 MED ORDER — CEFAZOLIN SODIUM 1-5 GM-% IV SOLN
1.0000 g | INTRAVENOUS | Status: AC
  Administered 2011-10-15: 1 g via INTRAVENOUS
  Filled 2011-10-14: qty 50

## 2011-10-14 NOTE — Evaluation (Signed)
Occupational Therapy Evaluation Patient Details Name: [name] MRN: [id number] DOB: 1926-09-21 Today's Date: 10/14/2011 Time: 1191-4782 OT Time Calculation (min): 24 min  OT Assessment / Plan / Recommendation Clinical Impression  Pt is an 78 yo male who presents with hyponatremia and peg placement. Skilled OT indiciated to maximize independence with BADLs to min A level in prep for d/c to next venue of care.    OT Assessment  Patient needs continued OT Services    Follow Up Recommendations  Skilled nursing facility    Barriers to Discharge Decreased caregiver support    Equipment Recommendations  None recommended by OT    Recommendations for Other Services    Frequency  Min 2X/week    Precautions / Restrictions Precautions Precautions: Fall   Pertinent Vitals/Pain No c/o pain    ADL  Grooming: Simulated;Set up Where Assessed - Grooming: Unsupported sitting Upper Body Bathing: Simulated;Set up Where Assessed - Upper Body Bathing: Unsupported sitting Lower Body Bathing: Simulated;Maximal assistance Where Assessed - Lower Body Bathing: Supported sit to stand Upper Body Dressing: Simulated;Set up Where Assessed - Upper Body Dressing: Unsupported sitting Lower Body Dressing: Simulated;Maximal assistance Where Assessed - Lower Body Dressing: Supported sit to stand Toilet Transfer: Simulated;Minimal assistance Toilet Transfer Method: Sit to stand Toileting - Clothing Manipulation and Hygiene: Simulated;Maximal assistance Where Assessed - Engineer, mining and Hygiene: Standing Equipment Used: Rolling walker;Gait belt Transfers/Ambulation Related to ADLs: Pt ambulated with RW and MAX VCs for safety.     OT Diagnosis: Generalized weakness  OT Problem List: Decreased safety awareness;Decreased strength;Decreased cognition;Decreased activity tolerance;Decreased knowledge of use of DME or AE OT Treatment Interventions: Self-care/ADL training;Therapeutic  activities;DME and/or AE instruction;Patient/family education   OT Goals Acute Rehab OT Goals OT Goal Formulation: With patient Time For Goal Achievement: 10/28/11 Potential to Achieve Goals: Good ADL Goals Pt Will Perform Grooming: with supervision;Standing at sink ADL Goal: Grooming - Progress: Goal set today Pt Will Perform Lower Body Bathing: with min assist;Sit to stand from chair;Sit to stand from bed ADL Goal: Lower Body Bathing - Progress: Goal set today Pt Will Perform Lower Body Dressing: with min assist;Sit to stand from chair;Sit to stand from bed ADL Goal: Lower Body Dressing - Progress: Goal set today Pt Will Transfer to Toilet: with supervision;Ambulation;Comfort height toilet;3-in-1;Raised toilet seat with arms ADL Goal: Toilet Transfer - Progress: Goal set today Pt Will Perform Toileting - Clothing Manipulation: with min assist;Standing;Sitting on 3-in-1 or toilet ADL Goal: Toileting - Clothing Manipulation - Progress: Goal set today Pt Will Perform Toileting - Hygiene: with min assist;Sit to stand from 3-in-1/toilet ADL Goal: Toileting - Hygiene - Progress: Goal set today  Visit Information  Last OT Received On: 10/14/11 Assistance Needed: +1    Subjective Data  Subjective: I drive a motorcycle. Patient Stated Goal: Not asked   Prior Functioning     Home Living Lives With: Spouse Available Help at Discharge: Family;Available PRN/intermittently Type of Home: House Home Access: Stairs to enter [employer] of Steps: 5 Entrance Stairs-Rails: None Home Layout: One level Bathroom Shower/Tub: Engineer, manufacturing systems: Standard Home Adaptive Equipment: Shower chair with back;Walker - rolling;Straight cane;Bedside commode/3-in-1 Prior Function Level of Independence: Needs assistance Needs Assistance: Light Housekeeping;Meal Prep;Dressing;Bathing Bath: Moderate Dressing: Moderate Meal Prep: Total Light Housekeeping: Total Able to Take  Stairs?: Yes Driving: Yes Vocation: Retired Musician: No difficulties Dominant Hand: Right         Vision/Perception     Cognition  Overall Cognitive Status: No family/caregiver  present to determine baseline cognitive functioning Area of Impairment: Memory;Safety/judgement;Awareness of deficits;Executive functioning Arousal/Alertness: Awake/alert Orientation Level: Disoriented to;Place;Time;Situation Behavior During Session: [facility] for tasks performed Memory: Decreased recall of precautions Safety/Judgement: Decreased awareness of safety precautions;Decreased safety judgement for tasks assessed;Impulsive;Decreased awareness of need for assistance Awareness of Errors: Assistance required to correct errors made;Assistance required to identify errors made    Extremity/Trunk Assessment Right Upper Extremity Assessment RUE ROM/Strength/Tone: [facility] for tasks assessed Left Upper Extremity Assessment LUE ROM/Strength/Tone: Deficits LUE ROM/Strength/Tone Deficits: 3+/5 in shoulder WFL at elbow and distally. LUE Sensation: Deficits LUE Sensation Deficits: to light touch from old CVA. Right Lower Extremity Assessment RLE ROM/Strength/Tone: WFL for tasks assessed RLE ROM/Strength/Tone Deficits: grossly Left Lower Extremity Assessment LLE ROM/Strength/Tone: WFL for tasks assessed LLE ROM/Strength/Tone Deficits: grossly     Mobility Bed Mobility Bed Mobility: Supine to Sit;Sit to Supine Supine to Sit: 5: Supervision;HOB flat Sit to Supine: 5: Supervision;HOB flat Transfers Sit to Stand: 4: Min assist;With upper extremity assist;From bed Stand to Sit: To bed;With upper extremity assist;4: Min guard Details for Transfer Assistance: VC for use of UE to push off and reach beck     Shoulder Instructions     Exercise     Balance Static Sitting Balance Static Sitting - Balance Support: No upper extremity supported;Feet supported Static Sitting - Level of Assistance: 5:  Stand by assistance   End of Session OT - End of Session Equipment Utilized During Treatment: Gait belt Activity Tolerance: Patient tolerated treatment well Patient left: in bed;with call bell/phone within reach;with bed alarm set  GO     Lejla Moeser A OTR/L  10/14/2011, 11:15 AM

## 2011-10-14 NOTE — Progress Notes (Signed)
Patient ID: [name], male   DOB: 1926/02/08, 78 y.o.   MRN: [id number]   I discussed PEG again with pt, and wife-questions answered, and risks reviewed including infection, bleeding, perforation and anesthesia complications. They are agreeable to proceed with PEG.  Will ask Dietician to see for Peg feeding  calorie and free water goals Pt had malodorous loose stool while in room- will check cdiff pcr  [name], PA-C  Adele Milson T. Russella Dar MD Clementeen Graham

## 2011-10-14 NOTE — Progress Notes (Signed)
INITIAL ADULT NUTRITION ASSESSMENT Date: 10/14/2011   Time: 3:26 PM Reason for Assessment: Consult, nutrition risk, low braden   INTERVENTION: Pt at high risk for refeeding syndrome, recommend MD monitor pt's potassium, magnesium, and phosphorus. Once PEG placed tomorrow and ready for use, recommend give pt 1/2 can of Osmolite 1.5 every 4 hours as tolerated and then 1 can every 4 hours the following day as tolerated. Goal is 6 cans of Osmolite 1.5 daily via PEG which will provide 2130 calories, 89g protein, and free water. Recommend 60ml water flushes before and after each bolus TF can and an additional water flushes 3 times/day to meet all of pt's estimated fluid needs. Will monitor.   Pt meets criteria for severe malnutrition of chronic illness AEB 26.7% weight loss in the past year, with 8.2% weight loss in the past month, in addition to pt with <50% estimated energy intake for the past several weeks per wife's report and pt with severe muscle wasting and subcutaneous fat loss in upper and lower extremities as well as sternal region.    ASSESSMENT: Male 78 y.o.  Dx: Hypernatremia  Food/Nutrition Related Hx: Pt admitted with weight loss and failure to thrive. Per GI notes, pt weighed 183 pounds in September of last year, then 146 pounds last month, now at 134 pounds. Pt with poor appetite and c/o bad taste in his mouth. Because of pt's weight loss, his dentures to do not fit and he has problems chewing foods. Wife frustrated with pt's refusal to eat. Pt with loose stools PTA. Pt started on Megace on 9/24. Pt reports he drinks up to 1 Ensure sometimes every other day. Wife reports pt has not been eating more than 1-2 bites of food at a time for the past several weeks. Wife reports pt's bad taste in his mouth makes him not want to eat the foods he usually likes. Pt very thin and cachetic.   Hx:  Past Medical History  Diagnosis Date  . ADENOCARCINOMA, PROSTATE   . ALLERGIC RHINITIS     . B12 DEFICIENCY   . BENIGN PROSTATIC HYPERTROPHY   . CAROTID ARTERY DISEASE   . CORONARY ARTERY DISEASE   . DIVERTICULOSIS, COLON   . GERD   . HEMORRHOIDS   . HIATAL HERNIA   . HYPERLIPIDEMIA   . HYPERTENSION   . OSTEOPENIA   . PEPTIC ULCER DISEASE   . PERIPHERAL VASCULAR DISEASE   . Personal history of alcoholism   . TRANSIENT ISCHEMIC ATTACK   . Shingles   . Anemia   . AS (aortic stenosis)   . S/P AVR (aortic valve replacement) 09/23/2010  . Complication of anesthesia 09/23/2010    very confused after waking up. Stopped drinking 40 days before this surgery.  . Transfusion reaction, chill fever type 04/28/2011    Fever, rigors, tachycardia,tachypnea but no evidence of hemolysis  . Hemorrhagic cystitis 04/28/2011    Due to previous radiation for PSA recurrence of prostate cancer  . Blood transfusion   . Gross hematuria   . CVA (cerebrovascular accident) 08/29/2011    08/28/11  . C. difficile colitis    Related Meds:  Scheduled Meds:   . acidophilus  1 capsule Oral TID WC  . amiodarone  200 mg Oral BID  .  ceFAZolin (ANCEF) IV  1 g Intravenous 60 min Pre-Op  . citalopram  20 mg Oral Daily  . feeding supplement  237 mL Oral TID BM  . ferrous sulfate  325 mg  Oral Q breakfast  . levothyroxine  25 mcg Oral QAC breakfast  . megestrol  200 mg Oral Daily  . metroNIDAZOLE  250 mg Oral TID  . mirtazapine  30 mg Oral Daily  . multivitamin with minerals  1 tablet Oral Daily  . pantoprazole  40 mg Oral Q1200  . potassium chloride SA  20 mEq Oral Daily  . thiamine  100 mg Oral Daily  . DISCONTD: ACIDOPHILUS PROBIOTIC  1 capsule Oral TID WC  . DISCONTD: aspirin  81 mg Oral Daily  . DISCONTD: thiamine  100 mg Oral Daily   Continuous Infusions:   . sodium chloride 100 mL/hr at 10/14/11 0640  . DISCONTD: sodium chloride     PRN Meds:.acetaminophen, acetaminophen, diphenoxylate-atropine, ondansetron (ZOFRAN) IV, ondansetron  Ht: 5\' 11"  (180.3 cm)  Wt: 134 lb 12.8 oz (61.145  kg)  Ideal Wt: 172 lb % Ideal Wt: 78  Usual Wt: 183 lb last year per MD notes % Usual Wt: 73  Body mass index is 18.80 kg/(m^2).   Labs:  CMP     Component Value Date/Time   NA 143 10/14/2011 0800   K 3.5 10/14/2011 0800   CL 113* 10/14/2011 0800   CO2 25 10/14/2011 0800   GLUCOSE 73 10/14/2011 0800   BUN 21 10/14/2011 0800   CREATININE 0.81 10/14/2011 0800   CREATININE 1.05 01/29/2011 1708   CALCIUM 7.4* 10/14/2011 0800   PROT 4.2* 10/14/2011 0800   ALBUMIN 1.5* 10/14/2011 0800   AST 18 10/14/2011 0800   ALT 10 10/14/2011 0800   ALKPHOS 85 10/14/2011 0800   BILITOT 0.2* 10/14/2011 0800   GFRNONAA 80* 10/14/2011 0800   GFRAA >90 10/14/2011 0800    Intake/Output Summary (Last 24 hours) at 10/14/11 1649 Last data filed at 10/14/11 0900  Gross per 24 hour  Intake   5409 ml  Output    500 ml  Net   4909 ml   Last BM - 9/25  Diet Order: General  Supplements/Tube Feeding: Ensure Complete TID  IVF:    sodium chloride Last Rate: 100 mL/hr at 10/14/11 0640  DISCONTD: sodium chloride     Estimated Nutritional Needs:   Kcal: 1850-2250 Protein: 90-110g Fluid: 1.8-2.2L  NUTRITION DIAGNOSIS: -Inadequate oral intake (NI-2.1).  Status: Ongoing  RELATED TO: lack of appetite, bad taste in mouth  AS EVIDENCE BY: pt statement, significant weight loss  MONITORING/EVALUATION(Goals): Once TF placed tomorrow, TF to meet >90% of estimated nutritional needs.   EDUCATION NEEDS: -No education needs identified at this time  Dietitian #: [phone]  DOCUMENTATION CODES Per approved criteria  -Severe malnutrition in the context of chronic illness    Marshall Cork 10/14/2011, 3:26 PM

## 2011-10-14 NOTE — Progress Notes (Signed)
CARE MANAGEMENT NOTE 10/14/2011  Patient:  [name], [name]   Account Number:  [id number]  Date Initiated:  10/14/2011  Documentation initiated by:  PEARSON,COOKIE  Subjective/Objective Assessment:   pt admitted with dehydration, confused at times     Action/Plan:   from home   Anticipated DC Date:  10/18/2011   Anticipated DC Plan:  HOME W HOME HEALTH SERVICES      DC Planning Services  CM consult      Choice offered to / List presented to:          [facility] arranged  HH-2 PT  HH-1 RN      [facility] agency  [facility]   Status of service:  In process, will continue to follow Medicare Important Message given?  NA - LOS <3 / Initial given by admissions (If response is "NO", the following Medicare IM given date fields will be blank) Date Medicare IM given:   Date Additional Medicare IM given:    Discharge Disposition:  HOME W HOME HEALTH SERVICES  Per UR Regulation:  Reviewed for med. necessity/level of care/duration of stay  If discussed at Long Length of Stay Meetings, dates discussed:    Comments:  10/14/11 MPearson, RN, BSN Chart reviewed. Pt was with [facility]/wife and want to continue with [facility] at discharge. Will need Home Orders for HHRN, HHPT.

## 2011-10-14 NOTE — Progress Notes (Signed)
The patient was found at approximately 2030 on the floor in NAD.  The patient said he was looking for his call bell.  Call bell noted to be on the bed by the patient's pillow.  Per the patient he did not hit his head.  VS stable, the patient has no c/o pain.  No obvious trauma noted.  AC, NP, Charge RN and Erskine Emery all notified.  Bed alarm is on and door is open to ensure patient's safety.

## 2011-10-14 NOTE — Telephone Encounter (Signed)
Thanks, OK not to use Megace now that he is in the hospital and will be getting PEG. Thanx DB

## 2011-10-14 NOTE — Telephone Encounter (Signed)
Dr Juanda Chance- Just an FYI... Medicare has denied patient's Megace (I advised them that it was for failure to thrive, feeding difficulties and weight loss). I asked about alternatives (since I am unaware of alternatives for Megace), however, they had no suggestions. I am aware that patient is receiving a feeding tube shortly, therefore, I realize that he may no longer need Megace anyway. Just wanted you to know.

## 2011-10-14 NOTE — Progress Notes (Signed)
[facility] Progress Note  10/14/2011   Subjective: Pt without complaints, He is scheduled for PEG placement tomorrow  Objective:  Vital signs in last 24 hours: Filed Vitals:   10/14/11 0627 10/14/11 0638 10/14/11 0700 10/14/11 1329  BP: 84/62 126/78  103/57  Pulse: 73 76  73  Temp: 97.3 F (36.3 C)   98 F (36.7 C)  TempSrc: Oral   Oral  Resp: 18   18  Height:      Weight:   61.145 kg (134 lb 12.8 oz)   SpO2: 100%   100%   Weight change:   Intake/Output Summary (Last 24 hours) at 10/14/11 1536 Last data filed at 10/14/11 0900  Gross per 24 hour  Intake   5409 ml  Output    500 ml  Net   4909 ml   Lab Results  Component Value Date   HGBA1C 5.2 08/29/2011   HGBA1C 5.6 09/18/2010   Lab Results  Component Value Date   LDLCALC 29 08/29/2011   CREATININE 0.81 10/14/2011    Review of Systems As above, otherwise all reviewed and reported negative  Physical Exam General - awake, cachectic,  no distress, cooperative HEENT - NCAT, MM dry Lungs - BBS, shallow, CTA CV - normal s1, s2 sounds Abd - soft, nondistended, no masses, nontender Ext - no C/C/E  Lab Results: Results for orders placed during the hospital encounter of 10/13/11 (from the past 24 hour(s))  URINALYSIS, ROUTINE W REFLEX MICROSCOPIC     Status: Abnormal   Collection Time   10/13/11  4:32 PM      Component Value Range   Color, Urine RED (*) YELLOW   APPearance TURBID (*) CLEAR   Specific Gravity, Urine 1.018  1.005 - 1.030   pH 8.0  5.0 - 8.0   Glucose, UA NEGATIVE  NEGATIVE mg/dL   Hgb urine dipstick LARGE (*) NEGATIVE   Bilirubin Urine MODERATE (*) NEGATIVE   Ketones, ur 40 (*) NEGATIVE mg/dL   Protein, ur >528 (*) NEGATIVE mg/dL   Urobilinogen, UA 1.0  0.0 - 1.0 mg/dL   Nitrite POSITIVE (*) NEGATIVE   Leukocytes, UA LARGE (*) NEGATIVE  URINE MICROSCOPIC-ADD ON     Status: Abnormal   Collection Time   10/13/11  4:32 PM      Component Value Range   WBC, UA FIELD OBSCURED BY RBC'S  <3  WBC/hpf   RBC / HPF TOO NUMEROUS TO COUNT  <3 RBC/hpf   Bacteria, UA MANY (*) RARE  COMPREHENSIVE METABOLIC PANEL     Status: Abnormal   Collection Time   10/14/11  8:00 AM      Component Value Range   Sodium 143  135 - 145 mEq/L   Potassium 3.5  3.5 - 5.1 mEq/L   Chloride 113 (*) 96 - 112 mEq/L   CO2 25  19 - 32 mEq/L   Glucose, Bld 73  70 - 99 mg/dL   BUN 21  6 - 23 mg/dL   Creatinine, Ser 4.13  0.50 - 1.35 mg/dL   Calcium 7.4 (*) 8.4 - 10.5 mg/dL   Total Protein 4.2 (*) 6.0 - 8.3 g/dL   Albumin 1.5 (*) 3.5 - 5.2 g/dL   AST 18  0 - 37 U/L   ALT 10  0 - 53 U/L   Alkaline Phosphatase 85  39 - 117 U/L   Total Bilirubin 0.2 (*) 0.3 - 1.2 mg/dL   GFR calc non Af Amer 80 (*) >90  mL/min   GFR calc Af Amer >90  >90 mL/min  CBC     Status: Abnormal   Collection Time   10/14/11  8:00 AM      Component Value Range   WBC 9.0  4.0 - 10.5 K/uL   RBC 3.41 (*) 4.22 - 5.81 MIL/uL   Hemoglobin 9.8 (*) 13.0 - 17.0 g/dL   HCT 16.1 (*) 09.6 - 04.5 %   MCV 87.7  78.0 - 100.0 fL   MCH 28.7  26.0 - 34.0 pg   MCHC 32.8  30.0 - 36.0 g/dL   RDW 40.9 (*) 81.1 - 91.4 %   Platelets 248  150 - 400 K/uL  PROTIME-INR     Status: Normal   Collection Time   10/14/11  8:00 AM      Component Value Range   Prothrombin Time 15.0  11.6 - 15.2 seconds   INR 1.20  0.00 - 1.49    Micro Results: Recent Results (from the past 240 hour(s))  URINE CULTURE     Status: Normal   Collection Time   10/08/11  2:40 PM      Component Value Range Status Comment   Colony Count >=100,000 COLONIES/ML   Final    Organism ID, Bacteria Multiple bacterial morphotypes present, none   Final    Organism ID, Bacteria predominant. Suggest appropriate recollection if    Final    Organism ID, Bacteria clinically indicated.   Final     Medications:  Scheduled Meds:   . acidophilus  1 capsule Oral TID WC  . amiodarone  200 mg Oral BID  .  ceFAZolin (ANCEF) IV  1 g Intravenous 60 min Pre-Op  . citalopram  20 mg Oral Daily  .  feeding supplement  237 mL Oral TID BM  . ferrous sulfate  325 mg Oral Q breakfast  . levothyroxine  25 mcg Oral QAC breakfast  . megestrol  200 mg Oral Daily  . metroNIDAZOLE  250 mg Oral TID  . mirtazapine  30 mg Oral Daily  . multivitamin with minerals  1 tablet Oral Daily  . pantoprazole  40 mg Oral Q1200  . potassium chloride SA  20 mEq Oral Daily  . thiamine  100 mg Oral Daily  . DISCONTD: ACIDOPHILUS PROBIOTIC  1 capsule Oral TID WC  . DISCONTD: aspirin  81 mg Oral Daily  . DISCONTD: thiamine  100 mg Oral Daily   Continuous Infusions:   . sodium chloride 100 mL/hr at 10/14/11 0640  . DISCONTD: sodium chloride     PRN Meds:.acetaminophen, acetaminophen, diphenoxylate-atropine, ondansetron (ZOFRAN) IV, ondansetron  Assessment/Plan: Severe malnutrition / Protein-calorie malnutrition, severe  - Pt scheduled for PEG tomorrow    - dietitian consulted for treatment recs  Dehydration - improved with gentle IVF hydration  Hypothyroidism - elevated TSH - pt has had some difficulty taking his oral meds  Dysphagia - for PEG  Hypernatremia  - improved with hydration  ADENOCARCINOMA, PROSTATE   CORONARY ARTERY DISEASE   GERD - continue PPI therapy   CEREBROVASCULAR ACCIDENT, HX OF  S/P AVR (aortic valve replacement)   Hypertension - stable BPs noted on home meds  Hemorrhagic cystitis - chronic  Atrial fibrillation - pt on pacerone and aspirin per his cardiology team (Hochrein)    LOS: 1 day   [name] 10/14/2011, 3:36 PM  Cleora Fleet, MD, CDE, FAAFP [facility], [place]  [phone]

## 2011-10-14 NOTE — Consult Note (Signed)
Referring Provider: [facility] Primary Care Physician:  Oliver Barre, MD Primary Gastroenterologist:  Dr [name] .  Reason for Consultation:  Malnutrition, weight loss failure to thrive  HPI: [name] is a 78 y.o. male  with multiple medical problems who has had a very difficult time after undergoing aortic valve replacement in September of 2012. Prior to that time he weighed 183 pounds and is now down to 132 pounds. His course was complicated by atrial fibrillation, hemorrhagic cystitis, development of pelvic abscess in May of 2013 unclear whether this was related to a microperforation of the bladder or the colon, this required I&D area He then developed C. difficile colitis and was treated for that in August. He has had a steady weight loss over the past 12 months. He states that he has no abdominal pain and no dysphagia. He says he has absolutely no appetite and can't taste anything He also says he has difficulty transferring larger pieces of food do to his dentures not fitting He had continued to have some loose stools but no diarrhea. He also has history of EtOH use chronically apparently has not had a drink in the past 3 or 4 days. He was seen in the office by Dr. Juanda Chance on 10/12/2011, for details please see that note He was started on Megace at that time and PEG placement was discussed with the patient was declining at that time. Labs were ordered and showed that he was dehydrated and admission was advised He was admitted yesterday. In the interim the patient and his family decided they  were agreeable with PEG placement and we are consulted now for PEG placement. I discussed a PEG with the patient, and he says that he realizes that this will help him though he doesn't want one permanently he is willing to have a PEG tube to help him get stronger.   Past Medical History  Diagnosis Date  . ADENOCARCINOMA, PROSTATE   . ALLERGIC RHINITIS   . B12 DEFICIENCY   . BENIGN PROSTATIC  HYPERTROPHY   . CAROTID ARTERY DISEASE   . CORONARY ARTERY DISEASE   . DIVERTICULOSIS, COLON   . GERD   . HEMORRHOIDS   . HIATAL HERNIA   . HYPERLIPIDEMIA   . HYPERTENSION   . OSTEOPENIA   . PEPTIC ULCER DISEASE   . PERIPHERAL VASCULAR DISEASE   . Personal history of alcoholism   . TRANSIENT ISCHEMIC ATTACK   . Shingles   . Anemia   . AS (aortic stenosis)   . S/P AVR (aortic valve replacement) 09/23/2010  . Complication of anesthesia 09/23/2010    very confused after waking up. Stopped drinking 40 days before this surgery.  . Transfusion reaction, chill fever type 04/28/2011    Fever, rigors, tachycardia,tachypnea but no evidence of hemolysis  . Hemorrhagic cystitis 04/28/2011    Due to previous radiation for PSA recurrence of prostate cancer  . Blood transfusion   . Gross hematuria   . CVA (cerebrovascular accident) 08/29/2011    08/28/11  . C. difficile colitis     Past Surgical History  Procedure Date  . Popliteal synovial cyst excision 1998  . Carotid endarterectomy 1999    right, Left carotid total chronic occlusion  . Prostate surgery 2001  . Coronary angioplasty with stent placement 2004  . Aortic valve replacement 09/23/2010    #82mm [facility] Ease pericardial tissue valve  . Coronary artery bypass graft 09/23/2010    CABG x1 using LIMA to LAD  .  Cystoscopy with biopsy 02/15/2011    Procedure: CYSTOSCOPY WITH BIOPSY;  Surgeon: Anner Crete, MD;  Location: [facility]S;  Service: Urology;  Laterality: N/A;  Cystoscopy, biopsy and fulguration of the bladder    Prior to Admission medications   Medication Sig Start Date End Date Taking? Authorizing Provider  amiodarone (PACERONE) 200 MG tablet Take 200 mg by mouth 2 (two) times daily. 05/28/11 05/27/12 Yes Rollene Rotunda, MD  aspirin 81 MG tablet Take 81 mg by mouth daily.   Yes Historical Provider, MD  citalopram (CELEXA) 20 MG tablet Take 1 tablet (20 mg total) by mouth daily. 09/17/11 09/16/12 Yes Corwin Levins, MD    diphenoxylate-atropine (LOMOTIL) 2.5-0.025 MG per tablet Take 1 tablet by mouth 4 (four) times daily as needed.   Yes Historical Provider, MD  famotidine (PEPCID) 40 MG tablet Take 40 mg by mouth daily. 09/02/11 09/01/12 Yes Rodolph Bong, MD  feeding supplement (ENSURE COMPLETE) LIQD Take 237 mLs by mouth 3 (three) times daily between meals. 06/11/11  Yes Osvaldo Shipper, MD  ferrous sulfate 325 (65 FE) MG tablet Take 325 mg by mouth 2 (two) times daily. 05/11/11 05/10/12 Yes Alma Concepcion Elk, MD  Lactobacillus (ACIDOPHILUS PROBIOTIC) 100 MG CAPS Take 1 capsule by mouth 3 (three) times daily with meals. 07/24/11  Yes Clanford Cyndie Mull, MD  levothyroxine (SYNTHROID, LEVOTHROID) 25 MCG tablet Take 25 mcg by mouth daily before breakfast. 08/24/11 08/23/12 Yes Corwin Levins, MD  megestrol (MEGACE ORAL) 40 MG/ML suspension Take 5 mLs (200 mg total) by mouth daily. 10/12/11  Yes Hart Carwin, MD  metroNIDAZOLE (FLAGYL) 500 MG tablet Take 250 mg by mouth 3 (three) times daily. Take 1/2 tablet   Yes Historical Provider, MD  mirtazapine (REMERON) 30 MG tablet Take 1 tablet (30 mg total) by mouth daily. 10/08/11  Yes Corwin Levins, MD  Multiple Vitamin (MULITIVITAMIN WITH MINERALS) TABS Take 1 tablet by mouth daily.   Yes Historical Provider, MD  omeprazole (PRILOSEC) 20 MG capsule Take 20 mg by mouth 2 (two) times daily.   Yes Historical Provider, MD  potassium chloride SA (K-DUR,KLOR-CON) 20 MEQ tablet Take 20 mEq by mouth daily. 06/18/11 06/17/12 Yes Rollene Rotunda, MD  thiamine 100 MG tablet Take 100 mg by mouth daily. 07/21/11 07/20/12 Yes Corwin Levins, MD    Current Facility-Administered Medications  Medication Dose Route Frequency Provider Last Rate Last Dose  . 0.9 %  sodium chloride infusion  1,000 mL Intravenous Once Celene Kras, MD 999 mL/hr at 10/13/11 1443 1,000 mL at 10/13/11 1443  . 0.9 %  sodium chloride infusion   Intravenous Continuous Clanford Cyndie Mull, MD 100 mL/hr at 10/14/11 0640    . acetaminophen  (TYLENOL) tablet 650 mg  650 mg Oral Q6H PRN Clanford Cyndie Mull, MD       Or  . acetaminophen (TYLENOL) suppository 650 mg  650 mg Rectal Q6H PRN Clanford Cyndie Mull, MD      . acidophilus (RISAQUAD) capsule 1 capsule  1 capsule Oral TID WC Clanford L Johnson, MD      . amiodarone (PACERONE) tablet 200 mg  200 mg Oral BID Clanford Cyndie Mull, MD   200 mg at 10/13/11 2130  . aspirin chewable tablet 81 mg  81 mg Oral Daily Clanford Cyndie Mull, MD   81 mg at 10/13/11 2118  . citalopram (CELEXA) tablet 20 mg  20 mg Oral Daily Clanford Cyndie Mull, MD   20 mg at 10/13/11  2118  . diphenoxylate-atropine (LOMOTIL) 2.5-0.025 MG per tablet 1 tablet  1 tablet Oral QID PRN Clanford L Johnson, MD      . feeding supplement (ENSURE COMPLETE) liquid 237 mL  237 mL Oral TID BM Clanford Cyndie Mull, MD   237 mL at 10/13/11 2119  . ferrous sulfate tablet 325 mg  325 mg Oral Q breakfast Clanford L Johnson, MD      . levothyroxine (SYNTHROID, LEVOTHROID) tablet 25 mcg  25 mcg Oral QAC breakfast Clanford Cyndie Mull, MD      . megestrol (MEGACE) 40 MG/ML suspension 200 mg  200 mg Oral Daily Clanford Cyndie Mull, MD   200 mg at 10/13/11 2116  . metroNIDAZOLE (FLAGYL) tablet 250 mg  250 mg Oral TID Clanford Cyndie Mull, MD   250 mg at 10/13/11 2118  . mirtazapine (REMERON) tablet 30 mg  30 mg Oral Daily Clanford Cyndie Mull, MD   30 mg at 10/13/11 2117  . multivitamin with minerals tablet 1 tablet  1 tablet Oral Daily Clanford Cyndie Mull, MD   1 tablet at 10/13/11 2117  . ondansetron (ZOFRAN) injection 4 mg  4 mg Intravenous Once Celene Kras, MD   4 mg at 10/13/11 1445  . ondansetron (ZOFRAN) tablet 4 mg  4 mg Oral Q6H PRN Clanford Cyndie Mull, MD       Or  . ondansetron (ZOFRAN) injection 4 mg  4 mg Intravenous Q6H PRN Clanford L Johnson, MD      . pantoprazole (PROTONIX) EC tablet 40 mg  40 mg Oral Q1200 Clanford L Johnson, MD      . potassium chloride SA (K-DUR,KLOR-CON) CR tablet 20 mEq  20 mEq Oral Daily Clanford Cyndie Mull, MD   20  mEq at 10/13/11 2118  . thiamine (VITAMIN B-1) tablet 100 mg  100 mg Oral Daily Clanford Cyndie Mull, MD   100 mg at 10/13/11 2117  . DISCONTD: 0.9 %  sodium chloride infusion  1,000 mL Intravenous Continuous Celene Kras, MD      . DISCONTD: ACIDOPHILUS PROBIOTIC CAPS 100 mg  1 capsule Oral TID WC Clanford Cyndie Mull, MD      . DISCONTD: thiamine tablet 100 mg  100 mg Oral Daily Clanford Cyndie Mull, MD        Allergies as of 10/13/2011 - Review Complete 10/13/2011  Allergen Reaction Noted  . Tolterodine tartrate      Family History  Problem Relation Age of Onset  . Colon cancer Mother 73  . Diabetes Sister   . Hypertension Sister   . Coronary artery disease Sister   . Multiple sclerosis Sister   . Stroke Brother     History   Social History  . Marital Status: Married    Spouse Name: N/A    Number of Children: N/A  . Years of Education: N/A   Occupational History  . Retired    Social History Main Topics  . Smoking status: Former Smoker    Quit date: 02/14/1958  . Smokeless tobacco: Never Used  . Alcohol Use: No     last drink 3 days ago, he has not been drinking as much  . Drug Use: No  . Sexually Active: No   Other Topics Concern  . Not on file   Social History Narrative  . No narrative on file    Review of Systems: Pertinent positive and negative review of systems were noted in the above HPI section.  All other review of  systems was otherwise negative. Physical Exam: Vital signs in last 24 hours: Temp:  [97.3 F (36.3 C)-98.7 F (37.1 C)] 97.3 F (36.3 C) (09/26 0627) Pulse Rate:  [63-80] 76  (09/26 0638) Resp:  [14-25] 18  (09/26 0627) BP: (84-151)/(58-87) 126/78 mmHg (09/26 0638) SpO2:  [97 %-100 %] 100 % (09/26 0627) Weight:  [126 lb 8 oz (57.38 kg)-134 lb 12.8 oz (61.145 kg)] 134 lb 12.8 oz (61.145 kg) (09/26 0700) Last BM Date: 10/13/11 General:   Alert,  Well-developed, cachectic-appearing elderly white male, pleasant and cooperative in NAD Head:   Normocephalic and atraumatic. Eyes:  Sclera clear, no icterus.   Conjunctiva pink. Ears:  Normal auditory acuity. Nose:  No deformity, discharge,  or lesions. Mouth:  No deformity or lesions. No oral thrush- noted dentures ill fitting  Neck:  Supple; no masses or thyromegaly. Lungs:  Clear throughout to auscultation.   No wheezes, crackles, or rhonchi.  Heart:  Regular rate and rhythm; no murmurs, clicks, rubs,  or gallops., Sternal incisional scar healed Abdomen:  Soft,nontender, BS active, abdomen is scaphoid there is no palpable mass or hepatosplenomegaly he also has a lower midline incisional scar  Rectal; not done Msk:  Symmetrical without gross deformities. . Pulses:  Normal pulses noted. Extremities:  Without clubbing or edema. Cachectic with diffuse muscle wasting Neurologic:  Alert and  oriented x3;  grossly normal neurologically. Skin:  Intact without significant lesions or rashes.. Psych:  Alert and cooperative. Normal mood and affect.  Intake/Output from previous day: 09/25 0701 - 09/26 0700 In: 5049 [I.V.:5049] Out: 500 [Urine:500] Intake/Output this shift:    Lab Results:  Basename 10/14/11 0800 10/13/11 1508 10/13/11 1448  WBC 9.0 -- 11.2*  HGB 9.8* 11.6* 11.0*  HCT 29.9* 34.0* 33.3*  PLT 248 -- 266   BMET  Basename 10/14/11 0800 10/13/11 1508 10/13/11 1448 10/12/11 1055  NA 143 147* 144 --  K 3.5 4.4 4.4 --  CL 113* 111 111 --  CO2 25 -- 23 23  GLUCOSE 73 76 80 --  BUN 21 33* 30* --  CREATININE 0.81 1.00 0.91 --  CALCIUM 7.4* -- 8.3* 9.1   LFT  Basename 10/14/11 0800  PROT 4.2*  ALBUMIN 1.5*  AST 18  ALT 10  ALKPHOS 85  BILITOT 0.2*  BILIDIR --  IBILI --   PT/INR  Basename 10/14/11 0800  LABPROT 15.0  INR 1.20     Studies/Results: Dg Chest Portable 1 View  10/13/2011  *RADIOLOGY REPORT*  Clinical Data: Dehydration  PORTABLE CHEST - 1 VIEW  Comparison: 09/03/2011  Findings: Cardiomediastinal silhouette is stable.  Status post median  sternotomy again noted.  No acute infiltrate or pulmonary edema.  Stable old right rib fractures.  IMPRESSION: No active disease.  Status post median sternotomy.  Stable old right rib fractures.   Original Report Authenticated By: Natasha Mead, M.D.     IMPRESSION:  #43  78 year old male with failure to thrive and significant weight loss with malnutrition over the past year. This is multifactorial, occurring after aortic valve replacement, and subsequent complicated course as outlined above. Patient is agreeable to PEG placement, and is an appropriate candidate  #2 recent C. difficile colitis-treated in August 2013-and he is still taking Flagyl once daily. This may be affecting his taste and appetite so we'll discontinue  #3 anorexia-multifactorial as above; patient has been on Pacerone over the past year which can cause anorexia as well, it may be worthwhile discussing with cardiology regarding decreasing dose  or using alternate therapy  #4 history of recurrent prostate cancer status post radiation and subsequent hemorrhagic cystitis now with chronic Foley  #5 history of CVA  #6 history of atrial fibrillation  #7 coronary artery disease  PLAN: Long discussion with patient regarding PEG placement, management and care. He is agreeable to proceed. Will schedule PEG placement for Friday a.m. with Dr. Russella Dar Check protime if not done Stop Flagyl I think consultation with cardiology would be appropriate regarding ongoing Pacerone usage in the setting of severe anorexia, this may be able to be done outpatient.   [name]  10/14/2011, 10:01 AM   I have taken a history, examined the patient and reviewed the chart. I agree with the extender's note, impression and recommendations. Pt and wife had recent office evaluation with Dr. [name] regarding PEG. The patient is now agreeable to proceed with PEG and we will schedule for Friday. Would stop Flagyl and consider stopping or changing  Pacerone  as well. The risks of bleeding, perforation, infection and cardiopulmonary complications with PEG and sedation discussed with the patient and he consents to proceed. Will also discuss the risks with his wife.  Meryl Dare MD [facility]

## 2011-10-14 NOTE — Evaluation (Signed)
Physical Therapy Evaluation Patient Details Name: [name] MRN: [id number] DOB: 1926/06/11 Today's Date: 10/14/2011 Time: 4098-1191 PT Time Calculation (min): 26 min  PT Assessment / Plan / Recommendation Clinical Impression  Pt. was admitted for  placement of PEG . Pt has H/o falls. recent heart valve placed, weight loss. Pt. will benefit from PT to improve in functional mobility and safety to DC to SNF is recommendation.    PT Assessment  Patient needs continued PT services    Follow Up Recommendations  Skilled nursing facility;Supervision/Assistance - 24 hour    Barriers to Discharge Decreased caregiver support      Equipment Recommendations  None recommended by PT    Recommendations for Other Services OT consult   Frequency Min 3X/week    Precautions / Restrictions Precautions Precautions: Fall   Pertinent Vitals/Pain No c/o      Mobility  Bed Mobility Bed Mobility: Supine to Sit;Sit to Supine Supine to Sit: 5: Supervision;HOB flat Sit to Supine: 5: Supervision;HOB flat Transfers Transfers: Sit to Stand;Stand to Sit Sit to Stand: 4: Min assist;With upper extremity assist;From bed Stand to Sit: To bed;With upper extremity assist;4: Min guard Details for Transfer Assistance: VC for use of UE to push off and reach beck Ambulation/Gait Ambulation/Gait Assistance: 4: Min assist Assistive device: Rolling walker Ambulation/Gait Assistance Details: VC to maneuvre RW , stay inside RW, VC to turn safely and go around objects. pt . began to walk away from it. Gait Pattern: Step-through pattern;Decreased stride length;Trunk flexed Gait velocity: decreased    Shoulder Instructions     Exercises     PT Diagnosis: Difficulty walking;Generalized weakness  PT Problem List: Decreased strength;Decreased activity tolerance;Decreased balance;Decreased mobility;Decreased knowledge of use of DME;Decreased skin integrity;Decreased safety awareness PT Treatment Interventions:  DME instruction;Gait training;Functional mobility training;Therapeutic activities;Balance training;Patient/family education   PT Goals Acute Rehab PT Goals PT Goal Formulation: With patient Time For Goal Achievement: 10/28/11 Potential to Achieve Goals: Good Pt will go Sit to Stand: with supervision Pt will go Stand to Sit: with supervision PT Goal: Stand to Sit - Progress: Goal set today Pt will Ambulate: >150 feet;with supervision;with least restrictive assistive device PT Goal: Ambulate - Progress: Goal set today  Visit Information  Last PT Received On: 10/14/11 Assistance Needed: +1    Subjective Data  Subjective: I am here for surgery Patient Stated Goal: to ride my motorcycle   Prior Functioning  Home Living Lives With: Spouse Available Help at Discharge: Family;Available PRN/intermittently Type of Home: House Home Access: Stairs to enter [employer] of Steps: 5 Entrance Stairs-Rails: None Home Layout: One level Bathroom Shower/Tub: Engineer, manufacturing systems: Standard Home Adaptive Equipment: Shower chair with back;Walker - rolling;Straight cane;Bedside commode/3-in-1 Prior Function Level of Independence: Needs assistance Needs Assistance: Light Housekeeping;Meal Prep;Dressing;Bathing Bath: Moderate Dressing: Moderate Meal Prep: Total Light Housekeeping: Total Able to Take Stairs?: Yes Driving: Yes Vocation: Retired Musician: No difficulties Dominant Hand: Right    Cognition  Overall Cognitive Status: No family/caregiver present to determine baseline cognitive functioning Area of Impairment: Memory;Safety/judgement;Awareness of deficits;Executive functioning Arousal/Alertness: Awake/alert Orientation Level: Situation;Time Behavior During Session: [facility] for tasks performed Memory: Decreased recall of precautions    Extremity/Trunk Assessment Right Lower Extremity Assessment RLE ROM/Strength/Tone: WFL for tasks  assessed RLE ROM/Strength/Tone Deficits: grossly Left Lower Extremity Assessment LLE ROM/Strength/Tone: WFL for tasks assessed LLE ROM/Strength/Tone Deficits: grossly   Balance Static Sitting Balance Static Sitting - Balance Support: No upper extremity supported;Feet supported Static Sitting - Level of Assistance: 5:  Stand by assistance  End of Session PT - End of Session Equipment Utilized During Treatment: Gait belt Activity Tolerance: Patient tolerated treatment well Patient left: in bed;with call bell/phone within reach;with bed alarm set Nurse Communication: Mobility status  GP     Rada Hay 10/14/2011, 11:04 AM  [phone]

## 2011-10-15 ENCOUNTER — Encounter (HOSPITAL_COMMUNITY)
Admission: EM | Disposition: A | Discharge status: Home Health | Payer: Self-pay | Source: Home / Self Care | Attending: Family Medicine

## 2011-10-15 ENCOUNTER — Encounter (HOSPITAL_COMMUNITY): Payer: Self-pay | Admitting: Gastroenterology

## 2011-10-15 DIAGNOSIS — B3781 Candidal esophagitis: Secondary | ICD-10-CM

## 2011-10-15 DIAGNOSIS — A0472 Enterocolitis due to Clostridium difficile, not specified as recurrent: Secondary | ICD-10-CM

## 2011-10-15 HISTORY — PX: PEG PLACEMENT: SHX5437

## 2011-10-15 HISTORY — PX: ESOPHAGOGASTRODUODENOSCOPY: SHX5428

## 2011-10-15 LAB — CLOSTRIDIUM DIFFICILE BY PCR: Toxigenic C. Difficile by PCR: POSITIVE — AB

## 2011-10-15 SURGERY — EGD (ESOPHAGOGASTRODUODENOSCOPY)
Anesthesia: Moderate Sedation

## 2011-10-15 MED ORDER — MIDAZOLAM HCL 10 MG/2ML IJ SOLN
INTRAMUSCULAR | Status: AC
  Filled 2011-10-15: qty 2

## 2011-10-15 MED ORDER — OSMOLITE 1.5 CAL PO LIQD
237.0000 mL | Freq: Four times a day (QID) | ORAL | Status: DC
  Administered 2011-10-16 – 2011-10-20 (×11): 237 mL
  Filled 2011-10-15 (×18): qty 237

## 2011-10-15 MED ORDER — MIDAZOLAM HCL 10 MG/2ML IJ SOLN
INTRAMUSCULAR | Status: DC | PRN
  Administered 2011-10-15: 2 mg via INTRAVENOUS
  Administered 2011-10-15: 1 mg via INTRAVENOUS

## 2011-10-15 MED ORDER — OSMOLITE 1.5 CAL PO LIQD
237.0000 mL | Freq: Four times a day (QID) | ORAL | Status: DC
  Filled 2011-10-15 (×8): qty 237

## 2011-10-15 MED ORDER — PANTOPRAZOLE SODIUM 40 MG PO PACK
40.0000 mg | PACK | Freq: Every day | ORAL | Status: DC
  Filled 2011-10-15 (×2): qty 20

## 2011-10-15 MED ORDER — LORAZEPAM 0.5 MG PO TABS
0.5000 mg | ORAL_TABLET | Freq: Once | ORAL | Status: AC
  Administered 2011-10-15: 0.5 mg via ORAL
  Filled 2011-10-15: qty 1

## 2011-10-15 MED ORDER — FLUCONAZOLE 40 MG/ML PO SUSR: 100.0000 mg | Freq: Every day | ORAL | Status: DC

## 2011-10-15 MED ORDER — LIDOCAINE HCL 1 % IJ SOLN
INTRAMUSCULAR | Status: AC
  Filled 2011-10-15: qty 20

## 2011-10-15 MED ORDER — BUTAMBEN-TETRACAINE-BENZOCAINE 2-2-14 % EX AERO
INHALATION_SPRAY | CUTANEOUS | Status: DC | PRN
  Administered 2011-10-15: 1 via TOPICAL

## 2011-10-15 MED ORDER — FENTANYL CITRATE 0.05 MG/ML IJ SOLN
INTRAMUSCULAR | Status: DC | PRN
  Administered 2011-10-15: 25 ug via INTRAVENOUS

## 2011-10-15 MED ORDER — FLUCONAZOLE 100 MG PO TABS
100.0000 mg | ORAL_TABLET | Freq: Every day | ORAL | Status: DC
  Administered 2011-10-15 – 2011-10-20 (×6): 100 mg via ORAL
  Filled 2011-10-15 (×7): qty 1

## 2011-10-15 MED ORDER — OSMOLITE 1.5 CAL PO LIQD
118.0000 mL | Freq: Four times a day (QID) | ORAL | Status: AC
  Administered 2011-10-16 (×3): 118 mL
  Filled 2011-10-15 (×3): qty 237

## 2011-10-15 MED ORDER — FENTANYL CITRATE 0.05 MG/ML IJ SOLN
INTRAMUSCULAR | Status: AC
  Filled 2011-10-15: qty 2

## 2011-10-15 NOTE — Progress Notes (Signed)
[facility] Progress Note  10/15/2011   Subjective: Pt is resting comfortably, in no apparent distress, he tolerated procedure well,   Objective:  Vital signs in last 24 hours: Filed Vitals:   10/15/11 1013 10/15/11 1023 10/15/11 1033 10/15/11 1043  BP: 144/91 164/98 163/97 151/100  Pulse:      Temp: 98.5 F (36.9 C)     TempSrc: Oral     Resp: 23 20 23 18   Height:      Weight:      SpO2: 96% 99% 100% 99%   Weight change:   Intake/Output Summary (Last 24 hours) at 10/15/11 1118 Last data filed at 10/15/11 1041  Gross per 24 hour  Intake    390 ml  Output    554 ml  Net   -164 ml   Lab Results  Component Value Date   HGBA1C 5.2 08/29/2011   HGBA1C 5.6 09/18/2010   Lab Results  Component Value Date   LDLCALC 29 08/29/2011   CREATININE 0.81 10/14/2011    Review of Systems As above, otherwise all reviewed and reported negative  Physical Exam General - awake, cachectic, no distress, cooperative  HEENT - NCAT, MM dry Lungs - BBS, shallow, CTA  CV - normal s1, s2 sounds  Abd - soft,  binder in place,  nondistended, no masses, nontender  Ext - no C/C/E  Lab Results: No results found for this or any previous visit (from the past 24 hour(s)).  Micro Results: Recent Results (from the past 240 hour(s))  URINE CULTURE     Status: Normal   Collection Time   10/08/11  2:40 PM      Component Value Range Status Comment   Colony Count >=100,000 COLONIES/ML   Final    Organism ID, Bacteria Multiple bacterial morphotypes present, none   Final    Organism ID, Bacteria predominant. Suggest appropriate recollection if    Final    Organism ID, Bacteria clinically indicated.   Final     Medications:  Scheduled Meds:   . acidophilus  1 capsule Oral TID WC  . amiodarone  200 mg Oral BID  .  ceFAZolin (ANCEF) IV  1 g Intravenous 60 min Pre-Op  . citalopram  20 mg Oral Daily  . feeding supplement (OSMOLITE 1.5 CAL)  237 mL Per Tube QID  . ferrous sulfate  325 mg Oral  Q breakfast  . fluconazole  100 mg Oral Daily  . levothyroxine  25 mcg Oral QAC breakfast  . megestrol  200 mg Oral Daily  . metroNIDAZOLE  250 mg Oral TID  . mirtazapine  30 mg Oral Daily  . multivitamin with minerals  1 tablet Oral Daily  . pantoprazole  40 mg Oral Q1200  . potassium chloride SA  20 mEq Oral Daily  . thiamine  100 mg Oral Daily  . DISCONTD: aspirin  81 mg Oral Daily  . DISCONTD: feeding supplement  237 mL Oral TID BM  . DISCONTD: fluconazole  100 mg Oral Daily   Continuous Infusions:   . sodium chloride 500 mL (10/15/11 0910)   PRN Meds:.acetaminophen, acetaminophen, diphenoxylate-atropine, ondansetron (ZOFRAN) IV, ondansetron, DISCONTD: butamben-tetracaine-benzocaine, DISCONTD: fentaNYL, DISCONTD: midazolam  Assessment/Plan: Severe malnutrition / Protein-calorie malnutrition, severe  - PEG placed  - dietitian consulted for treatment recs and will proceed with slowly starting feeds at 1/2 can as ordered   Dehydration - improved with gentle IVF hydration, can saline lock IVF today  Esophageal Candidiasis - GI started pt on fluconazole  daily   Hypothyroidism - elevated TSH - pt has had some difficulty taking his oral meds, resuming home synthroid dose  Dysphagia -  PEG   Hypernatremia - improved with hydration  ADENOCARCINOMA, PROSTATE  CORONARY ARTERY DISEASE  GERD - continue PPI therapy  CEREBROVASCULAR ACCIDENT, HX OF  S/P AVR (aortic valve replacement)  Hypertension - stable BPs noted on home meds  Hemorrhagic cystitis - chronic  Atrial fibrillation - pt on pacerone and aspirin per his cardiology team (Hochrein)   LOS: 2 days   Crestina Strike 10/15/2011, 11:18 AM  Cleora Fleet, MD, CDE, FAAFP [facility] [place], [place]  [phone]

## 2011-10-15 NOTE — Interval H&P Note (Signed)
History and Physical Interval Note:  10/15/2011 9:33 AM  [name]  has presented today for surgery, with the diagnosis of Failure to thrive  The various methods of treatment have been discussed with the patient and family. After consideration of risks, benefits and other options for treatment, the patient has consented to  Procedure(s) (LRB) with comments: ESOPHAGOGASTRODUODENOSCOPY (EGD) (N/A) PERCUTANEOUS ENDOSCOPIC GASTROSTOMY (PEG) PLACEMENT (N/A) as a surgical intervention .  The patient's history has been reviewed, patient examined, no change in status, stable for surgery.  I have reviewed the patient's chart and labs.  Questions were answered to the patient's satisfaction.     Venita Lick. Russella Dar MD Clementeen Graham

## 2011-10-15 NOTE — Progress Notes (Signed)
Event: 2045: Notified by RN that pt found down on floor at bedside. Pt reported he slipped and fell attempting to look for his TV control. RN reports no LOC or other obvious injury. NP to bedside. Subjective: Pt denies any pain. Denies hitting his head or LOC. Reports he was attempting to get OOB to find his TV hand control and his feet slipped out from under him as he attempted to stand. Objective: At bedside pt noted awake, alert and oriented x 3. PERRLA, face and head atraumatic. No bony TTP to c-spine, t-spine or l-spine. MAEx4 w/o pain. No TTP to bil hips, pelvis stable. CN II-XII grossly intact. No abrasions or contusions to BUE's or BLE's. Chest wall and back atraumatic. BBS CTA, HR-102 w/ RRR and w/o M/G/R. Abd soft, NT w/ normal BS. VS after fall BP-103/63, P-106, R-23 w/ 02 sats of 97% on L Bascom.  Assessment/Plan: 1. Mechanical fall: No head trauma or LOC. No obvious injury. No focal neuro deficits identified. No imaging indicated.  Bed alarm is on, order placed for pt to be OOB w/ assistance only. Pt is in front of nurses station w/ door open at all times. Will continue to monitor closely.

## 2011-10-15 NOTE — Progress Notes (Addendum)
CRITICAL VALUE ALERT  Critical value received:  Positive C Diff  Date of notification:  10/15/11  Time of notification:  1245  Critical value read back:yes  Nurse who received alert:  Orlene Och RN  MD notified (1st page):  Maryln Manuel  Time of first page: 1246  MD notified (2nd page):  Time of second page:  Responding MD:  Time MD responded:  1300

## 2011-10-15 NOTE — Op Note (Signed)
[facility] [address] [place], [address]   ENDOSCOPY PROCEDURE REPORT  PATIENT: [name], [name]  MR#: [id number] BIRTHDATE: [birth year] , 85  yrs. old GENDER: Male ENDOSCOPIST: Meryl Dare, MD, [facility]  PROCEDURE DATE:  10/15/2011 PROCEDURE:  EGD w/ percutaneous gastrostomy tube placement ASA CLASS:     Class III INDICATIONS:  dysphagia. MEDICATIONS: MAC sedation, administered by CRNA, Fentanyl 25 mcg IV, and Versed 3 mg IV TOPICAL ANESTHETIC: Cetacaine Spray DESCRIPTION OF PROCEDURE: After the risks benefits and alternatives of the procedure were thoroughly explained, informed consent was obtained.  The endoscope [id number] endoscope was introduced through the mouth and advanced to the second portion of the duodenum. Without limitations.  The instrument was slowly withdrawn as the mucosa was fully examined.   ESOPHAGUS: White exudates consistent with candidiasis were found in the middle third of the esophagus.  The esophagus was otherwise normal. DUODENUM: A diverticulum was found in the 2nd part of the duodenum. The duodenal mucosa showed no abnormalities in the duodenal bulb.  STOMACH: The mucosa of the stomach appeared normal. Retroflexed views revealed a small hiatal hernia. 24 fr PEG placed by standard pull techinique after location of a suitable site in the epigastrium and after standard sterile prep and drapping. The scope was then withdrawn from the patient and the procedure completed.  COMPLICATIONS: There were no complications. ENDOSCOPIC IMPRESSION: 1.   White esophageal exudates consistent with candidiasis 2.   Diverticulum was found in the 2nd part of the duodenum 3.   PEG placed 4.   Small hiatal hernia  RECOMMENDATIONS: 1.  Follow PEG recommendations 2.  Diflucan for 7-10 days   eSigned:  Meryl Dare, MD, [facility] Graham 10/15/2011 10:13 AM   XB:JYNW Juanda Chance, MD

## 2011-10-15 NOTE — Care Management Note (Unsigned)
    Page 1 of 1   10/15/2011     3:03:53 PM   CARE MANAGEMENT NOTE 10/15/2011  Patient:  [name], [name]   Account Number:  [id number]  Date Initiated:  10/14/2011  Documentation initiated by:  PEARSON,COOKIE  Subjective/Objective Assessment:   pt admitted with dehydration, confused at times     Action/Plan:   from home   Anticipated DC Date:  10/18/2011   Anticipated DC Plan:  HOME W HOME HEALTH SERVICES      DC Planning Services  CM consult      Choice offered to / List presented to:          [facility] arranged  HH-2 PT  HH-1 RN      [facility] agency  [facility]   Status of service:  In process, will continue to follow Medicare Important Message given?  NA - LOS <3 / Initial given by admissions (If response is "NO", the following Medicare IM given date fields will be blank) Date Medicare IM given:   Date Additional Medicare IM given:    Discharge Disposition:  HOME W HOME HEALTH SERVICES  Per UR Regulation:  Reviewed for med. necessity/level of care/duration of stay  If discussed at Long Length of Stay Meetings, dates discussed:    Comments:  10/15/11 [name] Iwan RN,BSN NCM [phone] PT-SNF.C DIFF+,PEG PLACED TODAY.  10/14/11 MPearson, RN, BSN Chart reviewed. Pt was with [facility]/wife and want to continue with [facility] at discharge. Will need Home Orders for HHRN, HHPT.

## 2011-10-16 DIAGNOSIS — E039 Hypothyroidism, unspecified: Secondary | ICD-10-CM

## 2011-10-16 LAB — COMPREHENSIVE METABOLIC PANEL
ALT: 7 U/L (ref 0–53)
AST: 18 U/L (ref 0–37)
Albumin: 1.5 g/dL — ABNORMAL LOW (ref 3.5–5.2)
Alkaline Phosphatase: 97 U/L (ref 39–117)
Calcium: 7.5 mg/dL — ABNORMAL LOW (ref 8.4–10.5)
GFR calc Af Amer: >90 mL/min (ref >90)
Glucose, Bld: 65 mg/dL — ABNORMAL LOW (ref 70–99)
Potassium: 2.9 mEq/L — ABNORMAL LOW (ref 3.5–5.1)
Sodium: 146 mEq/L — ABNORMAL HIGH (ref 135–145)
Total Protein: 4.2 g/dL — ABNORMAL LOW (ref 6.0–8.3)

## 2011-10-16 MED ORDER — HYDROCODONE-ACETAMINOPHEN 5-325 MG PO TABS
1.0000 | ORAL_TABLET | ORAL | Status: DC | PRN
  Administered 2011-10-16: 1 via ORAL
  Filled 2011-10-16: qty 1

## 2011-10-16 MED ORDER — POTASSIUM CHLORIDE CRYS ER 20 MEQ PO TBCR
40.0000 meq | EXTENDED_RELEASE_TABLET | Freq: Once | ORAL | Status: AC
  Administered 2011-10-16: 40 meq via ORAL
  Filled 2011-10-16: qty 2

## 2011-10-16 MED ORDER — VITAMINS A & D EX OINT
TOPICAL_OINTMENT | CUTANEOUS | Status: AC
  Administered 2011-10-16: 09:00:00
  Filled 2011-10-16: qty 5

## 2011-10-16 MED ORDER — VANCOMYCIN 50 MG/ML ORAL SOLUTION
250.0000 mg | Freq: Four times a day (QID) | ORAL | Status: DC
  Administered 2011-10-16 – 2011-10-20 (×16): 250 mg via ORAL
  Filled 2011-10-16 (×20): qty 5

## 2011-10-16 MED ORDER — DEXTROSE 5 % IV SOLN
INTRAVENOUS | Status: DC
  Administered 2011-10-16 – 2011-10-17 (×2): via INTRAVENOUS

## 2011-10-16 MED ORDER — SACCHAROMYCES BOULARDII 250 MG PO CAPS
250.0000 mg | ORAL_CAPSULE | Freq: Two times a day (BID) | ORAL | Status: DC
  Administered 2011-10-16 – 2011-10-20 (×10): 250 mg via ORAL
  Filled 2011-10-16 (×11): qty 1

## 2011-10-16 NOTE — Progress Notes (Signed)
[facility] Progress Note  10/16/2011   Subjective: Pt resting comfortably, in no distress, he denies any complaints, tolerating feeding so far  Objective:  Vital signs in last 24 hours: Filed Vitals:   10/15/11 1043 10/15/11 1326 10/15/11 2200 10/16/11 0650  BP: 151/100 148/96 129/67 134/85  Pulse:    108  Temp:  98.6 F (37 C) 98.1 F (36.7 C) 97.9 F (36.6 C)  TempSrc:  Axillary Oral   Resp: 18 16 18 16   Height:      Weight:      SpO2: 99% 99% 93% 100%   Weight change:   Intake/Output Summary (Last 24 hours) at 10/16/11 1006 Last data filed at 10/16/11 0840  Gross per 24 hour  Intake    410 ml  Output    552 ml  Net   -142 ml   Lab Results  Component Value Date   HGBA1C 5.2 08/29/2011   HGBA1C 5.6 09/18/2010   Lab Results  Component Value Date   LDLCALC 29 08/29/2011   CREATININE 0.73 10/16/2011    Review of Systems As above, otherwise all reviewed and reported negative  Physical Exam General - awake, cachectic, no distress, cooperative  HEENT - NCAT, MM dry Lungs - BBS, shallow, CTA  CV - normal s1, s2 sounds  Abd - soft, binder in place, nondistended, no masses, nontender PEG site clean and dry Ext - no C/C/E  Lab Results: Results for orders placed during the hospital encounter of 10/13/11 (from the past 24 hour(s))  COMPREHENSIVE METABOLIC PANEL     Status: Abnormal   Collection Time   10/16/11  3:45 AM      Component Value Range   Sodium 146 (*) 135 - 145 mEq/L   Potassium 2.9 (*) 3.5 - 5.1 mEq/L   Chloride 113 (*) 96 - 112 mEq/L   CO2 23  19 - 32 mEq/L   Glucose, Bld 65 (*) 70 - 99 mg/dL   BUN 15  6 - 23 mg/dL   Creatinine, Ser 1.61  0.50 - 1.35 mg/dL   Calcium 7.5 (*) 8.4 - 10.5 mg/dL   Total Protein 4.2 (*) 6.0 - 8.3 g/dL   Albumin 1.5 (*) 3.5 - 5.2 g/dL   AST 18  0 - 37 U/L   ALT 7  0 - 53 U/L   Alkaline Phosphatase 97  39 - 117 U/L   Total Bilirubin 0.2 (*) 0.3 - 1.2 mg/dL   GFR calc non Af Amer 83 (*) >90 mL/min   GFR calc Af  Amer >90  >90 mL/min  MAGNESIUM     Status: Normal   Collection Time   10/16/11  3:45 AM      Component Value Range   Magnesium 1.8  1.5 - 2.5 mg/dL  PHOSPHORUS     Status: Normal   Collection Time   10/16/11  3:45 AM      Component Value Range   Phosphorus 2.8  2.3 - 4.6 mg/dL    Micro Results: Recent Results (from the past 240 hour(s))  URINE CULTURE     Status: Normal   Collection Time   10/08/11  2:40 PM      Component Value Range Status Comment   Colony Count >=100,000 COLONIES/ML   Final    Organism ID, Bacteria Multiple bacterial morphotypes present, none   Final    Organism ID, Bacteria predominant. Suggest appropriate recollection if    Final    Organism ID, Bacteria clinically indicated.  Final   CLOSTRIDIUM DIFFICILE BY PCR     Status: Abnormal   Collection Time   10/15/11  7:54 AM      Component Value Range Status Comment   C difficile by pcr POSITIVE (*) NEGATIVE Final     Medications:  Scheduled Meds:   . acidophilus  1 capsule Oral TID WC  . amiodarone  200 mg Oral BID  . citalopram  20 mg Oral Daily  . feeding supplement (OSMOLITE 1.5 CAL)  118 mL Per Tube QID  . feeding supplement (OSMOLITE 1.5 CAL)  237 mL Per Tube QID  . ferrous sulfate  325 mg Oral Q breakfast  . fluconazole  100 mg Oral Daily  . levothyroxine  25 mcg Oral QAC breakfast  . LORazepam  0.5 mg Oral Once  . mirtazapine  30 mg Oral Daily  . multivitamin with minerals  1 tablet Oral Daily  . potassium chloride SA  20 mEq Oral Daily  . saccharomyces boulardii  250 mg Oral BID  . thiamine  100 mg Oral Daily  . vancomycin  250 mg Oral Q6H  . vitamin A & D      . DISCONTD: feeding supplement  237 mL Oral TID BM  . DISCONTD: feeding supplement (OSMOLITE 1.5 CAL)  237 mL Per Tube QID  . DISCONTD: megestrol  200 mg Oral Daily  . DISCONTD: metroNIDAZOLE  250 mg Oral TID  . DISCONTD: pantoprazole  40 mg Oral Q1200  . DISCONTD: pantoprazole sodium  40 mg Per Tube Daily   Continuous Infusions:     . sodium chloride 500 mL (10/15/11 0910)   PRN Meds:.acetaminophen, acetaminophen, diphenoxylate-atropine, ondansetron (ZOFRAN) IV, ondansetron, DISCONTD: butamben-tetracaine-benzocaine, DISCONTD: fentaNYL, DISCONTD: midazolam  Assessment/Plan: Severe malnutrition / Protein-calorie malnutrition, severe  - PEG placed  - dietitian consulted for treatment recs and will proceed with slowly starting feeds as ordered   Dehydration - improved with gentle IVF hydration   Esophageal Candidiasis - GI started pt on fluconazole daily  For 14 days   Hypothyroidism - elevated TSH - pt has had some difficulty taking his oral meds, suspect he has missed many doses of his thyroid supplement, resuming home synthroid dose but will need to be rechecked outpatient   C. Diff - GI team started on oral vancomycin today  Dysphagia - PEG   Hypernatremia - IVFs   Hypokalemia - replacing   ADENOCARCINOMA, PROSTATE  CORONARY ARTERY DISEASE  GERD - continue PPI therapy  CEREBROVASCULAR ACCIDENT, HX OF  S/P AVR (aortic valve replacement)  Hypertension - stable BPs noted on home meds  Hemorrhagic cystitis - chronic  Atrial fibrillation - pt on pacerone and aspirin per his cardiology team (Hochrein)   LOS: 3 days   Rochelle Nephew 10/16/2011, 10:06 AM   Cleora Fleet, MD, CDE, FAAFP [facility] [place], [place]  [phone]

## 2011-10-16 NOTE — Progress Notes (Signed)
Reviewed and agree with management. Ritter Helsley D. Kavion Mancinas, M.D., FACG  

## 2011-10-16 NOTE — Progress Notes (Signed)
Patient ID: [name], male   DOB: Feb 06, 1926, 78 y.o.   MRN: [id number] [facility] Progress Note  Subjective: Sleeping-awoke to exam and was confused to place-talking about being at home.  Says abdomen sore but no significant pain.  Objective:  Vital signs in last 24 hours: Temp:  [97.9 F (36.6 C)-98.6 F (37 C)] 97.9 F (36.6 C) (09/28 0650) Pulse Rate:  [108] 108  (09/28 0650) Resp:  [8-32] 16  (09/28 0650) BP: (123-164)/(67-100) 134/85 mmHg (09/28 0650) SpO2:  [90 %-100 %] 100 % (09/28 0650) Last BM Date: 10/15/11 General:   Alert,  Well-developed,    in NAD Heart:  Regular rate and rhythm; no murmurs Pulm;clear Abdomen:  Soft,mildly tender at PEG site and nondistended. Normal bowel sounds,  .   Extremities:  Without edema. Neurologic:  Alert and  oriented x2  grossly normal neurologically. Psych:  Alert and cooperative. Normal mood and affect.  Intake/Output from previous day: 09/27 0701 - 09/28 0700 In: 210 [P.O.:60; I.V.:100; IV Piggyback:50] Out: 553 [Urine:550; Stool:3] Intake/Output this shift: Total I/O In: 200 [Other:200] Out: -   Lab Results:  [facility] 10/14/11 0800 10/13/11 1508 10/13/11 1448  WBC 9.0 -- 11.2*  HGB 9.8* 11.6* 11.0*  HCT 29.9* 34.0* 33.3*  PLT 248 -- 266   BMET  Basename 10/16/11 0345 10/14/11 0800 10/13/11 1508 10/13/11 1448  NA 146* 143 147* --  K 2.9* 3.5 4.4 --  CL 113* 113* 111 --  CO2 23 25 -- 23  GLUCOSE 65* 73 76 --  BUN 15 21 33* --  CREATININE 0.73 0.81 1.00 --  CALCIUM 7.5* 7.4* -- 8.3*   LFT  Basename 10/16/11 0345  PROT 4.2*  ALBUMIN 1.5*  AST 18  ALT 7  ALKPHOS 97  BILITOT 0.2*  BILIDIR --  IBILI --   PT/INR  Basename 10/14/11 0800  LABPROT 15.0  INR 1.20     Assessment / Plan: 78 yo male with failure to thrive-multifactoral   With multiple medical problems over the past year since AVR. Stable  s/p PEG placement yesterday Will resume oral diat today, and start PEG feedings with  bolus feedings of OSmolite 1.5  Start with half cans with goal of 6 cans per day with additional free water as outlined by dietician  Wife needs to learn how to care for PEG tube, this weekend  #2  Candida esophagitis- started Diflucan yesterday  #3  Cdiff is positive- he had Cdiff  A  month or so ago-treated with flagyl, and still having some diarrhea will start Vancomycin 250  4 x daily liquid via PEG-, and would continue Florastor as well.Since he was refractory to flagyl would treat with a longer tapered course of vancomycin over 3-4 weeks #4  Hypokalemia-  Needs corrected  Pt will need a follow up with Dr. Juanda Chance in a couple weeks    Principal Problem:  *Hypernatremia Active Problems:  ADENOCARCINOMA, PROSTATE  CORONARY ARTERY DISEASE  GERD  CEREBROVASCULAR ACCIDENT, HX OF  S/P AVR (aortic valve replacement)  Hypertension  Hemorrhagic cystitis  Atrial fibrillation  Protein-calorie malnutrition, severe  Intra-abdominal abscess  Severe malnutrition  Dehydration  Dysphagia  Feeding difficulties and mismanagement  Esophageal candidiasis     LOS: 3 days   [name]  10/16/2011, 9:08 AM

## 2011-10-17 DIAGNOSIS — E876 Hypokalemia: Secondary | ICD-10-CM

## 2011-10-17 DIAGNOSIS — E41 Nutritional marasmus: Secondary | ICD-10-CM

## 2011-10-17 DIAGNOSIS — Z9181 History of falling: Secondary | ICD-10-CM

## 2011-10-17 LAB — BASIC METABOLIC PANEL
BUN: 14 mg/dL (ref 6–23)
Chloride: 108 mEq/L (ref 96–112)
GFR calc non Af Amer: 85 mL/min — ABNORMAL LOW (ref >90)
Glucose, Bld: 153 mg/dL — ABNORMAL HIGH (ref 70–99)
Potassium: 2.9 mEq/L — ABNORMAL LOW (ref 3.5–5.1)
Sodium: 138 mEq/L (ref 135–145)

## 2011-10-17 LAB — PHOSPHORUS: Phosphorus: 1.2 mg/dL — ABNORMAL LOW (ref 2.3–4.6)

## 2011-10-17 MED ORDER — POTASSIUM CHLORIDE CRYS ER 20 MEQ PO TBCR
40.0000 meq | EXTENDED_RELEASE_TABLET | Freq: Three times a day (TID) | ORAL | Status: DC
  Administered 2011-10-17 (×3): 40 meq via ORAL
  Filled 2011-10-17 (×4): qty 2

## 2011-10-17 MED ORDER — POTASSIUM CHLORIDE CRYS ER 20 MEQ PO TBCR
40.0000 meq | EXTENDED_RELEASE_TABLET | Freq: Two times a day (BID) | ORAL | Status: DC
  Filled 2011-10-17 (×2): qty 2

## 2011-10-17 NOTE — Progress Notes (Signed)
Patient's wife has been instructed on how to give tube feedings, and has verbalized an understanding of how to give bolus feeds.  Philomena Doheny RN

## 2011-10-17 NOTE — Progress Notes (Signed)
Patient ID: [name], male   DOB: Jul 21, 1926, 78 y.o.   MRN: [id number] [facility] Progress Note  Subjective: Still a little confused but very pleasant. Tolerating tube feeds without difficulty. Says his" operation" is a little sore- not bad. No diarrhea today thus far.  Objective:  Vital signs in last 24 hours: Temp:  [98.2 F (36.8 C)-98.3 F (36.8 C)] 98.3 F (36.8 C) (09/29 2440) Pulse Rate:  [80-99] 80  (09/29 0638) Resp:  [20] 20  (09/29 1027) BP: (118-138)/(63-82) 118/63 mmHg (09/29 2536) SpO2:  [99 %-100 %] 100 % (09/29 6440) Last BM Date: 10/15/11 General:   Alert,  Well-developed,    in NAD Heart:  Regular rate and rhythm; no murmurs Pulm;clear  Abdomen:  Soft, mildly tender at PEG site and nondistended. Normal bowel sounds, without guarding,   Extremities:  Without edema. Neurologic:  Alert and  oriented x2   Psych:  Alert and cooperative. Normal mood and affect.  Intake/Output from previous day: 09/28 0701 - 09/29 0700 In: 400  Out: 450 [Urine:450] Intake/Output this shift:    Lab Results: No results found for this basename: WBC:3,HGB:3,HCT:3,PLT:3 in the last 72 hours BMET  Basename 10/17/11 0441 10/16/11 0345  NA 138 146*  K 2.9* 2.9*  CL 108 113*  CO2 24 23  GLUCOSE 153* 65*  BUN 14 15  CREATININE 0.70 0.73  CALCIUM 7.2* 7.5*    Assessment / Plan: #1   78 yo male with Failure to thrive- anorexia and weight loss with multiple comorbidities. Stable s/p PEG placement and tolerating tube feedings-advance gradually to goal,and also allow po feedings as he tolerates. #2 Hypokalemia-needs corrected #3 persistent C.Diff colitis-refractory to Flagyl- now on Vanco suspension per tube-will need a 3-4 week tapered course, and continue florastor as well BID #4 Candida esophagitis- day #3 Diflucan  Would anticipate discharge tomorrow- and will need follow up with Dr. Juanda Chance scheduled  Principal Problem:  *Hypernatremia Active Problems:  ADENOCARCINOMA, PROSTATE  CORONARY ARTERY DISEASE  GERD  CEREBROVASCULAR ACCIDENT, HX OF  S/P AVR (aortic valve replacement)  Hypertension  Hemorrhagic cystitis  Atrial fibrillation  Protein-calorie malnutrition, severe  Intra-abdominal abscess  Severe malnutrition  Dehydration  Dysphagia  Feeding difficulties and mismanagement  Esophageal candidiasis     LOS: 4 days   [name]  10/17/2011, 8:35 AM

## 2011-10-17 NOTE — Progress Notes (Signed)
[facility] Progress Note  10/17/2011   Subjective: Pt tolerating tube feeds so far.  Pt is reporting that he slept well.  He denies abdominal pain.   Objective:  Vital signs in last 24 hours: Filed Vitals:   10/16/11 0650 10/16/11 1442 10/16/11 2259 10/17/11 0638  BP: 134/85 121/70 138/82 118/63  Pulse: 108 99 91 80  Temp: 97.9 F (36.6 C) 98.2 F (36.8 C) 98.2 F (36.8 C) 98.3 F (36.8 C)  TempSrc:  Axillary Oral Oral  Resp: 16 20 20 20   Height:      Weight:      SpO2: 100% 99% 100% 100%   Weight change:   Intake/Output Summary (Last 24 hours) at 10/17/11 0857 Last data filed at 10/17/11 8119  Gross per 24 hour  Intake    200 ml  Output    450 ml  Net   -250 ml   Lab Results  Component Value Date   HGBA1C 5.2 08/29/2011   HGBA1C 5.6 09/18/2010   Lab Results  Component Value Date   LDLCALC 29 08/29/2011   CREATININE 0.70 10/17/2011    Review of Systems As above, otherwise all reviewed and reported negative  Physical Exam General - awake, cachectic, no distress, cooperative  HEENT - NCAT, MM dry Lungs - BBS, shallow, CTA  CV - normal s1, s2 sounds  Abd - soft, binder in place, nondistended, no masses, nontender PEG site clean and dry  Ext - no C/C/E  Lab Results: Results for orders placed during the hospital encounter of 10/13/11 (from the past 24 hour(s))  BASIC METABOLIC PANEL     Status: Abnormal   Collection Time   10/17/11  4:41 AM      Component Value Range   Sodium 138  135 - 145 mEq/L   Potassium 2.9 (*) 3.5 - 5.1 mEq/L   Chloride 108  96 - 112 mEq/L   CO2 24  19 - 32 mEq/L   Glucose, Bld 153 (*) 70 - 99 mg/dL   BUN 14  6 - 23 mg/dL   Creatinine, Ser 1.47  0.50 - 1.35 mg/dL   Calcium 7.2 (*) 8.4 - 10.5 mg/dL   GFR calc non Af Amer 85 (*) >90 mL/min   GFR calc Af Amer >90  >90 mL/min  MAGNESIUM     Status: Normal   Collection Time   10/17/11  4:41 AM      Component Value Range   Magnesium 1.7  1.5 - 2.5 mg/dL  PHOSPHORUS     Status:  Abnormal   Collection Time   10/17/11  4:41 AM      Component Value Range   Phosphorus 1.2 (*) 2.3 - 4.6 mg/dL    Micro Results: Recent Results (from the past 240 hour(s))  URINE CULTURE     Status: Normal   Collection Time   10/08/11  2:40 PM      Component Value Range Status Comment   Colony Count >=100,000 COLONIES/ML   Final    Organism ID, Bacteria Multiple bacterial morphotypes present, none   Final    Organism ID, Bacteria predominant. Suggest appropriate recollection if    Final    Organism ID, Bacteria clinically indicated.   Final   CLOSTRIDIUM DIFFICILE BY PCR     Status: Abnormal   Collection Time   10/15/11  7:54 AM      Component Value Range Status Comment   C difficile by pcr POSITIVE (*) NEGATIVE Final  Medications:  Scheduled Meds:   . acidophilus  1 capsule Oral TID WC  . amiodarone  200 mg Oral BID  . citalopram  20 mg Oral Daily  . feeding supplement (OSMOLITE 1.5 CAL)  118 mL Per Tube QID  . feeding supplement (OSMOLITE 1.5 CAL)  237 mL Per Tube QID  . ferrous sulfate  325 mg Oral Q breakfast  . fluconazole  100 mg Oral Daily  . levothyroxine  25 mcg Oral QAC breakfast  . mirtazapine  30 mg Oral Daily  . multivitamin with minerals  1 tablet Oral Daily  . potassium chloride  40 mEq Oral Once  . potassium chloride SA  40 mEq Oral TID  . saccharomyces boulardii  250 mg Oral BID  . thiamine  100 mg Oral Daily  . vancomycin  250 mg Oral Q6H  . DISCONTD: megestrol  200 mg Oral Daily  . DISCONTD: metroNIDAZOLE  250 mg Oral TID  . DISCONTD: pantoprazole sodium  40 mg Per Tube Daily  . DISCONTD: potassium chloride SA  20 mEq Oral Daily  . DISCONTD: potassium chloride SA  40 mEq Oral BID   Continuous Infusions:   . dextrose 75 mL/hr at 10/17/11 0122  . DISCONTD: sodium chloride 500 mL (10/15/11 0910)   PRN Meds:.acetaminophen, acetaminophen, diphenoxylate-atropine, HYDROcodone-acetaminophen, ondansetron (ZOFRAN) IV,  ondansetron  Assessment/Plan: Severe malnutrition / Protein-calorie malnutrition, severe  - PEG placed, tolerating TF so far - dietitian consulted for treatment recs and will proceed with slowly starting feeds as ordered  - possible DC tomorrow  Dehydration - improved with gentle IVF hydration, decreasing rate of IVFs today as TF are increasing  Esophageal Candidiasis - GI started pt on fluconazole daily per tube For 14 days   Hypothyroidism - elevated TSH - pt has had some difficulty taking his oral meds, suspect he has missed many doses of his thyroid supplement, resuming home synthroid dose but will need to be rechecked outpatient   C. Diff - GI team started on oral vancomycin today   Dysphagia - PEG   Hypernatremia - resolved now  Hypokalemia - replacing with kdur TID through tube as ordered, recheck bmp in AM  ADENOCARCINOMA, PROSTATE  CORONARY ARTERY DISEASE  GERD - continue PPI therapy  CEREBROVASCULAR ACCIDENT, HX OF  S/P AVR (aortic valve replacement)   Hypertension - stable BPs noted on home meds   Hemorrhagic cystitis - chronic   Atrial fibrillation - pt on pacerone and aspirin per his cardiology team (Hochrein)   LOS: 4 days   [name] 10/17/2011, 8:57 AM  Cleora Fleet, MD, CDE, FAAFP [facility] [place], [place]  [phone]

## 2011-10-17 NOTE — Progress Notes (Signed)
History of Present Illness:      Review of Systems: Pertinent positive and negative review of systems were noted in the above HPI section. All other review of systems were otherwise negative.    Current Medications, Allergies, Past Medical History, Past Surgical History, Family History and Social History were reviewed in [facility] electronic medical record  Vital signs were reviewed in today's medical record. Physical Exam: General: Well developed , well nourished, no acute distress Head: Normocephalic and atraumatic Eyes:  sclerae anicteric, EOMI Ears: Normal auditory acuity Mouth: No deformity or lesions Lungs: Clear throughout to auscultation Heart: Regular rate and rhythm; no murmurs, rubs or bruits Abdomen: Soft, non tender and non distended. No masses, hepatosplenomegaly or hernias noted. Normal Bowel sounds Rectal:deferred Musculoskeletal: Symmetrical with no gross deformities  Pulses:  Normal pulses noted Extremities: No clubbing, cyanosis, edema or deformities noted Neurological: Alert oriented x 4, grossly nonfocal Psychological:  Alert and cooperative. Normal mood and affect    

## 2011-10-17 NOTE — Progress Notes (Signed)
Nutrition Follow-up  Intervention:    Continue monitor of pt's potassium, magnesium and phosphorus.  Continue with current TF regimen: 1/2 can of Osmolite 1.5 every 4 hours as tolerated increasing to 1 can every 4 hours as tolerated. Goal is 6 cans of Osmolite 1.5 daily via PEG which will provide 2130 kcal, 89 gm protein, and 1086 mL free water.   Continue 60ml water flushes before and after each bolus TF can and an additional 120 mL water flushes 3 times/day to meet all of pt's estimated fluid needs.   Encourage PO intake as tolerated.  RD to follow.  Assessment:   Pt at risk for refeeding syndrome. PEG placed 10/15/11. Pt tolerating TF- 400 mL on 10/16/11.  Gastric residuals: 0 mL BM x2 10/16/11.  Diet Order:  Dysphagia 3, thin liquids (no PO intake documented)  Meds: Scheduled Meds:    . acidophilus  1 capsule Oral TID WC  . amiodarone  200 mg Oral BID  . citalopram  20 mg Oral Daily  . feeding supplement (OSMOLITE 1.5 CAL)  118 mL Per Tube QID  . feeding supplement (OSMOLITE 1.5 CAL)  237 mL Per Tube QID  . ferrous sulfate  325 mg Oral Q breakfast  . fluconazole  100 mg Oral Daily  . levothyroxine  25 mcg Oral QAC breakfast  . mirtazapine  30 mg Oral Daily  . multivitamin with minerals  1 tablet Oral Daily  . potassium chloride  40 mEq Oral Once  . potassium chloride SA  40 mEq Oral TID  . saccharomyces boulardii  250 mg Oral BID  . thiamine  100 mg Oral Daily  . vancomycin  250 mg Oral Q6H  . DISCONTD: potassium chloride SA  20 mEq Oral Daily  . DISCONTD: potassium chloride SA  40 mEq Oral BID   Continuous Infusions:    . dextrose 35 mL/hr at 10/17/11 0905  . DISCONTD: sodium chloride 500 mL (10/15/11 0910)   PRN Meds:.acetaminophen, acetaminophen, diphenoxylate-atropine, HYDROcodone-acetaminophen, ondansetron (ZOFRAN) IV, ondansetron  Labs:  CMP     Component Value Date/Time   NA 138 10/17/2011 0441   K 2.9* 10/17/2011 0441   CL 108 10/17/2011 0441   CO2 24  10/17/2011 0441   GLUCOSE 153* 10/17/2011 0441   BUN 14 10/17/2011 0441   CREATININE 0.70 10/17/2011 0441   CREATININE 1.05 01/29/2011 1708   CALCIUM 7.2* 10/17/2011 0441   PROT 4.2* 10/16/2011 0345   ALBUMIN 1.5* 10/16/2011 0345   AST 18 10/16/2011 0345   ALT 7 10/16/2011 0345   ALKPHOS 97 10/16/2011 0345   BILITOT 0.2* 10/16/2011 0345   GFRNONAA 85* 10/17/2011 0441   GFRAA >90 10/17/2011 0441  Phosphorus: 1.2 (9/29), 2.8 (9/28) Magnesium: 1.7 (9/29), 1.8 (9/28)   Intake/Output Summary (Last 24 hours) at 10/17/11 0920 Last data filed at 10/17/11 1610  Gross per 24 hour  Intake    200 ml  Output    450 ml  Net   -250 ml    Weight Status:  61.1 kg, slightly increased during admission  Estimated needs:  Kcal: 1850-2250  Protein: 90-110g  Fluid: 1.8-2.2L  Nutrition Dx:  Inadequate oral intake (NI-2.1). Status: Ongoing  Goal:  TF to meet >90% of estimated nutritional needs.  Monitor:  Labs, weight, PO intake   Reilley Valentine, Trinda Pascal RD, LDN

## 2011-10-18 ENCOUNTER — Encounter (HOSPITAL_COMMUNITY): Payer: Self-pay | Admitting: Gastroenterology

## 2011-10-18 LAB — PHOSPHORUS: Phosphorus: 1 mg/dL — CL (ref 2.3–4.6)

## 2011-10-18 LAB — BASIC METABOLIC PANEL
CO2: 24 mEq/L (ref 19–32)
Calcium: 7.1 mg/dL — ABNORMAL LOW (ref 8.4–10.5)
GFR calc non Af Amer: 86 mL/min — ABNORMAL LOW (ref >90)
Potassium: 4 mEq/L (ref 3.5–5.1)
Sodium: 136 mEq/L (ref 135–145)

## 2011-10-18 LAB — MAGNESIUM: Magnesium: 1.8 mg/dL (ref 1.5–2.5)

## 2011-10-18 MED ORDER — FERROUS SULFATE 325 (65 FE) MG PO TABS: 325.0000 mg | ORAL_TABLET | Freq: Every day | ORAL | Status: DC

## 2011-10-18 MED ORDER — K PHOS MONO-SOD PHOS DI & MONO 155-852-130 MG PO TABS: 1.0000 | ORAL_TABLET | Freq: Four times a day (QID) | ORAL | Status: DC

## 2011-10-18 MED ORDER — SACCHAROMYCES BOULARDII 250 MG PO CAPS: 250.0000 mg | ORAL_CAPSULE | Freq: Two times a day (BID) | ORAL | Status: DC

## 2011-10-18 MED ORDER — VANCOMYCIN 50 MG/ML ORAL SOLUTION: 125.0000 mg | Freq: Four times a day (QID) | ORAL | Status: DC

## 2011-10-18 MED ORDER — OSMOLITE 1.5 CAL PO LIQD: 237.0000 mL | Freq: Four times a day (QID) | ORAL | Status: DC

## 2011-10-18 MED ORDER — FLUCONAZOLE 100 MG PO TABS: 100.0000 mg | ORAL_TABLET | Freq: Every day | ORAL | Status: DC

## 2011-10-18 MED ORDER — POTASSIUM PHOSPHATE DIBASIC 3 MMOLE/ML IV SOLN
20.0000 mmol | Freq: Once | INTRAVENOUS | Status: AC
  Administered 2011-10-18: 20 mmol via INTRAVENOUS
  Filled 2011-10-18: qty 6.67

## 2011-10-18 NOTE — Progress Notes (Signed)
Physical Therapy Treatment Patient Details Name: [name] MRN: [id number] DOB: 02/22/1926 Today's Date: 10/18/2011 Time: 1315-1340 PT Time Calculation (min): 25 min  PT Assessment / Plan / Recommendation Comments on Treatment Session  Pt appears to have had some functional decline since last visit. He will need 24/7 assist of +2 people for safety in transfers and continued PT to return to ambuation.  He will need continued PT in SNF setting for maximal safety and functional improvement to decrease burden of care to prepare to d/c to home with wife    Follow Up Recommendations  Post acute inpatient rehab;Supervision/Assistance - 24 hour (SNF)    Barriers to Discharge        Equipment Recommendations  None recommended by PT    Recommendations for Other Services    Frequency Min 3X/week   Plan Discharge plan remains appropriate;Frequency remains appropriate    Precautions / Restrictions Precautions Precautions: Fall;Other (comment) (new peg tube placed.) Precaution Comments: Pt with new peg tube.  Binder was laying under pt, but not fastened.  Refastened binder and educated wife how to do this. Restrictions Weight Bearing Restrictions: No   Pertinent Vitals/Pain Pt c/o some pain in abdomen with rolling    Mobility  Bed Mobility Bed Mobility: Rolling Right;Rolling Left Rolling Right: 4: Min assist Rolling Left: 4: Min assist Supine to Sit: HOB flat;With rails;3: Mod assist Sitting - Scoot to Edge of Bed: 2: Max assist;Other (comment) (pt unable to do w/o therapist pulling on chucks pad.) Sit to Supine: 2: Max assist Details for Bed Mobility Assistance: pt unable to control descent of upper body or assist with raising lower body up onto bed Transfers Transfers: Sit to Stand;Stand to Sit;Squat Pivot Transfers Sit to Stand: 2: Max assist;From chair/3-in-1 (pt had much difficulty placing left arm to assist him) Stand to Sit: 2: Max assist Squat Pivot Transfers: 2: Max  assist Details for Transfer Assistance: pt was unable to assist with left arm. He needed assist to lift off to attempt to stand  and pivot back to bed from chair.  Pt was not able to assist with control of descent.  Pt unable to use RW to assist him Ambulation/Gait Ambulation/Gait Assistance: Not tested (comment) (unable today)    Exercises General Exercises - Lower Extremity Short Arc QuadBarbaraann [name];Both;10 reps;Supine Hip ABduction/ADduction: AAROM;Both;5 reps;Supine Hip Flexion/Marching: AAROM;Both;5 reps;Supine Other Exercises Other Exercises: Control of leg in hip flexed/knee flexed/ foot flat position.  Controlled leg abd/adduction from that position.x 10 reps Other Exercises: Bridging x 5 tirals   PT Diagnosis:    PT Problem List:   PT Treatment Interventions:     PT Goals Acute Rehab PT Goals PT Goal: Sit to Stand - Progress: Not progressing PT Goal: Stand to Sit - Progress: Not progressing PT Goal: Ambulate - Progress: Not progressing  Visit Information  Last PT Received On: 10/18/11 Assistance Needed: +2    Subjective Data  Subjective: "I gotta get back to bed!!" Pt impulsively trying to stand up to get back to bed.  Wife trying to get patient to sit until help will come, but he would not listen to her or wait until I was able to apply contact gown and gloves. Pt needed much encouragement to stay seated until I was able to help him Patient Stated Goal: "I want to go home!"   Cognition  Overall Cognitive Status: History of cognitive impairments - at baseline Area of Impairment: Memory;Safety/judgement;Awareness of errors;Awareness of deficits Arousal/Alertness: Awake/alert Orientation Level:  Disoriented to;Time Behavior During Session: Anxious Memory: Decreased recall of precautions Memory Deficits: Pt's wife reports pt with poor memory for last 6 months or so. Safety/Judgement: Decreased awareness of safety precautions;Impulsive;Decreased awareness of need for  assistance Safety/Judgement - Other Comments: Pt stated if he falls at home he can just crawl to his chair to get up.  That is what he always does. Awareness of Errors: Assistance required to correct errors made;Assistance required to identify errors made Awareness of Errors - Other Comments: Pt is not aware of difficulty  moving left side Awareness of Deficits: Pt not aware of severity of his physical status.  He feels he can get up on his own.  He says he is used to falling all the tie. Cognition - Other Comments: Pt has had long history of falls.  Wife states 15 or so falls EACH MONTH for the last 6 months.  Pt need to consider ALF.    Balance  Balance Balance Assessed: No Static Sitting Balance Static Sitting - Balance Support: Bilateral upper extremity supported;Feet supported Static Sitting - Level of Assistance: 3: Mod assist Static Sitting - Comment/# of Minutes: 3  End of Session PT - End of Session Activity Tolerance: Patient limited by fatigue Patient left: in bed;with call bell/phone within reach;with bed alarm set;with family/visitor present Nurse Communication: Mobility status   GP    [name], [place] [phone] 10/18/2011, 2:03 PM

## 2011-10-18 NOTE — Progress Notes (Signed)
CARE MANAGEMENT NOTE 10/18/2011  Patient:  [name], [name]   Account Number:  [id number]  Date Initiated:  10/14/2011  Documentation initiated by:  PEARSON,COOKIE  Subjective/Objective Assessment:   pt admitted with dehydration, confused at times     Action/Plan:   from home   Anticipated DC Date:  10/18/2011   Anticipated DC Plan:  HOME W HOME HEALTH SERVICES      DC Planning Services  CM consult      Choice offered to / List presented to:          [facility] arranged  HH-2 PT  HH-1 RN      [facility] agency  [facility]   Status of service:  In process, will continue to follow Medicare Important Message given?  NA - LOS <3 / Initial given by admissions (If response is "NO", the following Medicare IM given date fields will be blank) Date Medicare IM given:   Date Additional Medicare IM given:    Discharge Disposition:  HOME W HOME HEALTH SERVICES  Per UR Regulation:  Reviewed for med. necessity/level of care/duration of stay  If discussed at Long Length of Stay Meetings, dates discussed:    Comments:  [id number]/Hari Casaus,Rn,BSN,CCM: Orders for hhc and labs received tct-Kristen with [facility] understands date and instruction for labs/hhc needed in place/Deray Dawes,RN,BSN,CCM   10/15/11 KATHY MAHABIR RN,BSN NCM [phone] PT-SNF.C DIFF+,PEG PLACED TODAY.  10/14/11 MPearson, RN, BSN Chart reviewed. Pt was with [facility]/wife and want to continue with [facility] at discharge. Will need Home Orders for HHRN, HHPT.

## 2011-10-18 NOTE — Progress Notes (Signed)
Met with patient and wife to discuss SNF option- Patient is adamantly refusing SNF- states, "I'd rather die". He apparently has been to SNF in the past and had a very bad experience. I spent >15 minutes trying to talk him into considering and he is currently still refusing. I will f/u again tomorrow with him- also updated PT who plans to see him tomorrow to further assess (maybe he will be motivated to do more/better with PT) and I will again attempt SNF option. Reece Levy, MSW, Theresia Majors [phone]

## 2011-10-18 NOTE — Progress Notes (Signed)
[facility] Progress Note  10/18/2011   Subjective: Pt without complaints today.    Objective:  Vital signs in last 24 hours: Filed Vitals:   10/17/11 0638 10/17/11 1447 10/17/11 2135 10/18/11 0442  BP: 118/63 122/79 122/67 124/69  Pulse: 80 87 83 81  Temp: 98.3 F (36.8 C) 98.1 F (36.7 C) 97.6 F (36.4 C) 98 F (36.7 C)  TempSrc: Oral Oral Oral Oral  Resp: 20 20 16 18   Height:      Weight:      SpO2: 100% 100% 99% 100%   Weight change:   Intake/Output Summary (Last 24 hours) at 10/18/11 0815 Last data filed at 10/18/11 0443  Gross per 24 hour  Intake      0 ml  Output    250 ml  Net   -250 ml   Lab Results  Component Value Date   HGBA1C 5.2 08/29/2011   HGBA1C 5.6 09/18/2010   Lab Results  Component Value Date   LDLCALC 29 08/29/2011   CREATININE 0.68 10/18/2011    Review of Systems As above, otherwise all reviewed and reported negative  Physical Exam General - awake, cachectic, no distress, cooperative  HEENT - NCAT, MM dry Lungs - BBS, shallow, CTA  CV - normal s1, s2 sounds  Abd - soft, binder in place, nondistended, no masses, nontender PEG site clean and dry  Ext - no C/C/E  Lab Results: Results for orders placed during the hospital encounter of 10/13/11 (from the past 24 hour(s))  BASIC METABOLIC PANEL     Status: Abnormal   Collection Time   10/18/11  3:32 AM      Component Value Range   Sodium 136  135 - 145 mEq/L   Potassium 4.0  3.5 - 5.1 mEq/L   Chloride 106  96 - 112 mEq/L   CO2 24  19 - 32 mEq/L   Glucose, Bld 117 (*) 70 - 99 mg/dL   BUN 15  6 - 23 mg/dL   Creatinine, Ser 1.61  0.50 - 1.35 mg/dL   Calcium 7.1 (*) 8.4 - 10.5 mg/dL   GFR calc non Af Amer 86 (*) >90 mL/min   GFR calc Af Amer >90  >90 mL/min    Micro Results: Recent Results (from the past 240 hour(s))  URINE CULTURE     Status: Normal   Collection Time   10/08/11  2:40 PM      Component Value Range Status Comment   Colony Count >=100,000 COLONIES/ML   Final      Organism ID, Bacteria Multiple bacterial morphotypes present, none   Final    Organism ID, Bacteria predominant. Suggest appropriate recollection if    Final    Organism ID, Bacteria clinically indicated.   Final   CLOSTRIDIUM DIFFICILE BY PCR     Status: Abnormal   Collection Time   10/15/11  7:54 AM      Component Value Range Status Comment   C difficile by pcr POSITIVE (*) NEGATIVE Final     Medications:  Scheduled Meds:   . acidophilus  1 capsule Oral TID WC  . amiodarone  200 mg Oral BID  . citalopram  20 mg Oral Daily  . feeding supplement (OSMOLITE 1.5 CAL)  237 mL Per Tube QID  . ferrous sulfate  325 mg Oral Q breakfast  . fluconazole  100 mg Oral Daily  . levothyroxine  25 mcg Oral QAC breakfast  . mirtazapine  30 mg Oral Daily  .  multivitamin with minerals  1 tablet Oral Daily  . potassium chloride SA  40 mEq Oral TID  . saccharomyces boulardii  250 mg Oral BID  . thiamine  100 mg Oral Daily  . vancomycin  250 mg Oral Q6H  . DISCONTD: potassium chloride SA  20 mEq Oral Daily  . DISCONTD: potassium chloride SA  40 mEq Oral BID   Continuous Infusions:   . dextrose 35 mL/hr at 10/17/11 0905   PRN Meds:.acetaminophen, acetaminophen, diphenoxylate-atropine, HYDROcodone-acetaminophen, ondansetron (ZOFRAN) IV, ondansetron  Assessment/Plan: Severe malnutrition / Protein-calorie malnutrition, severe  - PEG placed, tolerating TF so far  - dietitian consulted for treatment recs and will proceed with slowly starting feeds as ordered  -  DC home - I had a long conversation with pt's wife today and she says that she feels comfortable with giving tube feeds.  She says that she would like to continue to have [facility] for home health.  She is not interested in SNF at this time.  She says she would like to care for patient at home.  When she had him at a SNF in the past the patient resented being there and made her feel very guilty.    Dehydration - improved with gentle  IVF hydration, DC IVFs  Esophageal Candidiasis - GI started pt on fluconazole daily per tube For 14 days total  Hypothyroidism - elevated TSH - pt has had some difficulty taking his oral meds, suspect he has missed many doses of his thyroid supplement, resuming home synthroid dose but will need to be rechecked outpatient   C. Diff - GI team started on oral vancomycin to continue to take for 3 weeks  Dysphagia - PEG   Hypernatremia - resolved now   Hypokalemia - repleted with kdur TID through tube  ADENOCARCINOMA, PROSTATE  CORONARY ARTERY DISEASE  GERD - continue PPI therapy  CEREBROVASCULAR ACCIDENT, HX OF  S/P AVR (aortic valve replacement)  Hypertension - stable BPs noted on home meds  Hemorrhagic cystitis - chronic  Atrial fibrillation - pt on pacerone and aspirin per his cardiology team (Hochrein)   LOS: 5 days   Karielle Davidow 10/18/2011, 8:15 AM   Cleora Fleet, MD, CDE, FAAFP [facility] [place], [place]  [phone]

## 2011-10-18 NOTE — Progress Notes (Signed)
[name]  Subjective:  Feeling well.  Still tolerating tube feeds.  Confused, but pleasant.  2 stools yesterday, none yet today.  Objective:  Vital signs in last 24 hours: Temp:  [97.6 F (36.4 C)-98.1 F (36.7 C)] 98 F (36.7 C) (09/30 0442) Pulse Rate:  [81-87] 81  (09/30 0442) Resp:  [16-20] 18  (09/30 0442) BP: (122-124)/(67-79) 124/69 mmHg (09/30 0442) SpO2:  [99 %-100 %] 100 % (09/30 0442) Last BM Date: 10/15/11 General:   Alert, thin, in NAD Heart:  Regular rate and rhythm; no murmurs Pulm:  CTAB.  No W/R/R. Abdomen:  Soft, nontender and nondistended.  Bowel sounds present.  PEG in place. Extremities:  Without edema. Neurologic:  Alert; grossly normal neurologically. Psych:  Alert and cooperative. Normal mood and affect.  Intake/Output from previous day: 09/29 0701 - 09/30 0700 In: -  Out: 250 [Urine:250]  BMET  Basename 10/18/11 0332 10/17/11 0441 10/16/11 0345  NA 136 138 146*  K 4.0 2.9* 2.9*  CL 106 108 113*  CO2 24 24 23   GLUCOSE 117* 153* 65*  BUN 15 14 15   CREATININE 0.68 0.70 0.73  CALCIUM 7.1* 7.2* 7.5*   LFT  Basename 10/16/11 0345  PROT 4.2*  ALBUMIN 1.5*  AST 18  ALT 7  ALKPHOS 97  BILITOT 0.2*  BILIDIR --  IBILI --   Assessment / Plan: #1 78 yo male with Failure to thrive- anorexia and weight loss with multiple comorbidities.  Stable s/p PEG placement and tolerating tube feedings-advance gradually to goal, and also allow po feedings as he tolerates.  #2 Hypokalemia-has been corrected.  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  #4 Candida esophagitis- Day #4/14 of Diflucan.   For possible discharge today.  Follow-up with Dr. Juanda Chance scheduled.    LOS: 5 days   Cloma Rahrig D.  10/18/2011, 8:06 AM  Pager number [phone]

## 2011-10-18 NOTE — Significant Event (Signed)
CRITICAL VALUE ALERT  Critical value received:  phosphorus  Date of notification:  10-18-11  Time of notification:  0910  Critical value read back:yes  Nurse who received alert:  Bernita Buffy, RN  MD notified (1st page):  Maryln Manuel  Time of first page:  0910  MD notified (2nd page):  Time of second page:  Responding MD:  Maryln Manuel  Time MD responded: [phone]

## 2011-10-18 NOTE — Progress Notes (Signed)
I agree with the assessment and plan as described by Ms. Cristi Loron. Iva Boop, MD, [facility] [phone] (pager) 10/18/2011 10:29 AM

## 2011-10-18 NOTE — Discharge Summary (Signed)
Physician Discharge Summary  Patient ID: MAEL MILCH MRN: [id number] DOB/AGE: 05-11-1926 78 y.o.  Admit date: 10/13/2011 Discharge date: 10/18/2011  Gastroenterologist: Dr. Diamond Nickel PCP: Dr. Oliver Barre  Discharge Diagnoses:        S/p PEG placement    Esophageal candidiasis   Hypernatremia - resolved   C. Difficile colitis infection  ADENOCARCINOMA, PROSTATE  CORONARY ARTERY DISEASE  GERD  CEREBROVASCULAR ACCIDENT, HX OF  S/P AVR (aortic valve replacement)  Hypertension  Hemorrhagic cystitis  Atrial fibrillation  Protein-calorie malnutrition, severe  Intra-abdominal abscess  Severe malnutrition  Dehydration - treated  Dysphagia  Feeding difficulties and mismanagement  Recommendations for outpatient follow up:   Please check metabolic panel, Mg, Phos : Pt is at high risk for refeeding syndrome, Please follow up elevated TSH, Please follow up treatment for C. Diff  Home Health to draw labs on 10/20/11 and 10/22/11 and call report to Dr. Juanda Chance and Dr. [name]  Discharged Condition: stable  Hospital Course:  From H&P: [name] is an 78 y.o. male with multiple chronic comorbidities as detailed below who was sent to the emergency department by his primary gastroenterologist for PICC placement. The patient has no appetite and has not been consuming enough fluids to sustain himself at home. He is severely dehydrated. He has become hypernatremic and started to develop some renal insufficiency. The patient has been working closely with his primary care physician and gastroenterologist and the family has made the decision that they would like to pursue PEG placement. The patient reports that he has no appetite and he is severely fatigued and has no interest in eating. He also has difficulty swallowing his pills at home. His wife reports that she has a difficult time getting him to take his medications. The PEG tube will help with medication administration as well as assist with treatment  of severe malnutrition. Patient has recently completed hyperbaric oxygen therapy for chronic gross hematuria with significant blood loss anemia. The patient reports that recently he noticed that he's starting to experience gross hematuria. He has a chronic indwelling Foley catheter.  Severe malnutrition / Protein-calorie malnutrition, severe  - PEG placed, tolerating TF so far  - dietitian consulted for treatment recs and will proceed with slowly starting feeds as ordered  - DC home with Home Health  - I had a long conversation with pt's wife today and she says that she feels comfortable with giving tube feeds. She says that she would like to continue to have [facility] for home health. She is not interested in SNF at this time. She says she would like to care for patient at home. When she had him at a SNF in the past the patient resented being there and made her feel very guilty.   Dehydration - improved with gentle IVF hydration, DC IVFs   Esophageal Candidiasis - GI started pt on fluconazole daily per tube For 14 days total   Hypothyroidism - elevated TSH - pt has had some difficulty taking his oral meds, suspect he has missed many doses of his thyroid supplement, resuming home synthroid dose but will need to be rechecked outpatient   C. Diff infection- GI team started on oral vancomycin to continue to taper over 3-4 weeks   Dysphagia - PEG   Hypernatremia - resolved now   Hypokalemia - repleted with kdur TID through tube   Low Phos - Pt given Kphos IV prior to discharge and sending home on K-phos tabs and  will have Phos checked in 2 days and again on 10/22/11.   ADENOCARCINOMA, PROSTATE  CORONARY ARTERY DISEASE  GERD - continue PPI therapy  CEREBROVASCULAR ACCIDENT, HX OF  S/P AVR (aortic valve replacement)  Hypertension - stable BPs noted on home meds  Hemorrhagic cystitis - chronic  Atrial fibrillation - pt on pacerone and aspirin per his cardiology team  (Hochrein)  Consults: GI  Significant Diagnostic Studies:   Disposition: 01-Home or Self Care with home health arranged RN, Aide, PT  Discharge Orders    Future Appointments: Provider: Department: Dept Phone: Center:   10/26/2011 2:00 PM Hart Carwin, MD [facility] [phone] [facility]   11/05/2011 2:15 PM Corwin Levins, MD [facility] [phone] [facility]   12/15/2011 3:00 PM Rollene Rotunda, MD [facility] [phone] [facility]     Future Orders Please Complete By Expires   Increase activity slowly      Discharge instructions      Comments:   Continue with current TF regimen: 1 can Osmolite 1.5 every 4 hours as tolerated. (Goal of 6 cans per day)  Goal is 6 cans of Osmolite 1.5 daily via PEG which will provide 2130 kcal, 89 gm protein, and 1086 mL free water.   Give 60 ml water flushes before and after each bolus TF can and give an additional 120 mL water flushes 3 times/day to meet all of pt's estimated fluid needs.   Encourage PO intake as tolerated. Home Health to draw blood on Wednesday 10/20/11 and Friday 10/22/11 to check CMP, Mg, Phos and report results to Dr. Juanda Chance or Dr. Oliver Barre Follow Up with Dr. Juanda Chance in 1 week for recheck  Follow up with Dr. Oliver Barre in 1 week for hospital follow up appointment.  Seek medical care or return to ER if symptoms return, worsen, or new problems develop.  Fall Precautions recommended       Medication List     As of 10/18/2011  9:03 AM    STOP taking these medications         ACIDOPHILUS PROBIOTIC 100 MG Caps      megestrol 40 MG/ML suspension   Commonly known as: MEGACE      metroNIDAZOLE 500 MG tablet   Commonly known as: FLAGYL      potassium chloride SA 20 MEQ tablet   Commonly known as: K-DUR,KLOR-CON      TAKE these medications         amiodarone 200 MG tablet   Commonly known as: PACERONE   Take 200 mg by mouth 2 (two) times daily.      aspirin 81 MG tablet   Take 81 mg by mouth daily.       citalopram 20 MG tablet   Commonly known as: CELEXA   Take 1 tablet (20 mg total) by mouth daily.      diphenoxylate-atropine 2.5-0.025 MG per tablet   Commonly known as: LOMOTIL   Take 1 tablet by mouth 4 (four) times daily as needed.      famotidine 40 MG tablet   Commonly known as: PEPCID   Take 40 mg by mouth daily.      feeding supplement (OSMOLITE 1.5 CAL) Liqd   Place 237 mLs into feeding tube 4 (four) times daily. Give 60 ml water flushes before and after each can of feed and give an additional 120 mL water flushes 3 times per day      ferrous sulfate 325 (65 FE) MG tablet  Place 1 tablet (325 mg total) into feeding tube daily with breakfast.      fluconazole 100 MG tablet   Commonly known as: DIFLUCAN   Place 1 tablet (100 mg total) into feeding tube daily.      levothyroxine 25 MCG tablet   Commonly known as: SYNTHROID, LEVOTHROID   Take 25 mcg by mouth daily before breakfast.      mirtazapine 30 MG tablet   Commonly known as: REMERON   Take 1 tablet (30 mg total) by mouth daily.      multivitamin with minerals Tabs   Take 1 tablet by mouth daily.      omeprazole 20 MG capsule   Commonly known as: PRILOSEC   Take 20 mg by mouth 2 (two) times daily.      phosphorus 155-852-130 MG tablet   Commonly known as: K PHOS NEUTRAL   Place 1 tablet into feeding tube 4 (four) times daily.      saccharomyces boulardii 250 MG capsule   Commonly known as: FLORASTOR   Take 1 capsule (250 mg total) by mouth 2 (two) times daily.      thiamine 100 MG tablet   Take 100 mg by mouth daily.      vancomycin 50 mg/mL oral solution   Commonly known as: VANCOCIN   Place 2.5 mLs (125 mg total) into feeding tube every 6 (six) hours. Do this for 10 days. Then give 2.5 mL per tube every 12 hours for 10 days, then give 2.5 mL per tube once every 2 days for 14 days, then stop medication.           Follow-up Information    Follow up with [name], MD. Schedule an  appointment as soon as possible for a visit in 1 week. (follow up PEG)    Contact information:   [address] [place] [address] [phone]       Follow up with Oliver Barre, MD. Schedule an appointment as soon as possible for a visit in 1 week. (hospital follow up )    Contact information:   [address] [place] [address] [phone]         I spent 40 mins preparing discharge, reconciling meds, calling pt's wife, arranging HH, follow up, preparing summary  Signed: Standley Dakins MD [facility] [place], [place] 10/18/2011, 9:03 AM

## 2011-10-18 NOTE — Progress Notes (Signed)
Nutrition Brief Note  Diet: Dysphagia 3, thin, 0% meal intake  TF: 1 can Osmolite 1.5 every 4 hours as tolerated increasing to 1 can every 4 hours as tolerated. Goal is 6 cans of Osmolite 1.5 daily via PEG which will provide 2130 kcal, 89 gm protein, and 1086 mL free water. Recommend 60ml water flushes before and after each bolus TF can and an additional 120 mL water flushes 3 times/day to meet all of pt's estimated fluid needs.    - Noted pt found to be positive for C. Difficile on 8/27. Pt reports tolerating bolus TF well. Pt reports slight minimal nausea this morning. Noted pt to have low phosphorus, potassium, and magnesium WNL. Last BM documented on 9/27. Pt without any nutritional concerns. Noted plans to be d/c today. Wife educated by nursing on home bolus TF administration.    Potassium  Date/Time Value Range Status  10/18/2011  3:32 AM 4.0  3.5 - 5.1 mEq/L Final     RESULT REPEATED AND VERIFIED     DELTA CHECK NOTED  10/17/2011  4:41 AM 2.9* 3.5 - 5.1 mEq/L Final  10/16/2011  3:45 AM 2.9* 3.5 - 5.1 mEq/L Final     RESULT REPEATED AND VERIFIED     DELTA CHECK NOTED    Phosphorus  Date/Time Value Range Status  10/18/2011  3:32 AM 1.0* 2.3 - 4.6 mg/dL Final     CRITICAL RESULT CALLED TO, READ BACK BY AND VERIFIED WITH:     SIDBIQI, K. AT 0910 ON 10/18/11 BY HOBBINS, J.  10/17/2011  4:41 AM 1.2* 2.3 - 4.6 mg/dL Final  1/61/0960  4:54 AM 2.8  2.3 - 4.6 mg/dL Final    Magnesium  Date/Time Value Range Status  10/18/2011  3:32 AM 1.8  1.5 - 2.5 mg/dL Final  0/98/1191  4:78 AM 1.7  1.5 - 2.5 mg/dL Final  2/95/6213  0:86 AM 1.8  1.5 - 2.5 mg/dL Final   Levon Hedger MS, RD, LDN [phone] Pager [phone] After Hours Pager

## 2011-10-18 NOTE — Progress Notes (Signed)
Occupational Therapy Treatment Patient Details Name: [name] MRN: [id number] DOB: 07-Aug-1926 Today's Date: 10/18/2011 Time: 4098-1191 OT Time Calculation (min): 41 min  OT Assessment / Plan / Recommendation Comments on Treatment Session Pt was to d/c home with wife today.  Had wife attempt to get pt out of bed and into the chair.  Wife unable to even get pt to EOB w/o assist.  Pt has been in bed all weekend and has become more debilitated.  Spoke with PT about seeing pt sooner than later to get their thoughts on d/c plan.  Feel pt would benefit from SNF due to current level of functioning as well as pts recent history of multiple falls a month for the last 6 months.    Follow Up Recommendations  Skilled nursing facility    Barriers to Discharge       Equipment Recommendations  None recommended by OT    Recommendations for Other Services    Frequency Min 2X/week   Plan Discharge plan needs to be updated    Precautions / Restrictions Precautions Precautions: Fall;Other (comment) (new peg tube placed.) Precaution Comments: Pt with new peg tube.  Binder was laying under pt, but not fastened.  Refastened binder and educated wife how to do this. Restrictions Weight Bearing Restrictions: No   Pertinent Vitals/Pain Pt with 3/10 report of pain in abdomen.  Pt with O2 on in arrival.  D/C was for today w/o O2.  Took O2 off with nursing approval.  Pt did remain in 90s w/o O2.    ADL  Eating/Feeding: NPO Lower Body Dressing: Performed;Maximal assistance Where Assessed - Lower Body Dressing: Supported sit to stand Toilet Transfer: Performed;Moderate assistance Toilet Transfer Method: Stand pivot Toilet Transfer Equipment: Bedside commode Toileting - Clothing Manipulation and Hygiene: Performed;Maximal assistance Where Assessed - Toileting Clothing Manipulation and Hygiene: Standing Transfers/Ambulation Related to ADLs: Pt transferred with mod assist on first attempt to chair adn min  assist on second attempt.  Pt with posterior lean when standing.  Pt's wife reports pt has not been out of the bed since PEG surgery on Friday and appears much more debilitated than prior to surgery. ADL Comments: Pt's wife assisted pt with adls quite a bit before admission.  Pt needs max assist with most LE adls due to weakness, decreased balance and pain leaning forward from new PEG tube.    OT Diagnosis:    OT Problem List:   OT Treatment Interventions:     OT Goals Acute Rehab OT Goals OT Goal Formulation: With patient Time For Goal Achievement: 10/28/11 Potential to Achieve Goals: Good ADL Goals Pt Will Perform Grooming: with supervision;Standing at sink Pt Will Perform Lower Body Bathing: with min assist;Sit to stand from chair;Sit to stand from bed Pt Will Perform Lower Body Dressing: with min assist;Sit to stand from chair;Sit to stand from bed ADL Goal: Lower Body Dressing - Progress: Progressing toward goals Pt Will Transfer to Toilet: with supervision;Ambulation;Comfort height toilet;3-in-1;Raised toilet seat with arms ADL Goal: Toilet Transfer - Progress: Not progressing Pt Will Perform Toileting - Clothing Manipulation: with min assist;Standing;Sitting on 3-in-1 or toilet ADL Goal: Toileting - Clothing Manipulation - Progress: Not progressing Pt Will Perform Toileting - Hygiene: with min assist;Sit to stand from 3-in-1/toilet ADL Goal: Toileting - Hygiene - Progress: Not progressing  Visit Information  Last OT Received On: 10/18/11 Assistance Needed: +1    Subjective Data      Prior Functioning       Cognition  Overall Cognitive Status: History of cognitive impairments - at baseline Area of Impairment: Memory;Safety/judgement;Awareness of errors;Awareness of deficits Arousal/Alertness: Awake/alert Orientation Level: Disoriented to;Time Behavior During Session: WFL for tasks performed Memory: Decreased recall of precautions Memory Deficits: Pt's wife reports pt  with poor memory for last 6 months or so. Safety/Judgement: Decreased awareness of safety precautions;Impulsive;Decreased awareness of need for assistance Safety/Judgement - Other Comments: Pt stated if he falls at home he can just crawl to his chair to get up.  That is what he always does. Awareness of Errors: Assistance required to correct errors made;Assistance required to identify errors made Awareness of Errors - Other Comments: Pt was not aware of falling to the Right when sitting on the side of the bed.  Pt could not hold self on side of bed w/o assist. Awareness of Deficits: Pt not aware of severity of his physical status.  He feels he can get up on his own.  He says he is used to falling all the tie. Cognition - Other Comments: Pt has had long history of falls.  Wife states 15 or so falls EACH MONTH for the last 6 months.  Pt need to consider ALF.    Mobility  Shoulder Instructions Bed Mobility Bed Mobility: Supine to Sit;Sitting - Scoot to Edge of Bed Supine to Sit: HOB flat;With rails;3: Mod assist Sitting - Scoot to Edge of Bed: 2: Max assist;Other (comment) (pt unable to do w/o therapist pulling on chucks pad.) Details for Bed Mobility Assistance: Pt unable to sit on EOB w/o min to mod assist at times.  Pt leaning to the R and posterior and was unaware he was doing so. Transfers Transfers: Sit to Stand;Stand to Sit Sit to Stand: 3: Mod assist;From bed;With armrests;With upper extremity assist Stand to Sit: 3: Mod assist;With armrests;To chair/3-in-1;With upper extremity assist Details for Transfer Assistance: VC for use of UE to push off and reach beck       Exercises      Balance Balance Balance Assessed: Yes Static Sitting Balance Static Sitting - Balance Support: Bilateral upper extremity supported;Feet supported Static Sitting - Level of Assistance: 3: Mod assist Static Sitting - Comment/# of Minutes: 3   End of Session OT - End of Session Activity Tolerance:  Patient limited by fatigue Patient left: in chair;with call bell/phone within reach;with family/visitor present Nurse Communication: Mobility status;Other (comment) (need to re consider SNF for d/c over home with [facility])  GO     Hope Budds 10/18/2011, 12:57 PM [phone]

## 2011-10-18 NOTE — Progress Notes (Signed)
Discharge Held:  Pt's wife changed her mind and decided to pursue SNF rehab placement. I asked social worker to meet with her with bed offers.   Maryln Manuel, MD [phone]

## 2011-10-19 DIAGNOSIS — R197 Diarrhea, unspecified: Secondary | ICD-10-CM

## 2011-10-19 LAB — PHOSPHORUS: Phosphorus: 1.7 mg/dL — ABNORMAL LOW (ref 2.3–4.6)

## 2011-10-19 MED ORDER — POTASSIUM PHOSPHATE DIBASIC 3 MMOLE/ML IV SOLN
20.0000 mmol | Freq: Once | INTRAVENOUS | Status: AC
  Administered 2011-10-19: 20 mmol via INTRAVENOUS
  Filled 2011-10-19: qty 6.67

## 2011-10-19 MED ORDER — LORAZEPAM 2 MG/ML IJ SOLN
0.5000 mg | INTRAMUSCULAR | Status: DC | PRN
  Administered 2011-10-19 (×2): 0.5 mg via INTRAVENOUS
  Filled 2011-10-19 (×2): qty 1

## 2011-10-19 NOTE — Progress Notes (Signed)
Occupational Therapy Treatment Patient Details Name: DAXTIN DESNOYERS MRN: [id number] DOB: [birth year] Today's Date: 10/19/2011 Time: 1308-6578 OT Time Calculation (min): 30 min  OT Assessment / Plan / Recommendation Comments on Treatment Session Pt with poor safety and attention affecting adls.      Follow Up Recommendations  Skilled nursing facility    Barriers to Discharge       Equipment Recommendations  None recommended by PT;None recommended by OT    Recommendations for Other Services    Frequency Min 2X/week   Plan Discharge plan remains appropriate    Precautions / Restrictions Precautions Precautions: Fall Precaution Comments: pt had numerous falls at home.  Pt moves impulsively, fatigues easily and has greater chance to fall Restrictions Weight Bearing Restrictions: No   Pertinent Vitals/Pain C/o pain with movement but did not say where or amount.  Pain decreased when repositioned    ADL  Toilet Transfer: Performed;Moderate assistance;Minimal assistance (1 trial min A; 2 times mod A:  3 transfers total) Toilet Transfer Method: Stand pivot Acupuncturist: Bedside commode Toileting - Clothing Manipulation and Hygiene: Performed;+1 Total assistance Where Assessed - Toileting Clothing Manipulation and Hygiene: Standing Transfers/Ambulation Related to ADLs: pt felt like he had to use bathroom.  Wanted to walk to bathroom but only performed spt due to decreased safety.  Pt sits prematurely.  Fatiqued as time progressed.  Wife present, MD also came in during tx.  Pt followed multi modal commands (1 step) 75% of time accurately but he is impulsive and needs Ax2 beyond a SPT for safety.   ADL Comments: worked on sitting balance, dynamic, when sitting eob    OT Diagnosis:    OT Problem List:   OT Treatment Interventions:     OT Goals Acute Rehab OT Goals Time For Goal Achievement: 10/28/11 ADL Goals Pt Will Transfer to Toilet: with supervision;Ambulation;Comfort  height toilet;3-in-1;Raised toilet seat with arms ADL Goal: Toilet Transfer - Progress: Other (comment) (slow progress) Pt Will Perform Toileting - Hygiene: Sit to stand from 3-in-1/toilet;with mod assist ADL Goal: Toileting - Hygiene - Progress: Revised due to lack of progress  Visit Information  Last OT Received On: 10/19/11 Assistance Needed: +2 (+1 for spt only)    Subjective Data      Prior Functioning       Cognition  Overall Cognitive Status: History of cognitive impairments - at baseline Area of Impairment: Memory;Safety/judgement;Awareness of errors;Awareness of deficits Arousal/Alertness: Awake/alert Orientation Level: Disoriented to;Time Behavior During Session: Other (comment) (difficulty sustaining attention.  Cooperative but impulsive) Safety/Judgement: Decreased awareness of safety precautions;Impulsive;Decreased awareness of need for assistance;Decreased safety judgement for tasks assessed Safety/Judgement - Other Comments: impulsive and decreased safety awareness Awareness of Errors: Assistance required to correct errors made;Assistance required to identify errors made Awareness of Deficits: decreased awareness     Mobility  Shoulder Instructions Bed Mobility Supine to Sit: 3: Mod assist Sitting - Scoot to Edge of Bed: 3: Mod assist Transfers Sit to Stand: 4: Min assist Stand to Sit: 4: Min assist Details for Transfer Assistance: mod to max multimodal cues       Exercises     Balance Balance  Dynamic Sitting Balance Dynamic Sitting - Balance Support:  (1 hand used at a time. min excursion from BOS) Dynamic Sitting - Level of Assistance: 5: Stand by assistance Dynamic Sitting Balance - Compensations:  (3 minutes due to decreased attentioni)     End of Session OT - End of Session Activity Tolerance: Other (comment) (attention  and fatique limit pt) Patient left: in bed;with bed alarm set;with family/visitor present  GO      Sonal Dorwart 10/19/2011, 2:08 PM Marica Otter, OTR/L [phone] 10/19/2011

## 2011-10-19 NOTE — Progress Notes (Signed)
Per Dr. Laural Benes, the patient's wife is now wanting to take patient home with [facility]- she does not want to force him to go to SNF since he is so adamantly against it- RNCM aware and HH services arranged for d/c. Reece Levy, MSW, Theresia Majors [phone]

## 2011-10-19 NOTE — Clinical Social Work Placement (Signed)
     Clinical Social Work Department CLINICAL SOCIAL WORK PLACEMENT NOTE 10/19/2011  Patient:  GAD, AYMOND  Account Number:  [id number] Admit date:  10/13/2011  Clinical Social Worker:  Robin Searing  Date/time:  10/19/2011 10:29 AM  Clinical Social Work is seeking post-discharge placement for this patient at the following level of care:   SKILLED NURSING   (*CSW will update this form in Epic as items are completed)   10/19/2011  Patient/family provided with [facility] Department of Clinical Social Works list of facilities offering this level of care within the geographic area requested by the patient (or if unable, by the patients family).  10/19/2011  Patient/family informed of their freedom to choose among providers that offer the needed level of care, that participate in Medicare, Medicaid or managed care program needed by the patient, have an available bed and are willing to accept the patient.  10/19/2011  Patient/family informed of [facility] ownership interest in [facility], as well as of the fact that they are under no obligation to receive care at this facility.  PASARR submitted to EDS on 10/19/2011 PASARR number received from EDS on   FL2 transmitted to all facilities in geographic area requested by pt/family on  10/19/2011 FL2 transmitted to all facilities within larger geographic area on   Patient informed that his/her managed care company has contracts with or will negotiate with  certain facilities, including the following:     Patient/family informed of bed offers received:   Patient chooses bed at  Physician recommends and patient chooses bed at    Patient to be transferred to  on   Patient to be transferred to facility by   The following physician request were entered in Epic:   Additional Comments:

## 2011-10-19 NOTE — Progress Notes (Signed)
[facility] Progress Note  10/19/2011   Subjective: Pt refuses to go to SNF.  I had a long discussion with his wife today and she is agreeable to take him home tomorrow.  He is going to continue to work with PT/OT today and have HHPT.  Pt is ambulating a little better today and a little stronger today.    Objective:  Vital signs in last 24 hours: Filed Vitals:   10/18/11 1425 10/18/11 2233 10/19/11 0625 10/19/11 0627  BP: 110/68 116/74 124/65   Pulse: 80 84 82   Temp: 97.3 F (36.3 C) 98.6 F (37 C) 98.5 F (36.9 C)   TempSrc: Oral Oral Oral   Resp: 20 18 16    Height:      Weight:    62.5 kg (137 lb 12.6 oz)  SpO2: 94% 100% 97%    Weight change:   Intake/Output Summary (Last 24 hours) at 10/19/11 1355 Last data filed at 10/19/11 0600  Gross per 24 hour  Intake      0 ml  Output    325 ml  Net   -325 ml   Lab Results  Component Value Date   HGBA1C 5.2 08/29/2011   HGBA1C 5.6 09/18/2010   Lab Results  Component Value Date   LDLCALC 29 08/29/2011   CREATININE 0.68 10/18/2011    Review of Systems As above, otherwise all reviewed and reported negative  Physical Exam General - awake, cachectic, no distress, cooperative  HEENT - NCAT, MMM Lungs - BBS, shallow, CTA  CV - normal s1, s2 sounds  Abd - soft, binder in place, nondistended, no masses, nontender PEG site clean and dry  Ext - no C/C/E  Lab Results: Results for orders placed during the hospital encounter of 10/13/11 (from the past 24 hour(s))  PHOSPHORUS     Status: Abnormal   Collection Time   10/19/11  3:33 AM      Component Value Range   Phosphorus 1.7 (*) 2.3 - 4.6 mg/dL  MAGNESIUM     Status: Normal   Collection Time   10/19/11  3:33 AM      Component Value Range   Magnesium 1.7  1.5 - 2.5 mg/dL    Micro Results: Recent Results (from the past 240 hour(s))  CLOSTRIDIUM DIFFICILE BY PCR     Status: Abnormal   Collection Time   10/15/11  7:54 AM      Component Value Range Status Comment   C  difficile by pcr POSITIVE (*) NEGATIVE Final     Medications:  Scheduled Meds:   . acidophilus  1 capsule Oral TID WC  . amiodarone  200 mg Oral BID  . citalopram  20 mg Oral Daily  . feeding supplement (OSMOLITE 1.5 CAL)  237 mL Per Tube QID  . ferrous sulfate  325 mg Oral Q breakfast  . fluconazole  100 mg Oral Daily  . levothyroxine  25 mcg Oral QAC breakfast  . mirtazapine  30 mg Oral Daily  . multivitamin with minerals  1 tablet Oral Daily  . potassium phosphate IVPB (mmol)  20 mmol Intravenous Once  . potassium phosphate IVPB (mmol)  20 mmol Intravenous Once  . saccharomyces boulardii  250 mg Oral BID  . thiamine  100 mg Oral Daily  . vancomycin  250 mg Oral Q6H   Continuous Infusions:  PRN Meds:.acetaminophen, acetaminophen, diphenoxylate-atropine, HYDROcodone-acetaminophen, ondansetron (ZOFRAN) IV, ondansetron  Assessment/Plan: Severe malnutrition / Protein-calorie malnutrition, severe  - PEG placed, tolerating TF  -  dietitian consulted for treatment recs and will proceed with slowly starting feeds as ordered  - monitoring for refeeding syndrome -  DC tomorrow to home with wife and HHPT/OT  Dehydration - improved with gentle IVF hydration, decreasing rate of IVFs today as TF are increasing   Esophageal Candidiasis - GI started pt on fluconazole daily per tube For 14 days   Hypothyroidism - elevated TSH - pt has had some difficulty taking his oral meds, suspect he has missed many doses of his thyroid supplement, resuming home synthroid dose but will need to be rechecked outpatient   C. Diff -  oral vancomycin tapered of 3-4 weeks ordered  Dysphagia - PEG   Hypernatremia - resolved now   Hypokalemia - repleted through tube as ordered, recheck bmp in AM   Hypophosphatemia - improving with IV replacement  ADENOCARCINOMA, PROSTATE  CORONARY ARTERY DISEASE  GERD - continue PPI therapy  CEREBROVASCULAR ACCIDENT, HX OF  S/P AVR (aortic valve replacement)    Hypertension - stable BPs noted on home meds  Hemorrhagic cystitis - chronic  Atrial fibrillation - pt on pacerone and aspirin per his cardiology team (Hochrein)    LOS: 6 days   Clanford Johnson 10/19/2011, 1:55 PM  Cleora Fleet, MD, CDE, FAAFP [facility] [place], [place]  [phone]

## 2011-10-19 NOTE — Clinical Social Work Psychosocial (Signed)
     Clinical Social Work Department BRIEF PSYCHOSOCIAL ASSESSMENT 10/19/2011  Patient:  [name], [name]     Account Number:  [id number]     Admit date:  10/13/2011  Clinical Social Worker:  Robin Searing  Date/Time:  10/18/2011 04:00 PM  Referred by:  Physician  Date Referred:  10/19/2011 Referred for  SNF Placement   Other Referral:   Interview type:  Other - See comment Other interview type:   met with patient and wife at bedside    PSYCHOSOCIAL DATA Living Status:  WIFE Admitted from facility:   Level of care:   Primary support name:  wife Primary support relationship to patient:  FAMILY Degree of support available:   good but minimal assistance    CURRENT CONCERNS Current Concerns  Post-Acute Placement   Other Concerns:    SOCIAL WORK ASSESSMENT / PLAN Patient refusing to consider SNF at d/c- states he has been to one one in the past for 7 days and he "would rather die".    Encouraged him to reconsider and will see how he does with PT today-   Assessment/plan status:  Other - See comment Other assessment/ plan:   Will need to revisit d/c plans with patient after PT session today.   Information/referral to community resources:   SNF  [facility]    PATIENTS/FAMILYS RESPONSE TO PLAN OF CARE: Wife seems overwhelmed- concerned about her ability to manage him at home-

## 2011-10-19 NOTE — Progress Notes (Signed)
Physical Therapy Treatment Patient Details Name: [name] MRN: [id number] DOB: 07-May-1926 Today's Date: 10/19/2011 Time: 2841-3244 PT Time Calculation (min): 43 min  PT Assessment / Plan / Recommendation Comments on Treatment Session  Pt was better in mobility at the start of session today, but he deteriorated in performance with fatigue and pain.  He also demonstrated some cognitive deficits and poor impulsivity control with decreased safety and high risk for fall.  Pt will need 24/7 assist at mod-max level --higher than wife can provide. He would definitely  benefit from SNF level of therapy.  If pt. continues to refuse despite education about poor safety and risk of injuty to he and his wife, he will need HHPT, adie and sitter service at home.    Follow Up Recommendations  Post acute inpatient rehab;Home health PT;Supervision/Assistance - 24 hour    Barriers to Discharge        Equipment Recommendations  None recommended by PT    Recommendations for Other Services    Frequency Min 3X/week   Plan Discharge plan remains appropriate;Frequency remains appropriate    Precautions / Restrictions Precautions Precautions: Fall Precaution Comments: pt had numerous falls at home.  Pt moves impulsively, fatigues easily and has greater chance to fall   Pertinent Vitals/Pain C/o abdominal pain with activity    Mobility  Bed Mobility Bed Mobility: Rolling Right;Rolling Left Rolling Right: 4: Min assist Rolling Left: 4: Min assist Supine to Sit: 3: Mod assist Sitting - Scoot to Edge of Bed: 3: Mod assist Sit to Supine: 3: Mod assist Details for Bed Mobility Assistance: pt requires extra time and hand over hand assist to reach for rails and assist with moving legs Transfers Transfers: Sit to Stand;Stand to Sit Sit to Stand: 4: Min assist Stand to Sit: 4: Min assist Stand Pivot Transfers: 4: Min assist Details for Transfer Assistance: Pt needs several attempts to stand and several  trials to remember where to place arms to assist with push up Inconsistent performance with several trials.  Pt able to do it on his own a couple of times Ambulation/Gait Ambulation/Gait Assistance: 2: Max assist;4: Min assist Ambulation Distance (Feet): 50 Feet (40, 10) Assistive device: Rolling walker Ambulation/Gait Assistance Details: at end of walk, pt needed max assist to keep hands on RW, keep trunk up straight and move RW toward chair for safe landing Gait Pattern: Step-through pattern;Trunk flexed Gait velocity: impulsive General Gait Details: Pt started out with gait controlled and he was even able to take multiple steps and maintain balance in upright.  However, as he fatigued, gait quickly deteriorated and he needed max assist to stay on his feet.Pt did not show any movements to protect himself from falling and relied on PT Stairs: No Wheelchair Mobility Wheelchair Mobility: No    Exercises General Exercises - Lower Extremity Ankle Circles/Pumps: AAROM;Both;5 reps;Supine Quad Sets: AROM;Both;5 reps;Supine Gluteal Sets: AROM;Standing;Both;5 reps Straight Leg Raises: Both;AAROM;5 reps;Supine Hip Flexion/Marching: AAROM;Both;5 reps;Supine     PT Goals Acute Rehab PT Goals PT Goal: Sit to Stand - Progress: Progressing toward goal PT Goal: Stand to Sit - Progress: Progressing toward goal PT Goal: Ambulate - Progress: Progressing toward goal  Visit Information  Last PT Received On: 10/19/11 Assistance Needed: +2 (safety when pt moves impulsively)    Subjective Data  Subjective: "Hi! How are you today" (not sure pt remembered me from yesterday, he was just being pleasant and making small talk) Patient Stated Goal: pt still refusing to even consider short  term SNF despite obvious need for assistance. Pt wants to go home   Cognition  Overall Cognitive Status: History of cognitive impairments - at baseline Area of Impairment: Memory;Safety/judgement;Awareness of  errors;Awareness of deficits Arousal/Alertness: Awake/alert Orientation Level: Disoriented to;Time Behavior During Session: Anxious (pt has increased anxiety/impulsiveness with pain, fatigue) Memory: Decreased recall of precautions Memory Deficits: pt cannot remember wife is not here yet this morning Safety/Judgement: Decreased awareness of safety precautions;Impulsive;Decreased awareness of need for assistance Safety/Judgement - Other Comments: pt does not seem to have regard for safety.  unable to take his time and wait for safety Awareness of Errors: Assistance required to correct errors made;Assistance required to identify errors made Awareness of Deficits: decreased awareness Cognition - Other Comments: Although pt was able to state where and why he was here, he could not understand wife was not currently here and go up impulsively and started to walk to hallway. He then backed up and moved to bed without regard to safety.  Pt will admit that it will take time to get stronger, he adamantly refurses to go to SNF for rehab. He is not able to acknowledge he is not safe to go home yet.    Balance  Balance Balance Assessed: Yes Static Sitting Balance Static Sitting - Balance Support: No upper extremity supported Static Sitting - Level of Assistance: 6: Modified independent (Device/Increase time) Static Sitting - Comment/# of Minutes: 3 Static Standing Balance Static Standing - Balance Support: Bilateral upper extremity supported Static Standing - Level of Assistance: 5: Stand by assistance Static Standing - Comment/# of Minutes: 1 minute after one minute, pt became restless and started to move feet, lose postural control and impulsively reach for bed to sit down. High Level Balance High Level Balance Activites: Sudden stops High Level Balance Comments: pt was able to walk 5 steps, stop and stand 5 sec and repeat about 4 times, before he became too fatigued to continue controlled movement    End of Session PT - End of Session Equipment Utilized During Treatment: Gait belt Activity Tolerance: Patient limited by fatigue;Patient limited by pain;Treatment limited secondary to agitation Patient left: in bed;with bed alarm set;Other (comment) (sidelying on left side)   GP    Rosey Bath K. Brown,PT [phone] 10/19/2011, 11:00 AM

## 2011-10-20 DIAGNOSIS — C61 Malignant neoplasm of prostate: Secondary | ICD-10-CM

## 2011-10-20 LAB — COMPREHENSIVE METABOLIC PANEL
Albumin: 1.5 g/dL — ABNORMAL LOW (ref 3.5–5.2)
Alkaline Phosphatase: 174 U/L — ABNORMAL HIGH (ref 39–117)
BUN: 13 mg/dL (ref 6–23)
CO2: 24 mEq/L (ref 19–32)
Chloride: 101 mEq/L (ref 96–112)
Creatinine, Ser: 0.65 mg/dL (ref 0.50–1.35)
GFR calc non Af Amer: 88 mL/min — ABNORMAL LOW (ref >90)
Potassium: 3.7 mEq/L (ref 3.5–5.1)
Total Bilirubin: 0.5 mg/dL (ref 0.3–1.2)

## 2011-10-20 LAB — CBC
HCT: 30.3 % — ABNORMAL LOW (ref 39.0–52.0)
Hemoglobin: 10.4 g/dL — ABNORMAL LOW (ref 13.0–17.0)
MCH: 28.6 pg (ref 26.0–34.0)
MCHC: 34.3 g/dL (ref 30.0–36.0)
MCV: 83.2 fL (ref 78.0–100.0)
RBC: 3.64 MIL/uL — ABNORMAL LOW (ref 4.22–5.81)

## 2011-10-20 LAB — PHOSPHORUS: Phosphorus: 3.6 mg/dL (ref 2.3–4.6)

## 2011-10-20 MED ORDER — OSMOLITE 1.5 CAL PO LIQD
237.0000 mL | Freq: Every day | ORAL | Status: DC
  Filled 2011-10-20 (×2): qty 237

## 2011-10-20 NOTE — Plan of Care (Signed)
Problem: Phase II Progression Outcomes Goal: Progress activity as tolerated unless otherwise ordered Outcome: Not Progressing Poor tolerance today.  Sitter reports pt didn't sleep last night.

## 2011-10-20 NOTE — Progress Notes (Signed)
Patient's wife [name] given discharge instructions on how to take care of PEG, and do feedings.  Wife comfortable with instructions.  [name] given number to Dr. Elisabeth Pigeon and [facility] [address] [facility] should she have any questions or concerns.  Patient to travel home via ambulance.   Philomena Doheny RN

## 2011-10-20 NOTE — Progress Notes (Signed)
Physical Therapy Treatment Patient Details Name: [name] MRN: [id number] DOB: [birth year] Today's Date: 10/20/2011 Time: 4098-1191 PT Time Calculation (min): 24 min  PT Assessment / Plan / Recommendation Comments on Treatment Session  pt not progressing; do not feel wife can handle him at home at current status    Follow Up Recommendations  Post acute inpatient rehab    Barriers to Discharge        Equipment Recommendations  None recommended by PT;None recommended by OT    Recommendations for Other Services    Frequency Min 3X/week   Plan Discharge plan remains appropriate;Frequency remains appropriate    Precautions / Restrictions Precautions Precautions: Fall Precaution Comments: binder under pt arms, not on abd; repositioned binder Restrictions Weight Bearing Restrictions: No   Pertinent Vitals/Pain     Mobility  Bed Mobility Bed Mobility: Rolling Right Rolling Right: 1: +2 Total assist Rolling Right: Patient Percentage: 50% Supine to Sit: 1: +2 Total assist Supine to Sit: Patient Percentage: 50% Sitting - Scoot to Edge of Bed: 3: Mod assist Sit to Supine: 1: +2 Total assist Sit to Supine: Patient Percentage: 50% Details for Bed Mobility Assistance: pt requires extra time and hand over hand assist to reach for rails and assist with moving legsand trunk Transfers Transfers: Sit to Stand;Stand to Sit Sit to Stand: 1: +2 Total assist (X2) Sit to Stand: Patient Percentage: 70% Stand to Sit: 1: +2 Total assist;To bed Stand to Sit: Patient Percentage: 70%    Exercises     PT Diagnosis:    PT Problem List:   PT Treatment Interventions:     PT Goals Acute Rehab PT Goals Time For Goal Achievement: 10/28/11 Potential to Achieve Goals: Fair Pt will go Sit to Stand: with supervision PT Goal: Sit to Stand - Progress: Not progressing Pt will go Stand to Sit: with supervision PT Goal: Stand to Sit - Progress: Not progressing Pt will Ambulate: >150 feet;with  supervision;with least restrictive assistive device PT Goal: Ambulate - Progress: Not progressing  Visit Information  Last PT Received On: 10/20/11 Assistance Needed: +2 PT/OT Co-Evaluation/Treatment: Yes    Subjective Data  Subjective: Pt sleeping, but arouses easily  Patient Stated Goal: pt refusing SNF   Cognition  Overall Cognitive Status: History of cognitive impairments - at baseline Area of Impairment: Memory;Safety/judgement;Awareness of errors;Awareness of deficits Arousal/Alertness: Awake/alert Orientation Level: Disoriented to;Time Behavior During Session: Other (comment) (groggy but kept eyes open) Safety/Judgement: Decreased awareness of safety precautions;Impulsive;Decreased awareness of need for assistance;Decreased safety judgement for tasks assessed    Balance  Static Sitting Balance Static Sitting - Balance Support: Bilateral upper extremity supported Static Sitting - Level of Assistance: 4: Min assist Static Sitting - Comment/# of Minutes:  (LOB to right, pt unaware)  End of Session PT - End of Session Activity Tolerance: Patient limited by fatigue Patient left: in bed;with call bell/phone within reach Psychiatrist) Nurse Communication: Mobility status   GP     [facility] 10/20/2011, 4:01 PM

## 2011-10-20 NOTE — Progress Notes (Signed)
Occupational Therapy Treatment Patient Details Name: [name] MRN: [id number] DOB: August 03, 1926 Today's Date: 10/20/2011 Time: 9147-8295 OT Time Calculation (min): 17 min  OT Assessment / Plan / Recommendation Comments on Treatment Session cotx with PT.  Had planned to ambulate to sink with +2 for safety but pt not able to tolerate today.  Slept poorly.      Follow Up Recommendations  Skilled nursing facility.  Talked to wife before session.  Strongly recommend STSNF as pt is unpredictable/unsafe with transfers.    Barriers to Discharge       Equipment Recommendations  None recommended by PT;None recommended by OT    Recommendations for Other Services    Frequency Min 2X/week   Plan Discharge plan remains appropriate    Precautions / Restrictions Precautions Precautions: Fall Restrictions Weight Bearing Restrictions: No   Pertinent Vitals/Pain No c/o pain.    ADL  Grooming: Performed;Brushing hair;Wash/dry face;Maximal assistance (vcs to continue activity) Where Assessed - Grooming: Supported standing (eob, intermittent support) Transfers/Ambulation Related to ADLs: overlapped with PT.  Pt had been sleeping--did not sleep well.  Pt was leaning to R in unsupported sitting and needed intermittent assist to correct as well as multimodal cues ADL Comments: unable to complete grooming in standing.  mod cues for sit to stand--allowed pt to use rw to pull up    OT Diagnosis:    OT Problem List:   OT Treatment Interventions:     OT Goals ADL Goals Pt Will Perform Grooming: with supervision;Standing at sink ADL Goal: Grooming - Progress: Not progressing  Visit Information  Last OT Received On: 10/20/11 Assistance Needed: +2 PT/OT Co-Evaluation/Treatment: Yes    Subjective Data      Prior Functioning       Cognition  Overall Cognitive Status: History of cognitive impairments - at baseline Area of Impairment: Memory;Safety/judgement;Awareness of errors;Awareness of  deficits Arousal/Alertness: Awake/alert Behavior During Session: Other (comment) (groggy but kept eyes open)    Mobility  Shoulder Instructions Transfers Sit to Stand: 1: +2 Total assist Sit to Stand: Patient Percentage: 70% (two trials)       Exercises      Balance Static Sitting Balance Static Sitting - Balance Support: Bilateral upper extremity supported Static Sitting - Level of Assistance: 4: Min assist   End of Session OT - End of Session Activity Tolerance: Patient limited by fatigue Patient left: in bed;with call bell/phone within reach;Other (comment) (sitter)  GO     Joeph Szatkowski 10/20/2011, 2:37 PM Marica Otter, OTR/L [phone] 10/20/2011

## 2011-10-20 NOTE — Progress Notes (Signed)
Spoke with [employer] and [employer] on [employer].  Patient will not be able to have vancomycin filled due to prior authorization from insurance for 24 hours.  Authorization will be done within 24 hours, and # is [id number].  Dr. Elisabeth Pigeon notified, as well wife Danne Harbor.  Philomena Doheny RN

## 2011-10-20 NOTE — Progress Notes (Addendum)
[facility] PROGRESS NOTE  [name] HYQ:[id number] DOB: 10/04/1926 DOA: 10/13/2011 PCP: Oliver Barre, MD  Please refer to the discharge summary done 10/18/2011  Brief narrative: 78 year old male with working diagnosis of severe malnutrition, C. difficile colitis and Candida esophagitis.  Assessment and plan  Principal problem *Severe malnutrition / Protein-calorie malnutrition, severe  - PEG placed, tolerating TF  - Nutrition consulted - Patient to followup with gastroenterology per scheduled appointment  Active problem Dehydration  - improved with IV fluids  Esophageal Candidiasis  - Day 6/14 of fluconazole  Hypothyroidism  - elevated TSH  - pt has had some difficulty taking his oral meds, suspect he has missed many doses of his thyroid supplement, resuming home synthroid dose but will need to be rechecked outpatient   C. Diff  - oral vancomycin tapered of 3-4 weeks, prescription provided   Dysphagia  - PEG   Hypernatremia  - resolved now   Hypokalemia  - Resolved   Hypophosphatemia  - Resolved  Code Status: Full code Family Communication: Patient's wife is not present at bedside; contact information left for patient's family if any concerns or issues before and after discharge Disposition Plan: Home today with home health PT  Manson Passey, MD  [facility] Pager [phone]  If 7PM-7AM, please contact night-coverage [email or web address] Password [email or web address] 10/20/2011, 1:57 PM   LOS: 7 days   Consultants:  Gastroenterology  Procedures:  None  Antibiotics:  Vancomycin by mouth  Fluconazole, day 6/14  HPI/Subjective: No acute overnight events.  Objective: Filed Vitals:   10/19/11 1400 10/19/11 2230 10/20/11 0701 10/20/11 1336  BP: 110/76 142/86 113/69 117/102  Pulse: 84 83 84 125  Temp: 98.2 F (36.8 C) 97.9 F (36.6 C) 98.1 F (36.7 C) 97.8 F (36.6 C)  TempSrc: Oral Oral Oral Oral  Resp: 16 16 14 20   Height:      Weight:   62.143 kg (137 lb)     SpO2: 100% 97% 98% 90%   No intake or output data in the 24 hours ending 10/20/11 1357  Exam:   General:  Pt is not in acute distress  Cardiovascular: Regular rate and rhythm, S1/S2 appreciated  Respiratory: Basilar crackles bilaterally, diminished breath sounds at bases  Abdomen: Soft, non tender, non distended, PEG tube in place, bowel sounds present, no guarding  Extremities: No edema, pulses DP and PT palpable bilaterally  Neuro: Grossly nonfocal  Data Reviewed: Basic Metabolic Panel:  Lab 10/20/11 2440 10/19/11 0333 10/18/11 0332 10/17/11 0441 10/16/11 0345 10/14/11 0800  NA 132* -- 136 138 146* 143  K 3.7 -- 4.0 2.9* 2.9* 3.5  CL 101 -- 106 108 113* 113*  CO2 24 -- 24 24 23 25   GLUCOSE 71 -- 117* 153* 65* 73  BUN 13 -- 15 14 15 21   CREATININE 0.65 -- 0.68 0.70 0.73 0.81  CALCIUM 7.5* -- 7.1* 7.2* 7.5* 7.4*  MG 1.9 1.7 1.8 1.7 1.8 --  PHOS 3.6 1.7* 1.0* 1.2* 2.8 --   Liver Function Tests:  Lab 10/20/11 0502 10/16/11 0345 10/14/11 0800 10/13/11 1448  AST 33 18 18 24   ALT 15 7 10 13   ALKPHOS 174* 97 85 95  BILITOT 0.5 0.2* 0.2* 0.3  PROT 4.7* 4.2* 4.2* 5.3*  ALBUMIN 1.5* 1.5* 1.5* 2.0*    Lab 10/13/11 1448  LIPASE 30  AMYLASE --   CBC:  Lab 10/20/11 0502 10/14/11 0800 10/13/11 1448  WBC 7.8 9.0 11.2*  HGB 10.4* 9.8* 11.0*  HCT 30.3* 29.9* 33.3*  MCV 83.2 87.7 87.6  PLT 286 248 266     C difficile by pcr POSITIVE (*) NEGATIVE Final      Scheduled Meds:   . acidophilus  1 capsule Oral TID WC  . amiodarone  200 mg Oral BID  . citalopram  20 mg Oral Daily  . feeding supplement   237 mL Per Tube QID  . ferrous sulfate  325 mg Oral Q breakfast  . fluconazole  100 mg Oral Daily  . levothyroxine  25 mcg Oral QAC breakfast  . mirtazapine  30 mg Oral Daily  . multivitamin   1 tablet Oral Daily  . saccharomyces boulardii  250 mg Oral BID  . thiamine  100 mg Oral Daily  . vancomycin  250 mg Oral Q6H

## 2011-10-20 NOTE — Progress Notes (Signed)
Nutrition Brief Note  Intervention: Increased TF to Osmolite 1.5 via PEG 6 cans daily which provides which will provide 2130 calories, 89g protein, and free water. Pt will receive 60ml water flushes before and after each bolus TF can. If IVF d/c, recommend additional water flushes 3 times/day to meet all of pt's estimated fluid needs.   - Noted plans for d/c today. Pt currently getting 4 cans of Osmolite 1.5/day and pt has been tolerating them well per RN with no TF residuals. Pt has not been eating anything by mouth. Per sitter, pt has been asleep most of the morning. Noted pt's current TF plan provides 1420 calories, 60g protein - meeting 76% of calorie needs, and 67% of protein needs. Discussed with MD the recommended TF goal of 6 cans of Osmolite 1.5 and order was put in for this goal rate via verbal order. Will be available for any further nutrition concerns before discharge.   Levon Hedger MS, RD, LDN [phone] Pager [phone] After Hours Pager

## 2011-10-21 ENCOUNTER — Telehealth: Payer: Self-pay | Admitting: Internal Medicine

## 2011-10-21 LAB — GLUCOSE, CAPILLARY: Glucose-Capillary: 236 mg/dL — ABNORMAL HIGH (ref 70–99)

## 2011-10-21 NOTE — Telephone Encounter (Signed)
Ideally, the feeding tube would help "build him up" with more time at home  Can we wait until Tues?  If not improved when sees Dr Juanda Chance, he very likely should have hospice eval

## 2011-10-21 NOTE — Telephone Encounter (Signed)
Glucerna is perfectly fine, she may use it interchangably.

## 2011-10-21 NOTE — Telephone Encounter (Signed)
[name] came home from the hospital yesterday.  He is very weak.  He slid into the floor from his cot this morning.  EMS had to come to get him up.  He can't stand or walk on his own at all.  He won't go to a nursing home.  [facility] coming out to the home.  His wife thinks she may need Hospice to start coming also.  He has an appt Tues to see Dr. Juanda Chance.  She is not sure if she can get him here next week for another appt.  They have one with Dr. [name] on Oct 18.  He still has a feeding tube.

## 2011-10-21 NOTE — Telephone Encounter (Signed)
Left a message for patient's family to call me.

## 2011-10-21 NOTE — Telephone Encounter (Signed)
Patient's wife cannot get osmolite for tube feeding until next week because it has to be ordered. She has Glycerna and will use this until she gets it. She reports the patient came home yesterday and still is unable to stand. She is calling Dr. [name] about this and to discuss home needs.

## 2011-10-21 NOTE — Telephone Encounter (Signed)
Line busy - will try again later

## 2011-10-22 NOTE — Telephone Encounter (Signed)
Patients wife informed of MD instructions. 

## 2011-10-25 ENCOUNTER — Telehealth: Payer: Self-pay | Admitting: Internal Medicine

## 2011-10-25 ENCOUNTER — Inpatient Hospital Stay (HOSPITAL_COMMUNITY)
Admission: EM | Admit: 2011-10-25 | Discharge: 2011-11-01 | DRG: 371 | Disposition: A | Discharge status: Skilled Nursing Facility | Payer: Medicare Other | Attending: Internal Medicine | Admitting: Internal Medicine

## 2011-10-25 ENCOUNTER — Encounter (HOSPITAL_COMMUNITY): Payer: Self-pay

## 2011-10-25 ENCOUNTER — Emergency Department (HOSPITAL_COMMUNITY): Payer: Medicare Other

## 2011-10-25 DIAGNOSIS — K279 Peptic ulcer, site unspecified, unspecified as acute or chronic, without hemorrhage or perforation: Secondary | ICD-10-CM

## 2011-10-25 DIAGNOSIS — IMO0002 Reserved for concepts with insufficient information to code with codable children: Secondary | ICD-10-CM

## 2011-10-25 DIAGNOSIS — E872 Acidosis, unspecified: Secondary | ICD-10-CM | POA: Diagnosis present

## 2011-10-25 DIAGNOSIS — E039 Hypothyroidism, unspecified: Secondary | ICD-10-CM

## 2011-10-25 DIAGNOSIS — N390 Urinary tract infection, site not specified: Secondary | ICD-10-CM

## 2011-10-25 DIAGNOSIS — F1021 Alcohol dependence, in remission: Secondary | ICD-10-CM

## 2011-10-25 DIAGNOSIS — R633 Feeding difficulties, unspecified: Secondary | ICD-10-CM

## 2011-10-25 DIAGNOSIS — C61 Malignant neoplasm of prostate: Secondary | ICD-10-CM

## 2011-10-25 DIAGNOSIS — R1310 Dysphagia, unspecified: Secondary | ICD-10-CM

## 2011-10-25 DIAGNOSIS — R82998 Other abnormal findings in urine: Secondary | ICD-10-CM | POA: Diagnosis present

## 2011-10-25 DIAGNOSIS — D62 Acute posthemorrhagic anemia: Secondary | ICD-10-CM

## 2011-10-25 DIAGNOSIS — A0472 Enterocolitis due to Clostridium difficile, not specified as recurrent: Principal | ICD-10-CM

## 2011-10-25 DIAGNOSIS — I10 Essential (primary) hypertension: Secondary | ICD-10-CM

## 2011-10-25 DIAGNOSIS — N3091 Cystitis, unspecified with hematuria: Secondary | ICD-10-CM

## 2011-10-25 DIAGNOSIS — D72829 Elevated white blood cell count, unspecified: Secondary | ICD-10-CM

## 2011-10-25 DIAGNOSIS — K5730 Diverticulosis of large intestine without perforation or abscess without bleeding: Secondary | ICD-10-CM

## 2011-10-25 DIAGNOSIS — B029 Zoster without complications: Secondary | ICD-10-CM

## 2011-10-25 DIAGNOSIS — S51019A Laceration without foreign body of unspecified elbow, initial encounter: Secondary | ICD-10-CM

## 2011-10-25 DIAGNOSIS — Z0000 Encounter for general adult medical examination without abnormal findings: Secondary | ICD-10-CM

## 2011-10-25 DIAGNOSIS — Z681 Body mass index (BMI) 19 or less, adult: Secondary | ICD-10-CM

## 2011-10-25 DIAGNOSIS — Z952 Presence of prosthetic heart valve: Secondary | ICD-10-CM

## 2011-10-25 DIAGNOSIS — K529 Noninfective gastroenteritis and colitis, unspecified: Secondary | ICD-10-CM

## 2011-10-25 DIAGNOSIS — I2510 Atherosclerotic heart disease of native coronary artery without angina pectoris: Secondary | ICD-10-CM

## 2011-10-25 DIAGNOSIS — I4891 Unspecified atrial fibrillation: Secondary | ICD-10-CM

## 2011-10-25 DIAGNOSIS — B3781 Candidal esophagitis: Secondary | ICD-10-CM

## 2011-10-25 DIAGNOSIS — G459 Transient cerebral ischemic attack, unspecified: Secondary | ICD-10-CM

## 2011-10-25 DIAGNOSIS — E785 Hyperlipidemia, unspecified: Secondary | ICD-10-CM

## 2011-10-25 DIAGNOSIS — L89109 Pressure ulcer of unspecified part of back, unspecified stage: Secondary | ICD-10-CM | POA: Diagnosis present

## 2011-10-25 DIAGNOSIS — Z228 Carrier of other infectious diseases: Secondary | ICD-10-CM

## 2011-10-25 DIAGNOSIS — E870 Hyperosmolality and hypernatremia: Secondary | ICD-10-CM

## 2011-10-25 DIAGNOSIS — R310 Gross hematuria: Secondary | ICD-10-CM

## 2011-10-25 DIAGNOSIS — E871 Hypo-osmolality and hyponatremia: Secondary | ICD-10-CM

## 2011-10-25 DIAGNOSIS — E875 Hyperkalemia: Secondary | ICD-10-CM

## 2011-10-25 DIAGNOSIS — E860 Dehydration: Secondary | ICD-10-CM

## 2011-10-25 DIAGNOSIS — E876 Hypokalemia: Secondary | ICD-10-CM

## 2011-10-25 DIAGNOSIS — L8990 Pressure ulcer of unspecified site, unspecified stage: Secondary | ICD-10-CM | POA: Diagnosis present

## 2011-10-25 DIAGNOSIS — K651 Peritoneal abscess: Secondary | ICD-10-CM

## 2011-10-25 DIAGNOSIS — R197 Diarrhea, unspecified: Secondary | ICD-10-CM

## 2011-10-25 DIAGNOSIS — R627 Adult failure to thrive: Secondary | ICD-10-CM | POA: Diagnosis present

## 2011-10-25 DIAGNOSIS — I739 Peripheral vascular disease, unspecified: Secondary | ICD-10-CM

## 2011-10-25 DIAGNOSIS — R410 Disorientation, unspecified: Secondary | ICD-10-CM

## 2011-10-25 DIAGNOSIS — Z931 Gastrostomy status: Secondary | ICD-10-CM

## 2011-10-25 DIAGNOSIS — R5381 Other malaise: Secondary | ICD-10-CM | POA: Diagnosis present

## 2011-10-25 DIAGNOSIS — K219 Gastro-esophageal reflux disease without esophagitis: Secondary | ICD-10-CM

## 2011-10-25 DIAGNOSIS — R296 Repeated falls: Secondary | ICD-10-CM

## 2011-10-25 DIAGNOSIS — I639 Cerebral infarction, unspecified: Secondary | ICD-10-CM

## 2011-10-25 DIAGNOSIS — N179 Acute kidney failure, unspecified: Secondary | ICD-10-CM

## 2011-10-25 DIAGNOSIS — B9620 Unspecified Escherichia coli [E. coli] as the cause of diseases classified elsewhere: Secondary | ICD-10-CM

## 2011-10-25 DIAGNOSIS — R630 Anorexia: Secondary | ICD-10-CM

## 2011-10-25 DIAGNOSIS — Z66 Do not resuscitate: Secondary | ICD-10-CM | POA: Diagnosis present

## 2011-10-25 DIAGNOSIS — E43 Unspecified severe protein-calorie malnutrition: Secondary | ICD-10-CM

## 2011-10-25 DIAGNOSIS — M899 Disorder of bone, unspecified: Secondary | ICD-10-CM

## 2011-10-25 DIAGNOSIS — Z8679 Personal history of other diseases of the circulatory system: Secondary | ICD-10-CM

## 2011-10-25 DIAGNOSIS — D649 Anemia, unspecified: Secondary | ICD-10-CM

## 2011-10-25 LAB — URINALYSIS, MICROSCOPIC ONLY
Glucose, UA: NEGATIVE mg/dL
Ketones, ur: 15 mg/dL — AB
Protein, ur: >300 mg/dL — AB

## 2011-10-25 LAB — COMPREHENSIVE METABOLIC PANEL
Alkaline Phosphatase: 162 U/L — ABNORMAL HIGH (ref 39–117)
BUN: 19 mg/dL (ref 6–23)
Chloride: 103 mEq/L (ref 96–112)
Creatinine, Ser: 0.88 mg/dL (ref 0.50–1.35)
GFR calc Af Amer: 90 mL/min — ABNORMAL LOW (ref >90)
Glucose, Bld: 68 mg/dL — ABNORMAL LOW (ref 70–99)
Potassium: 6 mEq/L — ABNORMAL HIGH (ref 3.5–5.1)
Total Bilirubin: 0.6 mg/dL (ref 0.3–1.2)

## 2011-10-25 LAB — CBC WITH DIFFERENTIAL/PLATELET
Basophils Absolute: 0 10*3/uL (ref 0.0–0.1)
Lymphocytes Relative: 8 % — ABNORMAL LOW (ref 12–46)
Neutro Abs: 9.1 10*3/uL — ABNORMAL HIGH (ref 1.7–7.7)
Neutrophils Relative %: 86 % — ABNORMAL HIGH (ref 43–77)
Platelets: 274 10*3/uL (ref 150–400)
RBC: 3.42 MIL/uL — ABNORMAL LOW (ref 4.22–5.81)
RDW: 17.2 % — ABNORMAL HIGH (ref 11.5–15.5)
WBC: 10.6 10*3/uL — ABNORMAL HIGH (ref 4.0–10.5)

## 2011-10-25 LAB — BASIC METABOLIC PANEL
BUN: 19 mg/dL (ref 6–23)
Calcium: 7.4 mg/dL — ABNORMAL LOW (ref 8.4–10.5)
Creatinine, Ser: 0.9 mg/dL (ref 0.50–1.35)
GFR calc Af Amer: 89 mL/min — ABNORMAL LOW (ref >90)
GFR calc non Af Amer: 77 mL/min — ABNORMAL LOW (ref >90)

## 2011-10-25 LAB — GLUCOSE, CAPILLARY: Glucose-Capillary: 58 mg/dL — ABNORMAL LOW (ref 70–99)

## 2011-10-25 MED ORDER — ALUM & MAG HYDROXIDE-SIMETH 200-200-20 MG/5ML PO SUSP: 30.0000 mL | Freq: Four times a day (QID) | ORAL | Status: DC | PRN

## 2011-10-25 MED ORDER — VANCOMYCIN 50 MG/ML ORAL SOLUTION: 125.0000 mg | ORAL | Status: DC

## 2011-10-25 MED ORDER — SODIUM CHLORIDE 0.9 % IJ SOLN
3.0000 mL | Freq: Two times a day (BID) | INTRAMUSCULAR | Status: DC
  Administered 2011-10-25 – 2011-10-30 (×4): 3 mL via INTRAVENOUS
  Administered 2011-10-30: 21:00:00 via INTRAVENOUS
  Administered 2011-10-31: 3 mL via INTRAVENOUS
  Administered 2011-10-31: 10 mL via INTRAVENOUS

## 2011-10-25 MED ORDER — ASPIRIN 81 MG PO TABS: 81.0000 mg | ORAL_TABLET | Freq: Every day | ORAL | Status: DC

## 2011-10-25 MED ORDER — CITALOPRAM HYDROBROMIDE 20 MG PO TABS
20.0000 mg | ORAL_TABLET | Freq: Every day | ORAL | Status: DC
  Administered 2011-10-26 – 2011-11-01 (×7): 20 mg
  Filled 2011-10-25 (×7): qty 1

## 2011-10-25 MED ORDER — SODIUM POLYSTYRENE SULFONATE 15 GM/60ML PO SUSP
30.0000 g | Freq: Once | ORAL | Status: AC
  Administered 2011-10-25: 30 g
  Filled 2011-10-25 (×2): qty 120

## 2011-10-25 MED ORDER — SODIUM CHLORIDE 0.9 % IV SOLN
INTRAVENOUS | Status: DC
  Administered 2011-10-25 – 2011-10-30 (×4): via INTRAVENOUS

## 2011-10-25 MED ORDER — SODIUM CHLORIDE 0.9 % IV BOLUS (SEPSIS)
1000.0000 mL | Freq: Once | INTRAVENOUS | Status: AC
  Administered 2011-10-25: 1000 mL via INTRAVENOUS

## 2011-10-25 MED ORDER — VANCOMYCIN 50 MG/ML ORAL SOLUTION
125.0000 mg | Freq: Two times a day (BID) | ORAL | Status: DC
  Filled 2011-10-25: qty 2.5

## 2011-10-25 MED ORDER — THIAMINE HCL 100 MG/ML IJ SOLN
100.0000 mg | Freq: Once | INTRAMUSCULAR | Status: AC
  Administered 2011-10-25: 100 mg via INTRAVENOUS
  Filled 2011-10-25: qty 2

## 2011-10-25 MED ORDER — AMIODARONE HCL 200 MG PO TABS
200.0000 mg | ORAL_TABLET | Freq: Two times a day (BID) | ORAL | Status: DC
  Administered 2011-10-25 – 2011-11-01 (×14): 200 mg
  Filled 2011-10-25 (×16): qty 1

## 2011-10-25 MED ORDER — SODIUM CHLORIDE 0.9 % IV SOLN: INTRAVENOUS | Status: DC

## 2011-10-25 MED ORDER — LEVOTHYROXINE SODIUM 25 MCG PO TABS
25.0000 ug | ORAL_TABLET | Freq: Every day | ORAL | Status: DC
  Administered 2011-10-26 – 2011-11-01 (×7): 25 ug
  Filled 2011-10-25 (×9): qty 1

## 2011-10-25 MED ORDER — VANCOMYCIN 50 MG/ML ORAL SOLUTION: 125.0000 mg | Freq: Every day | ORAL | Status: DC

## 2011-10-25 MED ORDER — ASPIRIN 81 MG PO CHEW
81.0000 mg | CHEWABLE_TABLET | Freq: Every day | ORAL | Status: DC
  Administered 2011-10-26: 81 mg
  Filled 2011-10-25: qty 1

## 2011-10-25 MED ORDER — FLUCONAZOLE 100 MG PO TABS
100.0000 mg | ORAL_TABLET | Freq: Every day | ORAL | Status: DC
  Administered 2011-10-26 – 2011-11-01 (×7): 100 mg
  Filled 2011-10-25 (×7): qty 1

## 2011-10-25 MED ORDER — LACTATED RINGERS IV BOLUS (SEPSIS)
500.0000 mL | Freq: Once | INTRAVENOUS | Status: AC
  Administered 2011-10-25: 500 mL via INTRAVENOUS

## 2011-10-25 MED ORDER — ACETAMINOPHEN 650 MG RE SUPP: 650.0000 mg | Freq: Four times a day (QID) | RECTAL | Status: DC | PRN

## 2011-10-25 MED ORDER — VANCOMYCIN 50 MG/ML ORAL SOLUTION
125.0000 mg | Freq: Four times a day (QID) | ORAL | Status: DC
  Administered 2011-10-25 – 2011-11-01 (×27): 125 mg
  Filled 2011-10-25 (×36): qty 2.5

## 2011-10-25 MED ORDER — MIRTAZAPINE 30 MG PO TABS
30.0000 mg | ORAL_TABLET | Freq: Every day | ORAL | Status: DC
  Administered 2011-10-25 – 2011-11-01 (×8): 30 mg
  Filled 2011-10-25 (×8): qty 1

## 2011-10-25 MED ORDER — OSMOLITE 1.5 CAL PO LIQD
237.0000 mL | Freq: Four times a day (QID) | ORAL | Status: DC
  Administered 2011-10-25 – 2011-10-26 (×2): 237 mL
  Filled 2011-10-25 (×5): qty 237

## 2011-10-25 MED ORDER — ONDANSETRON HCL 4 MG/2ML IJ SOLN: 4.0000 mg | Freq: Four times a day (QID) | INTRAMUSCULAR | Status: DC | PRN

## 2011-10-25 MED ORDER — SODIUM CHLORIDE 0.9 % IV SOLN
1.0000 g | Freq: Once | INTRAVENOUS | Status: DC
  Administered 2011-10-25: 1 g via INTRAVENOUS
  Filled 2011-10-25: qty 10

## 2011-10-25 MED ORDER — DEXTROSE 5 % IV SOLN
1.0000 g | Freq: Once | INTRAVENOUS | Status: AC
  Administered 2011-10-25: 1 g via INTRAVENOUS
  Filled 2011-10-25: qty 10

## 2011-10-25 NOTE — Telephone Encounter (Signed)
Caller: Audrey/Spouse; Patient Name: [name]; PCP: Oliver Barre (Adults only); Best Callback Phone Number: [phone];  THE PATIENT REFUSED 911 Calling regarding weakness that started 10/20/11 when he was discharged from hospital. He is at home now and unable to get up or out of the bed, states unable to do anything. Alert. States is so weak, thinks dehydrated. Has a feeding tube, has fallen 4 times since 10/20/11, had to call the paramedics to get pt up. Also complains of chest pain but fell on his chest 10/24/11, was laying on feeding tube and now sore. Advised 911 as per Weakness or Paralysis protocol due to new onset inability to sit or walk upright. States home health will be at his house on 10/25/11 at 12:00 pm and after that she will call. Unable to bring to office.

## 2011-10-25 NOTE — Progress Notes (Signed)
Wife concluded with her preference for residential hospice The pt would state he wanted to go to facility but later concluded that he wanted to review the written information with wife to make an informed decision.  Admitting MD at bedside at this time

## 2011-10-25 NOTE — Progress Notes (Signed)
[facility] ED Cm consulted by Dr Rennis Chris about home care and disposition concerns for pt (wife is not able to care for pt and requesting facility or hospice but pt has previously refused snf) Reviewed with EDP previous interaction and conversations with pt and his wife about options for disposition.  [facility]rist[facility] of [facility] confirms [facility] is active with [facility] and has reported voiced concern by [facility] about wife not being able to care for pt at home.  CM has attempted to speak with pt and wife x 2 without success.

## 2011-10-25 NOTE — ED Notes (Signed)
Pt arrived to ED by EMS, Wife said pt got admitted to the hospital for 7 days due to dehydration and was recently discharge. Since the hospital discharge, pt has not been feeling well, c/o generalize weakness, unable to take care of self, not able to ambulate. Wife requesting for Hospice consult, sts pt is Full Code.  Pt appeared weak and lack of energy. Vital sign stable.

## 2011-10-25 NOTE — Progress Notes (Signed)
[facility] ED CM spoke with pt and wife in ED rm 25 in detail about other disposition options: hospice residential, home hospice, and snf Reviewed and compared home health services to hospice services Informed them of palliative care consult ordered and palliative care evaluation process. Discussed again snf for 2-3 weeks, 2-3 months and residential services. Discussed private duty nursing services Discussed that all discussed d/c options are covered by medicare. Allowed pt to ventilate his feelings about present home health services and past snf services Discussed increase in level of care allowing 24 hr coverage for pt  Pt states he was previously at [facility] and refuses to return to [facility] Wife voiced interest in [facility] and [facility] facilities. Prepared pt for further contact from palliative care and SW staff He voiced understanding Assisted pt to bedpan to have a stool. Provided wife with CM business card, list of [facility] snfs, private duty nursing and home and residential home hospice information

## 2011-10-25 NOTE — Telephone Encounter (Signed)
Spoke with patient's wife and she states the patient has had a horrible weekend. He is much weaker and she does not think she can manage him at home. He is on a "cot" in the bedroom. She is having problems getting him back on the cot when he tries to get up to the [facility]. States he "flops" back on the cot half way and she is unable to get him positioned back on the cot. Today, he cannot get up to the [facility] due to weakness. He is c/o pain all over which is new for him. He tells her his arm, shoulder and legs are hurting. She is not sure if he has a fever. Last night, he felt so bad he would not let her do the tube feeding because he felt it would make him sick. She states the [facility] nurse is coming at noon today. Patient's wife does not think she can get the patient to the OV with Dr. Juanda Chance tomorrow due to the declining condition of the patient. Suggested she call EMS and go to ED if she felt he needed too. She is considering this but wants to wait until the home health nurse comes. I have notified Dr. Raphael Gibney office of this call also.

## 2011-10-25 NOTE — ED Provider Notes (Signed)
History     CSN: [id number]  Arrival date & time 10/25/11  1307   None     Chief Complaint  Patient presents with  . Extremity Weakness    (Consider location/radiation/quality/duration/timing/severity/associated sxs/prior treatment) HPI Patient with generalized weakness for several days accompanied by diarrhea. His wife is unable to care for him at home she is unable to assist him to bedside commode patient has had multiple episodes of diarrhea since discharge from hospital last month. Patient's eye but be fed by PEG tube no other complaint. Past Medical History  Diagnosis Date  . ADENOCARCINOMA, PROSTATE   . ALLERGIC RHINITIS   . B12 DEFICIENCY   . BENIGN PROSTATIC HYPERTROPHY   . CAROTID ARTERY DISEASE   . CORONARY ARTERY DISEASE   . DIVERTICULOSIS, COLON   . GERD   . HEMORRHOIDS   . HIATAL HERNIA   . HYPERLIPIDEMIA   . HYPERTENSION   . OSTEOPENIA   . PEPTIC ULCER DISEASE   . PERIPHERAL VASCULAR DISEASE   . Personal history of alcoholism   . TRANSIENT ISCHEMIC ATTACK   . Shingles   . Anemia   . AS (aortic stenosis)   . S/P AVR (aortic valve replacement) 09/23/2010  . Complication of anesthesia 09/23/2010    very confused after waking up. Stopped drinking 40 days before this surgery.  . Transfusion reaction, chill fever type 04/28/2011    Fever, rigors, tachycardia,tachypnea but no evidence of hemolysis  . Hemorrhagic cystitis 04/28/2011    Due to previous radiation for PSA recurrence of prostate cancer  . Blood transfusion   . Gross hematuria   . CVA (cerebrovascular accident) 08/29/2011    08/28/11  . C. difficile colitis     Past Surgical History  Procedure Date  . Popliteal synovial cyst excision 1998  . Carotid endarterectomy 1999    right, Left carotid total chronic occlusion  . Prostate surgery 2001  . Coronary angioplasty with stent placement 2004  . Aortic valve replacement 09/23/2010    #21mm [facility] Ease pericardial tissue valve  . Coronary  artery bypass graft 09/23/2010    CABG x1 using LIMA to LAD  . Cystoscopy with biopsy 02/15/2011    Procedure: CYSTOSCOPY WITH BIOPSY;  Surgeon: Anner Crete, MD;  Location: [facility]S;  Service: Urology;  Laterality: N/A;  Cystoscopy, biopsy and fulguration of the bladder  . Esophagogastroduodenoscopy 10/15/2011    Procedure: ESOPHAGOGASTRODUODENOSCOPY (EGD);  Surgeon: Meryl Dare, MD,FACG;  Location: [facility];  Service: Endoscopy;  Laterality: N/A;  . Peg placement 10/15/2011    Procedure: PERCUTANEOUS ENDOSCOPIC GASTROSTOMY (PEG) PLACEMENT;  Surgeon: Meryl Dare, MD,FACG;  Location: [facility];  Service: Endoscopy;  Laterality: N/A;    Family History  Problem Relation Age of Onset  . Colon cancer Mother 81  . Diabetes Sister   . Hypertension Sister   . Coronary artery disease Sister   . Multiple sclerosis Sister   . Stroke Brother     History  Substance Use Topics  . Smoking status: Former Smoker    Quit date: 02/14/1958  . Smokeless tobacco: Never Used  . Alcohol Use: No     last drink 3 days ago, he has not been drinking as much      Review of Systems  Constitutional: Positive for appetite change.  HENT: Negative.   Respiratory: Negative.   Cardiovascular: Negative.   Gastrointestinal: Positive for diarrhea.       Diarrhea daily fed by PEG tube;  Genitourinary:  Chronic indwelling Foley  Musculoskeletal: Negative.   Skin: Positive for wound.       Wife reports "bedsore"  Neurological: Positive for weakness.  Hematological: Negative.   Psychiatric/Behavioral: Negative.     Allergies  Tolterodine tartrate  Home Medications   Current Outpatient Rx  Name Route Sig Dispense Refill  . AMIODARONE HCL 200 MG PO TABS Feeding Tube 200 mg by Feeding Tube route 2 (two) times daily.     . ASPIRIN 81 MG PO TABS Feeding Tube 81 mg by Feeding Tube route daily.     Marland Kitchen CITALOPRAM HYDROBROMIDE 20 MG PO TABS Feeding Tube 20 mg by Feeding Tube route daily.    Marland Kitchen  DIPHENOXYLATE-ATROPINE 2.5-0.025 MG PO TABS Feeding Tube 1 tablet by Feeding Tube route 4 (four) times daily as needed. diarrhea    . FERROUS SULFATE 325 (65 FE) MG PO TABS Per Tube Place 1 tablet (325 mg total) into feeding tube daily with breakfast.    . FLUCONAZOLE 100 MG PO TABS Per Tube Place 1 tablet (100 mg total) into feeding tube daily. 12 tablet 0  . LEVOTHYROXINE SODIUM 25 MCG PO TABS Feeding Tube 25 mcg by Feeding Tube route daily before breakfast.     . MIRTAZAPINE 30 MG PO TABS Feeding Tube 30 mg by Feeding Tube route daily.    . ADULT MULTIVITAMIN W/MINERALS CH Feeding Tube 1 tablet by Feeding Tube route daily.     . OSMOLITE 1.5 CAL PO LIQD Per Tube Place 237 mLs into feeding tube 4 (four) times daily. Give 60 ml water flushes before and after each can of feed and give an additional 120 mL water flushes 3 times per day 12 Bottle 10  . OMEPRAZOLE 20 MG PO CPDR Feeding Tube 20 mg by Feeding Tube route 2 (two) times daily.     . K PHOS MONO-SOD PHOS DI & MONO 155-852-130 MG PO TABS Per Tube Place 1 tablet into feeding tube 4 (four) times daily. 30 tablet 0  . SACCHAROMYCES BOULARDII 250 MG PO CAPS Feeding Tube 250 mg by Feeding Tube route 2 (two) times daily.    . THIAMINE HCL 100 MG PO TABS Feeding Tube 100 mg by Feeding Tube route daily.       BP 120/70  Pulse 82  Temp 99 F (37.2 C) (Oral)  Resp 16  SpO2 100%  Physical Exam  Nursing note and vitals reviewed. Constitutional:       Cachectic chronically ill-appearing  HENT:  Head: Atraumatic.       Mucous membranes dry  Eyes: Conjunctivae normal are normal. Pupils are equal, round, and reactive to light.  Neck: Neck supple. No tracheal deviation present. No thyromegaly present.  Cardiovascular: Normal rate and regular rhythm.   No murmur heard. Pulmonary/Chest: Effort normal and breath sounds normal.  Abdominal: He exhibits no distension. There is no tenderness.       PEG tube in place  Genitourinary: Penis normal.        Foley catheter in place  Musculoskeletal: Normal range of motion. He exhibits no edema and no tenderness.       Muscular atrophy all 4  Neurological: He is alert. Coordination normal.  Skin: Skin is warm and dry. No rash noted.       Silver dollar size decubitus ulcer presacral area, minor skin breakdown of both heels  Psychiatric: He has a normal mood and affect.   Date: 10/25/2011  Rate: 80  Rhythm: normal sinus rhythm  QRS  Axis: normal  Intervals: normal  ST/T Wave abnormalities: nonspecific T wave changes  Conduction Disutrbances:none  Narrative Interpretation:   Old EKG Reviewed: unchanged Unchanged from 10/13/2011 interpreted by me Chest x-ray reviewed by me Results for orders placed during the hospital encounter of 10/25/11  COMPREHENSIVE METABOLIC PANEL      Component Value Range   Sodium 135  135 - 145 mEq/L   Potassium 6.0 (*) 3.5 - 5.1 mEq/L   Chloride 103  96 - 112 mEq/L   CO2 18 (*) 19 - 32 mEq/L   Glucose, Bld 68 (*) 70 - 99 mg/dL   BUN 19  6 - 23 mg/dL   Creatinine, Ser 1.61  0.50 - 1.35 mg/dL   Calcium 5.4 (*) 8.4 - 10.5 mg/dL   Total Protein 5.1 (*) 6.0 - 8.3 g/dL   Albumin 1.5 (*) 3.5 - 5.2 g/dL   AST 52 (*) 0 - 37 U/L   ALT 28  0 - 53 U/L   Alkaline Phosphatase 162 (*) 39 - 117 U/L   Total Bilirubin 0.6  0.3 - 1.2 mg/dL   GFR calc non Af Amer 77 (*) >90 mL/min   GFR calc Af Amer 90 (*) >90 mL/min  CBC WITH DIFFERENTIAL      Component Value Range   WBC 10.6 (*) 4.0 - 10.5 K/uL   RBC 3.42 (*) 4.22 - 5.81 MIL/uL   Hemoglobin 9.7 (*) 13.0 - 17.0 g/dL   HCT 09.6 (*) 04.5 - 40.9 %   MCV 84.8  78.0 - 100.0 fL   MCH 28.4  26.0 - 34.0 pg   MCHC 33.4  30.0 - 36.0 g/dL   RDW 81.1 (*) 91.4 - 78.2 %   Platelets 274  150 - 400 K/uL   Neutrophils Relative 86 (*) 43 - 77 %   Neutro Abs 9.1 (*) 1.7 - 7.7 K/uL   Lymphocytes Relative 8 (*) 12 - 46 %   Lymphs Abs 0.8  0.7 - 4.0 K/uL   Monocytes Relative 6  3 - 12 %   Monocytes Absolute 0.6  0.1 - 1.0 K/uL    Eosinophils Relative 0  0 - 5 %   Eosinophils Absolute 0.0  0.0 - 0.7 K/uL   Basophils Relative 0  0 - 1 %   Basophils Absolute 0.0  0.0 - 0.1 K/uL  URINALYSIS, MICROSCOPIC ONLY      Component Value Range   Color, Urine RED (*) YELLOW   APPearance TURBID (*) CLEAR   Specific Gravity, Urine 1.016  1.005 - 1.030   pH 8.0  5.0 - 8.0   Glucose, UA NEGATIVE  NEGATIVE mg/dL   Hgb urine dipstick LARGE (*) NEGATIVE   Bilirubin Urine SMALL (*) NEGATIVE   Ketones, ur 15 (*) NEGATIVE mg/dL   Protein, ur >956 (*) NEGATIVE mg/dL   Urobilinogen, UA 1.0  0.0 - 1.0 mg/dL   Nitrite POSITIVE (*) NEGATIVE   Leukocytes, UA LARGE (*) NEGATIVE   WBC, UA TOO NUMEROUS TO COUNT  <3 WBC/hpf   RBC / HPF 21-50  <3 RBC/hpf   Bacteria, UA MANY (*) RARE   Dg Chest 1 View  10/25/2011  *RADIOLOGY REPORT*  Clinical Data: Weakness and dehydration  CHEST - 1 VIEW  Comparison: 10/13/2011  Findings: Normal heart size.  Low lung volumes.  Bibasilar atelectasis.  Bronchitic changes of chronic appearance.  Chronic rib deformities on the right.  Postoperative changes.  No pneumothorax and no pleural effusion.  IMPRESSION: Bibasilar atelectasis.  Original Report Authenticated By: [name], M.D.    Dg Chest Portable 1 View  10/13/2011  *RADIOLOGY REPORT*  Clinical Data: Dehydration  PORTABLE CHEST - 1 VIEW  Comparison: 09/03/2011  Findings: Cardiomediastinal silhouette is stable.  Status post median sternotomy again noted.  No acute infiltrate or pulmonary edema.  Stable old right rib fractures.  IMPRESSION: No active disease.  Status post median sternotomy.  Stable old right rib fractures.   Original Report Authenticated By: Natasha Mead, M.D.      ED Course  Procedures (including critical care time)  Labs Reviewed - No data to display No results found.   No diagnosis found.    spoke with Dr. Ladona Ridgel from hospice palliative care who will consult on patient while in hospital spoke with Dr. Irene Limbo who will admit  patient MDM   Plan admit to telemetry IV hydration monitor electrolytes Antibiotics urine culture Diagnosis #1 dehydration #2 hypocalcemia #3 UTI #4 hyperkalemia #5 decubitus ulcers #6 weakness       Doug Sou, MD 10/25/11 1814

## 2011-10-25 NOTE — Telephone Encounter (Signed)
Ok for ER  - to call 911

## 2011-10-25 NOTE — ED Notes (Signed)
Enteric Contact precaution implemented.

## 2011-10-25 NOTE — H&P (Addendum)
History and Physical  [name] JXB:[id number] DOB: 10-31-1926 DOA: 10/25/2011  Referring physician: Ferdinand Lango, MD PCP: Oliver Barre, MD  GI: [name], MD  Chief Complaint: weakness  HPI:  78 year old man hospitalized 5 times in the last 7 months with complex PMH presented to ED today with wife with multiple complaints: generalized weakness, diarrhea, FTT, anorexia. Workup in ED notable for hyperkalemia, suspected metabolic acidosis, dehydration, anemia and suspected cdiff and referred for admission.  Most recently hospitalized in late September and discharged 10/2. Was treated for severe malnutrition, cdiff colitis and PEG tube was placed. SNF was strongly recommended by PT, MD, CSW but family refused. Since being at home patient has been on a cot downstairs and has not moved from it until today. He has not ambulated since discharge and his attempts at standing have resulted in falls. He remains anorexic and will not eat anything his wife prepares for him. He has continued to have diarrhea (perhaps 2x/day, though history is vague and wife cannot confirm frequency) and never filled vancomycin for unclear reasons. He has been on glucerna TF. Has tolerated TF without pain, nausea or vomiting. He and his wife want to pursue SNF and hospice.  In ED--afebrile, vitals stable. Potassium 6.0 (slight hemolysis), CO2 18, Hgb 9/7, WBC 10.6, glucose 68, urine grossly positive. Urine culture and cdiff PCR sent. CXR negative. EKG--SR no acute changes. Foley was exchanged.  Chart Review: Hospitalizations  10/2011--Severe malnutrition, s/p PEG placement, Esophageal Candidiasis, C. Diff infection, dehydration  08/2011--acute CVA, c diff colitis  07/2011--ABLA, hemorrhagic cystitis  05/2011--ABLA, hemorrhagic cystitis, intra-abdominal abscess, cdiff colitis  04/2011--ABLA, hemorrhagic cystitis, HCAP  Dr. Ardell Isaacs recent consult provides a concise overview: "very difficult time after undergoing aortic  valve replacement in September of 2012. Prior to that time he weighed 183 pounds and is now down to 132 pounds. His course was complicated by atrial fibrillation, hemorrhagic cystitis, development of pelvic abscess in May of 2013 unclear whether this was related to a microperforation of the bladder or the colon, this required I&D area He then developed C. difficile colitis and was treated for that in August. He has had a steady weight loss over the past 12 months."  Review of Systems:  Negative for fever, visual changes, sore throat, rash, new muscle aches, chest pain, SOB, dysuria, bleeding, n/v/abdominal pain.  Past Medical History  Diagnosis Date  . ADENOCARCINOMA, PROSTATE   . ALLERGIC RHINITIS   . B12 DEFICIENCY   . BENIGN PROSTATIC HYPERTROPHY   . CAROTID ARTERY DISEASE   . CORONARY ARTERY DISEASE   . DIVERTICULOSIS, COLON   . GERD   . HEMORRHOIDS   . HIATAL HERNIA   . HYPERLIPIDEMIA   . HYPERTENSION   . OSTEOPENIA   . PEPTIC ULCER DISEASE   . PERIPHERAL VASCULAR DISEASE   . Personal history of alcoholism   . TRANSIENT ISCHEMIC ATTACK   . Shingles   . Anemia   . AS (aortic stenosis)   . S/P AVR (aortic valve replacement) 09/23/2010  . Complication of anesthesia 09/23/2010    very confused after waking up. Stopped drinking 40 days before this surgery.  . Transfusion reaction, chill fever type 04/28/2011    Fever, rigors, tachycardia,tachypnea but no evidence of hemolysis  . Hemorrhagic cystitis 04/28/2011    Due to previous radiation for PSA recurrence of prostate cancer  . Blood transfusion   . Gross hematuria   . CVA (cerebrovascular accident) 08/29/2011    08/28/11  . C. difficile  colitis     Past Surgical History  Procedure Date  . Popliteal synovial cyst excision 1998  . Carotid endarterectomy 1999    right, Left carotid total chronic occlusion  . Prostate surgery 2001  . Coronary angioplasty with stent placement 2004  . Aortic valve replacement 09/23/2010    #92mm  [facility] Ease pericardial tissue valve  . Coronary artery bypass graft 09/23/2010    CABG x1 using LIMA to LAD  . Cystoscopy with biopsy 02/15/2011    Procedure: CYSTOSCOPY WITH BIOPSY;  Surgeon: Anner Crete, MD;  Location: [facility];  Service: Urology;  Laterality: N/A;  Cystoscopy, biopsy and fulguration of the bladder  . Esophagogastroduodenoscopy 10/15/2011    Procedure: ESOPHAGOGASTRODUODENOSCOPY (EGD);  Surgeon: Meryl Dare, MD,FACG;  Location: [facility];  Service: Endoscopy;  Laterality: N/A;  . Peg placement 10/15/2011    Procedure: PERCUTANEOUS ENDOSCOPIC GASTROSTOMY (PEG) PLACEMENT;  Surgeon: Meryl Dare, MD,FACG;  Location: [facility];  Service: Endoscopy;  Laterality: N/A;    Social History:  reports that he quit smoking about 53 years ago. He has never used smokeless tobacco. He reports that he does not drink alcohol or use illicit drugs.  Allergies  Allergen Reactions  . Tolterodine Tartrate     Reaction=unknown    Family History  Problem Relation Age of Onset  . Colon cancer Mother 82  . Diabetes Sister   . Hypertension Sister   . Coronary artery disease Sister   . Multiple sclerosis Sister   . Stroke Brother      Prior to Admission medications   Medication Sig Start Date End Date Taking? Authorizing Provider  amiodarone (PACERONE) 200 MG tablet 200 mg by Feeding Tube route 2 (two) times daily.  05/28/11 05/27/12 Yes Rollene Rotunda, MD  aspirin 81 MG tablet 81 mg by Feeding Tube route daily.    Yes Historical Provider, MD  citalopram (CELEXA) 20 MG tablet 20 mg by Feeding Tube route daily. 09/17/11 09/16/12 Yes Corwin Levins, MD  diphenoxylate-atropine (LOMOTIL) 2.5-0.025 MG per tablet 1 tablet by Feeding Tube route 4 (four) times daily as needed. diarrhea   Yes Historical Provider, MD  ferrous sulfate 325 (65 FE) MG tablet Place 1 tablet (325 mg total) into feeding tube daily with breakfast. 10/18/11 10/17/12 Yes Clanford Cyndie Mull, MD  fluconazole  (DIFLUCAN) 100 MG tablet Place 1 tablet (100 mg total) into feeding tube daily. 10/18/11  Yes Clanford Cyndie Mull, MD  levothyroxine (SYNTHROID, LEVOTHROID) 25 MCG tablet 25 mcg by Feeding Tube route daily before breakfast.  08/24/11 08/23/12 Yes Corwin Levins, MD  mirtazapine (REMERON) 30 MG tablet 30 mg by Feeding Tube route daily. 10/08/11  Yes Corwin Levins, MD  Multiple Vitamin (MULITIVITAMIN WITH MINERALS) TABS 1 tablet by Feeding Tube route daily.    Yes Historical Provider, MD  Nutritional Supplements (FEEDING SUPPLEMENT, OSMOLITE 1.5 CAL,) LIQD Place 237 mLs into feeding tube 4 (four) times daily. Give 60 ml water flushes before and after each can of feed and give an additional 120 mL water flushes 3 times per day 10/18/11  Yes Clanford Cyndie Mull, MD  omeprazole (PRILOSEC) 20 MG capsule 20 mg by Feeding Tube route 2 (two) times daily.    Yes Historical Provider, MD  phosphorus (K-PHOS-NEUTRAL) [phone] MG tablet Place 1 tablet into feeding tube 4 (four) times daily. 10/18/11  Yes Clanford Cyndie Mull, MD  saccharomyces boulardii (FLORASTOR) 250 MG capsule 250 mg by Feeding Tube route 2 (two) times daily. 10/18/11  Yes Clanford Cyndie Mull, MD  thiamine 100 MG tablet 100 mg by Feeding Tube route daily.  07/21/11 07/20/12 Yes Corwin Levins, MD   Physical Exam: Filed Vitals:   10/25/11 1338 10/25/11 1538 10/25/11 1722  BP: 120/70 141/78 140/113  Pulse: 82 87 80  Temp: 99 F (37.2 C)  97.5 F (36.4 C)  TempSrc: Oral  Oral  Resp: 16 16 16   SpO2: 100% 100% 100%    General:  Appears calm and comfortable. Examined in ED. Severely malnourished with muscle wasting, debilitated but NAD. Eyes: PERRL, normal lids, irises  ENT: grossly normal hearing. Lips very dry, tongue unremarkable. Poor dentition. Neck: no LAD, masses or thyromegaly Cardiovascular: RRR, no m/r/g. No LE edema. Respiratory: CTA bilaterally, no w/r/r. Normal respiratory effort. Abdomen: soft, ntnd, thin; FT. Skin: no rash, 6cm sacral  decubitus Musculoskeletal: globally weak but able to sit up and roll over with minimal assistance, residual left-sided weakness from previous CVA Psychiatric: grossly normal mood and affect, speech fluent and appropriate Neurologic: grossly non-focal.  Wt Readings from Last 3 Encounters:  10/20/11 62.143 kg (137 lb)  10/20/11 62.143 kg (137 lb)  10/12/11 59.875 kg (132 lb)   Labs on Admission:  Basic Metabolic Panel:  Lab 10/25/11 1610 10/20/11 0502 10/19/11 0333  NA 135 132* --  K 6.0* 3.7 --  CL 103 101 --  CO2 18* 24 --  GLUCOSE 68* 71 --  BUN 19 13 --  CREATININE 0.88 0.65 --  CALCIUM 5.4* 7.5* --  MG -- 1.9 1.7  PHOS -- 3.6 1.7*    Liver Function Tests:  Lab 10/25/11 1620 10/20/11 0502  AST 52* 33  ALT 28 15  ALKPHOS 162* 174*  BILITOT 0.6 0.5  PROT 5.1* 4.7*  ALBUMIN 1.5* 1.5*   CBC:  Lab 10/25/11 1620 10/20/11 0502  WBC 10.6* 7.8  NEUTROABS 9.1* --  HGB 9.7* 10.4*  HCT 29.0* 30.3*  MCV 84.8 83.2  PLT 274 286   CBG:  Lab 10/21/11 0742  GLUCAP 236*     Radiological Exams on Admission: Dg Chest 1 View  10/25/2011  *RADIOLOGY REPORT*  Clinical Data: Weakness and dehydration  CHEST - 1 VIEW  Comparison: 10/13/2011  Findings: Normal heart size.  Low lung volumes.  Bibasilar atelectasis.  Bronchitic changes of chronic appearance.  Chronic rib deformities on the right.  Postoperative changes.  No pneumothorax and no pleural effusion.  IMPRESSION: Bibasilar atelectasis.   Original Report Authenticated By: [name], M.D.     Principal Problem:  *Hyperkalemia Active Problems:  Atrial fibrillation  Severe malnutrition  Dehydration  C. difficile colitis   Assessment/Plan 78 year old man presents with hyperkalemia, dehydration, suspected C diff colitis and severe deconditioning.  1. Hyperkalemia--likely secondary to supplementation. Stop K-phos. S/p kayexalate, calcium gluconate, IVF in ED. EKG without potassium related changes. Slight hemolysis  noted; given this will hold off on further treatment with insulin (also hypoglycemic) and repeat BMP. IVF. Corrected calcium 7.4 2. Dehydration--IVF 3. Suspected metabolic acidosis--likely secondary to diarrhea. IVF, treat for presumed C diff. 4. Diarrhea/suspected c diff colitis--start oral vancomycin, follow-up cdiff PCR. 5. hypoglcyemia--mild, recheck. 6. Pyuria--chronic indwelling foley, suspect colonization (cultures 9/20, 8/16, 8/8 suggest same). Given lack of toxicity will await culture, but would hesitate to treat unless unstable given recurrent cdiff. 7. Severe deconditioning--PT 8. FTT, severe malnutrition, weight loss--PT, continue TF. 9. S/p AVR 09/2010 10. History of atrial fibrillation--continue amiodarone, ASA 11. History of hemorrhagic cystitis 12. Sacral decubitis ulcer--present on admission.  No evidence of infection.  Patient and wife interested in SNF and hospice, interested in PMT consultation (placed).  Code Status: Full code, confirmed with patient and wife. I discussed how CPR is conducted and that it would likely result in significant morbidity in survival secondary to cachectic state. He expressed understanding. Family Communication: with wife at bedside, discussed above Disposition Plan/Anticipated LOS: 2-3 days  Time spent: 65 minutes  Brendia Sacks, MD  [facility] Team 6 Pager [phone]. If 8PM-8AM, please contact night-coverage at [email or web address], password [facility] 10/25/2011, 5:55 PM

## 2011-10-25 NOTE — ED Notes (Signed)
Wife sts pt has C-diff diarrhea

## 2011-10-25 NOTE — ED Notes (Signed)
Pt moved to a private room, Contact precaution implemented.

## 2011-10-25 NOTE — ED Notes (Signed)
Pt was never moved off the floor, it was an error in charting.

## 2011-10-25 NOTE — Telephone Encounter (Signed)
Called the patient left message to call back 

## 2011-10-25 NOTE — ED Notes (Signed)
Will move pt to a more private room due to possibility of c-diff.

## 2011-10-25 NOTE — Telephone Encounter (Signed)
He needs to call Dr [name] and go to ER, will likely need an admission

## 2011-10-25 NOTE — ED Notes (Signed)
Pt is a tele patient, 3 E is not a tele floor. Will request for bed

## 2011-10-25 NOTE — ED Notes (Signed)
Pt present to ED with a old foley, old foley removed and new foley placed.

## 2011-10-25 NOTE — Progress Notes (Signed)
Clinical Social Work Department BRIEF PSYCHOSOCIAL ASSESSMENT 10/25/2011  Patient:  [name], [name]     Account Number:  [id number]     Admit date:  10/25/2011  Clinical Social Worker:  Doree Albee  Date/Time:  10/25/2011 07:30 PM  Referred by:  RN  Date Referred:  10/25/2011 Referred for  SNF Placement  Residential hospice placement   Other Referral:   Interview type:  Patient Other interview type:    PSYCHOSOCIAL DATA Living Status:  FAMILY Admitted from facility:   Level of care:   Primary support name:  Heywood Iles Primary support relationship to patient:  SPOUSE Degree of support available:   strong    CURRENT CONCERNS Current Concerns  Post-Acute Placement   Other Concerns:    SOCIAL WORK ASSESSMENT / PLAN CSW recieved referral for potential disposition needs including skilled nursing and residential hospice placement. Per chart review, and discussion with rn case manager, pt is being admitted and has a pllaitive care consult for goals of care.    CSW met with pt at bedside to complete psychosocial assessment and assist with pt dc plans when pt is medically stable.    Pt shared that he lives at home with his spouse who provides all of his care. Pt stated that Rn case manger, pt, and pt wife discussed different options of skilled nursing, residential hospice, home hospice, and home services. Pt [facility] recieves HH [facility]. Pt stated that pt spouse left to go home and has a lot of information, and he trusts her to make the best decision.    Pt shared that he does not like nursing homes, but understands that may be where he will need to go for extra care. Pt stated, "If I make it through this, I will appreciate knownig that health is all that matters in life."    CSW provided emotional support to patient. Patient stated that he was cold, csw assist wtih patient blankets to cover patients feet sticking out from the edge of covers.      Per discussion and chart review, pt spouse is interested in [place] or [place] placed, as well as [facility] if residential hospice is approrpriate. Pt has been to [facility] and does not wish to return.    CSW attempted to contact pt spouse. CSW left message for pt spouse.   Assessment/plan status:  Psychosocial Support/Ongoing Assessment of Needs Other assessment/ plan:   and discharge planning   Information/referral to community resources:   Rn Sports coach provided pt spouse with skilled nursing facility list and residential hospice list.    PATIENT'S/FAMILY'S RESPONSE TO PLAN OF CARE: Pt thanked csw for concern and support. Pt agreed to continue to discuss further disposition needs with unit csw.      CSW will continue to follow to assist with pt dc plans once level of care determined.       Catha Gosselin, LCSWA  [phone] .10/25/2011 1940pm

## 2011-10-25 NOTE — ED Notes (Signed)
Unable to hang calcium gluconate at this time because pt has his IV on his hand instead of forearm or above. IV team has been paged and is coming. Order sts to not infused in the hand IV

## 2011-10-25 NOTE — Telephone Encounter (Signed)
Spoke with patient's wife and gave her Dr. Brodie's recommendations. 

## 2011-10-25 NOTE — ED Notes (Signed)
Admission MD wants the Kayexalate to be infuse through the feeding tube.

## 2011-10-26 ENCOUNTER — Ambulatory Visit: Payer: Medicare Other | Admitting: Internal Medicine

## 2011-10-26 DIAGNOSIS — D649 Anemia, unspecified: Secondary | ICD-10-CM

## 2011-10-26 DIAGNOSIS — IMO0002 Reserved for concepts with insufficient information to code with codable children: Secondary | ICD-10-CM | POA: Diagnosis present

## 2011-10-26 DIAGNOSIS — R627 Adult failure to thrive: Secondary | ICD-10-CM

## 2011-10-26 LAB — BASIC METABOLIC PANEL
BUN: 18 mg/dL (ref 6–23)
CO2: 22 mEq/L (ref 19–32)
Calcium: 7.1 mg/dL — ABNORMAL LOW (ref 8.4–10.5)
Creatinine, Ser: 0.81 mg/dL (ref 0.50–1.35)
Glucose, Bld: 106 mg/dL — ABNORMAL HIGH (ref 70–99)

## 2011-10-26 LAB — CBC
HCT: 23 % — ABNORMAL LOW (ref 39.0–52.0)
MCH: 29 pg (ref 26.0–34.0)
MCHC: 33.9 g/dL (ref 30.0–36.0)
MCV: 85.5 fL (ref 78.0–100.0)
Platelets: 372 10*3/uL (ref 150–400)
RDW: 17.1 % — ABNORMAL HIGH (ref 11.5–15.5)
WBC: 7.1 10*3/uL (ref 4.0–10.5)

## 2011-10-26 MED ORDER — POTASSIUM CHLORIDE CRYS ER 20 MEQ PO TBCR
40.0000 meq | EXTENDED_RELEASE_TABLET | Freq: Two times a day (BID) | ORAL | Status: AC
  Administered 2011-10-26 (×2): 40 meq via ORAL
  Filled 2011-10-26 (×2): qty 2

## 2011-10-26 MED ORDER — ACETAMINOPHEN 160 MG/5ML PO SOLN
650.0000 mg | Freq: Four times a day (QID) | ORAL | Status: DC | PRN
  Administered 2011-10-28: 650 mg via ORAL
  Filled 2011-10-26: qty 20.3

## 2011-10-26 NOTE — Progress Notes (Signed)
[facility] PROGRESS NOTE  [name]:[id number] DOB: Apr 20, 1926 DOA: 10/25/2011 PCP: Oliver Barre, MD GI: [name], MD--notified of admission 10/8  Assessment/Plan: 1. C diff colitis--continue oral vancomycin, would favor taper as outpatient. 2 large stools documented. 2. Dehydration--improved, continue IVF. 3. Suspected metabolic acidosis--appears resolved, likely secondary to diarrhea.                              4. Hypoglycemia--suspect secondary to poor intake (though tolerating tube feeds at home). Last episode 10/7 PM. Monitor CBGs.  5. Pyuria--chronic indwelling foley, suspect colonization (cultures 9/20, 8/16, 8/8 suggest same). Given lack of toxicity will await culture, but would hesitate to treat unless unstable given recurrent cdiff. 6. Normocytic anemia--lower than admission and recent studies, suspect dilutional. Monitor for recurrence of clinically significant hemorrhagic cystitis. Hold ASA, but restart when stabilizes.  7. Hyperkalemia--resolved, likely secondary to supplementation. K-phos stopped. 8. Severe deconditioning--PT 9. FTT, severe malnutrition, weight loss--PT, continue TF. 10. S/p AVR 09/2010 11. History of atrial fibrillation--continue amiodarone, ASA 12. History of hemorrhagic cystitis 13. Sacral decubitis ulcer--present on admission. No evidence of infection.  PMT meeting today.   Code Status: Full code Family Communication: none present Disposition Plan/Anticipated LOS: 2-3 days   Brendia Sacks, MD  [facility] Team 6 Pager [phone]. If 8PM-8AM, please contact night-coverage at [email or web address], password [facility] 10/26/2011, 10:40 AM  LOS: 1 day   Brief narrative: 78 year old man hospitalized 5 times in the last 7 months with complex PMH presented to ED today with wife with multiple complaints: generalized weakness, diarrhea, FTT, anorexia. Workup in ED notable for hyperkalemia, suspected metabolic acidosis, dehydration, anemia and  suspected cdiff and referred for admission.  Consultants:  Palliative Medicien  Procedures:    Antibiotics:  Oral vancomycin 10/7 >>  HPI/Subjective: "I feel better." Denies nausea, vomiting, abdominal pain. No complaints. No pain.  Objective: Filed Vitals:   10/25/11 1722 10/25/11 2002 10/25/11 2028 10/26/11 0524  BP: 140/113 148/89 140/84 115/66  Pulse: 80 89 85 77  Temp: 97.5 F (36.4 C)  98.5 F (36.9 C) 98.2 F (36.8 C)  TempSrc: Oral  Oral Oral  Resp: 16 15 16 18   Height:   5\' 11"  (1.803 m)   Weight:   59.7 kg (131 lb 9.8 oz)   SpO2: 100% 92% 93% 99%    Intake/Output Summary (Last 24 hours) at 10/26/11 1040 Last data filed at 10/26/11 0700  Gross per 24 hour  Intake   1737 ml  Output    250 ml  Net   1487 ml   Filed Weights   10/25/11 2028  Weight: 59.7 kg (131 lb 9.8 oz)    Exam:  General:  Appears calm and comfortable Cardiovascular: RRR, no m/r/g. No LE edema. Telemetry: SR, NSST 3 beats Respiratory: CTA bilaterally, no w/r/r. Normal respiratory effort. Abdomen: soft, ntnd, PEG appears in place Skin: no change, sacral decubitus not examined today Psychiatric: grossly normal mood and affect, speech fluent and appropriate  Data Reviewed: Basic Metabolic Panel:  Lab 10/26/11 8657 10/25/11 2123 10/25/11 1620 10/20/11 0502  NA 140 139 135 132*  K 3.1* 4.0 6.0* 3.7  CL 110 107 103 101  CO2 22 22 18* 24  GLUCOSE 106* 67* 68* 71  BUN 18 19 19 13   CREATININE 0.81 0.90 0.88 0.65  CALCIUM 7.1* 7.4* 5.4* 7.5*  MG -- -- -- 1.9  PHOS -- -- -- 3.6   Liver Function  Tests:  Lab 10/25/11 1620 10/20/11 0502  AST 52* 33  ALT 28 15  ALKPHOS 162* 174*  BILITOT 0.6 0.5  PROT 5.1* 4.7*  ALBUMIN 1.5* 1.5*   CBC:  Lab 10/26/11 0500 10/25/11 1620 10/20/11 0502  WBC 7.1 10.6* 7.8  NEUTROABS -- 9.1* --  HGB 7.8* 9.7* 10.4*  HCT 23.0* 29.0* 30.3*  MCV 85.5 84.8 83.2  PLT 372 274 286   CBG:  Lab 10/26/11 0803 10/26/11 0313 10/25/11 2245 10/21/11  0742  GLUCAP 80 97 58* 236*    Recent Results (from the past 240 hour(s))  CLOSTRIDIUM DIFFICILE BY PCR     Status: Abnormal   Collection Time   10/25/11  6:10 PM      Component Value Range Status Comment   C difficile by pcr POSITIVE (*) NEGATIVE Final      Studies: Dg Chest 1 View  10/25/2011  *RADIOLOGY REPORT*  Clinical Data: Weakness and dehydration  CHEST - 1 VIEW  Comparison: 10/13/2011  Findings: Normal heart size.  Low lung volumes.  Bibasilar atelectasis.  Bronchitic changes of chronic appearance.  Chronic rib deformities on the right.  Postoperative changes.  No pneumothorax and no pleural effusion.  IMPRESSION: Bibasilar atelectasis.   Original Report Authenticated By: [name], M.D.     Scheduled Meds:   . amiodarone  200 mg Per Tube BID  . aspirin  81 mg Per Tube Daily  . cefTRIAXone (ROCEPHIN)  IV  1 g Intravenous Once  . citalopram  20 mg Per Tube Daily  . feeding supplement (OSMOLITE 1.5 CAL)  237 mL Per Tube QID  . fluconazole  100 mg Per Tube Daily  . lactated ringers  500 mL Intravenous Once  . levothyroxine  25 mcg Per Tube QAC breakfast  . mirtazapine  30 mg Per Tube Daily  . potassium chloride  40 mEq Oral BID  . sodium chloride  1,000 mL Intravenous Once  . sodium chloride  3 mL Intravenous Q12H  . sodium polystyrene  30 g Per Tube Once  . thiamine  100 mg Intravenous Once  . vancomycin  125 mg Per Tube QID   Followed by  . vancomycin  125 mg Per Tube BID   Followed by  . vancomycin  125 mg Per Tube Daily   Followed by  . vancomycin  125 mg Oral QODAY   Followed by  . vancomycin  125 mg Per Tube Q3 days  . DISCONTD: sodium chloride   Intravenous STAT  . DISCONTD: aspirin  81 mg Per Tube Daily  . DISCONTD: calcium gluconate 1 GM IV  1 g Intravenous Once   Continuous Infusions:   . sodium chloride 125 mL/hr at 10/26/11 0458    Principal Problem:  *Hyperkalemia Active Problems:  Atrial fibrillation  Severe malnutrition  Dehydration   C. difficile colitis     Brendia Sacks, MD  [facility] Team 6 Pager [phone]. If 8PM-8AM, please contact night-coverage at [email or web address], password [facility] 10/26/2011, 10:40 AM  LOS: 1 day   Time spent: 25 minutes

## 2011-10-26 NOTE — Progress Notes (Signed)
INITIAL ADULT NUTRITION ASSESSMENT Date: 10/26/2011   Time: 2:26 PM Reason for Assessment: Nutrition risk, home TF  INTERVENTION: Recommend comfort feeds per pt and family wishes. Wife requests pt get Pepcid instead of Prilosec because it is easier to put through PEG tube. Will monitor.   Pt meets criteria for severe malnutrition of chronic illness AEB 28.4% weight loss in the past year, with 10.2% weight loss in the past 1-2 months and pt with severe muscle wasting and subcutaneous fat loss in upper and lower extremities as well as sternal region.  ASSESSMENT: Male 78 y.o.  Dx: Hyperkalemia  Food/Nutrition Related Hx: Pt admitted last month with weight loss and failure to thrive. Pt weighed 183 pounds last year, 146 pounds in August 2013, now at 131 pounds. Pt had PEG placed during previous admission with goal of 6 cans/day of Osmolite 1.5 with 2130 calories, 89g protein, and free water, however wife states since discharge she has only been managing getting in 3-4 cans/day of Osmolite 1.5 via PEG because pt would sometimes refuse the TF. Pt getting between 1065-1420 calories, 45-60g protein, and 543-724 free water - meeting 57-76% calorie needs and 50-67% protein needs. Pt had c/o stomach pain at night at home. Pt had goals of care today and wife wanted to stop TF and comfort feeds desired.   Hx:  Past Medical History  Diagnosis Date  . ADENOCARCINOMA, PROSTATE   . ALLERGIC RHINITIS   . B12 DEFICIENCY   . BENIGN PROSTATIC HYPERTROPHY   . CAROTID ARTERY DISEASE   . CORONARY ARTERY DISEASE   . DIVERTICULOSIS, COLON   . GERD   . HEMORRHOIDS   . HIATAL HERNIA   . HYPERLIPIDEMIA   . HYPERTENSION   . OSTEOPENIA   . PEPTIC ULCER DISEASE   . PERIPHERAL VASCULAR DISEASE   . Personal history of alcoholism   . TRANSIENT ISCHEMIC ATTACK   . Shingles   . Anemia   . AS (aortic stenosis)   . S/P AVR (aortic valve replacement) 09/23/2010  . Complication of anesthesia 09/23/2010    very  confused after waking up. Stopped drinking 40 days before this surgery.  . Transfusion reaction, chill fever type 04/28/2011    Fever, rigors, tachycardia,tachypnea but no evidence of hemolysis  . Hemorrhagic cystitis 04/28/2011    Due to previous radiation for PSA recurrence of prostate cancer  . Blood transfusion   . Gross hematuria   . CVA (cerebrovascular accident) 08/29/2011    08/28/11  . C. difficile colitis    Related Meds:  Scheduled Meds:   . amiodarone  200 mg Per Tube BID  . cefTRIAXone (ROCEPHIN)  IV  1 g Intravenous Once  . citalopram  20 mg Per Tube Daily  . fluconazole  100 mg Per Tube Daily  . lactated ringers  500 mL Intravenous Once  . levothyroxine  25 mcg Per Tube QAC breakfast  . mirtazapine  30 mg Per Tube Daily  . potassium chloride  40 mEq Oral BID  . sodium chloride  1,000 mL Intravenous Once  . sodium chloride  3 mL Intravenous Q12H  . sodium polystyrene  30 g Per Tube Once  . thiamine  100 mg Intravenous Once  . vancomycin  125 mg Per Tube QID   Followed by  . vancomycin  125 mg Per Tube BID   Followed by  . vancomycin  125 mg Per Tube Daily   Followed by  . vancomycin  125 mg Oral QODAY  Followed by  . vancomycin  125 mg Per Tube Q3 days  . DISCONTD: sodium chloride   Intravenous STAT  . DISCONTD: aspirin  81 mg Per Tube Daily  . DISCONTD: aspirin  81 mg Per Tube Daily  . DISCONTD: calcium gluconate 1 GM IV  1 g Intravenous Once  . DISCONTD: feeding supplement (OSMOLITE 1.5 CAL)  237 mL Per Tube QID   Continuous Infusions:   . sodium chloride 125 mL/hr at 10/26/11 0458   PRN Meds:.acetaminophen (TYLENOL) oral liquid 160 mg/5 mL, alum & mag hydroxide-simeth, ondansetron (ZOFRAN) IV, DISCONTD: acetaminophen  Ht: 5\' 11"  (180.3 cm)  Wt: 131 lb 9.8 oz (59.7 kg)  Ideal Wt: 172 lb % Ideal Wt: 76  Usual Wt: 183 lb last year per MD notes % Usual Wt: 71  Body mass index is 18.36 kg/(m^2).   Labs:  CMP     Component Value Date/Time   NA  140 10/26/2011 0500   K 3.1* 10/26/2011 0500   CL 110 10/26/2011 0500   CO2 22 10/26/2011 0500   GLUCOSE 106* 10/26/2011 0500   BUN 18 10/26/2011 0500   CREATININE 0.81 10/26/2011 0500   CREATININE 1.05 01/29/2011 1708   CALCIUM 7.1* 10/26/2011 0500   PROT 5.1* 10/25/2011 1620   ALBUMIN 1.5* 10/25/2011 1620   AST 52* 10/25/2011 1620   ALT 28 10/25/2011 1620   ALKPHOS 162* 10/25/2011 1620   BILITOT 0.6 10/25/2011 1620   GFRNONAA 80* 10/26/2011 0500   GFRAA >90 10/26/2011 0500    Intake/Output Summary (Last 24 hours) at 10/26/11 1433 Last data filed at 10/26/11 1054  Gross per 24 hour  Intake   1737 ml  Output    250 ml  Net   1487 ml   Last BM -  10/8 diarrhea   Diet Order: NPO   IVF:    sodium chloride Last Rate: 125 mL/hr at 10/26/11 0458    Estimated Nutritional Needs:   Kcal:1850-2250 Protein:90-110g Fluid:1.8-2.2L  NUTRITION DIAGNOSIS: -Malnutrition (NI-5.2).  Status: Ongoing  RELATED TO: declining appetite  AS EVIDENCE BY: significant weight loss and prolonged inadequate oral intake  MONITORING/EVALUATION(Goals): Comfort feeds per pt and family wishes.   EDUCATION NEEDS: -No education needs identified at this time   Dietitian #: [phone]  DOCUMENTATION CODES Per approved criteria  -Severe malnutrition in the context of chronic illness -Underweight    Marshall Cork 10/26/2011, 2:26 PM

## 2011-10-26 NOTE — Consult Note (Signed)
[facility] Liaison: Received request from CSW [place] for family interest in [facility]. Chart reviewed, note, SLP consult pending, appreciate Dr. Lubertha Basque note. Spouse not present at this time. Friends present at bedside. RN reports patient getting ready to transfer to PCU. Will attempt phone contact with spouse and/or follow up in AM. Thank you. Forrestine Him LCSW [phone]

## 2011-10-26 NOTE — Progress Notes (Signed)
CSW met with patients wife at bedside. Discussed her desire for residential hospice placement. She requests placement at [facility]. CSW made referral to [facility] liason. Will follow.  Dayan Desa C. Arval Brandstetter MSW, LCSW [phone]

## 2011-10-26 NOTE — Progress Notes (Addendum)
Patient ZH:YQMV ARMIN YERGER      DOB: 08/18/1926      HQI:[id number]     Consult Note from the Palliative Medicine Team at [facility]    Consult Requested by: Dr. Irene Limbo    PCP: Oliver Barre, MD Reason for Consultation: GOC     Phone Number:[phone]  Assessment of patients Current state:Patient is an 78 year old white male who has been failing to thrive for some time.  He was seen in the last month for worsening symptoms with recurrent bouts of dehydration.  His wife reports he has a little dementia which has confounded the process because she is trying to let him make his own choices but he clear does not have full capacity to do so.  He returns on this visit with the inability to be cared for at home.  He has been sleeping on a cot on the first floor of his home because he refused a hospital bed on the last admission.  He has been being treated for Cdiff but might night have been getting his meds.  The patient himself told me this morning that he was not doing well at home.  He is at time confused .  His wife and I met with the support of her sister.  She feels that he remains doing poorly and even if they continued to feed by the PEG he will not survive.  She would like to consider Residential Hospice with comfort feeds.  She also feels at this time that she would select DNR status on his behalf as she feels that he does not have full understanding of his diagnosis. I reviewed the case with Dr. [name].  He concurs that we have likely reached our limit with regard to meaningful recover and would support residential hospice placement.  He states he would gladly act as the patient's primary    Goals of Care: 1.  Code Status: DNR/ DNI   2. Scope of Treatment: 1. Vital Signs: continue routine for now  2. Respiratory/Oxygen: No issues at present, use oxygen only for comfort prn 3. Nutritional Support/Tube Feeds: Patient's wife would like to stop TF for nutritional support , she favors comfort  feeding best texture. We will ask speech to eval for best texture.  The patient himself states he really doesn't want anything to eat. 4. Antibiotics: continue vancomycin via tube for comfort . IV antibiotics can discontinue at discharge 5. Blood Products: None 6. OZD:[id number] to prevent volume overload.  IV will discontinue at discharge 7. Review of Medications to be discontinued: stop tube feeds 8. Labs: stop CBG, And labs will not be necessary 9. Telemetry: can be discontinued and patient can move to PCU if primary MD and family ok with this 10. Consults: Speech evaluate for best texture for oral feeds.   4. Disposition: I have contacted social work for [employer] choice   3. Symptom Management:   1. Anxiety/Agitation: not necessary at this time, but can use ativan 0.5 mg q 4 hrs prn if needed.  Please call to request if symptoms change. 2. Pain: None at present.  Can use Roxanol 5mg /5ml via PEG tube q 2-4 hours prn if needed. 3. Bowel Regimen: diarrhea remains present .  Continue Vancomycin for comfort 4. Delirium: intermittently confused from dementia but not delirium.  Prn meds as needed. 5. Fever: tylenol can be changed to elixir 6. Nausea/Vomiting: no complaint of such, but can use prn meds 7. Terminal Secretions: none at present  in fact very dehydrated.  Increase mouth care  4. Psychosocial: Patient states he found his wife under a tree in the dark dancing.  They have no children.   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  He is also an Tree surgeon. He had a studio in his barn where he would listen to opera and draw.  He loved his motorcycle but has not been able to ride since his stroke affected his left arm.    5. Spiritual: We did not talk much about this, but I will offer chaplain services  Patient Documents Completed or Given: Document  Given Completed  Advanced Directives Pkt    MOST    DNR    Gone from My Sight    Hard Choices      Brief HPI: 78 yr old with persistent c diff and failure to thrive.  Patient returns with weakness and worsening dehydration.  He was found to have persistent diarrhea,  Hyperkalemia,  And anemia.  Family can not care for him at home.   ROS: Denies pain, nausea, and states he is not hungry.    PMH:  Past Medical History  Diagnosis Date  . ADENOCARCINOMA, PROSTATE   . ALLERGIC RHINITIS   . B12 DEFICIENCY   . BENIGN PROSTATIC HYPERTROPHY   . CAROTID ARTERY DISEASE   . CORONARY ARTERY DISEASE   . DIVERTICULOSIS, COLON   . GERD   . HEMORRHOIDS   . HIATAL HERNIA   . HYPERLIPIDEMIA   . HYPERTENSION   . OSTEOPENIA   . PEPTIC ULCER DISEASE   . PERIPHERAL VASCULAR DISEASE   . Personal history of alcoholism   . TRANSIENT ISCHEMIC ATTACK   . Shingles   . Anemia   . AS (aortic stenosis)   . S/P AVR (aortic valve replacement) 09/23/2010  . Complication of anesthesia 09/23/2010    very confused after waking up. Stopped drinking 40 days before this surgery.  . Transfusion reaction, chill fever type 04/28/2011    Fever, rigors, tachycardia,tachypnea but no evidence of hemolysis  . Hemorrhagic cystitis 04/28/2011    Due to previous radiation for PSA recurrence of prostate cancer  . Blood transfusion   . Gross hematuria   . CVA (cerebrovascular accident) 08/29/2011    08/28/11  . C. difficile colitis      PSH: Past Surgical History  Procedure Date  . Popliteal synovial cyst excision 1998  . Carotid endarterectomy 1999    right, Left carotid total chronic occlusion  . Prostate surgery 2001  . Coronary angioplasty with stent placement 2004  . Aortic valve replacement 09/23/2010    #53mm [facility] Ease pericardial tissue valve  . Coronary artery bypass graft 09/23/2010    CABG x1 using LIMA to LAD  . Cystoscopy with biopsy 02/15/2011    Procedure: CYSTOSCOPY WITH BIOPSY;  Surgeon:  Anner Crete, MD;  Location: [facility];  Service: Urology;  Laterality: N/A;  Cystoscopy, biopsy and fulguration of the bladder  . Esophagogastroduodenoscopy 10/15/2011    Procedure: ESOPHAGOGASTRODUODENOSCOPY (EGD);  Surgeon: Meryl Dare, MD,FACG;  Location: [facility];  Service: Endoscopy;  Laterality: N/A;  . Peg placement 10/15/2011    Procedure: PERCUTANEOUS ENDOSCOPIC GASTROSTOMY (PEG) PLACEMENT;  Surgeon: Meryl Dare, MD,FACG;  Location: [facility];  Service: Endoscopy;  Laterality: N/A;   I have reviewed the  FH and SH and  If appropriate update it with new information. Allergies  Allergen Reactions  . Tolterodine Tartrate     Reaction=unknown   Scheduled Meds:   . amiodarone  200 mg Per Tube BID  . cefTRIAXone (ROCEPHIN)  IV  1 g Intravenous Once  . citalopram  20 mg Per Tube Daily  . feeding supplement (OSMOLITE 1.5 CAL)  237 mL Per Tube QID  . fluconazole  100 mg Per Tube Daily  . lactated ringers  500 mL Intravenous Once  . levothyroxine  25 mcg Per Tube QAC breakfast  . mirtazapine  30 mg Per Tube Daily  . potassium chloride  40 mEq Oral BID  . sodium chloride  1,000 mL Intravenous Once  . sodium chloride  3 mL Intravenous Q12H  . sodium polystyrene  30 g Per Tube Once  . thiamine  100 mg Intravenous Once  . vancomycin  125 mg Per Tube QID   Followed by  . vancomycin  125 mg Per Tube BID   Followed by  . vancomycin  125 mg Per Tube Daily   Followed by  . vancomycin  125 mg Oral QODAY   Followed by  . vancomycin  125 mg Per Tube Q3 days  . DISCONTD: sodium chloride   Intravenous STAT  . DISCONTD: aspirin  81 mg Per Tube Daily  . DISCONTD: aspirin  81 mg Per Tube Daily  . DISCONTD: calcium gluconate 1 GM IV  1 g Intravenous Once   Continuous Infusions:   . sodium chloride 125 mL/hr at 10/26/11 0458   PRN Meds:.acetaminophen, alum & mag hydroxide-simeth, ondansetron (ZOFRAN) IV    BP 103/62  Pulse 79  Temp 97.4 F (36.3 C) (Oral)  Resp 18  Ht 5'  11" (1.803 m)  Wt 59.7 kg (131 lb 9.8 oz)  BMI 18.36 kg/m2  SpO2 99%   PPS:10 %, severe protein calorie malnutrition Albumin 1.5 , Weight down form 183 in 2012 to 131 currently.    Intake/Output Summary (Last 24 hours) at 10/26/11 1253 Last data filed at 10/26/11 1054  Gross per 24 hour  Intake   1737 ml  Output    250 ml  Net   1487 ml   LBM:        2x yesterday, 1 x today              Stool Softner: not needed  Physical Exam:  General: Cachectic elderly male,  Somnolent. Will intermittently rouse but goes right back to sleep HEENT:  PERRL, EOMI,  Sunken eyes, mucous membranes very dry Chest:  Decreased but clear,no rhonchi or rales CVS: regular,rate and rhythm, S1, S2 Abdomen: scaphoid soft, not tender or distended, PEG in place Ext: cachectic, several erythematous toes with superficial skin lesion, multiple skin tears Neuro: intermittently rousable, not completely oriented but he tries to answer questions.  Labs: CBC    Component Value Date/Time   WBC 7.1 10/26/2011 0500   RBC 2.69* 10/26/2011 0500   HGB 7.8* 10/26/2011 0500   HCT 23.0* 10/26/2011 0500   PLT 372 10/26/2011 0500   MCV 85.5 10/26/2011 0500   MCH 29.0 10/26/2011 0500   MCHC 33.9 10/26/2011 0500   RDW 17.1* 10/26/2011 0500   LYMPHSABS 0.8 10/25/2011 1620   MONOABS 0.6 10/25/2011 1620   EOSABS 0.0 10/25/2011 1620   BASOSABS 0.0 10/25/2011 1620     CMP     Component Value Date/Time   NA 140 10/26/2011 0500  K 3.1* 10/26/2011 0500   CL 110 10/26/2011 0500   CO2 22 10/26/2011 0500   GLUCOSE 106* 10/26/2011 0500   BUN 18 10/26/2011 0500   CREATININE 0.81 10/26/2011 0500   CREATININE 1.05 01/29/2011 1708   CALCIUM 7.1* 10/26/2011 0500   PROT 5.1* 10/25/2011 1620   ALBUMIN 1.5* 10/25/2011 1620   AST 52* 10/25/2011 1620   ALT 28 10/25/2011 1620   ALKPHOS 162* 10/25/2011 1620   BILITOT 0.6 10/25/2011 1620   GFRNONAA 80* 10/26/2011 0500   GFRAA >90 10/26/2011 0500    Chest Xray Reviewed/Impressions: bibasilar  atelectasis      Time In Time Out Total Time Spent with Patient Total Overall Time  1130 am 1215 pm 45 min 45 min   Discussed with Dr. Irene Limbo,  Left message for SW. Spoke with Dr. [name]  Greater than 50%  of this time was spent counseling and coordinating care related to the above assessment and plan.  Kinze Labo L. Ladona Ridgel, MD MBA The Palliative Medicine Team at [facility] Phone: [phone] Pager: [phone]

## 2011-10-26 NOTE — Progress Notes (Signed)
Patient [name]      DOB: 27-Oct-1926      EAV:[id number]  Patient seen and examined.  Spouse not at the bedside.  I called her and she agrees to meet me at 1130 for goals of care.  Kamauri Denardo L. Ladona Ridgel, MD MBA The Palliative Medicine Team at [facility] Phone: [phone] Pager: [phone]

## 2011-10-26 NOTE — Telephone Encounter (Signed)
Called the patients wife Magda Paganini informed of MD instructions.  She stated the patient is now at [facility].

## 2011-10-26 NOTE — Progress Notes (Signed)
Received voice message from Dr Ladona Ridgel, palliative care MD. CM returned her call and left a voice message with CM contact information  Noted MD scheduled to meet with wife at [facility] 10/26/11

## 2011-10-26 NOTE — Consult Note (Addendum)
WOC consult Note Reason for Consult:sacral pressure ulcer Wound type:Pressure ulcer Pressure Ulcer POA: Yes Measurement:3.5cm x 2.5cm   Depth is unable to be determined due to presence of soft yellow necrotic slough covering wound bed.  Also noted is 1cm x 2.5cm area of darker discoloration (purple) which may indicate an area of suspected deep tissue injury (sDTI). Wound ZOX:[id number] above. Drainage (amount, consistency, odor) none noted at this time. Periwound:mild erythema in the periwound area to 1cm. Dressing procedure/placement/frequency:Patient is awaiting transfer to palliative care unit at this time.  An air overlay mattress has been ordered.  I will add bilateral Prevalon Boots for pressure redistribution to the heels as they are with non-blanching erythema (Stage I) upon inspection.  Turing and repositioning per our protocol is indicated as is placement of a soft silicone foam dressing to the unstageable pressure ulcer on the sacrum. I will remain available to this patient and his medical and nursing staff, but will not follow.  Please re-consult if needed. Thanks, Ladona Mow, MSN, RN, [facility], CWOCN [phone])

## 2011-10-26 NOTE — Progress Notes (Signed)
[facility] ED CM spoke with Dr Ladona Ridgel after she met with wife.  Informed pt is residential hospice candidate and unit SW to assist

## 2011-10-26 NOTE — Progress Notes (Addendum)
Pt received from ED, bedtime CBG 58.  Pt received feeding supplement, Osmolite 1.5cal via Peg tube 237 mL. CBG rechecked up to 97. [name] Poag, NP made aware. Will continue to monitor. Newman Nip [place]

## 2011-10-27 DIAGNOSIS — N3090 Cystitis, unspecified without hematuria: Secondary | ICD-10-CM

## 2011-10-27 DIAGNOSIS — R630 Anorexia: Secondary | ICD-10-CM

## 2011-10-27 DIAGNOSIS — R1310 Dysphagia, unspecified: Secondary | ICD-10-CM

## 2011-10-27 DIAGNOSIS — N179 Acute kidney failure, unspecified: Secondary | ICD-10-CM

## 2011-10-27 NOTE — Care Management Note (Unsigned)
    Page 1 of 1   10/27/2011     12:40:47 PM   CARE MANAGEMENT NOTE 10/27/2011  Patient:  [name], [name]   Account Number:  [id number]  Date Initiated:  10/27/2011  Documentation initiated by:  Lanier Clam  Subjective/Objective Assessment:   ADMITTED W/HYPERKALEMIA     Action/Plan:   FROM HOME.ACTIVE [facility]   Anticipated DC Date:  10/30/2011   Anticipated DC Plan:  HOSPICE MEDICAL FACILITY  In-house referral  Clinical Social Worker      DC Planning Services  CM consult      Choice offered to / List presented to:             Status of service:  In process, will continue to follow Medicare Important Message given?   (If response is "NO", the following Medicare IM given date fields will be blank) Date Medicare IM given:   Date Additional Medicare IM given:    Discharge Disposition:    Per UR Regulation:  Reviewed for med. necessity/level of care/duration of stay  If discussed at Long Length of Stay Meetings, dates discussed:    Comments:  10/27/11 Raynold Blankenbaker RN,BSN NCM [phone] TRANSFERRED TO PALLIATIVE.FOR RESIDENTIAL HOSPICE @ D/C.

## 2011-10-27 NOTE — Progress Notes (Signed)
[facility] PROGRESS NOTE  [name]:[id number] DOB: [birth year] DOA: 10/25/2011 PCP: Oliver Barre, MD GI: [name], MD--notified of admission 10/8  Assessment/Plan: 1. C diff colitis--continue oral vancomycin, would favor taper as outpatient. 2 large stools documented. 2. Dehydration--improved, continue IVF. 3. Suspected metabolic acidosis--appears resolved, likely secondary to diarrhea.                              4. Hypoglycemia--suspect secondary to poor intake (though tolerating tube feeds at home). Last episode 10/7 PM. Monitor CBGs.  5. Pyuria--chronic indwelling foley, suspect colonization (cultures 9/20, 8/16, 8/8 suggest same). Given lack of toxicity will await culture, but would hesitate to treat unless unstable given recurrent cdiff. 6. Normocytic anemia--lower than admission and recent studies, suspect dilutional. Monitor for recurrence of clinically significant hemorrhagic cystitis. Hold ASA, but restart when stabilizes.  7. Hyperkalemia--resolved, likely secondary to supplementation. K-phos stopped. 8. Severe deconditioning--PT 9. FTT, severe malnutrition, weight loss--PT, continue TF. 10. S/p AVR 09/2010 11. History of atrial fibrillation--continue amiodarone, ASA 12. History of hemorrhagic cystitis 13. Sacral decubitis ulcer--present on admission. No evidence of infection. 14. Disposition--Appreciate palliative care consult. Note plans for more of a comfort care approach. Currently pursuing a spot at residential hospice.    Code Status: DNR   Peggye Pitt, MD [facility] Pager: [phone]  f 8PM-8AM, please contact night-coverage at [email or web address], password [facility] 10/27/2011, 2:27 PM  LOS: 2 days   Brief narrative: 78 year old man hospitalized 5 times in the last 7 months with complex PMH presented to ED today with wife with multiple complaints: generalized weakness, diarrhea, FTT, anorexia. Workup in ED notable for hyperkalemia, suspected  metabolic acidosis, dehydration, anemia and suspected cdiff and referred for admission.  Consultants:  Palliative Mediciene  Procedures:    Antibiotics:  Oral vancomycin 10/7 >>  HPI/Subjective: "I feel better." Denies nausea, vomiting, abdominal pain. No complaints. No pain.  Objective: Filed Vitals:   10/26/11 0524 10/26/11 1120 10/26/11 1631 10/27/11 0543  BP: 115/66 103/62 133/69 122/70  Pulse: 77 79 86 76  Temp: 98.2 F (36.8 C) 97.4 F (36.3 C) 98.2 F (36.8 C) 98.7 F (37.1 C)  TempSrc: Oral Oral Oral Oral  Resp: 18 18 20 18   Height:      Weight:      SpO2: 99% 99% 97% 98%    Intake/Output Summary (Last 24 hours) at 10/27/11 1427 Last data filed at 10/27/11 1300  Gross per 24 hour  Intake      0 ml  Output   1850 ml  Net  -1850 ml   Filed Weights   10/25/11 2028  Weight: 59.7 kg (131 lb 9.8 oz)    Exam:  General:  Appears calm and comfortable Cardiovascular: RRR, no m/r/g. No LE edema. Telemetry: SR, NSST 3 beats Respiratory: CTA bilaterally, no w/r/r. Normal respiratory effort. Abdomen: soft, ntnd, PEG appears in place Skin: no change, sacral decubitus not examined today Psychiatric: grossly normal mood and affect, speech fluent and appropriate  Data Reviewed: Basic Metabolic Panel:  Lab 10/26/11 8657 10/25/11 2123 10/25/11 1620  NA 140 139 135  K 3.1* 4.0 6.0*  CL 110 107 103  CO2 22 22 18*  GLUCOSE 106* 67* 68*  BUN 18 19 19   CREATININE 0.81 0.90 0.88  CALCIUM 7.1* 7.4* 5.4*  MG -- -- --  PHOS -- -- --   Liver Function Tests:  Lab 10/25/11 1620  AST 52*  ALT  28  ALKPHOS 162*  BILITOT 0.6  PROT 5.1*  ALBUMIN 1.5*   CBC:  Lab 10/26/11 0500 10/25/11 1620  WBC 7.1 10.6*  NEUTROABS -- 9.1*  HGB 7.8* 9.7*  HCT 23.0* 29.0*  MCV 85.5 84.8  PLT 372 274   CBG:  Lab 10/26/11 1639 10/26/11 1145 10/26/11 0803 10/26/11 0313 10/25/11 2245  GLUCAP 100* 106* 80 97 58*    Recent Results (from the past 240 hour(s))  URINE  CULTURE     Status: Normal (Preliminary result)   Collection Time   10/25/11  4:20 PM      Component Value Range Status Comment   Specimen Description URINE, CLEAN CATCH   Final    Special Requests NONE   Final    Culture  Setup Time 10/26/2011 01:23   Final    Colony Count PENDING   Incomplete    Culture Culture reincubated for better growth   Final    Report Status PENDING   Incomplete   CLOSTRIDIUM DIFFICILE BY PCR     Status: Abnormal   Collection Time   10/25/11  6:10 PM      Component Value Range Status Comment   C difficile by pcr POSITIVE (*) NEGATIVE Final      Studies: Dg Chest 1 View  10/25/2011  *RADIOLOGY REPORT*  Clinical Data: Weakness and dehydration  CHEST - 1 VIEW  Comparison: 10/13/2011  Findings: Normal heart size.  Low lung volumes.  Bibasilar atelectasis.  Bronchitic changes of chronic appearance.  Chronic rib deformities on the right.  Postoperative changes.  No pneumothorax and no pleural effusion.  IMPRESSION: Bibasilar atelectasis.   Original Report Authenticated By: [name], M.D.     Scheduled Meds:    . amiodarone  200 mg Per Tube BID  . citalopram  20 mg Per Tube Daily  . fluconazole  100 mg Per Tube Daily  . levothyroxine  25 mcg Per Tube QAC breakfast  . mirtazapine  30 mg Per Tube Daily  . potassium chloride  40 mEq Oral BID  . sodium chloride  3 mL Intravenous Q12H  . vancomycin  125 mg Per Tube QID   Followed by  . vancomycin  125 mg Per Tube BID   Followed by  . vancomycin  125 mg Per Tube Daily   Followed by  . vancomycin  125 mg Oral QODAY   Followed by  . vancomycin  125 mg Per Tube Q3 days   Continuous Infusions:    . sodium chloride 50 mL/hr at 10/27/11 0846    Principal Problem:  *Hyperkalemia Active Problems:  Atrial fibrillation  Severe malnutrition  Dehydration  C. difficile colitis  Failure to thrive    Peggye Pitt, MD [facility] Pager: [phone]  f 8PM-8AM, please contact night-coverage at  [email or web address], password [facility] 10/27/2011, 2:27 PM  LOS: 2 days   Time spent: 25 minutes

## 2011-10-27 NOTE — Consult Note (Signed)
[facility] Liaison: Met with Mrs. [name], confirmed interest in [employer] and answered questions. She is eager for outcome of SLP consult. Mr. [name] in good spirits this morning, showing his sense of humor. Mrs. [name] has my contact information. Will follow back up later today. Thank you. Forrestine Him LCSW [phone]

## 2011-10-27 NOTE — Evaluation (Signed)
Clinical/Bedside Swallow Evaluation Patient Details  Name: [name] MRN: [id number] Date of Birth: [birth year]  Today's Date: 10/27/2011 Time: 4696-2952 SLP Time Calculation (min): 51 min  Past Medical History:  Past Medical History  Diagnosis Date  . ADENOCARCINOMA, PROSTATE   . ALLERGIC RHINITIS   . B12 DEFICIENCY   . BENIGN PROSTATIC HYPERTROPHY   . CAROTID ARTERY DISEASE   . CORONARY ARTERY DISEASE   . DIVERTICULOSIS, COLON   . GERD   . HEMORRHOIDS   . HIATAL HERNIA   . HYPERLIPIDEMIA   . HYPERTENSION   . OSTEOPENIA   . PEPTIC ULCER DISEASE   . PERIPHERAL VASCULAR DISEASE   . Personal history of alcoholism   . TRANSIENT ISCHEMIC ATTACK   . Shingles   . Anemia   . AS (aortic stenosis)   . S/P AVR (aortic valve replacement) 09/23/2010  . Complication of anesthesia 09/23/2010    very confused after waking up. Stopped drinking 40 days before this surgery.  . Transfusion reaction, chill fever type 04/28/2011    Fever, rigors, tachycardia,tachypnea but no evidence of hemolysis  . Hemorrhagic cystitis 04/28/2011    Due to previous radiation for PSA recurrence of prostate cancer  . Blood transfusion   . Gross hematuria   . CVA (cerebrovascular accident) 08/29/2011    08/28/11  . C. difficile colitis    Past Surgical History:  Past Surgical History  Procedure Date  . Popliteal synovial cyst excision 1998  . Carotid endarterectomy 1999    right, Left carotid total chronic occlusion  . Prostate surgery 2001  . Coronary angioplasty with stent placement 2004  . Aortic valve replacement 09/23/2010    #57mm [facility] Ease pericardial tissue valve  . Coronary artery bypass graft 09/23/2010    CABG x1 using LIMA to LAD  . Cystoscopy with biopsy 02/15/2011    Procedure: CYSTOSCOPY WITH BIOPSY;  Surgeon: Anner Crete, MD;  Location: [facility];  Service: Urology;  Laterality: N/A;  Cystoscopy, biopsy and fulguration of the bladder  . Esophagogastroduodenoscopy 10/15/2011   Procedure: ESOPHAGOGASTRODUODENOSCOPY (EGD);  Surgeon: Meryl Dare, MD,FACG;  Location: [facility];  Service: Endoscopy;  Laterality: N/A;  . Peg placement 10/15/2011    Procedure: PERCUTANEOUS ENDOSCOPIC GASTROSTOMY (PEG) PLACEMENT;  Surgeon: Meryl Dare, MD,FACG;  Location: [facility];  Service: Endoscopy;  Laterality: N/A;   HPI:  78 yo old male adm to [facility] with dehydration, FTT and persistent Cdiff.  PMH + for prostate cancer, PVA, TIA, PUD, HTN, HLD, HH, CAD, GERD, ? dementia.  Plan is to have pt discontinue PEG feedings and initiate oral intake.  Swallow evaluation was ordered to help find best textures.  Pt familiar to this SLP from August 2013 admission when he presented with oral holding and concern or aspiration of  po.  Per spouse, pt received a PEG during last admission due to continued weight loss even with tube feeds.     Assessment / Plan / Recommendation Clinical Impression  Pt presents with mild oral dysphagia without clinical indicators of aspiration.  Pt observed SELF feeding icecream and coke demonstrating timely swallow and clear voice.  Pt was not able to masticate soft cheese stick due to ill fitting dentures causing pain when he attempts to masticate.  SLP removed adhered cheese from removed denture.  Pt clearly expressed desire for oyster soup and a banana split with ripe bananas.  Advise to initiate mechanical soft diet to allow wife and pt to select soups, etc  that pt can consume.  Educated pt and wife to aspiration precautions and importance of checking for oral stasis - given pt has a h/o oral holding.  Encouraged pt to self feed to allow neural input for maximal airway protection.  Spouse states pt has said that food tastes bad - today pt indicated pleasure with po.     Aspiration Risk  Mild    Diet Recommendation Dysphagia 3 (Mechanical Soft);Thin liquid (allowing soups, etc- modify as needed)   Liquid Administration via: Straw;Cup Medication Administration:   (whole with icecream- pt does not like applesauce) Supervision: Patient able to self feed;Full supervision/cueing for compensatory strategies Compensations: Slow rate;Small sips/bites;Check for pocketing Postural Changes and/or Swallow Maneuvers: Seated upright 90 degrees;Upright 30-60 min after meal    Other  Recommendations Oral Care Recommendations: Oral care before and after PO   Follow Up Recommendations  None    Frequency and Duration        Pertinent Vitals/Pain Afebrile, decreased    SLP Swallow Goals  n/a eval and d'c  Swallow Study Prior Functional Status       General Date of Onset: 10/27/11 HPI: 78 yo old male adm to [facility] with dehydration, FTT and persistent Cdiff.  PMH + for prostate cancer, PVA, TIA, PUD, HTN, HLD, HH, CAD, GERD, ? dementia.  Plan is to have pt discontinue PEG feedings and initiate oral intake.  Swallow evaluation was ordered to help find best textures.  Pt familiar to this SLP from August 2013 admission when he presented with oral holding and concern or aspiration of  po.  Per spouse, pt received a PEG during last admission due to continued weight loss even with tube feeds.   Type of Study: Bedside swallow evaluation Previous Swallow Assessment: August 2013 rec d3/thin Diet Prior to this Study: NPO Temperature Spikes Noted: No Respiratory Status: Room air History of Recent Intubation: No Behavior/Cognition: Alert;Cooperative;Pleasant mood;Impulsive (appeared to try to get oob a few times) Oral Cavity - Dentition: Edentulous (upper denture ill fitting- even with denture adhesive) Self-Feeding Abilities: Able to feed self (with monitoring to prevent spillage) Patient Positioning: Upright in bed Baseline Vocal Quality: Clear Volitional Cough: Strong Volitional Swallow: Unable to elicit    Oral/Motor/Sensory Function Overall Oral Motor/Sensory Function:  (no focal cn deficits)   Ice Chips Ice chips: Not tested   Thin Liquid Thin Liquid: Within  functional limits Presentation: Cup;Self Fed;Straw    Nectar Thick Nectar Thick Liquid: Not tested   Honey Thick Honey Thick Liquid: Not tested   Puree Puree: Within functional limits Presentation: Self Fed;Spoon Other Comments: ICECREAM - approx 2 ounces self fed -   pt declined applesauce stating he doesn't like it   Solid   GO    Solid: Impaired Presentation: Self Fed Oral Phase Impairments: Other (comment) (inability to masticate due to pt's dentures and pain ) Oral Phase Functional Implications: Oral residue (SLP removed cheese adhered to denture) Other Comments: pt states masticating hurts him - he also complained of this with last admission       [name], MS [facility] SLP [phone]

## 2011-10-28 DIAGNOSIS — R197 Diarrhea, unspecified: Secondary | ICD-10-CM

## 2011-10-28 LAB — URINE CULTURE

## 2011-10-28 MED ORDER — MORPHINE SULFATE 2 MG/ML IJ SOLN
0.5000 mg | INTRAMUSCULAR | Status: DC | PRN
  Administered 2011-10-28: 0.5 mg via INTRAVENOUS
  Filled 2011-10-28: qty 1

## 2011-10-28 MED ORDER — BACITRACIN-NEOMYCIN-POLYMYXIN 400-5-5000 EX OINT
TOPICAL_OINTMENT | CUTANEOUS | Status: AC
  Administered 2011-10-28: 1
  Filled 2011-10-28: qty 1

## 2011-10-28 NOTE — Plan of Care (Signed)
Problem: Phase I Progression Outcomes Goal: Initial discharge plan identified Outcome: Progressing SNF.     

## 2011-10-28 NOTE — Progress Notes (Signed)
[facility] PROGRESS NOTE  [name] XBJ:[id number] DOB: 05/28/1926 DOA: 10/25/2011 PCP: Oliver Barre, MD GI: [name], MD--notified of admission 10/8  Assessment/Plan: 1. C diff colitis--continue oral vancomycin, would favor taper as outpatient. 2 large stools documented. 2. Dehydration--improved, continue IVF. 3. Suspected metabolic acidosis--appears resolved, likely secondary to diarrhea.                              4. Hypoglycemia--suspect secondary to poor intake (though tolerating tube feeds at home). Last episode 10/7 PM. Monitor CBGs.  5. Pyuria--chronic indwelling foley, suspect colonization (cultures 9/20, 8/16, 8/8 suggest same). Given lack of toxicity will await culture, but would hesitate to treat unless unstable given recurrent cdiff. 6. Normocytic anemia--lower than admission and recent studies, suspect dilutional. Monitor for recurrence of clinically significant hemorrhagic cystitis. Hold ASA, but restart when stabilizes.  7. Hyperkalemia--resolved, likely secondary to supplementation. K-phos stopped. 8. Severe deconditioning--PT 9. FTT, severe malnutrition, weight loss--PT, continue TF. 10. S/p AVR 09/2010 11. History of atrial fibrillation--continue amiodarone, ASA 12. History of hemorrhagic cystitis 13. Sacral decubitis ulcer--present on admission. No evidence of infection. 14. Disposition-- Per my discussion with the hospice SW, it appears patient has been denied a spot at [facility]. Patient is very weak and deconditioned and his wife has expressed concerns about taking care of him at home. It has been recommended that he enter a SNF. After my discussion with the patient today he refuses. There have been some concerns with his mentation and capacity. Given the severity of his physical deconditioning and the wife's inability to care for at home, think we need a psych consult to determine decision-making capacity. Consult has been placed.   Code Status:  DNR   Peggye Pitt, MD [facility] Pager: [phone]  f 8PM-8AM, please contact night-coverage at [email or web address], password [name] 10/28/2011, 4:01 PM  LOS: 3 days   Brief narrative: 78 year old man hospitalized 5 times in the last 7 months with complex PMH presented to ED today with wife with multiple complaints: generalized weakness, diarrhea, FTT, anorexia. Workup in ED notable for hyperkalemia, suspected metabolic acidosis, dehydration, anemia and suspected cdiff and referred for admission.  Consultants:  Palliative Mediciene  Procedures:    Antibiotics:  Oral vancomycin 10/7 >>  HPI/Subjective: "I feel better." Denies nausea, vomiting, abdominal pain. No complaints. No pain.  Objective: Filed Vitals:   10/26/11 1120 10/26/11 1631 10/27/11 0543 10/28/11 0529  BP: 103/62 133/69 122/70 149/79  Pulse: 79 86 76 84  Temp: 97.4 F (36.3 C) 98.2 F (36.8 C) 98.7 F (37.1 C) 98.8 F (37.1 C)  TempSrc: Oral Oral Oral Oral  Resp: 18 20 18 18   Height:      Weight:      SpO2: 99% 97% 98% 99%    Intake/Output Summary (Last 24 hours) at 10/28/11 1601 Last data filed at 10/28/11 1448  Gross per 24 hour  Intake    400 ml  Output   1200 ml  Net   -800 ml   Filed Weights   10/25/11 2028  Weight: 59.7 kg (131 lb 9.8 oz)    Exam:  General:  Appears calm and comfortable Cardiovascular: RRR, no m/r/g. No LE edema. Telemetry: SR, NSST 3 beats Respiratory: CTA bilaterally, no w/r/r. Normal respiratory effort. Abdomen: soft, ntnd, PEG appears in place Skin: no change, sacral decubitus not examined today Psychiatric: grossly normal mood and affect, speech fluent and appropriate  Data Reviewed: Basic  Metabolic Panel:  Lab 10/26/11 9604 10/25/11 2123 10/25/11 1620  NA 140 139 135  K 3.1* 4.0 6.0*  CL 110 107 103  CO2 22 22 18*  GLUCOSE 106* 67* 68*  BUN 18 19 19   CREATININE 0.81 0.90 0.88  CALCIUM 7.1* 7.4* 5.4*  MG -- -- --  PHOS -- -- --   Liver  Function Tests:  Lab 10/25/11 1620  AST 52*  ALT 28  ALKPHOS 162*  BILITOT 0.6  PROT 5.1*  ALBUMIN 1.5*   CBC:  Lab 10/26/11 0500 10/25/11 1620  WBC 7.1 10.6*  NEUTROABS -- 9.1*  HGB 7.8* 9.7*  HCT 23.0* 29.0*  MCV 85.5 84.8  PLT 372 274   CBG:  Lab 10/26/11 1639 10/26/11 1145 10/26/11 0803 10/26/11 0313 10/25/11 2245  GLUCAP 100* 106* 80 97 58*    Recent Results (from the past 240 hour(s))  URINE CULTURE     Status: Normal   Collection Time   10/25/11  4:20 PM      Component Value Range Status Comment   Specimen Description URINE, CLEAN CATCH   Final    Special Requests NONE   Final    Culture  Setup Time 10/26/2011 01:23   Final    Colony Count >=100,000 COLONIES/ML   Final    Culture     Final    Value: Multiple bacterial morphotypes present, none predominant. Suggest appropriate recollection if clinically indicated.   Report Status 10/28/2011 FINAL   Final   CLOSTRIDIUM DIFFICILE BY PCR     Status: Abnormal   Collection Time   10/25/11  6:10 PM      Component Value Range Status Comment   C difficile by pcr POSITIVE (*) NEGATIVE Final      Studies: No results found.  Scheduled Meds:    . amiodarone  200 mg Per Tube BID  . citalopram  20 mg Per Tube Daily  . fluconazole  100 mg Per Tube Daily  . levothyroxine  25 mcg Per Tube QAC breakfast  . mirtazapine  30 mg Per Tube Daily  . neomycin-bacitracin-polymyxin      . sodium chloride  3 mL Intravenous Q12H  . vancomycin  125 mg Per Tube QID   Followed by  . vancomycin  125 mg Per Tube BID   Followed by  . vancomycin  125 mg Per Tube Daily   Followed by  . vancomycin  125 mg Oral QODAY   Followed by  . vancomycin  125 mg Per Tube Q3 days   Continuous Infusions:    . sodium chloride 50 mL/hr at 10/28/11 1448    Principal Problem:  *Hyperkalemia Active Problems:  Atrial fibrillation  Severe malnutrition  Dehydration  C. difficile colitis  Failure to thrive    Peggye Pitt, MD [facility] Pager: [phone]  f 8PM-8AM, please contact night-coverage at [email or web address], password [facility] 10/28/2011, 4:01 PM  LOS: 3 days   Time spent: 25 minutes

## 2011-10-28 NOTE — Progress Notes (Signed)
Had conversation w/ pt wife as she left for evening.  She stated "it looks like I'll be taking care of him".  States that her husband will get a hospital bed at home which shd help.  She appeared overwhelmed with prospect and expressed concern about being able to care for him.  She asked is hospice at home might be an option.  I indicated I wd pass on her inquiry.

## 2011-10-28 NOTE — Progress Notes (Signed)
CSW met with pt at bedside to discuss pt dc plans. Per discussion with csw colleague, and pt MD, pt was declined at [facility]. Per discuss with pt MD, pt may be more appropriate for skilled nursing facility.    At this time, pt is refusing skilled nursing facility as pt does not like the exercise or the staff. Pt shared that he had gone to [facility]. Pt stated, "I swore to myself that I would never go back to a skilled nursing facility." Pt stated that if he goes someone would have to force him there.   CSW asked patient how things were going at home. Pt shared that pt spouse has gotten worn out by all the assistance pt needs. CSW and pt discussed the benefits of therapy at a skilled nursing facility to increase pt strength in order to help pt wife at home with his care. Pt stated, that he was able to walk at this time and doesn't need therapy.   CSW spoke with pt nurse tech that entered pt room during conversation, and pt nurse tech stated that pt is a 2 person max assist to the bed side commode. Pt does not remember having two people assist with his ambulation to toilet or transfer to the bed.   Pt gave csw permission to discuss with pt wife. Pt stated, "I can't believe my wife is plotting all this and wanting to make it her way." CSW discussed with patient, that the csw has not spoken with pt wife, but that the at this time, he is being recommended for skilled nursing placement due to pt weakness. Pt stated, "Ok you can talk to her".   CSW left message for pt spouse.   [name], LCSWA  [phone] .10/28/2011 1431pm

## 2011-10-29 ENCOUNTER — Telehealth: Payer: Self-pay | Admitting: Internal Medicine

## 2011-10-29 DIAGNOSIS — I4891 Unspecified atrial fibrillation: Secondary | ICD-10-CM

## 2011-10-29 MED ORDER — OSMOLITE 1.5 CAL PO LIQD
237.0000 mL | Freq: Every day | ORAL | Status: DC
  Administered 2011-10-29 – 2011-11-01 (×14): 237 mL
  Filled 2011-10-29 (×19): qty 237

## 2011-10-29 NOTE — Progress Notes (Signed)
Psych MD to assess Pt for capacity today.  No psych CSW needs identified, at this time.  Psych MD to contact psych CSW if psych CSW's assistance is needed.  Unit CSW is following Pt for placement.  Psych CSW to sign off.  Providence Crosby, LCSWA Clinical Social Work [phone]

## 2011-10-29 NOTE — Progress Notes (Signed)
Nutrition Note  Consult for TF.  Chart reviewed [facility] by palliative care today.  Family wishes to restart the tube feeding.  Full RD assessment 10/8.  Goal for TF last admit was 6 cans of Osmolite 1.5 per day.  Wife was only managing to get 3-4 cans in daily prior to admit due to pt refusal.  Dysphagia 3 thin diet with no po intake.  Spoke with MD.  Will order TF.  TF goal:  Osmolite 1.5-6 cans daily to provide:  2130 calories, 89 gm protein.  Please page the after hours RD if questions over the weekend.  Oran Rein, RD, LDN Clinical Inpatient Dietitian Pager:  [phone] Weekend and after hours pager:  [phone]

## 2011-10-29 NOTE — Telephone Encounter (Signed)
I dont know how to help in this regard  I will forward to Dr Marlowe Shores

## 2011-10-29 NOTE — Telephone Encounter (Signed)
The patient's wife called the triage line and is hoping to get a message to Dr.John that at the hospital the staff is feeding the pt by mouth.  She states she is under the impression that tube feeding would be a better route.  Her callback number is [phone].

## 2011-10-29 NOTE — Progress Notes (Addendum)
Subjective: 78 y/o male with multiple medical problems, including h/o hemorrhagic cystitis,  generalized weakness, diarrhea, failure to thrive, c diff colitis,s/p AVR 09/2010 who has had multiple admissions over past six months. Patient is confused and does not have decision making capacity. Goals of care meeting done with family including wife and two sisters. Discussed the tube feedings, disposition options.  Objective: Vital signs in last 24 hours: Temp:  [97.6 F (36.4 C)] 97.6 F (36.4 C) (10/11 0629) Pulse Rate:  [83] 83  (10/11 0629) Resp:  [18] 18  (10/11 0629) BP: (148)/(82) 148/82 mmHg (10/11 0629) SpO2:  [99 %] 99 % (10/11 0629) Weight change:  Last BM Date: 10/26/11  Intake/Output from previous day: 10/10 0701 - 10/11 0700 In: 425 [P.O.:25; I.V.:400] Out: 2500 [Urine:2500]     Physical Exam: Head: Normocephalic, atraumatic.  Eyes: No signs of jaundice,  Nose: Mucous membranes dry.  Lungs: Normal respiratory effort. B/L Clear to auscultation, no crackles or wheezes.  Heart: Regular RR. S1 and S2 normal  Abdomen: BS normoactive. Soft, Nondistended, non-tender.  Extremities: No pretibial edema, no erythema Neuro : Alert, oriented x 2, does not have decision making capacity, lacks judgement at this time   [facility] 10/26/11 1639  GLUCAP 100*   Misc. Labs:  Recent Results (from the past 240 hour(s))  URINE CULTURE     Status: Normal   Collection Time   10/25/11  4:20 PM      Component Value Range Status Comment   Specimen Description URINE, CLEAN CATCH   Final    Special Requests NONE   Final    Culture  Setup Time 10/26/2011 01:23   Final    Colony Count >=100,000 COLONIES/ML   Final    Culture     Final    Value: Multiple bacterial morphotypes present, none predominant. Suggest appropriate recollection if clinically indicated.   Report Status 10/28/2011 FINAL   Final   CLOSTRIDIUM DIFFICILE BY PCR     Status: Abnormal   Collection Time   10/25/11  6:10 PM    Component Value Range Status Comment   C difficile by pcr POSITIVE (*) NEGATIVE Final     Studies/Results: No results found.  Medications: Scheduled Meds:   . amiodarone  200 mg Per Tube BID  . citalopram  20 mg Per Tube Daily  . fluconazole  100 mg Per Tube Daily  . levothyroxine  25 mcg Per Tube QAC breakfast  . mirtazapine  30 mg Per Tube Daily  . sodium chloride  3 mL Intravenous Q12H  . vancomycin  125 mg Per Tube QID   Followed by  . vancomycin  125 mg Per Tube BID   Followed by  . vancomycin  125 mg Per Tube Daily   Followed by  . vancomycin  125 mg Oral QODAY   Followed by  . vancomycin  125 mg Per Tube Q3 days   Continuous Infusions:   . sodium chloride 50 mL/hr at 10/28/11 1448   PRN Meds:.acetaminophen (TYLENOL) oral liquid 160 mg/5 mL, alum & mag hydroxide-simeth, morphine injection, ondansetron (ZOFRAN) IV  Assessment/Plan:  Principal Problem:  *Hyperkalemia Active Problems:  Atrial fibrillation  Severe malnutrition  Dehydration  C. difficile colitis  Failure to thrive  Discussed in detail with family, including the wife and two sisters, at this time they would like to pursue with aggressive care. They want to restart the tube feedings, and they agree that he cannot go home as he is requiring skilled  care. They are hopeful that with physical therapy he can get better. They agree that he would need SNF at this time.  Psych consult is pending, If psych determines that he has decision making capacity, then re goals with palliative care will be done.  Recommendations: 1. Restart peg tube feedings 2. Dispo: SNF 3. Await Psych recommendations, will need to re goal if he is deemed to have decision making capacity.   LOS: 4 days   [facility] Pager: [phone] 10/29/2011, 4:18 PM

## 2011-10-29 NOTE — Progress Notes (Signed)
Clinical Social Worker met with pt at bedside, pt spouse not present at this time. Pt appears disoriented, awaiting capacity evaluation. Pt continues to adamantly refuse SNF. Clinical Social Worker contacted pt wife via telephone and left voice message. Per MD, pt wife contacted MD yesterday and stated that she planned to take pt home with hospice. Clinical Child psychotherapist and other staff concerned about wife's ability to care for pt at home due to re-admit and pt decline. Clinical Social Worker to continue to follow to assist with discharge planning needs and provide support to pt and pt wife.  Jacklynn Lewis, MSW, LCSWA  Clinical Social Work [phone]

## 2011-10-29 NOTE — Progress Notes (Signed)
Clinical Social Worker received notification from PMT MD, Dr. Sharl Ma that re-goals of care had been completed and pt family would like to pursue with aggressive treatment and SNF placement. Clinical Social Worker met with pt wife and pt sisters outside of pt room. Pt wife agreeable to SNF search in [place]. Pt wife interested in [employer], [place], and [facility]. At this time, pt remains confused and awaiting psych recommendations on if pt has decision making capacity. Clinical Social Worker completed [employer] and initiated SNF search to [facility]. Clinical Social Worker to follow up with pt wife in regard to SNF bed offers. Clinical Social Worker to continue to follow.  Jacklynn Lewis, MSW, LCSWA  Clinical Social Work [phone]

## 2011-10-29 NOTE — Progress Notes (Addendum)
Clinical Social Worker met with pt wife at length. Clinical Social Worker offered emotional support to pt wife. Discussed pt wife understanding of treatment plan, prognosis, etc. Pt wife appears to be unclear about plans and stated "I want him to be fed", but recognized that pt was pocketing food and spitting food into napkin. Clarified with pt wife the philosophy of residential hospice placement and addressed concerns of pt wife caring for pt at home. Clinical Child psychotherapist discussed with pt wife about pt quality of life and pt wife acknowledges decline in pt since his stroke. Pt wife seems to be uncomfortable with making end of life decisions alone and will ask pt sister to participate in re-goals of care meeting today at 3:30pm. MD entered room and was updated on pt wife feelings and concerns. Plan is for re-goals of care meeting, PT evaluation, and capacity evaluation to determine appropriate disposition. Clinical Social Worker to continue to follow.  Jacklynn Lewis, MSW, LCSWA  Clinical Social Work [phone]

## 2011-10-29 NOTE — Consult Note (Signed)
Patient Identification:  [name] Date of Evaluation:  10/29/2011 Reason for Consult: Capacity  Referring Provider: Dr. Isidoro Donning History of Present Illness:Pt is admitted from home with complex medical history complicated by progressive weakness, persistent diarrhea, anorexia, FTT and malnutrition s/p PEG.  He has Esophageal Candidiasis and C. Difficile.  Past Psychiatric History:  Remote hx of smoking, no alcohol or drug use.   Past Medical History:     Past Medical History  Diagnosis Date  . ADENOCARCINOMA, PROSTATE   . ALLERGIC RHINITIS   . B12 DEFICIENCY   . BENIGN PROSTATIC HYPERTROPHY   . CAROTID ARTERY DISEASE   . CORONARY ARTERY DISEASE   . DIVERTICULOSIS, COLON   . GERD   . HEMORRHOIDS   . HIATAL HERNIA   . HYPERLIPIDEMIA   . HYPERTENSION   . OSTEOPENIA   . PEPTIC ULCER DISEASE   . PERIPHERAL VASCULAR DISEASE   . Personal history of alcoholism   . TRANSIENT ISCHEMIC ATTACK   . Shingles   . Anemia   . AS (aortic stenosis)   . S/P AVR (aortic valve replacement) 09/23/2010  . Complication of anesthesia 09/23/2010    very confused after waking up. Stopped drinking 40 days before this surgery.  . Transfusion reaction, chill fever type 04/28/2011    Fever, rigors, tachycardia,tachypnea but no evidence of hemolysis  . Hemorrhagic cystitis 04/28/2011    Due to previous radiation for PSA recurrence of prostate cancer  . Blood transfusion   . Gross hematuria   . CVA (cerebrovascular accident) 08/29/2011    08/28/11  . C. difficile colitis        Past Surgical History  Procedure Date  . Popliteal synovial cyst excision 1998  . Carotid endarterectomy 1999    right, Left carotid total chronic occlusion  . Prostate surgery 2001  . Coronary angioplasty with stent placement 2004  . Aortic valve replacement 09/23/2010    #50mm [facility] Ease pericardial tissue valve  . Coronary artery bypass graft 09/23/2010    CABG x1 using LIMA to LAD  . Cystoscopy with biopsy  02/15/2011    Procedure: CYSTOSCOPY WITH BIOPSY;  Surgeon: Anner Crete, MD;  Location: [facility];  Service: Urology;  Laterality: N/A;  Cystoscopy, biopsy and fulguration of the bladder  . Esophagogastroduodenoscopy 10/15/2011    Procedure: ESOPHAGOGASTRODUODENOSCOPY (EGD);  Surgeon: Meryl Dare, MD,FACG;  Location: [facility];  Service: Endoscopy;  Laterality: N/A;  . Peg placement 10/15/2011    Procedure: PERCUTANEOUS ENDOSCOPIC GASTROSTOMY (PEG) PLACEMENT;  Surgeon: Meryl Dare, MD,FACG;  Location: [facility];  Service: Endoscopy;  Laterality: N/A;    Allergies:  Allergies  Allergen Reactions  . Tolterodine Tartrate     Reaction=unknown    Current Medications:  Prior to Admission medications   Medication Sig Start Date End Date Taking? Authorizing Provider  amiodarone (PACERONE) 200 MG tablet 200 mg by Feeding Tube route 2 (two) times daily.  05/28/11 05/27/12 Yes Rollene Rotunda, MD  aspirin 81 MG tablet 81 mg by Feeding Tube route daily.    Yes Historical Provider, MD  citalopram (CELEXA) 20 MG tablet 20 mg by Feeding Tube route daily. 09/17/11 09/16/12 Yes Corwin Levins, MD  diphenoxylate-atropine (LOMOTIL) 2.5-0.025 MG per tablet 1 tablet by Feeding Tube route 4 (four) times daily as needed. diarrhea   Yes Historical Provider, MD  ferrous sulfate 325 (65 FE) MG tablet Place 1 tablet (325 mg total) into feeding tube daily with breakfast. 10/18/11 10/17/12 Yes Clanford L Laural Benes,  MD  fluconazole (DIFLUCAN) 100 MG tablet Place 1 tablet (100 mg total) into feeding tube daily. 10/18/11  Yes Clanford Cyndie Mull, MD  levothyroxine (SYNTHROID, LEVOTHROID) 25 MCG tablet 25 mcg by Feeding Tube route daily before breakfast.  08/24/11 08/23/12 Yes Corwin Levins, MD  mirtazapine (REMERON) 30 MG tablet 30 mg by Feeding Tube route daily. 10/08/11  Yes Corwin Levins, MD  Multiple Vitamin (MULITIVITAMIN WITH MINERALS) TABS 1 tablet by Feeding Tube route daily.    Yes Historical Provider, MD  Nutritional  Supplements (FEEDING SUPPLEMENT, OSMOLITE 1.5 CAL,) LIQD Place 237 mLs into feeding tube 4 (four) times daily. Give 60 ml water flushes before and after each can of feed and give an additional 120 mL water flushes 3 times per day 10/18/11  Yes Clanford Cyndie Mull, MD  omeprazole (PRILOSEC) 20 MG capsule 20 mg by Feeding Tube route 2 (two) times daily.    Yes Historical Provider, MD  phosphorus (K-PHOS-NEUTRAL) [phone] MG tablet Place 1 tablet into feeding tube 4 (four) times daily. 10/18/11  Yes Clanford Cyndie Mull, MD  saccharomyces boulardii (FLORASTOR) 250 MG capsule 250 mg by Feeding Tube route 2 (two) times daily. 10/18/11  Yes Clanford Cyndie Mull, MD  thiamine 100 MG tablet 100 mg by Feeding Tube route daily.  07/21/11 07/20/12 Yes Corwin Levins, MD    Social History:    reports that he quit smoking about 53 years ago. He has never used smokeless tobacco. He reports that he does not drink alcohol or use illicit drugs.   Family History:    Family History  Problem Relation Age of Onset  . Colon cancer Mother 35  . Diabetes Sister   . Hypertension Sister   . Coronary artery disease Sister   . Multiple sclerosis Sister   . Stroke Brother     Mental Status Examination/Evaluation: Objective:  Appearance: Cachectic  Psychomotor Activity:  Normal  Eye Contact::  Good  Speech:  Clear and Coherent  Volume:  Decreased  Mood:  Anxious  Affect:  Preoccupied with the referring concerns to his wife.  Thought Process:  Coherent, Irrelevant and Unable to appreciate his physical and medical circumstance  Orientation:  Other:  Patient is oriented to date-year and month only.  Thought Content:  Paranoia fears  placement in SNF considers last visit abusive.   Suicidal Thoughts:  No  Homicidal Thoughts:  No  Judgement:  Other:  Either in denial are totally unable to process complexity and seriousness of his illness  Insight:  Lacking    DIAGNOSIS:   AXIS I  depression. Due to complex medical  illnesses   AXIS II  Deffered  AXIS III See medical notes.  AXIS IV economic problems, other psychosocial or environmental problems, problems related to social environment and problems with primary support group  AXIS V 41-50 serious symptoms   Assessment/Plan: Discussed with Dr. Lily Peer Patient is awake, lying in bed. He has good eye contact. He appears to be unaware of his illnesses or is in denial, unwilling to discuss them. He has only been a and the year but does not know the day of week. He has poor memory recall and unable to complete the serial 7 calculation. He has some ability to express abstract interpretation of proverbs. He is unable to name his medical conditions and perseverates on how badly he was treated in his last visit to a SNF.  He continues to say he doesn't need to be in any SNF  Instead, he keeps saying that his wife needs to go. In reality she is extremely tired and has a \\ and problem. She has more difficulty trying to help him mobilize. He has not been able to ambulate steadily on his own. She arrives before this evaluation is completed and emphasizes the impairment of her health and trying to help him. She states she's no longer physically able to lift and steady ham. RECOMMENDATION:  1. Medications reviewed and agree with indication of the antidepressant and psychotropic medications. 2. Patient lacks capacity and understanding of the severity of his medical illnesses and is not able to make decisions in his best interest. 3. Suggest patient transferred to SNF when medically stable. 4. No further psychiatric needs identified and less requested. M.D. Psychiatrist signs off. Daquarius Dubeau J. Ferol Luz, MD Psychiatrist  10/29/2011 10:26 PM

## 2011-10-29 NOTE — Progress Notes (Signed)
Palliative Medicine Team re-goal requested by Dr Ardyth Harps- spoke with patient's wife at bedside who is requesting her sister-in-law also be present for this meeting - confirmed meeting time for today Friday 10/29/11 @ 3:30 pm  Valente David, RN 10/29/2011, 12:40 PM Palliative Medicine Team RN Liaison [phone]

## 2011-10-29 NOTE — Progress Notes (Addendum)
Clinical Social Work Department CLINICAL SOCIAL WORK PLACEMENT NOTE 10/29/2011  Patient:  [name], [name]  Account Number:  [id number] Admit date:  10/25/2011  Clinical Social Worker:  Jacelyn Grip  Date/time:  10/29/2011 05:15 PM  Clinical Social Work is seeking post-discharge placement for this patient at the following level of care:   SKILLED NURSING   (*CSW will update this form in Epic as items are completed)   10/29/2011  Patient/family provided with [facility] Department of Clinical Social Work's list of facilities offering this level of care within the geographic area requested by the patient (or if unable, by the patient's family).  10/29/2011  Patient/family informed of their freedom to choose among providers that offer the needed level of care, that participate in Medicare, Medicaid or managed care program needed by the patient, have an available bed and are willing to accept the patient.  10/29/2011  Patient/family informed of [facility]' ownership interest in [facility], as well as of the fact that they are under no obligation to receive care at this facility.  PASARR submitted to EDS on 10/29/2011 PASARR number received from EDS on 10/29/2011- existing Pasarr number  FL2 transmitted to all facilities in geographic area requested by pt/family on  10/29/2011 FL2 transmitted to all facilities within larger geographic area on   Patient informed that his/her managed care company has contracts with or will negotiate with  certain facilities, including the following:     Patient/family informed of bed offers received:  10/31/2011 Patient chooses bed at [facility] Physician recommends and patient chooses bed at    Patient to be transferred to  on  [facility] on 11/01/2011 Patient to be transferred to facility by ambulance [facility])  The following physician request were entered in Epic:   Additional Comments:

## 2011-10-29 NOTE — Telephone Encounter (Signed)
Spoke with patient's wife. She just wanted to talk and have someone listen to her about husband and his current hospital admission. She voiced concern about making a decision to go with hospice and/or nursing home placement. Listened to patient's wife. She thanked me for listening to her. She states she has a meeting with the social worker today at 3:30 PM.

## 2011-10-29 NOTE — Telephone Encounter (Signed)
Thank You for doing that. She is a very nice lady.

## 2011-10-29 NOTE — Progress Notes (Signed)
[facility] PROGRESS NOTE  [name] WRU:[id number] DOB: Apr 26, 1926 DOA: 10/25/2011 PCP: Oliver Barre, MD GI: [name], MD--notified of admission 10/8  Assessment/Plan: 1. C diff colitis--continue oral vancomycin, would favor taper as outpatient. 2 large stools documented. 2. Dehydration--improved, continue IVF. 3. Suspected metabolic acidosis--appears resolved, likely secondary to diarrhea.                              4. Hypoglycemia--suspect secondary to poor intake (though tolerating tube feeds at home). Last episode 10/7 PM. Monitor CBGs.  5. Pyuria--chronic indwelling foley, suspect colonization (cultures 9/20, 8/16, 8/8 suggest same). Given lack of toxicity will await culture, but would hesitate to treat unless unstable given recurrent cdiff. 6. Normocytic anemia--lower than admission and recent studies, suspect dilutional. Monitor for recurrence of clinically significant hemorrhagic cystitis. Hold ASA, but restart when stabilizes.  7. Hyperkalemia--resolved, likely secondary to supplementation. K-phos stopped. 8. Severe deconditioning--PT 9. FTT, severe malnutrition, weight loss--PT, continue TF. 10. S/p AVR 09/2010 11. History of atrial fibrillation--continue amiodarone, ASA 12. History of hemorrhagic cystitis 13. Sacral decubitis ulcer--present on admission. No evidence of infection. 14. Disposition-- Palliative care met again today with patient, wife and patient's sister. They have decided to pursue aggressive care at this point. Will restart tube feedings (will ask for nutrition consult for recommendations), will start SNF search. Patient continues to refuse SNF admission, but I do not believe he has decision-making capacity. He does not appear rational. I have discussed with Dr. Sharl Ma and Dr. Ferol Luz and they agree. Await SNF bed.  Code Status: DNR   Peggye Pitt, MD [facility] Pager: [phone]  f 8PM-8AM, please contact night-coverage at [email or web address],  password [facility] 10/29/2011, 6:32 PM  LOS: 4 days   Brief narrative: 78 year old man hospitalized 5 times in the last 7 months with complex PMH presented to ED today with wife with multiple complaints: generalized weakness, diarrhea, FTT, anorexia. Workup in ED notable for hyperkalemia, suspected metabolic acidosis, dehydration, anemia and suspected cdiff and referred for admission.  Consultants:  Palliative Mediciene  Procedures:    Antibiotics:  Oral vancomycin 10/7 >>  HPI/Subjective: "I feel better." Denies nausea, vomiting, abdominal pain. No complaints. No pain.  Objective: Filed Vitals:   10/26/11 1631 10/27/11 0543 10/28/11 0529 10/29/11 0629  BP: 133/69 122/70 149/79 148/82  Pulse: 86 76 84 83  Temp: 98.2 F (36.8 C) 98.7 F (37.1 C) 98.8 F (37.1 C) 97.6 F (36.4 C)  TempSrc: Oral Oral Oral Oral  Resp: 20 18 18 18   Height:      Weight:      SpO2: 97% 98% 99% 99%    Intake/Output Summary (Last 24 hours) at 10/29/11 1832 Last data filed at 10/29/11 0630  Gross per 24 hour  Intake     25 ml  Output   1700 ml  Net  -1675 ml   Filed Weights   10/25/11 2028  Weight: 59.7 kg (131 lb 9.8 oz)    Exam:  General:  Appears calm and comfortable Cardiovascular: RRR, no m/r/g. No LE edema. Telemetry: SR, NSST 3 beats Respiratory: CTA bilaterally, no w/r/r. Normal respiratory effort. Abdomen: soft, ntnd, PEG appears in place Skin: no change, sacral decubitus not examined today Psychiatric: grossly normal mood and affect, speech fluent and appropriate  Data Reviewed: Basic Metabolic Panel:  Lab 10/26/11 5621 10/25/11 2123 10/25/11 1620  NA 140 139 135  K 3.1* 4.0 6.0*  CL 110 107 103  CO2 22 22 18*  GLUCOSE 106* 67* 68*  BUN 18 19 19   CREATININE 0.81 0.90 0.88  CALCIUM 7.1* 7.4* 5.4*  MG -- -- --  PHOS -- -- --   Liver Function Tests:  Lab 10/25/11 1620  AST 52*  ALT 28  ALKPHOS 162*  BILITOT 0.6  PROT 5.1*  ALBUMIN 1.5*   CBC:  Lab  10/26/11 0500 10/25/11 1620  WBC 7.1 10.6*  NEUTROABS -- 9.1*  HGB 7.8* 9.7*  HCT 23.0* 29.0*  MCV 85.5 84.8  PLT 372 274   CBG:  Lab 10/26/11 1639 10/26/11 1145 10/26/11 0803 10/26/11 0313 10/25/11 2245  GLUCAP 100* 106* 80 97 58*    Recent Results (from the past 240 hour(s))  URINE CULTURE     Status: Normal   Collection Time   10/25/11  4:20 PM      Component Value Range Status Comment   Specimen Description URINE, CLEAN CATCH   Final    Special Requests NONE   Final    Culture  Setup Time 10/26/2011 01:23   Final    Colony Count >=100,000 COLONIES/ML   Final    Culture     Final    Value: Multiple bacterial morphotypes present, none predominant. Suggest appropriate recollection if clinically indicated.   Report Status 10/28/2011 FINAL   Final   CLOSTRIDIUM DIFFICILE BY PCR     Status: Abnormal   Collection Time   10/25/11  6:10 PM      Component Value Range Status Comment   C difficile by pcr POSITIVE (*) NEGATIVE Final      Studies: No results found.  Scheduled Meds:    . amiodarone  200 mg Per Tube BID  . citalopram  20 mg Per Tube Daily  . fluconazole  100 mg Per Tube Daily  . levothyroxine  25 mcg Per Tube QAC breakfast  . mirtazapine  30 mg Per Tube Daily  . sodium chloride  3 mL Intravenous Q12H  . vancomycin  125 mg Per Tube QID   Followed by  . vancomycin  125 mg Per Tube BID   Followed by  . vancomycin  125 mg Per Tube Daily   Followed by  . vancomycin  125 mg Oral QODAY   Followed by  . vancomycin  125 mg Per Tube Q3 days   Continuous Infusions:    . sodium chloride 50 mL/hr at 10/28/11 1448    Principal Problem:  *Hyperkalemia Active Problems:  Atrial fibrillation  Severe malnutrition  Dehydration  C. difficile colitis  Failure to thrive    Peggye Pitt, MD [facility] Pager: [phone]  f 8PM-8AM, please contact night-coverage at [email or web address], password [facility] 10/29/2011, 6:32 PM  LOS: 4 days   Time spent: 25  minutes

## 2011-10-30 NOTE — Evaluation (Signed)
Physical Therapy Evaluation Patient Details Name: SEMIR BRILL MRN: [id number] DOB: [birth year] Today's Date: 10/30/2011 Time: 4098-1191 PT Time Calculation (min): 18 min  PT Assessment / Plan / Recommendation Clinical Impression  Pt admitted for c-diff and dehydration.  Pt would benefit from acute PT services in order to improve safety and independence with transfers and ambulation to prepare for d/c to SNF. Pt with recent admission and would benefit from ST-SNF prior to home to improve mobility, strength, and activity tolerance.      PT Assessment  Patient needs continued PT services    Follow Up Recommendations  Post acute inpatient    Does the patient have the potential to tolerate intense rehabilitation   No, Recommend SNF  Barriers to Discharge        Equipment Recommendations  None recommended by PT    Recommendations for Other Services     Frequency Min 3X/week    Precautions / Restrictions Precautions Precautions: Fall   Pertinent Vitals/Pain No pain      Mobility  Bed Mobility Bed Mobility: Supine to Sit Supine to Sit: 3: Mod assist Details for Bed Mobility Assistance: verbal cues for technique, able to move lower body but needed assist for trunk Transfers Transfers: Sit to Stand;Stand to Sit Sit to Stand: 4: Min assist;With upper extremity assist;From chair/3-in-1;From bed Stand to Sit: To chair/3-in-1;4: Min assist;With upper extremity assist Details for Transfer Assistance: +2 for safety, verbal cues for safe technique, assist to rise and steady and control descent, performed x2 sitting for instruction as pt attempting to sit prior to backing all the way up to chair. Ambulation/Gait Ambulation/Gait Assistance: 4: Min guard Ambulation Distance (Feet): 60 Feet Assistive device: Rolling walker Ambulation/Gait Assistance Details: manual grip for L hand on RW, assist to navigate RW, verbal cues for RW distance and posture,  Gait Pattern: Step-through  pattern;Decreased stride length;Trunk flexed Gait velocity: increased speed near end with fatigue requiring cues to slow down for safety    Shoulder Instructions     Exercises     PT Diagnosis: Difficulty walking;Generalized weakness  PT Problem List: Decreased strength;Decreased activity tolerance;Decreased mobility;Decreased safety awareness;Decreased knowledge of use of DME PT Treatment Interventions: DME instruction;Gait training;Functional mobility training;Therapeutic activities;Therapeutic exercise;Patient/family education;Neuromuscular re-education;Balance training   PT Goals Acute Rehab PT Goals PT Goal Formulation: With patient Time For Goal Achievement: 11/06/11 Potential to Achieve Goals: Fair Pt will go Sit to Stand: with supervision PT Goal: Sit to Stand - Progress: Goal set today Pt will go Stand to Sit: with supervision PT Goal: Stand to Sit - Progress: Goal set today Pt will Ambulate: >150 feet;with supervision;with least restrictive assistive device PT Goal: Ambulate - Progress: Goal set today  Visit Information  Last PT Received On: 10/30/11 Assistance Needed: +2    Subjective Data  Subjective: pt eager to get OOB   Prior Functioning  Home Living Lives With: Spouse Available Help at Discharge: Family;Available PRN/intermittently Type of Home: House Home Access: Stairs to enter [employer] of Steps: 5 Entrance Stairs-Rails: None Home Layout: One level Bathroom Shower/Tub: Engineer, manufacturing systems: Standard Home Adaptive Equipment: Shower chair with back;Walker - rolling;Straight cane;Bedside commode/3-in-1 Prior Function Level of Independence: Needs assistance Needs Assistance: Light Housekeeping;Meal Prep;Dressing;Bathing Bath: Moderate Dressing: Moderate Meal Prep: Total Light Housekeeping: Total Able to Take Stairs?: Yes Driving: Yes Vocation: Retired Comments: Information from previous hospitalization.  Pt vague about PLOF  when questioned.  Per chart, spouse unable to care for him at home and now considering  SNF. Communication Communication: No difficulties Dominant Hand: Right    Cognition  Overall Cognitive Status: History of cognitive impairments - at baseline Arousal/Alertness: Awake/alert Orientation Level: Appears intact for tasks assessed Behavior During Session: [facility] for tasks performed    Extremity/Trunk Assessment Right Upper Extremity Assessment RUE ROM/Strength/Tone: [facility] for tasks assessed Left Upper Extremity Assessment LUE ROM/Strength/Tone: Deficits LUE ROM/Strength/Tone Deficits: decreased hand grip, 3+/5 shoulder, elbow and distally WFL LUE Sensation: Deficits LUE Sensation Deficits: pt reports decreased sensation since previous CVA Right Lower Extremity Assessment RLE ROM/Strength/Tone: Deficits RLE ROM/Strength/Tone Deficits: grossly 2+/5 throughout, unable to perform full knee extension, poor trunk control with testing as well Left Lower Extremity Assessment LLE ROM/Strength/Tone: Deficits LLE ROM/Strength/Tone Deficits: grossly 2+/5 throughout, unable to perform full knee extension, poor trunk control with testing as well   Balance    End of Session PT - End of Session Activity Tolerance: Patient limited by fatigue Patient left: in chair;with call bell/phone within reach Nurse Communication: Mobility status;Other (comment) (pt up in recliner)  GP     Jasiyah Paulding,KATHrine E 10/30/2011, 1:16 PM Pager: [phone]

## 2011-10-30 NOTE — Progress Notes (Signed)
[facility] PROGRESS NOTE  [name] YNW:[id number] DOB: 07/28/1926 DOA: 10/25/2011 PCP: Oliver Barre, MD GI: [name], MD--notified of admission 10/8  Assessment/Plan: 1. C diff colitis--continue oral vancomycin, would favor taper as outpatient. 2 large stools documented. 2. Dehydration--improved, continue IVF. 3. Suspected metabolic acidosis--appears resolved, likely secondary to diarrhea.                              4. Hypoglycemia--suspect secondary to poor intake (though tolerating tube feeds at home). Last episode 10/7 PM. Monitor CBGs.  5. Pyuria--chronic indwelling foley, suspect colonization (cultures 9/20, 8/16, 8/8 suggest same). Given lack of toxicity will await culture, but would hesitate to treat unless unstable given recurrent cdiff. 6. Normocytic anemia--lower than admission and recent studies, suspect dilutional. Monitor for recurrence of clinically significant hemorrhagic cystitis. Hold ASA, but restart when stabilizes.  7. Hyperkalemia--resolved, likely secondary to supplementation. K-phos stopped. 8. Severe deconditioning--PT 9. FTT, severe malnutrition, weight loss--PT, continue TF. 10. S/p AVR 09/2010 11. History of atrial fibrillation--continue amiodarone, ASA 12. History of hemorrhagic cystitis 13. Sacral decubitis ulcer--present on admission. No evidence of infection. 14. Disposition-- After re goals of care yesterday, family has decided to resume tube feeds and pursue SNF for rehab purposes. Patient continues to refuse SNF admission, but I do not believe he has decision-making capacity. He does not appear rational. I have discussed with Dr. Sharl Ma and Dr. Ferol Luz and they agree. Await SNF bed.  Code Status: DNR   Peggye Pitt, MD [facility] Pager: [phone]  f 8PM-8AM, please contact night-coverage at [email or web address], password [facility] 10/30/2011, 12:29 PM  LOS: 5 days   Brief narrative: 78 year old man hospitalized 5 times in the last 7 months  with complex PMH presented to ED today with wife with multiple complaints: generalized weakness, diarrhea, FTT, anorexia. Workup in ED notable for hyperkalemia, suspected metabolic acidosis, dehydration, anemia and suspected cdiff and referred for admission.  Consultants:  Palliative Mediciene  Procedures:    Antibiotics:  Oral vancomycin 10/7 >>  HPI/Subjective: "I feel better." Denies nausea, vomiting, abdominal pain. No complaints. No pain.  Objective: Filed Vitals:   10/28/11 0529 10/29/11 0629 10/29/11 2233 10/30/11 0610  BP: 149/79 148/82 105/65 130/69  Pulse: 84 83 79 75  Temp: 98.8 F (37.1 C) 97.6 F (36.4 C) 98.2 F (36.8 C) 98.1 F (36.7 C)  TempSrc: Oral Oral Oral Oral  Resp: 18 18 17 18   Height:      Weight:      SpO2: 99% 99% 100% 97%    Intake/Output Summary (Last 24 hours) at 10/30/11 1229 Last data filed at 10/30/11 0657  Gross per 24 hour  Intake    860 ml  Output    200 ml  Net    660 ml   Filed Weights   10/25/11 2028  Weight: 59.7 kg (131 lb 9.8 oz)    Exam:  General:  Appears calm and comfortable Cardiovascular: RRR, no m/r/g. No LE edema. Telemetry: SR, NSST 3 beats Respiratory: CTA bilaterally, no w/r/r. Normal respiratory effort. Abdomen: soft, ntnd, PEG appears in place Skin: no change, sacral decubitus not examined today Psychiatric: grossly normal mood and affect, speech fluent and appropriate  Data Reviewed: Basic Metabolic Panel:  Lab 10/26/11 6295 10/25/11 2123 10/25/11 1620  NA 140 139 135  K 3.1* 4.0 6.0*  CL 110 107 103  CO2 22 22 18*  GLUCOSE 106* 67* 68*  BUN 18 19 19  CREATININE 0.81 0.90 0.88  CALCIUM 7.1* 7.4* 5.4*  MG -- -- --  PHOS -- -- --   Liver Function Tests:  Lab 10/25/11 1620  AST 52*  ALT 28  ALKPHOS 162*  BILITOT 0.6  PROT 5.1*  ALBUMIN 1.5*   CBC:  Lab 10/26/11 0500 10/25/11 1620  WBC 7.1 10.6*  NEUTROABS -- 9.1*  HGB 7.8* 9.7*  HCT 23.0* 29.0*  MCV 85.5 84.8  PLT 372 274    CBG:  Lab 10/26/11 1639 10/26/11 1145 10/26/11 0803 10/26/11 0313 10/25/11 2245  GLUCAP 100* 106* 80 97 58*    Recent Results (from the past 240 hour(s))  URINE CULTURE     Status: Normal   Collection Time   10/25/11  4:20 PM      Component Value Range Status Comment   Specimen Description URINE, CLEAN CATCH   Final    Special Requests NONE   Final    Culture  Setup Time 10/26/2011 01:23   Final    Colony Count >=100,000 COLONIES/ML   Final    Culture     Final    Value: Multiple bacterial morphotypes present, none predominant. Suggest appropriate recollection if clinically indicated.   Report Status 10/28/2011 FINAL   Final   CLOSTRIDIUM DIFFICILE BY PCR     Status: Abnormal   Collection Time   10/25/11  6:10 PM      Component Value Range Status Comment   C difficile by pcr POSITIVE (*) NEGATIVE Final      Studies: No results found.  Scheduled Meds:    . amiodarone  200 mg Per Tube BID  . citalopram  20 mg Per Tube Daily  . feeding supplement (OSMOLITE 1.5 CAL)  237 mL Per Tube 6 X Daily  . fluconazole  100 mg Per Tube Daily  . levothyroxine  25 mcg Per Tube QAC breakfast  . mirtazapine  30 mg Per Tube Daily  . sodium chloride  3 mL Intravenous Q12H  . vancomycin  125 mg Per Tube QID   Followed by  . vancomycin  125 mg Per Tube BID   Followed by  . vancomycin  125 mg Per Tube Daily   Followed by  . vancomycin  125 mg Oral QODAY   Followed by  . vancomycin  125 mg Per Tube Q3 days   Continuous Infusions:    . sodium chloride 50 mL/hr at 10/28/11 1448    Principal Problem:  *Hyperkalemia Active Problems:  Atrial fibrillation  Severe malnutrition  Dehydration  C. difficile colitis  Failure to thrive    Peggye Pitt, MD [facility] Pager: [phone]  f 8PM-8AM, please contact night-coverage at [email or web address], password [facility] 10/30/2011, 12:29 PM  LOS: 5 days   Time spent: 25 minutes

## 2011-10-31 DIAGNOSIS — R410 Disorientation, unspecified: Secondary | ICD-10-CM | POA: Diagnosis present

## 2011-10-31 MED ORDER — LORAZEPAM 0.5 MG PO TABS: 0.5000 mg | ORAL_TABLET | ORAL | Status: DC | PRN

## 2011-10-31 MED ORDER — RISPERIDONE 1 MG PO TABS
1.0000 mg | ORAL_TABLET | Freq: Two times a day (BID) | ORAL | Status: DC
  Administered 2011-10-31 – 2011-11-01 (×3): 1 mg
  Filled 2011-10-31 (×4): qty 1
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
## 2011-10-31 NOTE — Progress Notes (Signed)
CSW contacted wife for SNF bed offers. CSW explained the bed offers to date and will leave the offers in the room.    Wife was appreciative for assistance and will look at the list and make a decision as to the facility. Wife still hopeful that [facility] or [facility] will make a bed offer.    Leron Croak, LCSWA [employer] [phone]

## 2011-10-31 NOTE — Progress Notes (Addendum)
Progress Note from the Palliative Medicine Team at [facility]  Subjective Agitated this AM per nursing, given morphine for pain, increasing confusion and combative behavior.   Objective: Allergies  Allergen Reactions  . Tolterodine Tartrate     Reaction=unknown   Scheduled Meds:   . amiodarone  200 mg Per Tube BID  . citalopram  20 mg Per Tube Daily  . feeding supplement (OSMOLITE 1.5 CAL)  237 mL Per Tube 6 X Daily  . fluconazole  100 mg Per Tube Daily  . levothyroxine  25 mcg Per Tube QAC breakfast  . mirtazapine  30 mg Per Tube Daily  . risperiDONE  1 mg Per Tube BID  . sodium chloride  3 mL Intravenous Q12H  . vancomycin  125 mg Per Tube QID   Followed by  . vancomycin  125 mg Per Tube BID   Followed by  . vancomycin  125 mg Per Tube Daily   Followed by  . vancomycin  125 mg Oral QODAY   Followed by  . vancomycin  125 mg Per Tube Q3 days   Continuous Infusions:   . sodium chloride 50 mL/hr at 10/30/11 1912   PRN Meds:.acetaminophen (TYLENOL) oral liquid 160 mg/5 mL, alum & mag hydroxide-simeth, LORazepam, morphine injection, ondansetron (ZOFRAN) IV  BP 123/73  Pulse 78  Temp 98.8 F (37.1 C) (Oral)  Resp 18  Ht 5\' 11"  (1.803 m)  Wt 59.7 kg (131 lb 9.8 oz)  BMI 18.36 kg/m2  SpO2 94%   PPS:20   Intake/Output Summary (Last 24 hours) at 10/31/11 1424 Last data filed at 10/31/11 1015  Gross per 24 hour  Intake     10 ml  Output    800 ml  Net   -790 ml      Physical Exam: Resting comfortably undisturbed, but becomes agitated with minimal provider initiated arousal or movement, he is extremely frail and weak appearing. Confused. Only oriented to person. Peg tube is in place feedings initiated. No new rhonchi or signs of aspiration currently. Abdomen is soft.  Labs: CBC    Component Value Date/Time   WBC 7.1 10/26/2011 0500   RBC 2.69* 10/26/2011 0500   HGB 7.8* 10/26/2011 0500   HCT 23.0* 10/26/2011 0500   PLT 372 10/26/2011 0500   MCV 85.5 10/26/2011 0500    MCH 29.0 10/26/2011 0500   MCHC 33.9 10/26/2011 0500   RDW 17.1* 10/26/2011 0500   LYMPHSABS 0.8 10/25/2011 1620   MONOABS 0.6 10/25/2011 1620   EOSABS 0.0 10/25/2011 1620   BASOSABS 0.0 10/25/2011 1620    BMET    Component Value Date/Time   NA 140 10/26/2011 0500   K 3.1* 10/26/2011 0500   CL 110 10/26/2011 0500   CO2 22 10/26/2011 0500   GLUCOSE 106* 10/26/2011 0500   BUN 18 10/26/2011 0500   CREATININE 0.81 10/26/2011 0500   CREATININE 1.05 01/29/2011 1708   CALCIUM 7.1* 10/26/2011 0500   GFRNONAA 80* 10/26/2011 0500   GFRAA >90 10/26/2011 0500    CMP     Component Value Date/Time   NA 140 10/26/2011 0500   K 3.1* 10/26/2011 0500   CL 110 10/26/2011 0500   CO2 22 10/26/2011 0500   GLUCOSE 106* 10/26/2011 0500   BUN 18 10/26/2011 0500   CREATININE 0.81 10/26/2011 0500   CREATININE 1.05 01/29/2011 1708   CALCIUM 7.1* 10/26/2011 0500   PROT 5.1* 10/25/2011 1620   ALBUMIN 1.5* 10/25/2011 1620   AST 52* 10/25/2011 1620  ALT 28 10/25/2011 1620   ALKPHOS 162* 10/25/2011 1620   BILITOT 0.6 10/25/2011 1620   GFRNONAA 80* 10/26/2011 0500   GFRAA >90 10/26/2011 0500    Assessment and Plan: 78 y/o man with multiple medical problems, including h/o hemorrhagic cystitis, generalized weakness, failure to thrive, c diff colitis,s/p AVR 09/2010 who has had multiple admissions over past six months. He is confused with active delirium and does not have decision making capacity. He has been evaluated by psychiatry who corroborate capacity is not present. Family deciding on disposition options and plan for SNF for rehab attempt, would recommend palliative care follow him at facility if this is possible, he at high risk for readmission given his multiple serious medical problems.  For his delirium I have started him on scheduled BID Risperdal and ordered prn ativan for severe agitation. Will continue to follow while in the hospital, he remains a DNR with full scope medical interventions.  Family has elected to  continue tube feeding in hopes of helping to improve his nutritional status, he is high risk for aspiration even with tube feeding and will need to be monitored closely - tube feedings have also been shown to increase delirium and lead to the need for physical restraints and pharmacologic sedation - may correlate with his worsening delirium.   Time: 25 minutes Greater than 50%  of this time was spent counseling and coordinating care related to the above assessment and plan.     1

## 2011-10-31 NOTE — Progress Notes (Signed)
[facility] PROGRESS NOTE  [name] YNW:[id number] DOB: [birth year] DOA: 10/25/2011 PCP: Oliver Barre, MD GI: [name], MD--notified of admission 10/8  Assessment/Plan: 1. C diff colitis--continue oral vancomycin, would favor taper as outpatient. 2 large stools documented. 2. Dehydration--improved, continue IVF. 3. Suspected metabolic acidosis--appears resolved, likely secondary to diarrhea.                              4. Hypoglycemia--suspect secondary to poor intake (though tolerating tube feeds at home). Last episode 10/7 PM. Monitor CBGs.  5. Pyuria--chronic indwelling foley, suspect colonization (cultures 9/20, 8/16, 8/8 suggest same). Given lack of toxicity will await culture, but would hesitate to treat unless unstable given recurrent cdiff. 6. Normocytic anemia--lower than admission and recent studies, suspect dilutional. Monitor for recurrence of clinically significant hemorrhagic cystitis. Hold ASA, but restart when stabilizes.  7. Hyperkalemia--resolved, likely secondary to supplementation. K-phos stopped. 8. Severe deconditioning--PT 9. FTT, severe malnutrition, weight loss--PT, continue TF. 10. S/p AVR 09/2010 11. History of atrial fibrillation--continue amiodarone, ASA 12. History of hemorrhagic cystitis 13. Sacral decubitis ulcer--present on admission. No evidence of infection. 14. Disposition-- After re goals of care yesterday, family has decided to resume tube feeds and pursue SNF for rehab purposes. Patient continues to refuse SNF admission, but I do not believe he has decision-making capacity. He does not appear rational. I have discussed with Dr. Sharl Ma and Dr. Ferol Luz and they agree. Await SNF bed. No changes today.  Code Status: DNR   Peggye Pitt, MD [facility] Pager: [phone]  f 8PM-8AM, please contact night-coverage at [email or web address], password [facility] 10/31/2011, 12:02 PM  LOS: 6 days   Brief narrative: 78 year old man hospitalized 5 times in  the last 7 months with complex PMH presented to ED today with wife with multiple complaints: generalized weakness, diarrhea, FTT, anorexia. Workup in ED notable for hyperkalemia, suspected metabolic acidosis, dehydration, anemia and suspected cdiff and referred for admission.  Consultants:  Palliative Mediciene  Procedures:    Antibiotics:  Oral vancomycin 10/7 >>  HPI/Subjective: "I feel better." Denies nausea, vomiting, abdominal pain. No complaints. No pain.  Objective: Filed Vitals:   10/30/11 0610 10/30/11 1600 10/30/11 2205 10/31/11 0545  BP: 130/69 116/71 127/74 123/73  Pulse: 75 69 70 78  Temp: 98.1 F (36.7 C) 97.9 F (36.6 C) 98.2 F (36.8 C) 98.8 F (37.1 C)  TempSrc: Oral Oral Oral Oral  Resp: 18 18 17 18   Height:      Weight:      SpO2: 97% 97% 98% 94%    Intake/Output Summary (Last 24 hours) at 10/31/11 1202 Last data filed at 10/31/11 0546  Gross per 24 hour  Intake      0 ml  Output    800 ml  Net   -800 ml   Filed Weights   10/25/11 2028  Weight: 59.7 kg (131 lb 9.8 oz)    Exam:  General:  Appears calm and comfortable Cardiovascular: RRR, no m/r/g. No LE edema. Telemetry: SR, NSST 3 beats Respiratory: CTA bilaterally, no w/r/r. Normal respiratory effort. Abdomen: soft, ntnd, PEG appears in place Skin: no change, sacral decubitus not examined today Psychiatric: grossly normal mood and affect, speech fluent and appropriate  Data Reviewed: Basic Metabolic Panel:  Lab 10/26/11 6295 10/25/11 2123 10/25/11 1620  NA 140 139 135  K 3.1* 4.0 6.0*  CL 110 107 103  CO2 22 22 18*  GLUCOSE 106* 67* 68*  BUN 18 19 19   CREATININE 0.81 0.90 0.88  CALCIUM 7.1* 7.4* 5.4*  MG -- -- --  PHOS -- -- --   Liver Function Tests:  Lab 10/25/11 1620  AST 52*  ALT 28  ALKPHOS 162*  BILITOT 0.6  PROT 5.1*  ALBUMIN 1.5*   CBC:  Lab 10/26/11 0500 10/25/11 1620  WBC 7.1 10.6*  NEUTROABS -- 9.1*  HGB 7.8* 9.7*  HCT 23.0* 29.0*  MCV 85.5 84.8    PLT 372 274   CBG:  Lab 10/26/11 1639 10/26/11 1145 10/26/11 0803 10/26/11 0313 10/25/11 2245  GLUCAP 100* 106* 80 97 58*    Recent Results (from the past 240 hour(s))  URINE CULTURE     Status: Normal   Collection Time   10/25/11  4:20 PM      Component Value Range Status Comment   Specimen Description URINE, CLEAN CATCH   Final    Special Requests NONE   Final    Culture  Setup Time 10/26/2011 01:23   Final    Colony Count >=100,000 COLONIES/ML   Final    Culture     Final    Value: Multiple bacterial morphotypes present, none predominant. Suggest appropriate recollection if clinically indicated.   Report Status 10/28/2011 FINAL   Final   CLOSTRIDIUM DIFFICILE BY PCR     Status: Abnormal   Collection Time   10/25/11  6:10 PM      Component Value Range Status Comment   C difficile by pcr POSITIVE (*) NEGATIVE Final      Studies: No results found.  Scheduled Meds:    . amiodarone  200 mg Per Tube BID  . citalopram  20 mg Per Tube Daily  . feeding supplement (OSMOLITE 1.5 CAL)  237 mL Per Tube 6 X Daily  . fluconazole  100 mg Per Tube Daily  . levothyroxine  25 mcg Per Tube QAC breakfast  . mirtazapine  30 mg Per Tube Daily  . sodium chloride  3 mL Intravenous Q12H  . vancomycin  125 mg Per Tube QID   Followed by  . vancomycin  125 mg Per Tube BID   Followed by  . vancomycin  125 mg Per Tube Daily   Followed by  . vancomycin  125 mg Oral QODAY   Followed by  . vancomycin  125 mg Per Tube Q3 days   Continuous Infusions:    . sodium chloride 50 mL/hr at 10/30/11 1912    Principal Problem:  *Hyperkalemia Active Problems:  Atrial fibrillation  Severe malnutrition  Dehydration  C. difficile colitis  Failure to thrive    Peggye Pitt, MD [facility] Pager: [phone]  f 8PM-8AM, please contact night-coverage at [email or web address], password [facility] 10/31/2011, 12:02 PM  LOS: 6 days   Time spent: 25 minutes

## 2011-10-31 NOTE — Plan of Care (Signed)
Problem: Phase II Progression Outcomes Goal: Progress activity as tolerated unless otherwise ordered Outcome: Progressing Ambulating with PT

## 2011-11-01 DIAGNOSIS — R404 Transient alteration of awareness: Secondary | ICD-10-CM

## 2011-11-01 DIAGNOSIS — D62 Acute posthemorrhagic anemia: Secondary | ICD-10-CM

## 2011-11-01 MED ORDER — ACETAMINOPHEN 160 MG/5ML PO SOLN: 650.0000 mg | Freq: Four times a day (QID) | ORAL | Status: DC | PRN

## 2011-11-01 MED ORDER — VANCOMYCIN 50 MG/ML ORAL SOLUTION: 125.0000 mg | ORAL | Status: DC

## 2011-11-01 MED ORDER — VANCOMYCIN 50 MG/ML ORAL SOLUTION: 125.0000 mg | Freq: Every day | ORAL | Status: DC

## 2011-11-01 MED ORDER — RISPERIDONE 1 MG PO TABS: 1.0000 mg | ORAL_TABLET | Freq: Two times a day (BID) | ORAL | Status: DC

## 2011-11-01 MED ORDER — VANCOMYCIN 50 MG/ML ORAL SOLUTION: 125.0000 mg | Freq: Two times a day (BID) | ORAL | Status: DC

## 2011-11-01 MED ORDER — VANCOMYCIN 50 MG/ML ORAL SOLUTION: 125.0000 mg | Freq: Four times a day (QID) | ORAL | Status: DC

## 2011-11-01 MED ORDER — LORAZEPAM 0.5 MG PO TABS: 0.5000 mg | ORAL_TABLET | ORAL | Status: DC | PRN

## 2011-11-01 NOTE — Progress Notes (Signed)
Nutrition Brief Note  Intervention: Recommend Osmolite 1.5 via PEG 6 cans daily with 60ml water flushes before and after each bolus TF can. If IVF d/c, recommend additional water flushes 3 times/day to meet all of pt's estimated fluid needs. Text paged MD with RD recommendations. Recommend SNF RD continue to manage pt's TF plan and adjust accordingly at discharge.    - Noted plans for d/c today. Pt currently getting 6 cans of Osmolite 1.5/day which provides 2130 calories, 89g protein, and free water. Pt's discharge summary is for 4 cans of Osmolite 1.5/day - this provides 1420 calories, 60g protein - meeting 76% of calorie needs, and 67% of protein needs - inadequate to meet goal of TF meeting >90% of estimated nutritional needs as pt not eating orally.   Levon Hedger MS, RD, LDN [phone] Pager [phone] After Hours Pager

## 2011-11-01 NOTE — Discharge Summary (Addendum)
Physician Discharge Summary  Patient ID: [name] MRN: [id number] DOB/AGE: Apr 03, 1926 78 y.o.  Admit date: 10/25/2011 Discharge date: 11/01/2011  Primary Care Physician:  Oliver Barre, MD   Discharge Diagnoses:    Principal Problem:  *Hyperkalemia Active Problems:  Atrial fibrillation  Severe malnutrition  Dehydration  C. difficile colitis  Failure to thrive  Delirium      Medication List     As of 11/01/2011 10:00 AM    STOP taking these medications         phosphorus [id number] MG tablet   Commonly known as: K PHOS NEUTRAL      TAKE these medications         acetaminophen 160 MG/5ML solution   Commonly known as: TYLENOL   Take 20.3 mLs (650 mg total) by mouth every 6 (six) hours as needed.      amiodarone 200 MG tablet   Commonly known as: PACERONE   200 mg by Feeding Tube route 2 (two) times daily.      aspirin 81 MG tablet   81 mg by Feeding Tube route daily.      citalopram 20 MG tablet   Commonly known as: CELEXA   20 mg by Feeding Tube route daily.      diphenoxylate-atropine 2.5-0.025 MG per tablet   Commonly known as: LOMOTIL   1 tablet by Feeding Tube route 4 (four) times daily as needed. diarrhea      feeding supplement (OSMOLITE 1.5 CAL) Liqd   Place 237 mLs into feeding tube 4 (four) times daily. Give 60 ml water flushes before and after each can of feed and give an additional 120 mL water flushes 3 times per day      ferrous sulfate 325 (65 FE) MG tablet   Place 1 tablet (325 mg total) into feeding tube daily with breakfast.      fluconazole 100 MG tablet   Commonly known as: DIFLUCAN   Place 1 tablet (100 mg total) into feeding tube daily.      levothyroxine 25 MCG tablet   Commonly known as: SYNTHROID, LEVOTHROID   25 mcg by Feeding Tube route daily before breakfast.      LORazepam 0.5 MG tablet   Commonly known as: ATIVAN   Take 1 tablet (0.5 mg total) by mouth every 4 (four) hours as needed.      mirtazapine 30 MG tablet     Commonly known as: REMERON   30 mg by Feeding Tube route daily.      multivitamin with minerals Tabs   1 tablet by Feeding Tube route daily.      omeprazole 20 MG capsule   Commonly known as: PRILOSEC   20 mg by Feeding Tube route 2 (two) times daily.      risperiDONE 1 MG tablet   Commonly known as: RISPERDAL   Place 1 tablet (1 mg total) into feeding tube 2 (two) times daily.      saccharomyces boulardii 250 MG capsule   Commonly known as: FLORASTOR   250 mg by Feeding Tube route 2 (two) times daily.      thiamine 100 MG tablet   100 mg by Feeding Tube route daily.      vancomycin 50 mg/mL oral solution   Commonly known as: VANCOCIN   Place 2.5 mLs (125 mg total) into feeding tube 4 (four) times daily for 3 more days, then:      vancomycin 50 mg/mL oral solution   Commonly  known as: VANCOCIN   Place 2.5 mLs (125 mg total) into feeding tube 2 (two) times daily for 7 days, then      vancomycin 50 mg/mL oral solution   Commonly known as: VANCOCIN   Place 2.5 mLs (125 mg total) into feeding tube daily for 7 days, then      vancomycin 50 mg/mL oral solution   Commonly known as: VANCOCIN   Take 2.5 mLs (125 mg total) by mouth every other day for 7 days, then      vancomycin 50 mg/mL oral solution   Commonly known as: VANCOCIN   Place 2.5 mLs (125 mg total) into feeding tube every 3 (three) days for 7 days.         Disposition and Follow-up:  Will be discharged to SNF today. Please have palliative care follow patient while at SNF.  Consults:  Palliative Care, Dr. Phillips Odor.   Significant Diagnostic Studies:  CXR 10/7: IMPRESSION:  Bibasilar atelectasis.  Brief H and P: For complete details please refer to admission H and P, but in brief patient is an 78 year old man hospitalized 5 times in the last 7 months with complex PMH presented to ED today with wife with multiple complaints: generalized weakness, diarrhea, FTT, anorexia. Workup in ED notable for  hyperkalemia, suspected metabolic acidosis, dehydration, anemia and suspected cdiff and referred for admission.  Most recently hospitalized in late September and discharged 10/2. Was treated for severe malnutrition, cdiff colitis and PEG tube was placed. SNF was strongly recommended by PT, MD, CSW but family refused. Since being at home patient has been on a cot downstairs and has not moved from it until today. He has not ambulated since discharge and his attempts at standing have resulted in falls. He remains anorexic and will not eat anything his wife prepares for him. He has continued to have diarrhea (perhaps 2x/day, though history is vague and wife cannot confirm frequency) and never filled vancomycin for unclear reasons. He has been on glucerna TF. Has tolerated TF without pain, nausea or vomiting. He and his wife want to pursue SNF and hospice. We were asked to admit him for further evaluation and management.     Hospital Course:  Principal Problem:  *Hyperkalemia Active Problems:  Atrial fibrillation  Severe malnutrition  Dehydration  C. difficile colitis  Failure to thrive  Delirium    1. C diff colitis--continue oral vancomycin taper as outpatient. (Taper has been detailed in DC meds section). 2. Dehydration--improved. 3. Suspected metabolic acidosis--appears resolved, likely secondary to diarrhea.  4. Hypoglycemia--suspect secondary to poor intake (though tolerating tube feeds at home). Last episode 10/7 PM. Monitor CBGs. No recurrence. 5. Pyuria--chronic indwelling foley, suspect colonization (cultures 9/20, 8/16, 8/8 suggest same). Given lack of toxicity will await culture, but would hesitate to treat unless unstable given recurrent cdiff. 6. Normocytic anemia--lower than admission and recent studies, suspect dilutional. Monitor for recurrence of clinically significant hemorrhagic cystitis.  7. Hyperkalemia--resolved, likely secondary to supplementation. K-phos  stopped. 8. Severe deconditioning--PT 9. FTT, severe malnutrition, weight loss--PT, continue TF. 10. S/p AVR 09/2010 11. History of atrial fibrillation--continue amiodarone, ASA 12. History of hemorrhagic cystitis 13. Sacral decubitis ulcer--present on admission. No evidence of infection. 14. Disposition: Initial plans were [facility] for hospice care. Patient's wife and other family members have decided to restart tube feeds, and as such, is ineligible for Hospice Home. Plan is to transfer to SNF today. Please note that patient continues to refuse SNF placement but has been deemed  to not have decision-making capacity by myself and psychiatry.   Time spent on Discharge: Greater than 30 minutes.  SignedChaya Jan [facility] Pager: [phone] 11/01/2011, 10:00 AM

## 2011-11-01 NOTE — Progress Notes (Signed)
Clinical Social Worker facilitated pt discharge needs including contacting facility, faxing pt discharge information via [facility], notifying pt wife via telephone, providing contact number for RN to call report, and arranging ambulance transportation for pt to [facility]. No further social work needs identified at this time. Clinical Social Worker signing off.   Jacklynn Lewis, MSW, LCSWA  Clinical Social Work [phone]

## 2011-11-01 NOTE — Progress Notes (Signed)
Physical Therapy Treatment Patient Details Name: [name] MRN: [id number] DOB: 1927-01-09 Today's Date: 11/01/2011 Time: 4098-1191 PT Time Calculation (min): 18 min  PT Assessment / Plan / Recommendation Comments on Treatment Session  Pt with c/o dizziness today and was not able to progress ambulation.  He was returned to bed with air overlay and bed alarm in place and was much more comfortable and happy to watch TV and try to nap    Follow Up Recommendations        Does the patient have the potential to tolerate intense rehabilitation  No, Recommend SNF  Barriers to Discharge        Equipment Recommendations  None recommended by PT    Recommendations for Other Services    Frequency Min 3X/week   Plan Discharge plan remains appropriate;Frequency remains appropriate    Precautions / Restrictions Precautions Precautions: Fall Restrictions Weight Bearing Restrictions: No   Pertinent Vitals/Pain Pt with no c/o pain    Mobility  Bed Mobility Bed Mobility: Supine to Sit;Sit to Supine Supine to Sit: 3: Mod assist Sit to Supine: 3: Mod assist Details for Bed Mobility Assistance: verbal cues for technique, able to move lower body but needed assist for trunk Transfers Transfers: Sit to Stand;Stand to Sit Sit to Stand: With upper extremity assist;From chair/3-in-1;From bed;3: Mod assist Stand to Sit: To chair/3-in-1;4: Min assist;With upper extremity assist Details for Transfer Assistance: needs hand over hand assist to place hands to push up on bedrail and place left hand on walker handgrip Ambulation/Gait Ambulation/Gait Assistance: 4: Min guard;1: +2 Total assist Ambulation/Gait: Patient Percentage: 50% Ambulation Distance (Feet): 10 Feet Assistive device: Rolling walker Gait Pattern: Step-through pattern;Decreased stride length;Trunk flexed;Ataxic General Gait Details: pt very unsteady today.  Unable to stand upright and contol body for gait.  Distance limited and pt  returned to bed Pt c/o being dizzy today Stairs: No Wheelchair Mobility Wheelchair Mobility: No    Exercises     PT Diagnosis:    PT Problem List:   PT Treatment Interventions:     PT Goals Acute Rehab PT Goals PT Goal: Sit to Stand - Progress: Not progressing PT Goal: Stand to Sit - Progress: Not progressing PT Goal: Ambulate - Progress: Not progressing  Visit Information  Last PT Received On: 11/01/11 Assistance Needed: +2    Subjective Data  Subjective: pt states he feels wobbly today Patient Stated Goal: to go to snf today   Cognition  Overall Cognitive Status: History of cognitive impairments - at baseline Arousal/Alertness: Awake/alert Orientation Level: Appears intact for tasks assessed Behavior During Session: [facility] for tasks performed    Balance  Balance Balance Assessed: Yes Static Sitting Balance Static Sitting - Balance Support: Bilateral upper extremity supported Static Sitting - Level of Assistance: 5: Stand by assistance Static Sitting - Comment/# of Minutes: 5 pt sat on EOB for a while to 'let his head clear' before attempting ambulation Static Standing Balance Static Standing - Balance Support: Bilateral upper extremity supported;During functional activity Static Standing - Level of Assistance: 4: Min assist Static Standing - Comment/# of Minutes: 1 minute.   pt able to stand well with RW, but had balance issues when attempted to walk.  Pt impulsively wanted to walk  End of Session PT - End of Session Equipment Utilized During Treatment: Gait belt Activity Tolerance: Other (comment) (limited by dizziness and imbalance today)   GP     Bayard Hugger. [place], Standard [phone] 11/01/2011, 12:19 PM

## 2011-11-01 NOTE — Progress Notes (Addendum)
Clinical Social Worker contacted pt wife via telephone to discuss SNF bed offers and get decision for SNF placement. Clinical Social Worker updated pt wife on bed offers and pt wife chooses bed at [facility]. Clinical Social Worker contacted facility and left message with admissions coordinator in regard to bed availability for today. Per MD, pt medically stable for discharge today. Clinical Social Worker to await response from [facility]. Clinical Social Worker to continue to follow.  Addendum 10:05am: Clinical Child psychotherapist received return phone call from [facility] confirming bed availability for today. Clinical Social Worker notified MD. Clinical Social Worker to facilitate pt discharge needs this afternoon.   Jacklynn Lewis, MSW, LCSWA  Clinical Social Work [phone]

## 2011-11-05 ENCOUNTER — Ambulatory Visit: Payer: Medicare Other | Admitting: Internal Medicine

## 2011-12-08 ENCOUNTER — Encounter (HOSPITAL_COMMUNITY): Payer: Self-pay | Admitting: *Deleted

## 2011-12-08 ENCOUNTER — Encounter: Payer: Self-pay | Admitting: Internal Medicine

## 2011-12-08 ENCOUNTER — Emergency Department (HOSPITAL_COMMUNITY)
Admission: EM | Admit: 2011-12-08 | Discharge: 2011-12-08 | Disposition: A | Discharge status: Home / Self Care | Payer: Medicare Other | Attending: Emergency Medicine | Admitting: Emergency Medicine

## 2011-12-08 DIAGNOSIS — Z79899 Other long term (current) drug therapy: Secondary | ICD-10-CM | POA: Insufficient documentation

## 2011-12-08 DIAGNOSIS — Z8673 Personal history of transient ischemic attack (TIA), and cerebral infarction without residual deficits: Secondary | ICD-10-CM | POA: Insufficient documentation

## 2011-12-08 DIAGNOSIS — I2510 Atherosclerotic heart disease of native coronary artery without angina pectoris: Secondary | ICD-10-CM | POA: Insufficient documentation

## 2011-12-08 DIAGNOSIS — Z792 Long term (current) use of antibiotics: Secondary | ICD-10-CM | POA: Insufficient documentation

## 2011-12-08 DIAGNOSIS — Z7982 Long term (current) use of aspirin: Secondary | ICD-10-CM | POA: Insufficient documentation

## 2011-12-08 DIAGNOSIS — N400 Enlarged prostate without lower urinary tract symptoms: Secondary | ICD-10-CM | POA: Insufficient documentation

## 2011-12-08 DIAGNOSIS — Z8679 Personal history of other diseases of the circulatory system: Secondary | ICD-10-CM | POA: Insufficient documentation

## 2011-12-08 DIAGNOSIS — Z954 Presence of other heart-valve replacement: Secondary | ICD-10-CM | POA: Insufficient documentation

## 2011-12-08 DIAGNOSIS — D649 Anemia, unspecified: Secondary | ICD-10-CM | POA: Insufficient documentation

## 2011-12-08 DIAGNOSIS — Z5189 Encounter for other specified aftercare: Secondary | ICD-10-CM | POA: Insufficient documentation

## 2011-12-08 DIAGNOSIS — Z87891 Personal history of nicotine dependence: Secondary | ICD-10-CM | POA: Insufficient documentation

## 2011-12-08 DIAGNOSIS — K5730 Diverticulosis of large intestine without perforation or abscess without bleeding: Secondary | ICD-10-CM | POA: Insufficient documentation

## 2011-12-08 DIAGNOSIS — Z8711 Personal history of peptic ulcer disease: Secondary | ICD-10-CM | POA: Insufficient documentation

## 2011-12-08 DIAGNOSIS — Z87448 Personal history of other diseases of urinary system: Secondary | ICD-10-CM | POA: Insufficient documentation

## 2011-12-08 DIAGNOSIS — I10 Essential (primary) hypertension: Secondary | ICD-10-CM | POA: Insufficient documentation

## 2011-12-08 DIAGNOSIS — K219 Gastro-esophageal reflux disease without esophagitis: Secondary | ICD-10-CM | POA: Insufficient documentation

## 2011-12-08 DIAGNOSIS — Z8669 Personal history of other diseases of the nervous system and sense organs: Secondary | ICD-10-CM | POA: Insufficient documentation

## 2011-12-08 DIAGNOSIS — E785 Hyperlipidemia, unspecified: Secondary | ICD-10-CM | POA: Insufficient documentation

## 2011-12-08 DIAGNOSIS — Z8719 Personal history of other diseases of the digestive system: Secondary | ICD-10-CM | POA: Insufficient documentation

## 2011-12-08 DIAGNOSIS — I739 Peripheral vascular disease, unspecified: Secondary | ICD-10-CM | POA: Insufficient documentation

## 2011-12-08 DIAGNOSIS — R4182 Altered mental status, unspecified: Secondary | ICD-10-CM | POA: Insufficient documentation

## 2011-12-08 LAB — CBC WITH DIFFERENTIAL/PLATELET
Eosinophils Relative: 0 % (ref 0–5)
Lymphocytes Relative: 7 % — ABNORMAL LOW (ref 12–46)
Lymphs Abs: 0.8 10*3/uL (ref 0.7–4.0)
MCV: 86.7 fL (ref 78.0–100.0)
Neutro Abs: 8.8 10*3/uL — ABNORMAL HIGH (ref 1.7–7.7)
Neutrophils Relative %: 86 % — ABNORMAL HIGH (ref 43–77)
Platelets: 539 10*3/uL — ABNORMAL HIGH (ref 150–400)
RBC: 2.63 MIL/uL — ABNORMAL LOW (ref 4.22–5.81)
WBC: 10.2 10*3/uL (ref 4.0–10.5)

## 2011-12-08 LAB — COMPREHENSIVE METABOLIC PANEL
ALT: 16 U/L (ref 0–53)
Alkaline Phosphatase: 127 U/L — ABNORMAL HIGH (ref 39–117)
CO2: 24 mEq/L (ref 19–32)
Chloride: 98 mEq/L (ref 96–112)
GFR calc Af Amer: >90 mL/min (ref >90)
GFR calc non Af Amer: >90 mL/min (ref >90)
Glucose, Bld: 104 mg/dL — ABNORMAL HIGH (ref 70–99)
Potassium: 4.3 mEq/L (ref 3.5–5.1)
Sodium: 131 mEq/L — ABNORMAL LOW (ref 135–145)
Total Bilirubin: 0.2 mg/dL — ABNORMAL LOW (ref 0.3–1.2)

## 2011-12-08 NOTE — ED Notes (Addendum)
Report called to [facility]. Told about HGB, foley, and to feed pt upon arrival through G tube. Waiting on PTAR.

## 2011-12-08 NOTE — ED Notes (Signed)
Per EMS, pt from [facility], reports low Hgb of 6 today.  Here for PRBC transfusion.  Just finished Vanc for C-diff tx on the 15th.  Pt has a foley catheter recently replaced on the 14th.  Staff also reports hematuria noted in the foley.  Pt denies any complaints at this time.  Pt is altered per norm.

## 2011-12-08 NOTE — ED Provider Notes (Signed)
History     CSN: [id number]  Arrival date & time 12/08/11  1352   First MD Initiated Contact with Patient 12/08/11 1508      Chief Complaint  Patient presents with  . Abnormal Lab    Hgb 6    (Consider location/radiation/quality/duration/timing/severity/associated sxs/prior treatment) HPI A LEVEL 5 CAVEAT PERTAINS DUE TO ALTERED MENTAL STATUS Pt presenting with reported hemoglobin of 6 from [facility]- pt was referred here for PRBC transfusion.  Blood work was checked due to seeing blood in urine- pt had foley catheter placed approx 6 days ago.  There have been occasional blood clots.  However, per report there has been no difficulty in draining urine.  No fever/chills.  Wife states there are no other acute issues at this time.  States he has been having blood in urine since 2006 due to radiation effects.   Past Medical History  Diagnosis Date  . ADENOCARCINOMA, PROSTATE   . ALLERGIC RHINITIS   . B12 DEFICIENCY   . BENIGN PROSTATIC HYPERTROPHY   . CAROTID ARTERY DISEASE   . CORONARY ARTERY DISEASE   . DIVERTICULOSIS, COLON   . GERD   . HEMORRHOIDS   . HIATAL HERNIA   . HYPERLIPIDEMIA   . HYPERTENSION   . OSTEOPENIA   . PEPTIC ULCER DISEASE   . PERIPHERAL VASCULAR DISEASE   . Personal history of alcoholism   . TRANSIENT ISCHEMIC ATTACK   . Shingles   . Anemia   . AS (aortic stenosis)   . S/P AVR (aortic valve replacement) 09/23/2010  . Complication of anesthesia 09/23/2010    very confused after waking up. Stopped drinking 40 days before this surgery.  . Transfusion reaction, chill fever type 04/28/2011    Fever, rigors, tachycardia,tachypnea but no evidence of hemolysis  . Hemorrhagic cystitis 04/28/2011    Due to previous radiation for PSA recurrence of prostate cancer  . Blood transfusion   . Gross hematuria   . CVA (cerebrovascular accident) 08/29/2011    08/28/11  . C. difficile colitis     Past Surgical History  Procedure Date  . Popliteal synovial cyst  excision 1998  . Carotid endarterectomy 1999    right, Left carotid total chronic occlusion  . Prostate surgery 2001  . Coronary angioplasty with stent placement 2004  . Aortic valve replacement 09/23/2010    #41mm [facility] pericardial tissue valve  . Coronary artery bypass graft 09/23/2010    CABG x1 using LIMA to LAD  . Cystoscopy with biopsy 02/15/2011    Procedure: CYSTOSCOPY WITH BIOPSY;  Surgeon: Anner Crete, MD;  Location: [facility];  Service: Urology;  Laterality: N/A;  Cystoscopy, biopsy and fulguration of the bladder  . Esophagogastroduodenoscopy 10/15/2011    Procedure: ESOPHAGOGASTRODUODENOSCOPY (EGD);  Surgeon: Meryl Dare, MD,FACG;  Location: [facility];  Service: Endoscopy;  Laterality: N/A;  . Peg placement 10/15/2011    Procedure: PERCUTANEOUS ENDOSCOPIC GASTROSTOMY (PEG) PLACEMENT;  Surgeon: Meryl Dare, MD,FACG;  Location: [facility];  Service: Endoscopy;  Laterality: N/A;    Family History  Problem Relation Age of Onset  . Colon cancer Mother 3  . Diabetes Sister   . Hypertension Sister   . Coronary artery disease Sister   . Multiple sclerosis Sister   . Stroke Brother     History  Substance Use Topics  . Smoking status: Former Smoker    Quit date: 02/14/1958  . Smokeless tobacco: Never Used  . Alcohol Use: No  Comment: last drink 3 days ago, he has not been drinking as much      Review of Systems UNABLE TO OBTAIN ROS DUE TO LEVEL 5 CAVEAT  Allergies  Tolterodine tartrate  Home Medications   Current Outpatient Rx  Name  Route  Sig  Dispense  Refill  . ACETAMINOPHEN 160 MG/5ML PO SOLN   Oral   Take 20.3 mLs (650 mg total) by mouth every 6 (six) hours as needed.   120 mL      . AMIODARONE HCL 200 MG PO TABS   Feeding Tube   200 mg by Feeding Tube route 2 (two) times daily.          . ASPIRIN 81 MG PO TABS   Feeding Tube   81 mg by Feeding Tube route daily.          Marland Kitchen CITALOPRAM HYDROBROMIDE 20 MG PO TABS    Feeding Tube   20 mg by Feeding Tube route daily.         Marland Kitchen DIPHENOXYLATE-ATROPINE 2.5-0.025 MG PO TABS   Feeding Tube   1 tablet by Feeding Tube route 4 (four) times daily as needed. diarrhea         . FERROUS SULFATE 325 (65 FE) MG PO TABS   Per Tube   Place 1 tablet (325 mg total) into feeding tube daily with breakfast.         . FLUCONAZOLE 100 MG PO TABS   Per Tube   Place 1 tablet (100 mg total) into feeding tube daily.   12 tablet   0   . LEVOTHYROXINE SODIUM 25 MCG PO TABS   Feeding Tube   25 mcg by Feeding Tube route daily before breakfast.          . LORAZEPAM 0.5 MG PO TABS   Oral   Take 1 tablet (0.5 mg total) by mouth every 4 (four) hours as needed.   30 tablet   0   . MIRTAZAPINE 30 MG PO TABS   Feeding Tube   30 mg by Feeding Tube route daily.         . ADULT MULTIVITAMIN W/MINERALS CH   Feeding Tube   1 tablet by Feeding Tube route daily.          . OSMOLITE 1.5 CAL PO LIQD   Per Tube   Place 237 mLs into feeding tube 4 (four) times daily. Give 60 ml water flushes before and after each can of feed and give an additional 120 mL water flushes 3 times per day   12 Bottle   10   . OMEPRAZOLE 20 MG PO CPDR   Feeding Tube   20 mg by Feeding Tube route 2 (two) times daily.          Marland Kitchen RISPERIDONE 1 MG PO TABS   Per Tube   Place 1 tablet (1 mg total) into feeding tube 2 (two) times daily.   60 tablet   0   . SACCHAROMYCES BOULARDII 250 MG PO CAPS   Feeding Tube   250 mg by Feeding Tube route 2 (two) times daily.         . THIAMINE HCL 100 MG PO TABS   Feeding Tube   100 mg by Feeding Tube route daily.          Marland Kitchen VANCOMYCIN 50 MG/ML ORAL SOLUTION   Per Tube   Place 2.5 mLs (125 mg total) into feeding tube 4 (four) times daily.  1 mL   0   . VANCOMYCIN 50 MG/ML ORAL SOLUTION   Per Tube   Place 2.5 mLs (125 mg total) into feeding tube 2 (two) times daily.   1 mL   0   . VANCOMYCIN 50 MG/ML ORAL SOLUTION   Per Tube   Place  2.5 mLs (125 mg total) into feeding tube daily.   1 mL   0   . VANCOMYCIN 50 MG/ML ORAL SOLUTION   Oral   Take 2.5 mLs (125 mg total) by mouth every other day.   1 mL   0   . VANCOMYCIN 50 MG/ML ORAL SOLUTION   Per Tube   Place 2.5 mLs (125 mg total) into feeding tube every 3 (three) days.   1 mL   0     BP 122/57  Pulse 66  Temp 98.2 F (36.8 C) (Oral)  Resp 15  SpO2 98% Vitals reviewed Physical Exam Physical Examination: General appearance - alert, chronically ill appearing, and in no distress Mental status - alert, not oriented- this is his baseline Eyes - no scleral icterus, no conjunctival injection, + conjunctival pallor Mouth - mucous membranes moist, pharynx normal without lesions Chest - clear to auscultation, no wheezes, rales or rhonchi, symmetric air entry Heart - normal rate, regular rhythm, normal S1, S2, no murmurs, rubs, clicks or gallops Abdomen - soft, nontender, nondistended, no masses or organomegaly GU Male - no penile lesions, foley in place Extremities - peripheral pulses normal, warm and dry Skin - pale, no rashes, no suspicious skin lesions noted  ED Course  Procedures (including critical care time)  Labs Reviewed  CBC WITH DIFFERENTIAL - Abnormal; Notable for the following:    RBC 2.63 (*)     Hemoglobin 7.4 (*)     HCT 22.8 (*)     RDW 16.3 (*)     Platelets 539 (*)     Neutrophils Relative 86 (*)     Neutro Abs 8.8 (*)     Lymphocytes Relative 7 (*)     All other components within normal limits  COMPREHENSIVE METABOLIC PANEL - Abnormal; Notable for the following:    Sodium 131 (*)     Glucose, Bld 104 (*)     Albumin 1.5 (*)     Alkaline Phosphatase 127 (*)     Total Bilirubin 0.2 (*)     All other components within normal limits  TYPE AND SCREEN   No results found.   1. Anemia       MDM  Pt presenting with report of hgb being 6 and needing blood transfusion.  Bloodwork rechecked here and hgb 7.4  This is only slightly  lower than previous of 7.8 several weeks ago.  Foley catheter was irrigated due to no urine in bag- however unknown when this was emptied at [facility].  No obstruction of catheter.  Pt discharged back to NH for further care, no acute intervention necessary at this time and wife and bedside is agreeable with this plan.         Ethelda Chick, MD 12/08/11 [phone]

## 2011-12-08 NOTE — ED Notes (Signed)
ZOX:[id number]<AV> Expected date:<BR> Expected time:<BR> Means of arrival:Ambulance<BR> Comments:<BR> ems-need blood transfusion

## 2011-12-13 ENCOUNTER — Ambulatory Visit: Payer: Medicare Other | Admitting: Cardiology

## 2011-12-14 ENCOUNTER — Emergency Department (HOSPITAL_COMMUNITY)
Admission: EM | Admit: 2011-12-14 | Discharge: 2011-12-14 | Disposition: A | Discharge status: Skilled Nursing Facility | Payer: Medicare Other | Attending: Emergency Medicine | Admitting: Emergency Medicine

## 2011-12-14 ENCOUNTER — Encounter (HOSPITAL_COMMUNITY): Payer: Self-pay | Admitting: *Deleted

## 2011-12-14 ENCOUNTER — Telehealth: Payer: Self-pay

## 2011-12-14 DIAGNOSIS — R319 Hematuria, unspecified: Secondary | ICD-10-CM

## 2011-12-14 DIAGNOSIS — Z87891 Personal history of nicotine dependence: Secondary | ICD-10-CM | POA: Insufficient documentation

## 2011-12-14 DIAGNOSIS — Z8739 Personal history of other diseases of the musculoskeletal system and connective tissue: Secondary | ICD-10-CM | POA: Insufficient documentation

## 2011-12-14 DIAGNOSIS — F0390 Unspecified dementia without behavioral disturbance: Secondary | ICD-10-CM | POA: Insufficient documentation

## 2011-12-14 DIAGNOSIS — I739 Peripheral vascular disease, unspecified: Secondary | ICD-10-CM | POA: Insufficient documentation

## 2011-12-14 DIAGNOSIS — K219 Gastro-esophageal reflux disease without esophagitis: Secondary | ICD-10-CM | POA: Insufficient documentation

## 2011-12-14 DIAGNOSIS — K5730 Diverticulosis of large intestine without perforation or abscess without bleeding: Secondary | ICD-10-CM | POA: Insufficient documentation

## 2011-12-14 DIAGNOSIS — Z8673 Personal history of transient ischemic attack (TIA), and cerebral infarction without residual deficits: Secondary | ICD-10-CM | POA: Insufficient documentation

## 2011-12-14 DIAGNOSIS — I10 Essential (primary) hypertension: Secondary | ICD-10-CM | POA: Insufficient documentation

## 2011-12-14 DIAGNOSIS — Z87448 Personal history of other diseases of urinary system: Secondary | ICD-10-CM | POA: Insufficient documentation

## 2011-12-14 DIAGNOSIS — Z8679 Personal history of other diseases of the circulatory system: Secondary | ICD-10-CM | POA: Insufficient documentation

## 2011-12-14 DIAGNOSIS — C61 Malignant neoplasm of prostate: Secondary | ICD-10-CM | POA: Insufficient documentation

## 2011-12-14 DIAGNOSIS — I359 Nonrheumatic aortic valve disorder, unspecified: Secondary | ICD-10-CM | POA: Insufficient documentation

## 2011-12-14 DIAGNOSIS — E785 Hyperlipidemia, unspecified: Secondary | ICD-10-CM | POA: Insufficient documentation

## 2011-12-14 DIAGNOSIS — I2510 Atherosclerotic heart disease of native coronary artery without angina pectoris: Secondary | ICD-10-CM | POA: Insufficient documentation

## 2011-12-14 DIAGNOSIS — D649 Anemia, unspecified: Secondary | ICD-10-CM

## 2011-12-14 DIAGNOSIS — Z8619 Personal history of other infectious and parasitic diseases: Secondary | ICD-10-CM | POA: Insufficient documentation

## 2011-12-14 DIAGNOSIS — K3189 Other diseases of stomach and duodenum: Secondary | ICD-10-CM | POA: Insufficient documentation

## 2011-12-14 DIAGNOSIS — E538 Deficiency of other specified B group vitamins: Secondary | ICD-10-CM | POA: Insufficient documentation

## 2011-12-14 DIAGNOSIS — Z79899 Other long term (current) drug therapy: Secondary | ICD-10-CM | POA: Insufficient documentation

## 2011-12-14 LAB — BASIC METABOLIC PANEL WITH GFR
CO2: 26 meq/L (ref 19–32)
Calcium: 8.6 mg/dL (ref 8.4–10.5)
Creatinine, Ser: 0.55 mg/dL (ref 0.50–1.35)
Glucose, Bld: 101 mg/dL — ABNORMAL HIGH (ref 70–99)

## 2011-12-14 LAB — URINE MICROSCOPIC-ADD ON

## 2011-12-14 LAB — CBC
HCT: 22.5 % — ABNORMAL LOW (ref 39.0–52.0)
Hemoglobin: 7 g/dL — ABNORMAL LOW (ref 13.0–17.0)
MCH: 27.3 pg (ref 26.0–34.0)
MCHC: 31.1 g/dL (ref 30.0–36.0)
MCV: 87.9 fL (ref 78.0–100.0)
Platelets: 486 K/uL — ABNORMAL HIGH (ref 150–400)
RBC: 2.56 MIL/uL — ABNORMAL LOW (ref 4.22–5.81)
RDW: 16.3 % — ABNORMAL HIGH (ref 11.5–15.5)
WBC: 7.8 10*3/uL (ref 4.0–10.5)

## 2011-12-14 LAB — BASIC METABOLIC PANEL
BUN: 23 mg/dL (ref 6–23)
Chloride: 100 mEq/L (ref 96–112)
GFR calc Af Amer: >90 mL/min (ref >90)
GFR calc non Af Amer: >90 mL/min (ref >90)
Potassium: 4.2 mEq/L (ref 3.5–5.1)
Sodium: 133 mEq/L — ABNORMAL LOW (ref 135–145)

## 2011-12-14 LAB — URINALYSIS, ROUTINE W REFLEX MICROSCOPIC
Bilirubin Urine: NEGATIVE
Glucose, UA: NEGATIVE mg/dL
Ketones, ur: NEGATIVE mg/dL
Nitrite: NEGATIVE
Protein, ur: 100 mg/dL — AB
Specific Gravity, Urine: 1.026 (ref 1.005–1.030)
Urobilinogen, UA: 0.2 mg/dL (ref 0.0–1.0)
pH: 7 (ref 5.0–8.0)

## 2011-12-14 LAB — SAMPLE TO BLOOD BANK

## 2011-12-14 LAB — PROTIME-INR
INR: 1.03 (ref 0.00–1.49)
Prothrombin Time: 13.4 seconds (ref 11.6–15.2)

## 2011-12-14 LAB — PREPARE RBC (CROSSMATCH)

## 2011-12-14 LAB — APTT: aPTT: 34 seconds (ref 24–37)

## 2011-12-14 NOTE — ED Notes (Signed)
Wife left to go home

## 2011-12-14 NOTE — Telephone Encounter (Signed)
Pt did have Hgb per home health at similar level last wk, then repeat at ER was 7.4, and since o/w stable was not felt to need transfusion or other change in tx.    All I can do is go by the new Nov 25 hgb now at 6.0, and recommend transfer back to ER for eval for severe anemia given his hx of ongoing chronic slow blood loss radiation GU related; I understand pt is now nonambulatory, and likely would not have overt exertional intolerance related to this, but should at least have repeat Hgb in ER

## 2011-12-14 NOTE — ED Notes (Signed)
Blood Ended

## 2011-12-14 NOTE — ED Notes (Signed)
Foley catheter draining reddish colored urine.  No bleeding at the insertion site.  MD aware.

## 2011-12-14 NOTE — ED Notes (Signed)
Report received from Ellen, RN 

## 2011-12-14 NOTE — Telephone Encounter (Signed)
I suspect he may get transfusion this time at the ER.  Ok to hold on further for now

## 2011-12-14 NOTE — ED Provider Notes (Signed)
Patient in CDU awaiting completion of transfusion of one unit PRBC's.  Patient tolerated transfusion well.  Patient discharged per plan to his nursing facility where he will be followed-up by the facility medical provider.  Jimmye Norman, NP 12/14/11 2049

## 2011-12-14 NOTE — ED Provider Notes (Addendum)
History     CSN: [id number]  Arrival date & time 12/14/11  1305   First MD Initiated Contact with Patient 12/14/11 1323      Chief Complaint  Patient presents with  . Hematuria  . Anemia    (Consider location/radiation/quality/duration/timing/severity/associated sxs/prior treatment) HPI Comments: Pt with h/o dementia so level 5 caveat.  Pt is at [facility] and I spoke to caregiver.  Pt has been there about 1 month after admission to [facility].  Pt has intermittent problems with hematuria, has foley catheter in place, is receiving IM rocephin for UTI.  Pt is not on coumadin, plavix, was on baby aspirin but has since been stopped.  For past 3 days, hematuria is worse and more constant.  Pt denies pain, feeling weak,  Tired, light headed.  Pt has had prior blood tests revealing a Hgb of 7.4 last week.  Today, had report of Hgb of 6.0 which is from blood drawn yesterday run by outside agency.  Pt sent here presumably for evaluation and/pr transfusion.  Pt's PCP normally is Dr. [name], was seeing Dr. Glade Lloyd with [facility] while at rehab facility.  Per RN, pt was supposed to be seen by urology, but no opportunity has arisen yet.    Patient is a 78 y.o. male presenting with hematuria and anemia. The history is provided by the patient, medical records, the EMS personnel, a caregiver and the nursing home.  Hematuria  Anemia    Past Medical History  Diagnosis Date  . ADENOCARCINOMA, PROSTATE   . ALLERGIC RHINITIS   . B12 DEFICIENCY   . BENIGN PROSTATIC HYPERTROPHY   . CAROTID ARTERY DISEASE   . CORONARY ARTERY DISEASE   . DIVERTICULOSIS, COLON   . GERD   . HEMORRHOIDS   . HIATAL HERNIA   . HYPERLIPIDEMIA   . HYPERTENSION   . OSTEOPENIA   . PEPTIC ULCER DISEASE   . PERIPHERAL VASCULAR DISEASE   . Personal history of alcoholism   . TRANSIENT ISCHEMIC ATTACK   . Shingles   . Anemia   . AS (aortic stenosis)   . S/P AVR (aortic valve replacement) 09/23/2010  . Complication of anesthesia 09/23/2010      very confused after waking up. Stopped drinking 40 days before this surgery.  . Transfusion reaction, chill fever type 04/28/2011    Fever, rigors, tachycardia,tachypnea but no evidence of hemolysis  . Hemorrhagic cystitis 04/28/2011    Due to previous radiation for PSA recurrence of prostate cancer  . Blood transfusion   . Gross hematuria   . CVA (cerebrovascular accident) 08/29/2011    08/28/11  . C. difficile colitis     Past Surgical History  Procedure Date  . Popliteal synovial cyst excision 1998  . Carotid endarterectomy 1999    right, Left carotid total chronic occlusion  . Prostate surgery 2001  . Coronary angioplasty with stent placement 2004  . Aortic valve replacement 09/23/2010    #58mm [facility] Ease pericardial tissue valve  . Coronary artery bypass graft 09/23/2010    CABG x1 using LIMA to LAD  . Cystoscopy with biopsy 02/15/2011    Procedure: CYSTOSCOPY WITH BIOPSY;  Surgeon: Anner Crete, MD;  Location: [facility];  Service: Urology;  Laterality: N/A;  Cystoscopy, biopsy and fulguration of the bladder  . Esophagogastroduodenoscopy 10/15/2011    Procedure: ESOPHAGOGASTRODUODENOSCOPY (EGD);  Surgeon: Meryl Dare, MD,FACG;  Location: [facility];  Service: Endoscopy;  Laterality: N/A;  . Peg placement 10/15/2011    Procedure:  PERCUTANEOUS ENDOSCOPIC GASTROSTOMY (PEG) PLACEMENT;  Surgeon: Meryl Dare, MD,FACG;  Location: [facility];  Service: Endoscopy;  Laterality: N/A;    Family History  Problem Relation Age of Onset  . Colon cancer Mother 31  . Diabetes Sister   . Hypertension Sister   . Coronary artery disease Sister   . Multiple sclerosis Sister   . Stroke Brother     History  Substance Use Topics  . Smoking status: Former Smoker    Quit date: 02/14/1958  . Smokeless tobacco: Never Used  . Alcohol Use: No     Comment: last drink 3 days ago, he has not been drinking as much      Review of Systems  Unable to perform ROS: Dementia   Genitourinary: Positive for hematuria.    Allergies  Tolterodine tartrate  Home Medications   Current Outpatient Rx  Name  Route  Sig  Dispense  Refill  . ACETAMINOPHEN 325 MG PO TABS   Per Tube   Place 650 mg into feeding tube every 6 (six) hours as needed. pain         . CEFTRIAXONE SODIUM 1 G IJ SOLR   Intramuscular   Inject 1 g into the muscle once. Reconstitute with 2.31ml of lidocaine and administer 1gm intramuscularly daily for 10 days.  Started 12/07/11         . CITALOPRAM HYDROBROMIDE 20 MG PO TABS   Feeding Tube   20 mg by Feeding Tube route daily.         . COLLAGENASE 250 UNIT/GM EX OINT   Topical   Apply 1 application topically daily. Cleanse LT4 with normal saline and apply santyl and bactroban to wound bed, apply foam and cover daily until healed         . COLLAGENASE 250 UNIT/GM EX OINT   Topical   Apply 1 application topically 4 (four) times daily as needed. Cleanse sacrum with normal saline, apply 2mm layer of santyl, apply triple antiobiotic ointment to wound bed, change every shift and replace as needed         . PRO-STAT 64 PO LIQD   Feeding Tube   30 mLs by Feeding Tube route 2 (two) times daily.          Marland Kitchen FERROUS SULFATE 220 (44 FE) MG/5ML PO ELIX   Feeding Tube   330 mg by Feeding Tube route daily.         Marland Kitchen LEVOTHYROXINE SODIUM 25 MCG PO TABS   Feeding Tube   37.5 mcg by Feeding Tube route daily before breakfast.          . LIDOCAINE HCL (LOCAL ANESTH.) 1 % IJ KIT   Injection   Inject 2.1 mLs as directed daily. To reconstitute ceftriaxone for IM dose         . LORAZEPAM 0.5 MG PO TABS   Feeding Tube   0.5 mg by Feeding Tube route every 4 (four) hours as needed. For agitation         . ADULT MULTIVITAMIN W/MINERALS CH   Feeding Tube   1 tablet by Feeding Tube route daily.          Marland Kitchen MUPIROCIN 2 % EX OINT   Topical   Apply 1 application topically daily. Cleanse LT4 with normal saline and apply santyl and bactroban  to wound bed, apply foam and cover daily until healed         . TRIPLE ANTIBIOTIC [phone] EX OINT  Topical   Apply 1 application topically 4 (four) times daily as needed. Cleanse sacrum with normal saline, apply 2mm layer of santyl, apply triple antiobiotic ointment to wound bed, change every shift and replace as needed         . OMEPRAZOLE 20 MG PO CPDR   Feeding Tube   20 mg by Feeding Tube route 2 (two) times daily.          Marland Kitchen POTASSIUM CHLORIDE 20 MEQ/15ML (10%) PO LIQD   Feeding Tube   20 mEq by Feeding Tube route daily.         Marland Kitchen SACCHAROMYCES BOULARDII 250 MG PO CAPS   Feeding Tube   250 mg by Feeding Tube route daily.          . THIAMINE HCL 100 MG PO TABS   Feeding Tube   100 mg by Feeding Tube route daily.          Marland Kitchen VITAMIN C 500 MG PO TABS   Oral   Take 500 mg by mouth 2 (two) times daily.         Marland Kitchen ZINC SULFATE 220 MG PO CAPS   Oral   Take 220 mg by mouth daily.         . AMIODARONE HCL 200 MG PO TABS   Feeding Tube   200 mg by Feeding Tube route daily.          Marland Kitchen DIPHENOXYLATE-ATROPINE 2.5-0.025 MG PO TABS   Feeding Tube   1 tablet by Feeding Tube route 4 (four) times daily as needed. diarrhea           BP 114/58  Pulse 63  Temp 97.3 F (36.3 C) (Oral)  Resp 19  SpO2 100%  Physical Exam  Nursing note and vitals reviewed. Constitutional: He appears well-developed and well-nourished. No distress.  HENT:  Head: Normocephalic and atraumatic.  Eyes: Pupils are equal, round, and reactive to light. No scleral icterus.  Neck: Normal range of motion.  Cardiovascular: Normal rate and regular rhythm.   No murmur heard. Pulmonary/Chest: Effort normal. No respiratory distress.  Abdominal: Soft. He exhibits no distension. There is no tenderness. There is no rebound and no guarding.  Musculoskeletal: He exhibits no edema.  Neurological: He is alert.  Skin: Skin is warm. He is not diaphoretic.  Psychiatric: He has a normal mood and  affect.    ED Course  Procedures (including critical care time)  Labs Reviewed  CBC - Abnormal; Notable for the following:    RBC 2.56 (*)     Hemoglobin 7.0 (*)     HCT 22.5 (*)     RDW 16.3 (*)     Platelets 486 (*)     All other components within normal limits  BASIC METABOLIC PANEL - Abnormal; Notable for the following:    Sodium 133 (*)     Glucose, Bld 101 (*)     All other components within normal limits  SAMPLE TO BLOOD BANK  APTT  PROTIME-INR  URINALYSIS, ROUTINE W REFLEX MICROSCOPIC  PREPARE RBC (CROSSMATCH)   No results found.   1. Anemia   2. Hematuria     ra sat is 100% and i interpret to be normal  2:35 PM ECG at time 13:31 shows SR at rate 69, poor quality with wandering baseline, normal axis, non specific ST changes, likely due to wandering baseline.  Serial comparison is not capable.    Pt's Hgb is 7.0, not 6.0 as previously  thought.  Will discuss with Dr. Glade Lloyd and/or Dr. [name] to help decide what steps to take next.  Pt likely needs urology assessment.  At this point, no symptoms of anemia present, will see if transfusion is desired at 7.0 today.    2:47 PM I Spoke to Dr. Glade Lloyd who reports she has not seen patient since admission to facility on 10/21.  She was unaware of situation involving anemia, hematuria.  She suggested transfusing 1 unit of PRBC's and she will round on pt tomorrow at [facility] and ensure that pt is seen by urology as well.  Will put in CDU for 1 unit of blood and then can go back to [employer].    MDM  Will recheck Hgb, no symptoms of weakness, HA, CP, SOB.  BP is normal, HR is normal.  Hold clot is sent to blood bank.          Gavin Pound. Oletta Lamas, MD 12/14/11 1436  Gavin Pound. Cecilia Nishikawa, MD 12/14/11 1541

## 2011-12-14 NOTE — ED Provider Notes (Signed)
Medical screening examination/treatment/procedure(s) were performed by non-physician practitioner and as supervising physician I was immediately available for consultation/collaboration.   Madix Blowe H Daijon Wenke, MD 12/14/11 2319 

## 2011-12-14 NOTE — ED Notes (Signed)
Per report from [facility] pt is from [facility].  He reportedly had some blood work drawn showing a hemaglobin of 6.0 (paperwork with pt).  He also has noted hematuria to his foley bag.  No distress noted.  Resp symmetrical and unlabored.  Skin warm and dry though Pale.

## 2011-12-14 NOTE — Telephone Encounter (Signed)
HHRN Selena Batten) called to report the patients hgb was 6.0 (12/13/11 report on MD's desk) please adviseon instructions.

## 2011-12-14 NOTE — Telephone Encounter (Signed)
Called [facility] and informed [facility] instructions. They agreed to send the patient to the ER.  Their concern is repeating was happened last week and ER results being higher that result from yesterday 12/13/11.  They had looked into short stay for a transfusion but next week was the earliest to get him on the schedule and PCP would need to order.  Please advise

## 2011-12-14 NOTE — ED Notes (Signed)
Pt denies dizziness, lightheadedness and pain. Pt fixated on his sick mother but oriented to person and place.

## 2011-12-15 ENCOUNTER — Ambulatory Visit: Payer: Medicare Other | Admitting: Cardiology

## 2011-12-15 LAB — TYPE AND SCREEN
ABO/RH(D): O NEG
Antibody Screen: NEGATIVE
Unit division: 0
Unit division: 0

## 2011-12-31 ENCOUNTER — Telehealth: Payer: Self-pay | Admitting: Cardiology

## 2011-12-31 ENCOUNTER — Telehealth: Payer: Self-pay | Admitting: *Deleted

## 2011-12-31 NOTE — Telephone Encounter (Signed)
Left msg on triage wanting to let md know [name] pass away on Dec. 9th...lmb

## 2011-12-31 NOTE — Telephone Encounter (Signed)
Per spouse call pt passed away on 01-12-2012

## 2011-12-31 NOTE — Telephone Encounter (Signed)
noted 

## 2011-12-31 NOTE — Telephone Encounter (Signed)
Will forward to Dr Antoine Poche for his knowledge

## 2012-01-06 ENCOUNTER — Ambulatory Visit: Payer: Self-pay | Admitting: Cardiology

## 2012-01-19 DEATH — deceased

## 2012-06-13 IMAGING — CT CT ABD-PELV W/ CM
2 of 7 series · 15 of 46 positions shown, 17 images · IV contrast ([ID] [id number])
Comparison: CT abdomen pelvis - 06/09/2011; CT guided trans cluneal
drainage catheter placement - 06/03/2011

CLINICAL DATA: Evaluate abscess, history of diverticulitis

CT ABDOMEN AND PELVIS WITH CONTRAST
TECHNIQUE: Multidetector CT imaging of the abdomen and pelvis was
performed following the standard protocol during bolus
administration of intravenous contrast.
Contrast: 100mL OMNIPAQUE IOHEXOL 300 MG/ML  SOLN

[Series 2: rtn a/p with · axial · 0.74mm/px · z∈[-492,-102]mm · 12 of 88 slices shown, 14 images]
[im 5/88  soft-tissue]
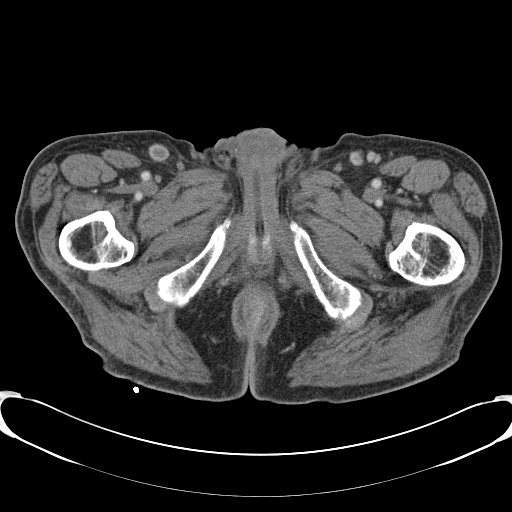
[im 5/88  bone]
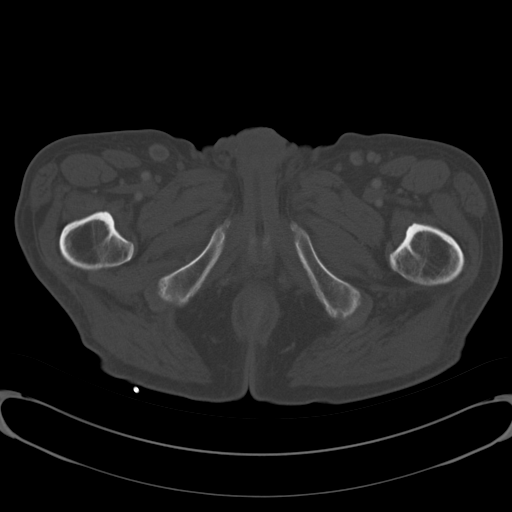
[im 15/88  soft-tissue]
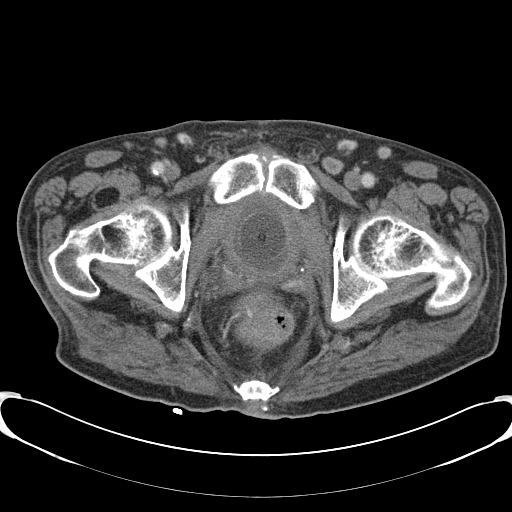
[im 20/88  soft-tissue]
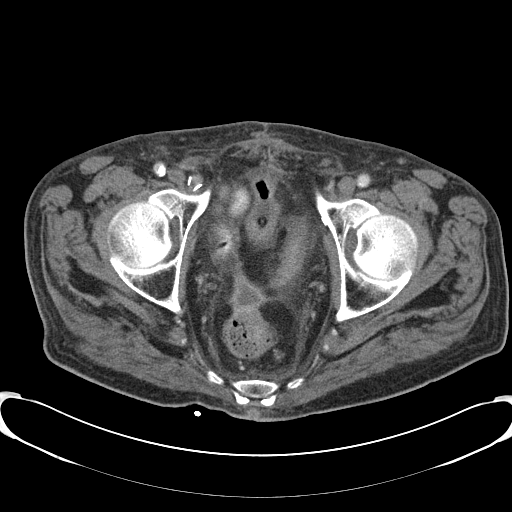
[im 25/88  soft-tissue]
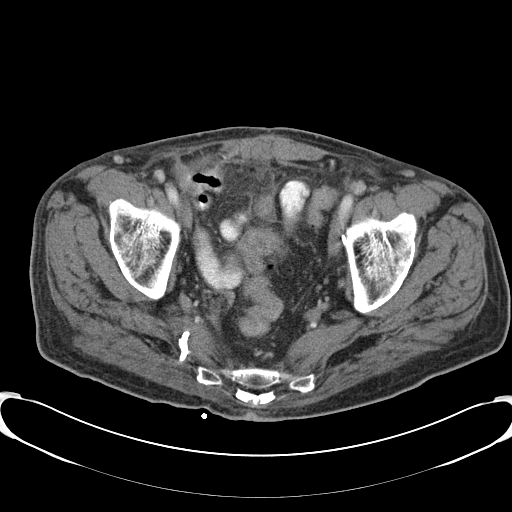
[im 34/88  soft-tissue]
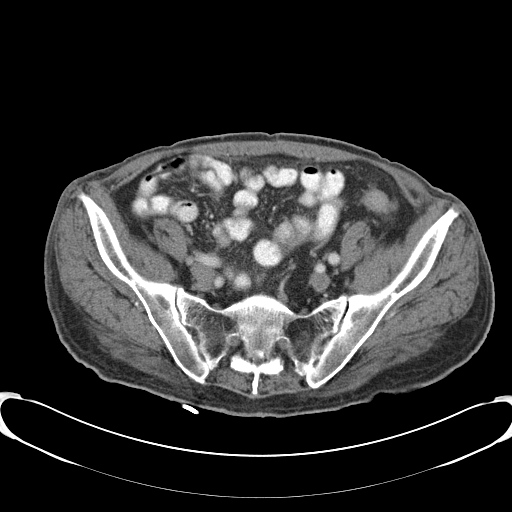
[im 39/88  soft-tissue]
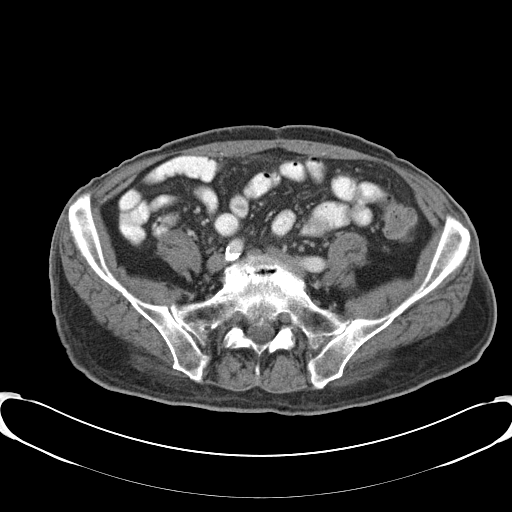
[im 49/88  soft-tissue]
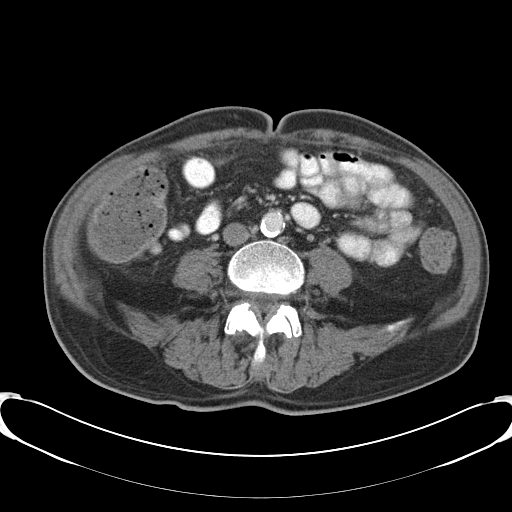
[im 54/88  soft-tissue]
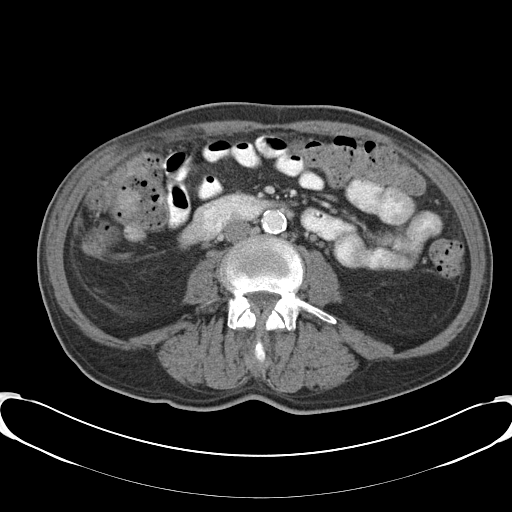
[im 63/88  soft-tissue]
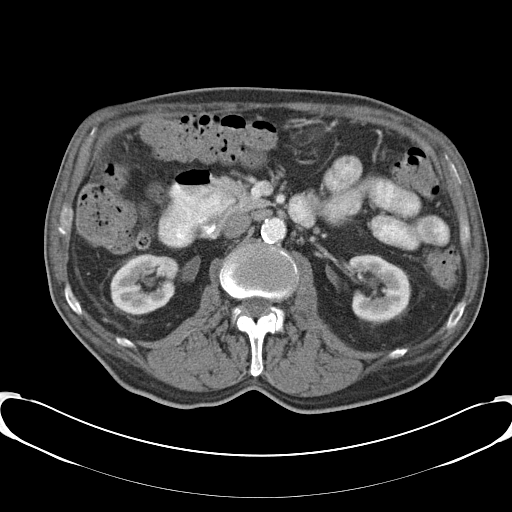
[im 63/88  bone]
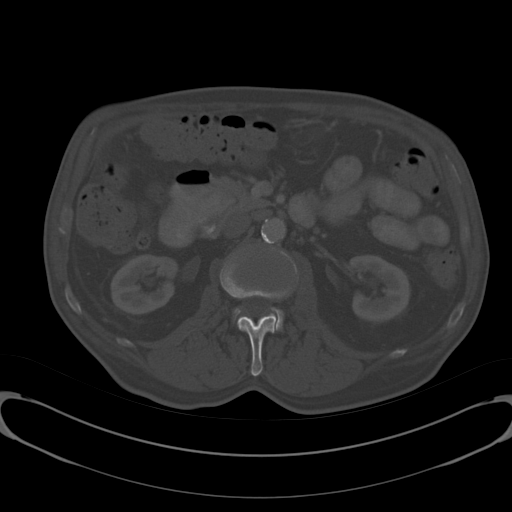
[im 68/88  soft-tissue]
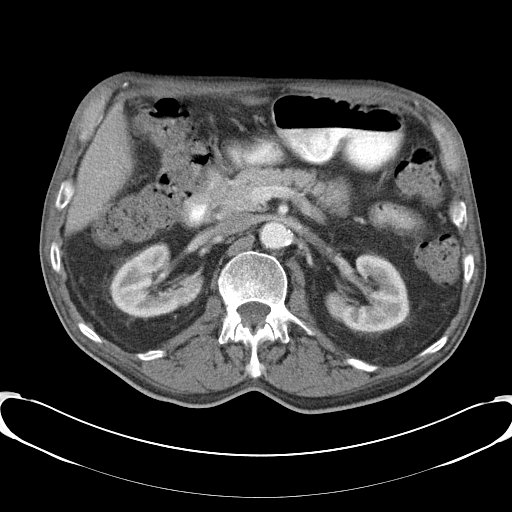
[im 73/88  soft-tissue]
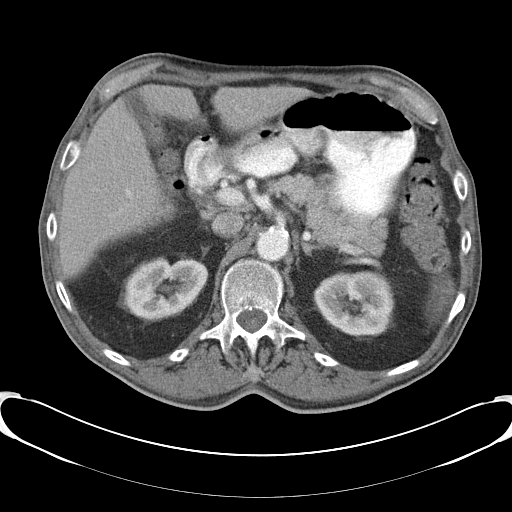
[im 83/88  soft-tissue]
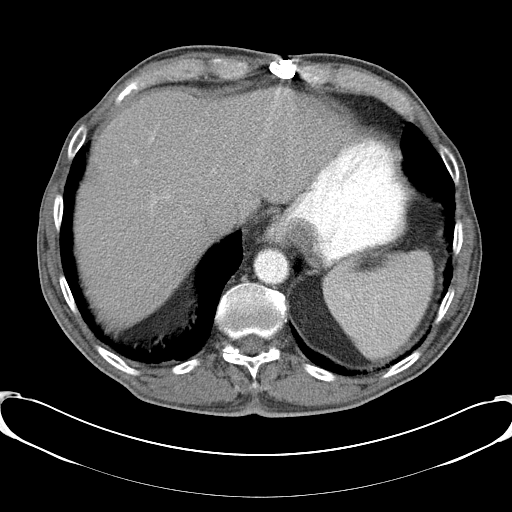

[Series 602: <mpr thick range> · coronal · 0.85mm/px · 3 of 88 slices shown]
[im 30/88  soft-tissue]
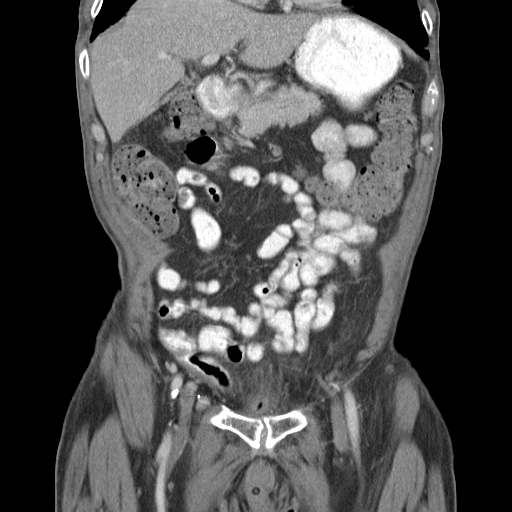
[im 39/88  soft-tissue]
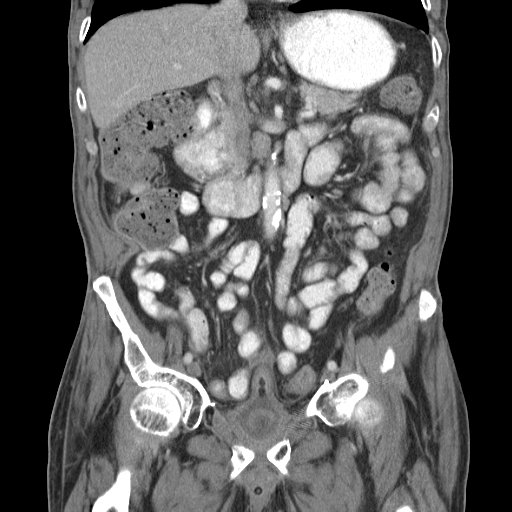
[im 49/88  soft-tissue]
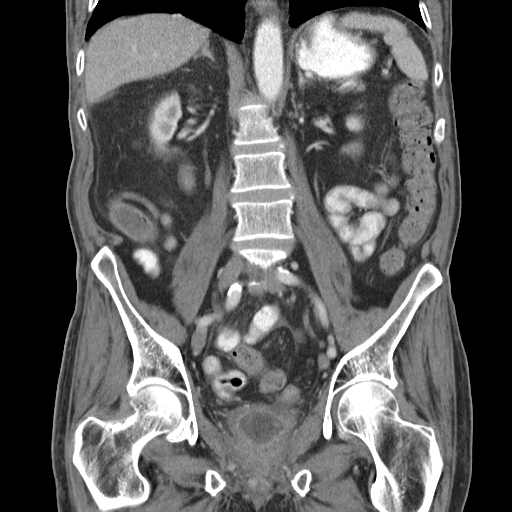

[15 of 46 positions shown; findings below may reference images not displayed]

FINDINGS: Interval retraction of previously noted right transgluteal approach
drainage catheter with now coiled within the right posterior
gluteal musculature.  There is a small (approximately 1.7 x 1.7 cm)
residual fluid collection at the site of previously identified
diverticular abscess  (image 69, series 2), too small for
percutaneous drainage.  Previously identified approximately 3.5 cm
abscess within the more cranial pelvis has resolved in the
interval.  No new fluid collections.

Ingested enteric contrast extends to the level of the cecum.
Moderate colonic stool burden without evidence of enteric
obstruction.  Normal appendix.  No pneumoperitoneum, pneumatosis or
portal venous gas.

Normal hepatic contour.  No discrete hepatic lesions.
Cholelithiasis.  Gallbladder is understood and otherwise normal.
There is symmetric enhancement and excretion of the bilateral
kidneys.  No discrete renal lesions.  Grossly symmetric likely age
related perinephric stranding.  No urinary obstruction.  The
bilateral adrenal glands, pancreas and spleen are normal.
Incidental note is made of a small splenule.

Rather extensive atherosclerotic calcifications within a normal
caliber abdominal aorta.  The major branch vessels of the abdominal
aorta, including the IMA, are patent.  Focal likely hemodynamically
significant stenosis of the right common femoral artery (image 69,
series 2).  Incidental note is made of duplicated left renal
arteries.  No retroperitoneal, mesenteric or pelvic
lymphadenopathy.  Grossly symmetric bilateral inguinal lymph nodes
are likely reactive in etiology.

Foley catheter and small amount of air within the decompressed
urinary bladder.  No free fluid in the pelvis.

Limited visualization of the lower thorax demonstrates minimal
increased and left basilar dependent opacities, possibly
atelectasis.  Likely pleural calcification along the subpleural
aspect of the right lower lung.  Mitral valve annular
calcifications.

No acute or aggressive osseous abnormalities.  Degenerative change
of L5 - S1.
IMPRESSION: 1.  Interval retraction of the right transgluteal approach pigtail
catheter, now malpositioned within the right buttocks/pelvic
musculature.

2.  Small residual fluid collection at original site of drainage
catheter measuring approximately 1.7 cm in greatest diameter, too
small for percutaneous drainage.  Interval resolution of additional
abscess within the more cranial pelvis.  No new fluid collections.

3.  Moderate colonic stool burden without evidence of enteric
obstruction.

4.  Extensive atherosclerotic disease within a normal caliber
abdominal aorta.  Likely hemodynamically significant
atherosclerotic narrowing within the right common femoral artery.
4. Cholelithiasis without evidence of cholecystitis.

Plan:

Will remove existing malpositioned transcranial drainage catheter.
As residual small fluid collection at original site if the drainage
catheter is too small for percutaneous drainage, no further
intervention is warranted at this time.  Above findings discussed
with Dr. Brennan at [phone].

## 2012-10-04 IMAGING — CR DG CHEST 1V PORT
1 series · 1 of 1 positions shown · non-contrast
Comparison: 09/03/2011

CLINICAL DATA: Dehydration

PORTABLE CHEST - 1 VIEW

[AP]
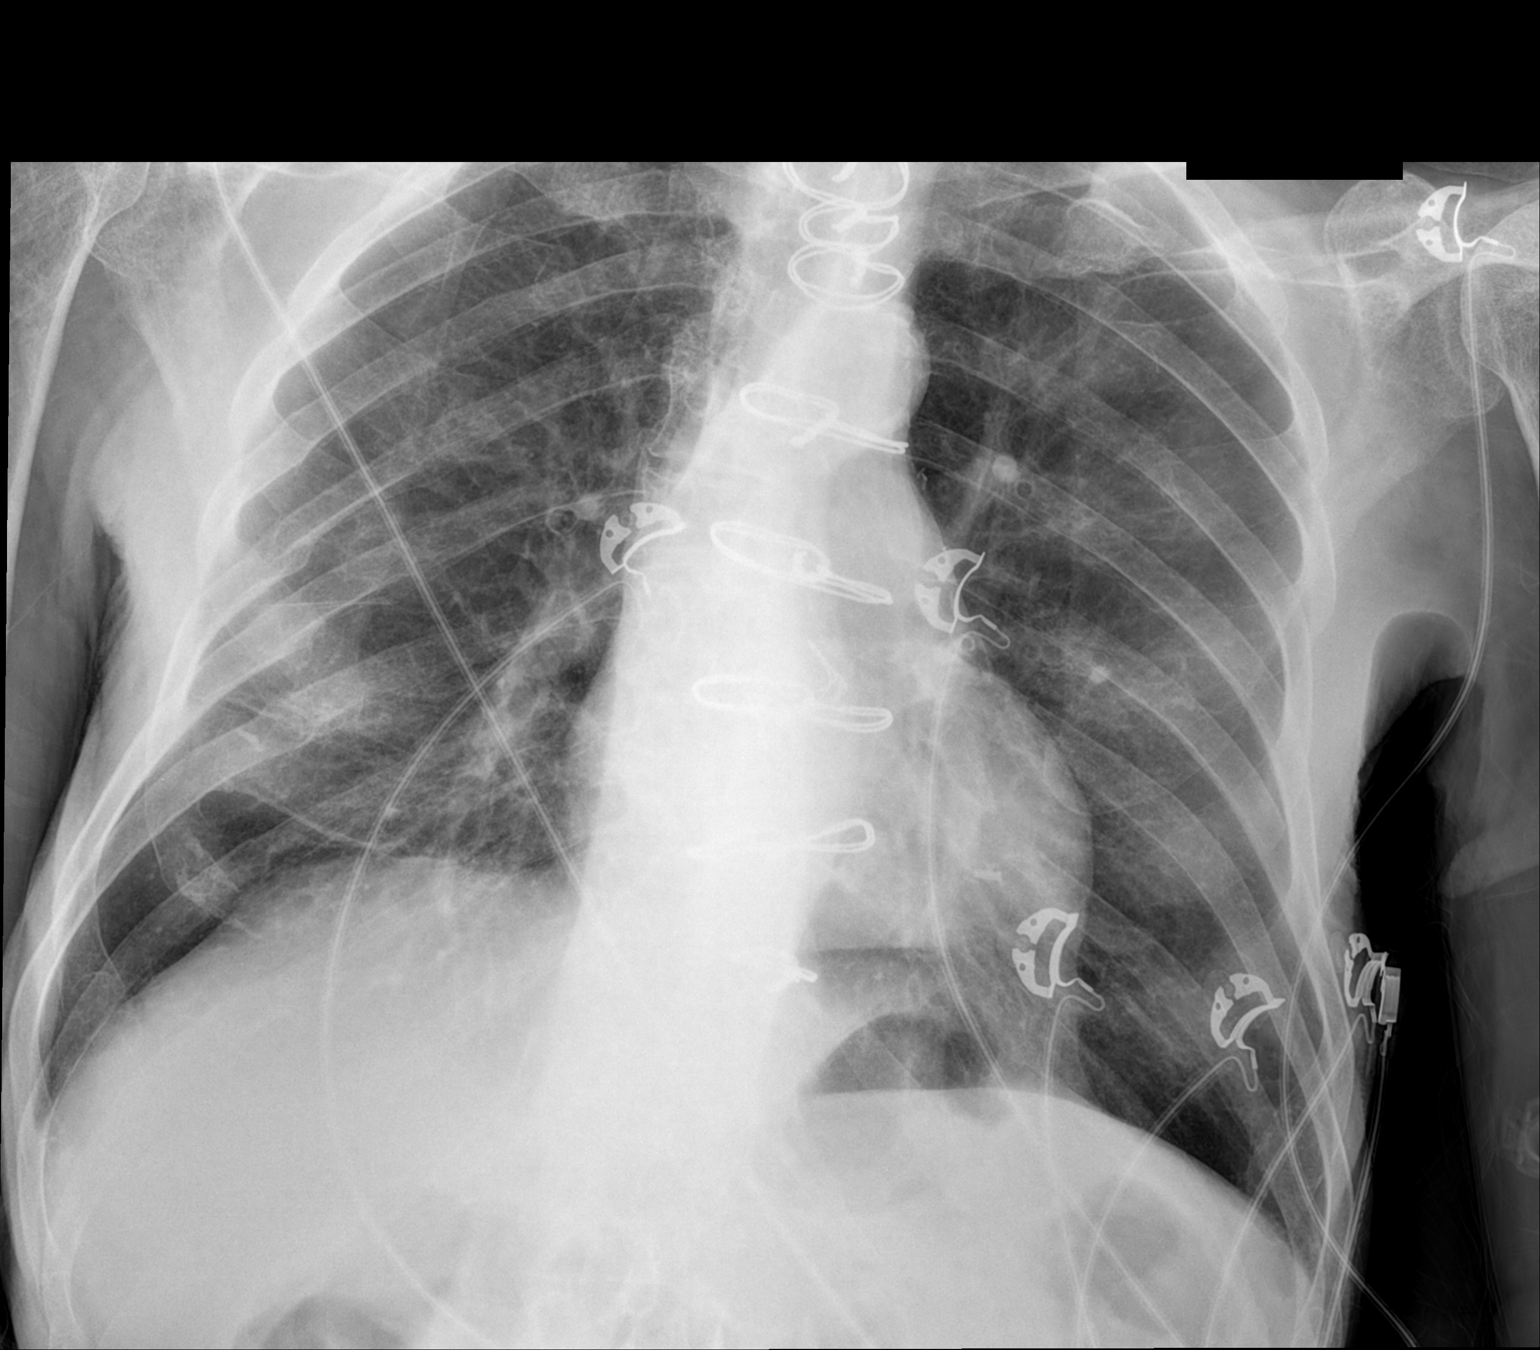

[1 of 1 positions shown; findings below may reference images not displayed]

FINDINGS: Cardiomediastinal silhouette is stable.  Status post
median sternotomy again noted.  No acute infiltrate or pulmonary
edema.  Stable old right rib fractures.
IMPRESSION: No active disease.  Status post median sternotomy.  Stable old
right rib fractures.
